# Patient Record
Sex: Male | Born: 1935 | Race: White | Hispanic: No | Marital: Married | Smoking: Former smoker
Health system: Southern US, Community
[De-identification: ages and names within clinical notes are randomized; demographics above are authoritative.]

## PROBLEM LIST (undated history)

## (undated) DIAGNOSIS — I2510 Atherosclerotic heart disease of native coronary artery without angina pectoris: Secondary | ICD-10-CM

## (undated) DIAGNOSIS — D696 Thrombocytopenia, unspecified: Secondary | ICD-10-CM

## (undated) DIAGNOSIS — J189 Pneumonia, unspecified organism: Secondary | ICD-10-CM

## (undated) DIAGNOSIS — E7800 Pure hypercholesterolemia, unspecified: Secondary | ICD-10-CM

## (undated) DIAGNOSIS — N1832 Chronic kidney disease, stage 3b: Secondary | ICD-10-CM

## (undated) DIAGNOSIS — K219 Gastro-esophageal reflux disease without esophagitis: Secondary | ICD-10-CM

## (undated) DIAGNOSIS — E1165 Type 2 diabetes mellitus with hyperglycemia: Secondary | ICD-10-CM

## (undated) DIAGNOSIS — R2689 Other abnormalities of gait and mobility: Secondary | ICD-10-CM

## (undated) DIAGNOSIS — IMO0001 Reserved for inherently not codable concepts without codable children: Secondary | ICD-10-CM

## (undated) DIAGNOSIS — K2960 Other gastritis without bleeding: Secondary | ICD-10-CM

## (undated) DIAGNOSIS — I10 Essential (primary) hypertension: Secondary | ICD-10-CM

## (undated) DIAGNOSIS — E46 Unspecified protein-calorie malnutrition: Secondary | ICD-10-CM

## (undated) DIAGNOSIS — R062 Wheezing: Secondary | ICD-10-CM

## (undated) DIAGNOSIS — K6389 Other specified diseases of intestine: Secondary | ICD-10-CM

## (undated) DIAGNOSIS — I219 Acute myocardial infarction, unspecified: Secondary | ICD-10-CM

## (undated) DIAGNOSIS — J449 Chronic obstructive pulmonary disease, unspecified: Secondary | ICD-10-CM

## (undated) DIAGNOSIS — E119 Type 2 diabetes mellitus without complications: Secondary | ICD-10-CM

## (undated) HISTORY — DX: Thrombocytopenia, unspecified: D69.6

## (undated) HISTORY — PX: CORONARY ANGIOPLASTY: SHX604

## (undated) HISTORY — PX: APPENDECTOMY: SHX54

## (undated) HISTORY — PX: COLONOSCOPY: SHX174

## (undated) HISTORY — PX: CARDIAC CATHETERIZATION: SHX172

## (undated) HISTORY — DX: Other specified diseases of intestine: K63.89

## (undated) HISTORY — DX: Type 2 diabetes mellitus with hyperosmolarity without nonketotic hyperglycemic-hyperosmolar coma (NKHHC): E11.65

## (undated) HISTORY — DX: Chronic kidney disease, stage 3b: N18.32

## (undated) HISTORY — DX: Other gastritis without bleeding: K29.60

## (undated) HISTORY — DX: Unspecified protein-calorie malnutrition: E46

---

## 2003-12-01 ENCOUNTER — Other Ambulatory Visit: Payer: Self-pay

## 2005-04-30 ENCOUNTER — Emergency Department: Payer: Self-pay | Admitting: General Practice

## 2005-05-01 ENCOUNTER — Emergency Department: Payer: Self-pay | Admitting: Emergency Medicine

## 2005-05-10 ENCOUNTER — Emergency Department: Payer: Self-pay | Admitting: Emergency Medicine

## 2005-05-15 ENCOUNTER — Emergency Department: Payer: Self-pay | Admitting: Emergency Medicine

## 2005-05-25 ENCOUNTER — Emergency Department: Payer: Self-pay | Admitting: Emergency Medicine

## 2007-04-05 ENCOUNTER — Emergency Department: Payer: Self-pay | Admitting: Emergency Medicine

## 2007-07-29 ENCOUNTER — Ambulatory Visit: Payer: Self-pay | Admitting: Gastroenterology

## 2008-09-15 ENCOUNTER — Ambulatory Visit: Payer: Self-pay | Admitting: Gastroenterology

## 2008-11-16 ENCOUNTER — Ambulatory Visit: Payer: Self-pay | Admitting: Gastroenterology

## 2012-10-22 DIAGNOSIS — J449 Chronic obstructive pulmonary disease, unspecified: Secondary | ICD-10-CM | POA: Insufficient documentation

## 2012-10-22 DIAGNOSIS — I10 Essential (primary) hypertension: Secondary | ICD-10-CM | POA: Insufficient documentation

## 2013-01-01 DIAGNOSIS — F1010 Alcohol abuse, uncomplicated: Secondary | ICD-10-CM | POA: Insufficient documentation

## 2015-05-26 DIAGNOSIS — K219 Gastro-esophageal reflux disease without esophagitis: Secondary | ICD-10-CM | POA: Insufficient documentation

## 2015-05-26 DIAGNOSIS — E118 Type 2 diabetes mellitus with unspecified complications: Secondary | ICD-10-CM

## 2015-05-26 DIAGNOSIS — E1129 Type 2 diabetes mellitus with other diabetic kidney complication: Secondary | ICD-10-CM | POA: Insufficient documentation

## 2015-05-26 DIAGNOSIS — IMO0002 Reserved for concepts with insufficient information to code with codable children: Secondary | ICD-10-CM | POA: Insufficient documentation

## 2015-09-28 ENCOUNTER — Encounter: Payer: Self-pay | Admitting: *Deleted

## 2015-10-04 ENCOUNTER — Ambulatory Visit: Payer: Medicare Other | Admitting: Anesthesiology

## 2015-10-04 ENCOUNTER — Encounter: Payer: Self-pay | Admitting: *Deleted

## 2015-10-04 ENCOUNTER — Encounter
Admission: RE | Disposition: A | Discharge status: Home / Self Care | Payer: Self-pay | Source: Ambulatory Visit | Attending: Ophthalmology

## 2015-10-04 ENCOUNTER — Ambulatory Visit
Admission: RE | Admit: 2015-10-04 | Discharge: 2015-10-04 | Disposition: A | Discharge status: Home / Self Care | Payer: Medicare Other | Source: Ambulatory Visit | Attending: Ophthalmology | Admitting: Ophthalmology

## 2015-10-04 DIAGNOSIS — Z79899 Other long term (current) drug therapy: Secondary | ICD-10-CM | POA: Insufficient documentation

## 2015-10-04 DIAGNOSIS — H2511 Age-related nuclear cataract, right eye: Secondary | ICD-10-CM | POA: Insufficient documentation

## 2015-10-04 DIAGNOSIS — E119 Type 2 diabetes mellitus without complications: Secondary | ICD-10-CM | POA: Diagnosis not present

## 2015-10-04 DIAGNOSIS — I252 Old myocardial infarction: Secondary | ICD-10-CM | POA: Diagnosis not present

## 2015-10-04 DIAGNOSIS — Z9889 Other specified postprocedural states: Secondary | ICD-10-CM | POA: Diagnosis not present

## 2015-10-04 DIAGNOSIS — J449 Chronic obstructive pulmonary disease, unspecified: Secondary | ICD-10-CM | POA: Insufficient documentation

## 2015-10-04 DIAGNOSIS — Z955 Presence of coronary angioplasty implant and graft: Secondary | ICD-10-CM | POA: Insufficient documentation

## 2015-10-04 DIAGNOSIS — Z9049 Acquired absence of other specified parts of digestive tract: Secondary | ICD-10-CM | POA: Diagnosis not present

## 2015-10-04 DIAGNOSIS — I10 Essential (primary) hypertension: Secondary | ICD-10-CM | POA: Insufficient documentation

## 2015-10-04 DIAGNOSIS — K219 Gastro-esophageal reflux disease without esophagitis: Secondary | ICD-10-CM | POA: Insufficient documentation

## 2015-10-04 DIAGNOSIS — E7800 Pure hypercholesterolemia, unspecified: Secondary | ICD-10-CM | POA: Insufficient documentation

## 2015-10-04 DIAGNOSIS — H269 Unspecified cataract: Secondary | ICD-10-CM | POA: Diagnosis present

## 2015-10-04 DIAGNOSIS — Z7984 Long term (current) use of oral hypoglycemic drugs: Secondary | ICD-10-CM | POA: Diagnosis not present

## 2015-10-04 DIAGNOSIS — F17200 Nicotine dependence, unspecified, uncomplicated: Secondary | ICD-10-CM | POA: Diagnosis not present

## 2015-10-04 HISTORY — PX: CATARACT EXTRACTION W/PHACO: SHX586

## 2015-10-04 HISTORY — DX: Wheezing: R06.2

## 2015-10-04 HISTORY — DX: Other abnormalities of gait and mobility: R26.89

## 2015-10-04 HISTORY — DX: Chronic obstructive pulmonary disease, unspecified: J44.9

## 2015-10-04 HISTORY — DX: Type 2 diabetes mellitus without complications: E11.9

## 2015-10-04 HISTORY — DX: Acute myocardial infarction, unspecified: I21.9

## 2015-10-04 HISTORY — DX: Atherosclerotic heart disease of native coronary artery without angina pectoris: I25.10

## 2015-10-04 HISTORY — DX: Reserved for inherently not codable concepts without codable children: IMO0001

## 2015-10-04 HISTORY — DX: Gastro-esophageal reflux disease without esophagitis: K21.9

## 2015-10-04 LAB — GLUCOSE, CAPILLARY: Glucose-Capillary: 150 mg/dL — ABNORMAL HIGH (ref 65–99)

## 2015-10-04 SURGERY — PHACOEMULSIFICATION, CATARACT, WITH IOL INSERTION
Anesthesia: Monitor Anesthesia Care | Site: Eye | Laterality: Right | Wound class: Clean

## 2015-10-04 MED ORDER — FENTANYL CITRATE (PF) 100 MCG/2ML IJ SOLN
INTRAMUSCULAR | Status: DC | PRN
  Administered 2015-10-04: 50 ug via INTRAVENOUS

## 2015-10-04 MED ORDER — LIDOCAINE HCL (PF) 1 % IJ SOLN
INTRAOCULAR | Status: DC | PRN
  Administered 2015-10-04: .5 mL via OPHTHALMIC

## 2015-10-04 MED ORDER — CARBACHOL 0.01 % IO SOLN
INTRAOCULAR | Status: DC | PRN
  Administered 2015-10-04: .5 mL via INTRAOCULAR

## 2015-10-04 MED ORDER — MOXIFLOXACIN HCL 0.5 % OP SOLN
OPHTHALMIC | Status: AC
  Filled 2015-10-04: qty 3

## 2015-10-04 MED ORDER — LIDOCAINE HCL (PF) 4 % IJ SOLN
INTRAMUSCULAR | Status: AC
  Filled 2015-10-04: qty 5

## 2015-10-04 MED ORDER — SODIUM CHLORIDE 0.9 % IV SOLN
INTRAVENOUS | Status: DC
  Administered 2015-10-04 (×2): via INTRAVENOUS

## 2015-10-04 MED ORDER — POVIDONE-IODINE 5 % OP SOLN
1.0000 "application " | Freq: Once | OPHTHALMIC | Status: AC
  Administered 2015-10-04: 1 via OPHTHALMIC

## 2015-10-04 MED ORDER — CEFUROXIME OPHTHALMIC INJECTION 1 MG/0.1 ML
INJECTION | OPHTHALMIC | Status: DC | PRN
  Administered 2015-10-04: .1 mL via INTRACAMERAL

## 2015-10-04 MED ORDER — ARMC OPHTHALMIC DILATING GEL
1.0000 "application " | OPHTHALMIC | Status: AC | PRN
  Administered 2015-10-04 (×2): 1 via OPHTHALMIC

## 2015-10-04 MED ORDER — PHENYLEPHRINE HCL 10 % OP SOLN
OPHTHALMIC | Status: AC
  Administered 2015-10-04: 1 [drp] via OPHTHALMIC
  Filled 2015-10-04: qty 5

## 2015-10-04 MED ORDER — ARMC OPHTHALMIC DILATING GEL
OPHTHALMIC | Status: AC
  Administered 2015-10-04: 1 via OPHTHALMIC
  Filled 2015-10-04: qty 0.25

## 2015-10-04 MED ORDER — TETRACAINE HCL 0.5 % OP SOLN: 1.0000 [drp] | Freq: Once | OPHTHALMIC | Status: DC

## 2015-10-04 MED ORDER — EPINEPHRINE HCL 1 MG/ML IJ SOLN
INTRAMUSCULAR | Status: AC
  Filled 2015-10-04: qty 2

## 2015-10-04 MED ORDER — EPINEPHRINE HCL 1 MG/ML IJ SOLN
INTRAOCULAR | Status: DC | PRN
  Administered 2015-10-04: 1 mL via OPHTHALMIC

## 2015-10-04 MED ORDER — MIDAZOLAM HCL 2 MG/2ML IJ SOLN
INTRAMUSCULAR | Status: DC | PRN
  Administered 2015-10-04: 1 mg via INTRAVENOUS

## 2015-10-04 MED ORDER — CEFUROXIME OPHTHALMIC INJECTION 1 MG/0.1 ML
INJECTION | OPHTHALMIC | Status: AC
  Filled 2015-10-04: qty 0.1

## 2015-10-04 MED ORDER — NA CHONDROIT SULF-NA HYALURON 40-17 MG/ML IO SOLN
INTRAOCULAR | Status: DC | PRN
  Administered 2015-10-04: 1 mL via INTRAOCULAR

## 2015-10-04 MED ORDER — NA CHONDROIT SULF-NA HYALURON 40-17 MG/ML IO SOLN
INTRAOCULAR | Status: AC
  Filled 2015-10-04: qty 1

## 2015-10-04 MED ORDER — MOXIFLOXACIN HCL 0.5 % OP SOLN
1.0000 [drp] | OPHTHALMIC | Status: DC | PRN
Start: 2015-10-04 — End: 2015-10-04

## 2015-10-04 MED ORDER — POVIDONE-IODINE 5 % OP SOLN
OPHTHALMIC | Status: AC
  Administered 2015-10-04: 1 via OPHTHALMIC
  Filled 2015-10-04: qty 30

## 2015-10-04 MED ORDER — TETRACAINE HCL 0.5 % OP SOLN
OPHTHALMIC | Status: AC
  Filled 2015-10-04: qty 2

## 2015-10-04 MED ORDER — PHENYLEPHRINE HCL 10 % OP SOLN
1.0000 [drp] | Freq: Once | OPHTHALMIC | Status: AC
  Administered 2015-10-04: 1 [drp] via OPHTHALMIC

## 2015-10-04 MED ORDER — MOXIFLOXACIN HCL 0.5 % OP SOLN
OPHTHALMIC | Status: DC | PRN
  Administered 2015-10-04: 1 [drp] via OPHTHALMIC

## 2015-10-04 SURGICAL SUPPLY — 21 items
CANNULA ANT/CHMB 27GA (MISCELLANEOUS) ×6 IMPLANT
CUP MEDICINE 2OZ PLAST GRAD ST (MISCELLANEOUS) ×6 IMPLANT
GLOVE BIO SURGEON STRL SZ8 (GLOVE) ×3 IMPLANT
GLOVE BIOGEL M 6.5 STRL (GLOVE) ×3 IMPLANT
GLOVE SURG LX 8.0 MICRO (GLOVE) ×2
GLOVE SURG LX STRL 8.0 MICRO (GLOVE) ×1 IMPLANT
GOWN STRL REUS W/ TWL LRG LVL3 (GOWN DISPOSABLE) ×2 IMPLANT
GOWN STRL REUS W/TWL LRG LVL3 (GOWN DISPOSABLE) ×4
LENS IOL TECNIS ITEC 25.0 (Intraocular Lens) ×3 IMPLANT
PACK CATARACT (MISCELLANEOUS) ×3 IMPLANT
PACK CATARACT BRASINGTON LX (MISCELLANEOUS) ×3 IMPLANT
PACK EYE AFTER SURG (MISCELLANEOUS) ×3 IMPLANT
SOL BSS BAG (MISCELLANEOUS) ×3
SOL PREP PVP 2OZ (MISCELLANEOUS) ×3
SOLUTION BSS BAG (MISCELLANEOUS) ×1 IMPLANT
SOLUTION PREP PVP 2OZ (MISCELLANEOUS) ×1 IMPLANT
SYR 3ML LL SCALE MARK (SYRINGE) ×6 IMPLANT
SYR 5ML LL (SYRINGE) ×3 IMPLANT
SYR TB 1ML 27GX1/2 LL (SYRINGE) ×3 IMPLANT
WATER STERILE IRR 1000ML POUR (IV SOLUTION) ×3 IMPLANT
WIPE NON LINTING 3.25X3.25 (MISCELLANEOUS) ×3 IMPLANT

## 2015-10-04 NOTE — Anesthesia Postprocedure Evaluation (Signed)
Anesthesia Post Note  Patient: Micheal [name]  Procedure(s) Performed: Procedure(s) (LRB): CATARACT EXTRACTION PHACO AND INTRAOCULAR LENS PLACEMENT (IOC) (Right)  Patient location during evaluation: PACU Anesthesia Type: MAC Level of consciousness: awake, awake and alert and oriented Pain management: pain level controlled Vital Signs Assessment: post-procedure vital signs reviewed and stable Respiratory status: spontaneous breathing, nonlabored ventilation and respiratory function stable Cardiovascular status: blood pressure returned to baseline Anesthetic complications: no    Last Vitals:  Filed Vitals:   10/04/15 1047  BP: 153/58  Pulse: 72  Temp: 36.4 C  Resp: 18    Last Pain: There were no vitals filed for this visit.               Anysa Tacey,  [employer]

## 2015-10-04 NOTE — Anesthesia Preprocedure Evaluation (Addendum)
Anesthesia Evaluation  Patient identified by MRN, date of birth, ID band Patient awake    Reviewed: Allergy & Precautions, NPO status , Patient's Chart, lab work & pertinent test results, reviewed documented beta blocker date and time   Airway Mallampati: II  TM Distance: >3 FB     Dental  (+) Chipped, Upper Dentures, Partial Lower, Poor Dentition, Dental Advisory Given   Pulmonary shortness of breath, COPD, Current Smoker,           Cardiovascular + CAD, + Past MI and + Cardiac Stents       Neuro/Psych    GI/Hepatic GERD  Controlled,  Endo/Other  diabetes, Type 2  Renal/GU      Musculoskeletal   Abdominal   Peds  Hematology   Anesthesia Other Findings   Reproductive/Obstetrics                            Anesthesia Physical Anesthesia Plan  ASA: III  Anesthesia Plan: MAC   Post-op Pain Management:    Induction:   Airway Management Planned:   Additional Equipment:   Intra-op Plan:   Post-operative Plan:   Informed Consent: I have reviewed the patients History and Physical, chart, labs and discussed the procedure including the risks, benefits and alternatives for the proposed anesthesia with the patient or authorized representative who has indicated his/her understanding and acceptance.     Plan Discussed with: CRNA  Anesthesia Plan Comments:         Anesthesia Quick Evaluation

## 2015-10-04 NOTE — Discharge Instructions (Signed)
Eye Surgery Discharge Instructions  Expect mild scratchy sensation or mild soreness. DO NOT RUB YOUR EYE!  The day of surgery:  Minimal physical activity, but bed rest is not required  No reading, computer work, or close hand work  No bending, lifting, or straining.  May watch TV  For 24 hours:  No driving, legal decisions, or alcoholic beverages  Safety precautions  Eat anything you prefer: It is better to start with liquids, then soup then solid foods.  _____ Eye patch should be worn until postoperative exam tomorrow.  ____ Solar shield eyeglasses should be worn for comfort in the sunlight/patch while sleeping  Resume all regular medications including aspirin or Coumadin if these were discontinued prior to surgery. You may shower, bathe, shave, or wash your hair. Tylenol may be taken for mild discomfort.  Call your doctor if you experience significant pain, nausea, or vomiting, fever > 101 or other signs of infection. [phone] or [phone] Specific instructions:  Follow-up Information    Follow up with Tim Lair, MD.   Specialty:  Ophthalmology   Why:  10-05-15 at 9:20   Contact information:   [address] [place] [address] [phone]      Eye Surgery Discharge Instructions  Expect mild scratchy sensation or mild soreness. DO NOT RUB YOUR EYE!  The day of surgery:  Minimal physical activity, but bed rest is not required  No reading, computer work, or close hand work  No bending, lifting, or straining.  May watch TV  For 24 hours:  No driving, legal decisions, or alcoholic beverages  Safety precautions  Eat anything you prefer: It is better to start with liquids, then soup then solid foods.  _____ Eye patch should be worn until postoperative exam tomorrow.  ____ Solar shield eyeglasses should be worn for comfort in the sunlight/patch while sleeping  Resume all regular medications including aspirin or Coumadin if  these were discontinued prior to surgery. You may shower, bathe, shave, or wash your hair. Tylenol may be taken for mild discomfort.  Call your doctor if you experience significant pain, nausea, or vomiting, fever > 101 or other signs of infection. [phone] or [phone] Specific instructions:  Follow-up Information    Follow up with Tim Lair, MD.   Specialty:  Ophthalmology   Why:  10-05-15 at 9:20   Contact information:   [address] [place] [address] [phone]

## 2015-10-04 NOTE — Op Note (Signed)
PREOPERATIVE DIAGNOSIS:  Nuclear sclerotic cataract of the right eye.   POSTOPERATIVE DIAGNOSIS: nuclear sclerotic cataract right eye   OPERATIVE PROCEDURE:  Procedure(s): CATARACT EXTRACTION PHACO AND INTRAOCULAR LENS PLACEMENT (IOC)   SURGEON:  [name], MD.   ANESTHESIA:  Anesthesiologist: Gunnar Bulla, MD CRNA: Nelda Marseille, CRNA  1.      Managed anesthesia care. 2.      Topical tetracaine drops followed by 2% Xylocaine jelly applied in the preoperative holding area.       3.  0.2 ml of epi-Shugarcaine was  placed in the anterior chamber following the paracentesis.    COMPLICATIONS:  None.   TECHNIQUE:   Stop and chop   DESCRIPTION OF PROCEDURE:  The patient was examined and consented in the preoperative holding area where the aforementioned topical anesthesia was applied to the right eye and then brought back to the Operating Room where the right eye was prepped and draped in the usual sterile ophthalmic fashion and a lid speculum was placed. A paracentesis was created with the side port blade and the anterior chamber was filled with viscoelastic. A near clear corneal incision was performed with the steel keratome. A continuous curvilinear capsulorrhexis was performed with a cystotome followed by the capsulorrhexis forceps. Hydrodissection and hydrodelineation were carried out with BSS on a blunt cannula. The lens was removed in a stop and chop  technique and the remaining cortical material was removed with the irrigation-aspiration handpiece. The capsular bag was inflated with viscoelastic and the Technis ZCB00  lens was placed in the capsular bag without complication. The remaining viscoelastic was removed from the eye with the irrigation-aspiration handpiece. The wounds were hydrated. The anterior chamber was flushed with Miostat and the eye was inflated to physiologic pressure. 0.1 mL of cefuroxime concentration 10 mg/mL was placed in the anterior chamber. The wounds were found  to be water tight. The eye was dressed with Vigamox. The patient was given protective glasses to wear throughout the day and a shield with which to sleep tonight. The patient was also given drops with which to begin a drop regimen today and will follow-up with me in one day.  Implant Name Type Inv. Item Serial No. Manufacturer Lot No. LRB No. Used  LENS IOL DIOP 25.0 - [id number] Intraocular Lens LENS IOL DIOP 25.0 [id number]   Right 1   Procedure(s) with comments: CATARACT EXTRACTION PHACO AND INTRAOCULAR LENS PLACEMENT (IOC) (Right) - [place] 02:06 AP% 17.9 CDE 22.65 fluid pack lot # [id number]  Electronically signed: [place] 10/04/2015 12:02 PM

## 2015-10-04 NOTE — Transfer of Care (Signed)
Immediate Anesthesia Transfer of Care Note  Patient: Micheal [name]  Procedure(s) Performed: Procedure(s) with comments: CATARACT EXTRACTION PHACO AND INTRAOCULAR LENS PLACEMENT (IOC) (Right) - [place] 02:06 AP% 17.9 CDE 22.65 fluid pack lot # [id number]  Patient Location: PACU  Anesthesia Type:MAC  Level of Consciousness: awake, alert  and oriented  Airway & Oxygen Therapy: Patient Spontanous Breathing  Post-op Assessment: Report given to RN and Post -op Vital signs reviewed and stable  Post vital signs: Reviewed and stable  Last Vitals:  Filed Vitals:   10/04/15 1047  BP: 153/58  Pulse: 72  Temp: 36.4 C  Resp: 18    Last Pain: There were no vitals filed for this visit.       Complications: No apparent anesthesia complications

## 2015-10-04 NOTE — Anesthesia Procedure Notes (Signed)
Procedure Name: MAC Date/Time: 10/04/2015 11:35 AM Performed by: Nelda Marseille Pre-anesthesia Checklist: Patient identified, Emergency Drugs available, Suction available, Patient being monitored and Timeout performed Oxygen Delivery Method: Nasal cannula

## 2015-10-04 NOTE — H&P (Signed)
  All labs reviewed. Abnormal studies sent to patients PCP when indicated.  Previous H&P reviewed, patient examined, there are NO CHANGES.  Micheal [name] LOUIS5/16/201711:04 AM

## 2015-11-08 ENCOUNTER — Encounter: Admission: RE | Payer: Self-pay | Source: Ambulatory Visit

## 2015-11-08 ENCOUNTER — Ambulatory Visit: Admission: RE | Admit: 2015-11-08 | Payer: Medicare Other | Source: Ambulatory Visit | Admitting: Ophthalmology

## 2015-11-08 HISTORY — DX: Essential (primary) hypertension: I10

## 2015-11-08 HISTORY — DX: Pure hypercholesterolemia, unspecified: E78.00

## 2015-11-08 SURGERY — PHACOEMULSIFICATION, CATARACT, WITH IOL INSERTION
Anesthesia: Choice | Laterality: Left

## 2015-12-05 ENCOUNTER — Encounter: Payer: Self-pay | Admitting: *Deleted

## 2015-12-13 ENCOUNTER — Encounter
Admission: RE | Disposition: A | Discharge status: Home / Self Care | Payer: Self-pay | Source: Ambulatory Visit | Attending: Ophthalmology

## 2015-12-13 ENCOUNTER — Encounter: Payer: Self-pay | Admitting: *Deleted

## 2015-12-13 ENCOUNTER — Ambulatory Visit
Admission: RE | Admit: 2015-12-13 | Discharge: 2015-12-13 | Disposition: A | Discharge status: Home / Self Care | Payer: Medicare Other | Source: Ambulatory Visit | Attending: Ophthalmology | Admitting: Ophthalmology

## 2015-12-13 ENCOUNTER — Ambulatory Visit: Payer: Medicare Other | Admitting: Certified Registered"

## 2015-12-13 DIAGNOSIS — E119 Type 2 diabetes mellitus without complications: Secondary | ICD-10-CM | POA: Insufficient documentation

## 2015-12-13 DIAGNOSIS — Z87891 Personal history of nicotine dependence: Secondary | ICD-10-CM | POA: Diagnosis not present

## 2015-12-13 DIAGNOSIS — J449 Chronic obstructive pulmonary disease, unspecified: Secondary | ICD-10-CM | POA: Diagnosis not present

## 2015-12-13 DIAGNOSIS — I252 Old myocardial infarction: Secondary | ICD-10-CM | POA: Diagnosis not present

## 2015-12-13 DIAGNOSIS — H2512 Age-related nuclear cataract, left eye: Secondary | ICD-10-CM | POA: Insufficient documentation

## 2015-12-13 DIAGNOSIS — I10 Essential (primary) hypertension: Secondary | ICD-10-CM | POA: Insufficient documentation

## 2015-12-13 DIAGNOSIS — Z955 Presence of coronary angioplasty implant and graft: Secondary | ICD-10-CM | POA: Insufficient documentation

## 2015-12-13 DIAGNOSIS — K219 Gastro-esophageal reflux disease without esophagitis: Secondary | ICD-10-CM | POA: Diagnosis not present

## 2015-12-13 HISTORY — PX: CATARACT EXTRACTION W/PHACO: SHX586

## 2015-12-13 HISTORY — DX: Pneumonia, unspecified organism: J18.9

## 2015-12-13 LAB — GLUCOSE, CAPILLARY: GLUCOSE-CAPILLARY: 192 mg/dL — AB (ref 65–99)

## 2015-12-13 SURGERY — PHACOEMULSIFICATION, CATARACT, WITH IOL INSERTION
Anesthesia: Monitor Anesthesia Care | Site: Eye | Laterality: Left | Wound class: Clean

## 2015-12-13 MED ORDER — NA CHONDROIT SULF-NA HYALURON 40-17 MG/ML IO SOLN
INTRAOCULAR | Status: AC
  Filled 2015-12-13: qty 1

## 2015-12-13 MED ORDER — SODIUM CHLORIDE 0.9 % IV SOLN
INTRAVENOUS | Status: DC
  Administered 2015-12-13: 15:00:00 via INTRAVENOUS

## 2015-12-13 MED ORDER — BSS IO SOLN
INTRAOCULAR | Status: DC | PRN
  Administered 2015-12-13: 16:00:00 via OPHTHALMIC

## 2015-12-13 MED ORDER — ARMC OPHTHALMIC DILATING GEL
OPHTHALMIC | Status: AC
  Filled 2015-12-13: qty 0.25

## 2015-12-13 MED ORDER — NA CHONDROIT SULF-NA HYALURON 40-17 MG/ML IO SOLN
INTRAOCULAR | Status: DC | PRN
  Administered 2015-12-13: 1 mL via INTRAOCULAR

## 2015-12-13 MED ORDER — EPINEPHRINE HCL 1 MG/ML IJ SOLN
INTRAMUSCULAR | Status: AC
  Filled 2015-12-13: qty 1

## 2015-12-13 MED ORDER — TETRACAINE HCL 0.5 % OP SOLN
1.0000 [drp] | OPHTHALMIC | Status: AC | PRN
  Administered 2015-12-13: 1 [drp] via OPHTHALMIC

## 2015-12-13 MED ORDER — LIDOCAINE HCL (PF) 4 % IJ SOLN
INTRAMUSCULAR | Status: AC
  Filled 2015-12-13: qty 10

## 2015-12-13 MED ORDER — ARMC OPHTHALMIC DILATING GEL
1.0000 "application " | OPHTHALMIC | Status: AC | PRN
  Administered 2015-12-13 (×2): 1 via OPHTHALMIC

## 2015-12-13 MED ORDER — POVIDONE-IODINE 5 % OP SOLN
1.0000 "application " | OPHTHALMIC | Status: AC | PRN
  Administered 2015-12-13: 1 via OPHTHALMIC

## 2015-12-13 MED ORDER — CARBACHOL 0.01 % IO SOLN
INTRAOCULAR | Status: DC | PRN
  Administered 2015-12-13: 0.5 mL via INTRAOCULAR

## 2015-12-13 MED ORDER — MOXIFLOXACIN HCL 0.5 % OP SOLN
OPHTHALMIC | Status: AC
  Filled 2015-12-13: qty 3

## 2015-12-13 MED ORDER — TETRACAINE HCL 0.5 % OP SOLN
OPHTHALMIC | Status: AC
  Filled 2015-12-13: qty 2

## 2015-12-13 MED ORDER — FENTANYL CITRATE (PF) 100 MCG/2ML IJ SOLN: 25.0000 ug | INTRAMUSCULAR | Status: DC | PRN

## 2015-12-13 MED ORDER — MIDAZOLAM HCL 2 MG/2ML IJ SOLN
INTRAMUSCULAR | Status: DC | PRN
  Administered 2015-12-13: 1 mg via INTRAVENOUS

## 2015-12-13 MED ORDER — POVIDONE-IODINE 5 % OP SOLN
OPHTHALMIC | Status: AC
  Filled 2015-12-13: qty 30

## 2015-12-13 MED ORDER — ONDANSETRON HCL 4 MG/2ML IJ SOLN: 4.0000 mg | Freq: Once | INTRAMUSCULAR | Status: DC | PRN

## 2015-12-13 MED ORDER — MOXIFLOXACIN HCL 0.5 % OP SOLN
1.0000 [drp] | OPHTHALMIC | Status: AC | PRN
  Administered 2015-12-13: 1 [drp] via OPHTHALMIC
  Filled 2015-12-13: qty 3

## 2015-12-13 MED ORDER — EPINEPHRINE HCL 1 MG/ML IJ SOLN
INTRAMUSCULAR | Status: AC
  Filled 2015-12-13: qty 2

## 2015-12-13 MED ORDER — CEFUROXIME OPHTHALMIC INJECTION 1 MG/0.1 ML
INJECTION | OPHTHALMIC | Status: DC | PRN
  Administered 2015-12-13: 0.1 mL via INTRACAMERAL

## 2015-12-13 MED ORDER — SODIUM CHLORIDE 0.9 % IV SOLN: INTRAVENOUS | Status: DC

## 2015-12-13 MED ORDER — CEFUROXIME OPHTHALMIC INJECTION 1 MG/0.1 ML
INJECTION | OPHTHALMIC | Status: AC
  Filled 2015-12-13: qty 0.1

## 2015-12-13 SURGICAL SUPPLY — 23 items
CANNULA ANT/CHMB 27GA (MISCELLANEOUS) ×3 IMPLANT
CUP MEDICINE 2OZ PLAST GRAD ST (MISCELLANEOUS) ×3 IMPLANT
GLOVE BIO SURGEON STRL SZ8 (GLOVE) ×3 IMPLANT
GLOVE BIOGEL M 6.5 STRL (GLOVE) ×3 IMPLANT
GLOVE SURG LX 8.0 MICRO (GLOVE) ×2
GLOVE SURG LX STRL 8.0 MICRO (GLOVE) ×1 IMPLANT
GOWN STRL REUS W/ TWL LRG LVL3 (GOWN DISPOSABLE) ×2 IMPLANT
GOWN STRL REUS W/TWL LRG LVL3 (GOWN DISPOSABLE) ×4
LENS IOL ACRSF IQ ULTRA 24.0 (Intraocular Lens) ×1 IMPLANT
LENS IOL ACRYSOF IQ 24.0 (Intraocular Lens) ×3 IMPLANT
PACK CATARACT (MISCELLANEOUS) ×3 IMPLANT
PACK CATARACT BRASINGTON LX (MISCELLANEOUS) ×3 IMPLANT
PACK EYE AFTER SURG (MISCELLANEOUS) ×3 IMPLANT
RING MALYGIN (MISCELLANEOUS) ×3 IMPLANT
SOL BSS BAG (MISCELLANEOUS) ×3
SOL PREP PVP 2OZ (MISCELLANEOUS) ×3
SOLUTION BSS BAG (MISCELLANEOUS) ×1 IMPLANT
SOLUTION PREP PVP 2OZ (MISCELLANEOUS) ×1 IMPLANT
SYR 3ML LL SCALE MARK (SYRINGE) ×3 IMPLANT
SYR 5ML LL (SYRINGE) ×3 IMPLANT
SYR TB 1ML 27GX1/2 LL (SYRINGE) ×3 IMPLANT
WATER STERILE IRR 1000ML POUR (IV SOLUTION) ×3 IMPLANT
WIPE NON LINTING 3.25X3.25 (MISCELLANEOUS) ×3 IMPLANT

## 2015-12-13 NOTE — H&P (Signed)
  All labs reviewed. Abnormal studies sent to patients PCP when indicated.  Previous H&P reviewed, patient examined, there are NO CHANGES.  Micheal [name] LOUIS7/25/20174:12 PM

## 2015-12-13 NOTE — Op Note (Signed)
PREOPERATIVE DIAGNOSIS:  Nuclear sclerotic cataract of the left eye.   POSTOPERATIVE DIAGNOSIS:  NUCLEAR SCLERTOIC CATARACT LEFT EYE   OPERATIVE PROCEDURE:  Procedure(s): CATARACT EXTRACTION PHACO AND INTRAOCULAR LENS PLACEMENT (IOC)   SURGEON:  [name], MD.   ANESTHESIA:   Anesthesiologist: Gijsbertus Lonia Mad, MD CRNA: Rolla Plate, CRNA; Nelda Marseille, CRNA  1.      Managed anesthesia care. 2.      Topical tetracaine drops followed by 2% Xylocaine jelly applied in the preoperative holding area.   COMPLICATIONS: Viscoelastic was used to raise the pupil margin.  A  Malyugin ring was placed as the pupil would not achieve sufficient pharmacologic dilation to undergo cataract extraction safely.( The ring was removed atraumatically following insertion of the IOL.)   TECHNIQUE:   Stop and chop   DESCRIPTION OF PROCEDURE:  The patient was examined and consented in the preoperative holding area where the aforementioned topical anesthesia was applied to the left eye and then brought back to the Operating Room where the left eye was prepped and draped in the usual sterile ophthalmic fashion and a lid speculum was placed. A paracentesis was created with the side port blade and the anterior chamber was filled with viscoelastic. A near clear corneal incision was performed with the steel keratome. A continuous curvilinear capsulorrhexis was performed with a cystotome followed by the capsulorrhexis forceps. Hydrodissection and hydrodelineation were carried out with BSS on a blunt cannula. The lens was removed in a stop and chop  technique and the remaining cortical material was removed with the irrigation-aspiration handpiece. The capsular bag was inflated with viscoelastic and the Technis [id number] lens was placed in the capsular bag without complication. The remaining viscoelastic was removed from the eye with the irrigation-aspiration handpiece. The wounds were hydrated. The anterior chamber was  flushed with Miostat and the eye was inflated to physiologic pressure. 0.1 mL of cefuroxime concentration 10 mg/mL was placed in the anterior chamber. The wounds were found to be water tight. The eye was dressed with Vigamox. The patient was given protective glasses to wear throughout the day and a shield with which to sleep tonight. The patient was also given drops with which to begin a drop regimen today and will follow-up with me in one day.  Implant Name Type Inv. Item Serial No. Manufacturer Lot No. LRB No. Used  LENS IOL ACRYSOF IQ 24.0 - [id number] Intraocular Lens LENS IOL ACRYSOF IQ 24.80 [id number] ALCON   Left 1   Procedure(s) with comments: CATARACT EXTRACTION PHACO AND INTRAOCULAR LENS PLACEMENT (IOC) (Left) - [place] 1.39 AP% 25.3 CDE 26.37 Fluid pack lot # [id number]  Electronically signed: [place] 12/13/2015 4:44 PM

## 2015-12-13 NOTE — Anesthesia Postprocedure Evaluation (Signed)
Anesthesia Post Note  Patient: Micheal [name]  Procedure(s) Performed: Procedure(s) (LRB): CATARACT EXTRACTION PHACO AND INTRAOCULAR LENS PLACEMENT (IOC) (Left)  Patient location during evaluation: Short Stay Anesthesia Type: MAC Level of consciousness: awake Pain management: pain level controlled Vital Signs Assessment: post-procedure vital signs reviewed and stable Respiratory status: spontaneous breathing Cardiovascular status: blood pressure returned to baseline Postop Assessment: no headache Anesthetic complications: no    Last Vitals:  Vitals:   12/13/15 1427 12/13/15 1646  BP: 92/69 129/61  Pulse: 84 73  Resp: 16 18  Temp: 36.7 C 36.6 C    Last Pain:  Vitals:   12/13/15 1427  TempSrc: Oral                 Brantley Fling

## 2015-12-13 NOTE — Anesthesia Preprocedure Evaluation (Signed)
Anesthesia Evaluation  Patient identified by MRN, date of birth, ID band Patient awake    Reviewed: Allergy & Precautions, NPO status , Patient's Chart, lab work & pertinent test results  Airway Mallampati: II       Dental  (+) Caps   Pulmonary COPD, former smoker,     + decreased breath sounds      Cardiovascular Exercise Tolerance: Good hypertension, + Past MI and + Cardiac Stents   Rhythm:Regular     Neuro/Psych negative neurological ROS     GI/Hepatic Neg liver ROS, GERD  ,  Endo/Other  diabetes, Type 2, Oral Hypoglycemic Agents  Renal/GU negative Renal ROS     Musculoskeletal   Abdominal Normal abdominal exam  (+)   Peds  Hematology   Anesthesia Other Findings   Reproductive/Obstetrics                             Anesthesia Physical Anesthesia Plan  ASA: III  Anesthesia Plan: MAC   Post-op Pain Management:    Induction: Intravenous  Airway Management Planned: Natural Airway and Nasal Cannula  Additional Equipment:   Intra-op Plan:   Post-operative Plan:   Informed Consent: I have reviewed the patients History and Physical, chart, labs and discussed the procedure including the risks, benefits and alternatives for the proposed anesthesia with the patient or authorized representative who has indicated his/her understanding and acceptance.     Plan Discussed with: CRNA  Anesthesia Plan Comments:         Anesthesia Quick Evaluation

## 2015-12-13 NOTE — OR Nursing (Signed)
Iv dc'd with cath intact.  No redness

## 2015-12-13 NOTE — Transfer of Care (Signed)
Immediate Anesthesia Transfer of Care Note  Patient: Micheal [name]  Procedure(s) Performed: Procedure(s) with comments: CATARACT EXTRACTION PHACO AND INTRAOCULAR LENS PLACEMENT (IOC) (Left) - [place] 1.39 AP% 25.3 CDE 26.37 Fluid pack lot # [id number]  Patient Location: Short Stay  Anesthesia Type:MAC  Level of Consciousness: awake  Airway & Oxygen Therapy: Patient Spontanous Breathing  Post-op Assessment: Report given to RN  Post vital signs: Reviewed  Last Vitals:  Vitals:   12/13/15 1427 12/13/15 1646  BP: 92/69 129/61  Pulse: 84 73  Resp: 16 18  Temp: 36.7 C 36.6 C    Last Pain:  Vitals:   12/13/15 1427  TempSrc: Oral         Complications: No apparent anesthesia complications

## 2015-12-13 NOTE — Discharge Instructions (Signed)

## 2015-12-13 NOTE — OR Nursing (Signed)
Iv in right hand with out redness or drainage.  Infusing NS

## 2015-12-15 ENCOUNTER — Encounter: Payer: Self-pay | Admitting: Ophthalmology

## 2016-05-05 ENCOUNTER — Emergency Department: Payer: Medicare Other

## 2016-05-05 ENCOUNTER — Emergency Department
Admission: EM | Admit: 2016-05-05 | Discharge: 2016-05-05 | Disposition: A | Discharge status: Home / Self Care | Payer: Medicare Other | Attending: Student in an Organized Health Care Education/Training Program | Admitting: Student in an Organized Health Care Education/Training Program

## 2016-05-05 DIAGNOSIS — S60512A Abrasion of left hand, initial encounter: Secondary | ICD-10-CM | POA: Diagnosis not present

## 2016-05-05 DIAGNOSIS — J449 Chronic obstructive pulmonary disease, unspecified: Secondary | ICD-10-CM | POA: Diagnosis not present

## 2016-05-05 DIAGNOSIS — S022XXA Fracture of nasal bones, initial encounter for closed fracture: Secondary | ICD-10-CM | POA: Diagnosis not present

## 2016-05-05 DIAGNOSIS — I2510 Atherosclerotic heart disease of native coronary artery without angina pectoris: Secondary | ICD-10-CM | POA: Diagnosis not present

## 2016-05-05 DIAGNOSIS — W010XXA Fall on same level from slipping, tripping and stumbling without subsequent striking against object, initial encounter: Secondary | ICD-10-CM | POA: Diagnosis not present

## 2016-05-05 DIAGNOSIS — S0992XA Unspecified injury of nose, initial encounter: Secondary | ICD-10-CM | POA: Diagnosis present

## 2016-05-05 DIAGNOSIS — S161XXA Strain of muscle, fascia and tendon at neck level, initial encounter: Secondary | ICD-10-CM | POA: Diagnosis not present

## 2016-05-05 DIAGNOSIS — Y939 Activity, unspecified: Secondary | ICD-10-CM | POA: Diagnosis not present

## 2016-05-05 DIAGNOSIS — W19XXXA Unspecified fall, initial encounter: Secondary | ICD-10-CM

## 2016-05-05 DIAGNOSIS — S0083XA Contusion of other part of head, initial encounter: Secondary | ICD-10-CM | POA: Diagnosis not present

## 2016-05-05 DIAGNOSIS — Z7984 Long term (current) use of oral hypoglycemic drugs: Secondary | ICD-10-CM | POA: Insufficient documentation

## 2016-05-05 DIAGNOSIS — Y999 Unspecified external cause status: Secondary | ICD-10-CM | POA: Diagnosis not present

## 2016-05-05 DIAGNOSIS — Z87891 Personal history of nicotine dependence: Secondary | ICD-10-CM | POA: Insufficient documentation

## 2016-05-05 DIAGNOSIS — T07XXXA Unspecified multiple injuries, initial encounter: Secondary | ICD-10-CM

## 2016-05-05 DIAGNOSIS — I10 Essential (primary) hypertension: Secondary | ICD-10-CM | POA: Insufficient documentation

## 2016-05-05 DIAGNOSIS — S40811A Abrasion of right upper arm, initial encounter: Secondary | ICD-10-CM | POA: Insufficient documentation

## 2016-05-05 DIAGNOSIS — E119 Type 2 diabetes mellitus without complications: Secondary | ICD-10-CM | POA: Diagnosis not present

## 2016-05-05 DIAGNOSIS — Y929 Unspecified place or not applicable: Secondary | ICD-10-CM | POA: Insufficient documentation

## 2016-05-05 MED ORDER — HYDROCODONE-ACETAMINOPHEN 5-325 MG PO TABS
1.0000 | ORAL_TABLET | Freq: Once | ORAL | Status: AC
Start: 2016-05-05 — End: 2016-05-05
  Administered 2016-05-05: 1 via ORAL
  Filled 2016-05-05: qty 1

## 2016-05-05 MED ORDER — HYDROCODONE-ACETAMINOPHEN 5-325 MG PO TABS: 1.0000 | ORAL_TABLET | ORAL | 0 refills | Status: DC | PRN

## 2016-05-05 NOTE — Discharge Instructions (Signed)
Follow-up with [facility] for your fractured nose. You will experience a lot of swelling in the next several days. You may apply ice packs as needed for swelling. Keep abrasions clean and dry. Watch for signs of infection such as redness or pus. Norco as needed for severe pain.

## 2016-05-05 NOTE — ED Provider Notes (Signed)
[facility] Emergency Department Provider Note   ____________________________________________   First MD Initiated Contact with Patient 05/05/16 1840     (approximate)  I have reviewed the triage vital signs and the nursing notes.   HISTORY  Chief Complaint Fall   HPI Micheal [name] is a 82 y.o. male was in the emergency room today with complaint of nosebleed after falling. Patient states that he tripped and fell outside. He tried to use his forearm to brace his fall. He denies any dizziness or loss of consciousness. He states that he did break his dentures. He also has a nosebleed and swelling to the bridge of his nose. Patient states that he does not take any blood thinners but tries to take an aspirin on a daily basis. Currently he complains of nasal pain and facial pain and neck pain. He denies any pain to his extremities. Patient has continued to be ambulatory without assistance since his fall. Patient rates his pain as a 5 out of 10.   Past Medical History:  Diagnosis Date  . Balance problem    INTERMITTENT WITH NEGATIVE WORKUP  . COPD (chronic obstructive pulmonary disease) ([place])   . Coronary artery disease   . Diabetes mellitus without complication ([place])   . GERD (gastroesophageal reflux disease)   . Hypercholesteremia   . Hypertension   . Myocardial infarction    2005  . Pneumonia    RECENT 5/17  . Shortness of breath dyspnea   . Wheezing     There are no active problems to display for this patient.   Past Surgical History:  Procedure Laterality Date  . APPENDECTOMY    . CARDIAC CATHETERIZATION    . CATARACT EXTRACTION W/PHACO Right 10/04/2015   Procedure: CATARACT EXTRACTION PHACO AND INTRAOCULAR LENS PLACEMENT (IOC);  Surgeon: [name], MD;  Location: [facility] ORS;  Service: Ophthalmology;  Laterality: Right;  [place] 02:06 AP% 17.9 CDE 22.65 fluid pack lot # [id number]  . CATARACT EXTRACTION W/PHACO Left 12/13/2015   Procedure:  CATARACT EXTRACTION PHACO AND INTRAOCULAR LENS PLACEMENT ([place]);  Surgeon: [name], MD;  Location: [facility] ORS;  Service: Ophthalmology;  Laterality: Left;  [place] 1.39 AP% 25.3 CDE 26.37 Fluid pack lot # [id number]  . COLONOSCOPY    . CORONARY ANGIOPLASTY     Stent placement    Prior to Admission medications   Medication Sig Start Date End Date Taking? Authorizing Provider  ATORVASTATIN CALCIUM PO Take by mouth.    Historical Provider, MD  carvedilol (COREG) 6.25 MG tablet Take 6.25 mg by mouth 2 (two) times daily with a meal.    Historical Provider, MD  Erythromycin Base ([place] PO) Take by mouth.    Historical Provider, MD  glipiZIDE (GLUCOTROL XL) 2.5 MG 24 hr tablet Take 2.5 mg by mouth daily with breakfast.    Historical Provider, MD  HYDROcodone-acetaminophen (NORCO/VICODIN) 5-325 MG tablet Take 1 tablet by mouth every 4 (four) hours as needed for moderate pain. 05/05/16   Johnn Hai, PA-C  lisinopril (PRINIVIL,ZESTRIL) 10 MG tablet Take 10 mg by mouth daily.    Historical Provider, MD  loperamide (IMODIUM) 2 MG capsule Take by mouth as needed for diarrhea or loose stools.    Historical Provider, MD  metFORMIN (GLUCOPHAGE) 500 MG tablet Take 500 mg by mouth.    Historical Provider, MD  Multiple Vitamin (MULTIVITAMIN) capsule Take 1 capsule by mouth daily.    Historical Provider, MD  ranitidine (ZANTAC) 150 MG tablet Take 150  mg by mouth.    Historical Provider, MD    Allergies Patient has no known allergies.  No family history on file.  Social History Social History  Substance Use Topics  . Smoking status: Former Research scientist (life sciences)  . Smokeless tobacco: Never Used  . Alcohol use No     Comment: OCCAS    Review of Systems Constitutional: No fever/chills Eyes: No visual changes. ENT: Positive nosebleed, positive nasal bone pain. Positive injury to dentures. Cardiovascular: Denies chest pain. Respiratory: Denies shortness of breath. Gastrointestinal:  No nausea, no vomiting.    Musculoskeletal: Positive for cervical pain. Skin: Positive for multiple abrasions. Neurological: Negative for headaches, focal weakness or numbness.  10-point ROS otherwise negative.  ____________________________________________   PHYSICAL EXAM:  VITAL SIGNS: ED Triage Vitals [05/05/16 1511]  Enc Vitals Group     BP (!) 163/56     Pulse Rate 91     Resp 20     Temp 97.8 F (36.6 C)     Temp Source Oral     SpO2 96 %     Weight 145 lb (65.8 kg)     Height 5\' 7"  (1.702 m)     Head Circumference      Peak Flow      Pain Score 5     Pain Loc      Pain Edu?      Excl. in [place]?     Constitutional: Alert and oriented. Well appearing and in no acute distress. Eyes: Conjunctivae are normal. PERRL. EOMI. Head: Atraumatic. Nose: Moderate tenderness on palpation of the nasal bone along with soft tissue swelling. There is evidence of bleeding especially the right naris but no active bleeding at this time. Mouth/Throat: Mucous membranes are moist.  Oropharynx non-erythematous. On examination of the mouth there is no lacerations noted to the inside. Patient has a very superficial less than 0.5 cm laceration to his upper lip without active bleeding. This laceration does not cross the vermilion border. Gums are without injury. Neck: No stridor.  There is minimal tenderness on palpation of the cervical spine and paravertebral muscles bilaterally. Patient continues to have range of motion in all 4 planes but is beginning to feel "soreness". No soft tissue swelling was appreciated. Cardiovascular: Normal rate, regular rhythm. Grossly normal heart sounds.  Good peripheral circulation. Respiratory: Normal respiratory effort.  No retractions. Lungs CTAB. Gastrointestinal: Soft and nontender. No distention. Bowel sounds normoactive 4 quadrants. Musculoskeletal: Patient moves upper and lower extremities without any difficulty. Normal gait was noted. There is no tenderness on palpation of the lumbar  spine. Thoracic spine was nontender. Neurologic:  Normal speech and language. No gross focal neurologic deficits are appreciated. No gait instability. Skin:  Skin is warm, dry. There are multiple very superficial abrasions noted to the bridge of the nose, right upper extremity, left hand,. There is no active bleeding. None of which require suturing. No foreign body was noted in these areas. Psychiatric: Mood and affect are normal. Speech and behavior are normal.  ____________________________________________   LABS (all labs ordered are listed, but only abnormal results are displayed)  Labs Reviewed - No data to display  RADIOLOGY CT maxillofacial and head without contrast per radiologist: IMPRESSION:  1. Soft tissue swelling around the nose due to fractures through the  superior and inferior aspects of the nasal septum as described  above. There is also a fracture through the anterior right nasal  bone.  2. No other facial bone fractures.  3. No  intracranial soft tissue abnormalities.   Cervical spine x-ray per radiologist negative for acute fracture.   IMPRESSION:  1. 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  2. No definite acute fracture.  3. Incomplete visualization of the cervicothoracic junction.       ____________________________________________   PROCEDURES  Procedure(s) performed: None  Procedures  Critical Care performed: No  ____________________________________________   INITIAL IMPRESSION / ASSESSMENT AND PLAN / ED COURSE  Pertinent labs & imaging results that were available during my care of the patient were reviewed by me and considered in my medical decision making (see chart for details).    Clinical Course    Patient was given Norco as needed for pain. He is encouraged to use ice to his nose to help with swelling and also to reduce pain. Patient also has multiple abrasions and he is aware that he needs to keep these clean  and dry. He was given instructions to clean with mild soap and  water daily and watch for signs of infection. Patient is aware that most likely the next 2 days he will have more pain than what he is experiencing now. He is to take his pain medication only as directed. He will follow up with his primary care doctor or [facility] if any continued problems.  ____________________________________________   FINAL CLINICAL IMPRESSION(S) / ED DIAGNOSES  Final diagnoses:  Closed fracture of nasal bone, initial encounter  Contusion of face, initial encounter  Cervical strain, acute, initial encounter  Fall, initial encounter  Abrasions of multiple sites      NEW MEDICATIONS STARTED DURING THIS VISIT:  Discharge Medication List as of 05/05/2016  8:36 PM    START taking these medications   Details  HYDROcodone-acetaminophen (NORCO/VICODIN) 5-325 MG tablet Take 1 tablet by mouth every 4 (four) hours as needed for moderate pain., Starting Sat 05/05/2016, Print         Note:  This document was prepared using Dragon voice recognition software and may include unintentional dictation errors.    Johnn Hai, PA-C 05/06/16 0014    Merlyn Lot, MD 05/06/16 [phone]

## 2016-05-05 NOTE — ED Triage Notes (Signed)
Patient presents to the ED with bleeding to nose and swelling to bridge of nose after he tripped and fell outside.  Patient denies passing out, patient denies taking blood thinners other than daily aspirin.  Patient denies recent falls other than this one.  Patient is in no obvious distress at this time.

## 2017-08-18 ENCOUNTER — Other Ambulatory Visit: Payer: Self-pay

## 2017-08-18 ENCOUNTER — Inpatient Hospital Stay
Admission: EM | Admit: 2017-08-18 | Discharge: 2017-08-21 | DRG: 193 | Disposition: A | Discharge status: Home Health | Payer: Medicare Other | Attending: Internal Medicine | Admitting: Internal Medicine

## 2017-08-18 DIAGNOSIS — E1122 Type 2 diabetes mellitus with diabetic chronic kidney disease: Secondary | ICD-10-CM | POA: Diagnosis present

## 2017-08-18 DIAGNOSIS — Z8249 Family history of ischemic heart disease and other diseases of the circulatory system: Secondary | ICD-10-CM

## 2017-08-18 DIAGNOSIS — I2510 Atherosclerotic heart disease of native coronary artery without angina pectoris: Secondary | ICD-10-CM | POA: Diagnosis present

## 2017-08-18 DIAGNOSIS — J181 Lobar pneumonia, unspecified organism: Principal | ICD-10-CM | POA: Diagnosis present

## 2017-08-18 DIAGNOSIS — E785 Hyperlipidemia, unspecified: Secondary | ICD-10-CM | POA: Diagnosis present

## 2017-08-18 DIAGNOSIS — F17200 Nicotine dependence, unspecified, uncomplicated: Secondary | ICD-10-CM | POA: Diagnosis present

## 2017-08-18 DIAGNOSIS — J9692 Respiratory failure, unspecified with hypercapnia: Secondary | ICD-10-CM

## 2017-08-18 DIAGNOSIS — E7800 Pure hypercholesterolemia, unspecified: Secondary | ICD-10-CM | POA: Diagnosis present

## 2017-08-18 DIAGNOSIS — J441 Chronic obstructive pulmonary disease with (acute) exacerbation: Secondary | ICD-10-CM

## 2017-08-18 DIAGNOSIS — I252 Old myocardial infarction: Secondary | ICD-10-CM

## 2017-08-18 DIAGNOSIS — J9691 Respiratory failure, unspecified with hypoxia: Secondary | ICD-10-CM | POA: Diagnosis present

## 2017-08-18 DIAGNOSIS — I129 Hypertensive chronic kidney disease with stage 1 through stage 4 chronic kidney disease, or unspecified chronic kidney disease: Secondary | ICD-10-CM | POA: Diagnosis present

## 2017-08-18 DIAGNOSIS — R0902 Hypoxemia: Secondary | ICD-10-CM | POA: Diagnosis not present

## 2017-08-18 DIAGNOSIS — N179 Acute kidney failure, unspecified: Secondary | ICD-10-CM | POA: Diagnosis present

## 2017-08-18 DIAGNOSIS — Z9114 Patient's other noncompliance with medication regimen: Secondary | ICD-10-CM

## 2017-08-18 DIAGNOSIS — Z955 Presence of coronary angioplasty implant and graft: Secondary | ICD-10-CM

## 2017-08-18 DIAGNOSIS — N183 Chronic kidney disease, stage 3 (moderate): Secondary | ICD-10-CM | POA: Diagnosis present

## 2017-08-18 DIAGNOSIS — J9621 Acute and chronic respiratory failure with hypoxia: Secondary | ICD-10-CM | POA: Diagnosis present

## 2017-08-18 DIAGNOSIS — T380X5A Adverse effect of glucocorticoids and synthetic analogues, initial encounter: Secondary | ICD-10-CM | POA: Diagnosis present

## 2017-08-18 DIAGNOSIS — E1165 Type 2 diabetes mellitus with hyperglycemia: Secondary | ICD-10-CM | POA: Diagnosis present

## 2017-08-18 DIAGNOSIS — J189 Pneumonia, unspecified organism: Secondary | ICD-10-CM

## 2017-08-18 DIAGNOSIS — Z7984 Long term (current) use of oral hypoglycemic drugs: Secondary | ICD-10-CM

## 2017-08-18 DIAGNOSIS — Z7982 Long term (current) use of aspirin: Secondary | ICD-10-CM

## 2017-08-18 DIAGNOSIS — K219 Gastro-esophageal reflux disease without esophagitis: Secondary | ICD-10-CM | POA: Diagnosis present

## 2017-08-18 DIAGNOSIS — J440 Chronic obstructive pulmonary disease with acute lower respiratory infection: Secondary | ICD-10-CM | POA: Diagnosis present

## 2017-08-18 DIAGNOSIS — J9622 Acute and chronic respiratory failure with hypercapnia: Secondary | ICD-10-CM | POA: Diagnosis present

## 2017-08-18 DIAGNOSIS — Z9049 Acquired absence of other specified parts of digestive tract: Secondary | ICD-10-CM

## 2017-08-18 NOTE — ED Triage Notes (Signed)
Pt has been sick all week, coughing and congested, weakness, pt o2 sat in triage 73% on RA, wife states that he is a diabetic, has a heart hx and hasn't been seen by a dr in a while and hasn't been taking meds as directed

## 2017-08-19 ENCOUNTER — Emergency Department: Payer: Medicare Other

## 2017-08-19 DIAGNOSIS — I2510 Atherosclerotic heart disease of native coronary artery without angina pectoris: Secondary | ICD-10-CM | POA: Diagnosis present

## 2017-08-19 DIAGNOSIS — J9621 Acute and chronic respiratory failure with hypoxia: Secondary | ICD-10-CM

## 2017-08-19 DIAGNOSIS — E7800 Pure hypercholesterolemia, unspecified: Secondary | ICD-10-CM | POA: Diagnosis present

## 2017-08-19 DIAGNOSIS — J181 Lobar pneumonia, unspecified organism: Principal | ICD-10-CM

## 2017-08-19 DIAGNOSIS — E1122 Type 2 diabetes mellitus with diabetic chronic kidney disease: Secondary | ICD-10-CM | POA: Diagnosis present

## 2017-08-19 DIAGNOSIS — K219 Gastro-esophageal reflux disease without esophagitis: Secondary | ICD-10-CM | POA: Diagnosis present

## 2017-08-19 DIAGNOSIS — J9622 Acute and chronic respiratory failure with hypercapnia: Secondary | ICD-10-CM | POA: Diagnosis not present

## 2017-08-19 DIAGNOSIS — J9692 Respiratory failure, unspecified with hypercapnia: Secondary | ICD-10-CM

## 2017-08-19 DIAGNOSIS — Z7984 Long term (current) use of oral hypoglycemic drugs: Secondary | ICD-10-CM | POA: Diagnosis not present

## 2017-08-19 DIAGNOSIS — J441 Chronic obstructive pulmonary disease with (acute) exacerbation: Secondary | ICD-10-CM | POA: Diagnosis present

## 2017-08-19 DIAGNOSIS — Z8249 Family history of ischemic heart disease and other diseases of the circulatory system: Secondary | ICD-10-CM | POA: Diagnosis not present

## 2017-08-19 DIAGNOSIS — E785 Hyperlipidemia, unspecified: Secondary | ICD-10-CM | POA: Diagnosis present

## 2017-08-19 DIAGNOSIS — Z955 Presence of coronary angioplasty implant and graft: Secondary | ICD-10-CM | POA: Diagnosis not present

## 2017-08-19 DIAGNOSIS — N179 Acute kidney failure, unspecified: Secondary | ICD-10-CM | POA: Diagnosis present

## 2017-08-19 DIAGNOSIS — J9691 Respiratory failure, unspecified with hypoxia: Secondary | ICD-10-CM | POA: Diagnosis present

## 2017-08-19 DIAGNOSIS — Z9049 Acquired absence of other specified parts of digestive tract: Secondary | ICD-10-CM | POA: Diagnosis not present

## 2017-08-19 DIAGNOSIS — I252 Old myocardial infarction: Secondary | ICD-10-CM | POA: Diagnosis not present

## 2017-08-19 DIAGNOSIS — T380X5A Adverse effect of glucocorticoids and synthetic analogues, initial encounter: Secondary | ICD-10-CM | POA: Diagnosis present

## 2017-08-19 DIAGNOSIS — N183 Chronic kidney disease, stage 3 (moderate): Secondary | ICD-10-CM | POA: Diagnosis present

## 2017-08-19 DIAGNOSIS — J440 Chronic obstructive pulmonary disease with acute lower respiratory infection: Secondary | ICD-10-CM | POA: Diagnosis present

## 2017-08-19 DIAGNOSIS — F17200 Nicotine dependence, unspecified, uncomplicated: Secondary | ICD-10-CM | POA: Diagnosis present

## 2017-08-19 DIAGNOSIS — J189 Pneumonia, unspecified organism: Secondary | ICD-10-CM

## 2017-08-19 DIAGNOSIS — E1165 Type 2 diabetes mellitus with hyperglycemia: Secondary | ICD-10-CM | POA: Diagnosis present

## 2017-08-19 DIAGNOSIS — I129 Hypertensive chronic kidney disease with stage 1 through stage 4 chronic kidney disease, or unspecified chronic kidney disease: Secondary | ICD-10-CM | POA: Diagnosis present

## 2017-08-19 DIAGNOSIS — R0902 Hypoxemia: Secondary | ICD-10-CM | POA: Diagnosis present

## 2017-08-19 DIAGNOSIS — Z9114 Patient's other noncompliance with medication regimen: Secondary | ICD-10-CM | POA: Diagnosis not present

## 2017-08-19 DIAGNOSIS — Z7982 Long term (current) use of aspirin: Secondary | ICD-10-CM | POA: Diagnosis not present

## 2017-08-19 LAB — BLOOD GAS, ARTERIAL
ACID-BASE EXCESS: 6.2 mmol/L — AB (ref 0.0–2.0)
BICARBONATE: 35.6 mmol/L — AB (ref 20.0–28.0)
Delivery systems: POSITIVE
Expiratory PAP: 5
FIO2: 55
Inspiratory PAP: 12
O2 SAT: 99.2 %
PATIENT TEMPERATURE: 37
PH ART: 7.24 — AB (ref 7.350–7.450)
PO2 ART: 161 mmHg — AB (ref 83.0–108.0)
pCO2 arterial: 83 mmHg (ref 32.0–48.0)

## 2017-08-19 LAB — GLUCOSE, CAPILLARY
GLUCOSE-CAPILLARY: 204 mg/dL — AB (ref 65–99)
GLUCOSE-CAPILLARY: 160 mg/dL — AB (ref 65–99)
Glucose-Capillary: 221 mg/dL — ABNORMAL HIGH (ref 65–99)
Glucose-Capillary: 151 mg/dL — ABNORMAL HIGH (ref 65–99)
Glucose-Capillary: 130 mg/dL — ABNORMAL HIGH (ref 65–99)
Glucose-Capillary: 248 mg/dL — ABNORMAL HIGH (ref 65–99)

## 2017-08-19 LAB — CBC
HCT: 30.8 % — ABNORMAL LOW (ref 40.0–52.0)
HCT: 34.7 % — ABNORMAL LOW (ref 40.0–52.0)
HEMOGLOBIN: 10.1 g/dL — AB (ref 13.0–18.0)
HEMOGLOBIN: 11.3 g/dL — AB (ref 13.0–18.0)
MCH: 31.2 pg (ref 26.0–34.0)
MCH: 31.2 pg (ref 26.0–34.0)
MCHC: 32.7 g/dL (ref 32.0–36.0)
MCHC: 32.7 g/dL (ref 32.0–36.0)
MCV: 95.3 fL (ref 80.0–100.0)
MCV: 95.4 fL (ref 80.0–100.0)
PLATELETS: 162 10*3/uL (ref 150–440)
Platelets: 172 10*3/uL (ref 150–440)
RBC: 3.23 MIL/uL — AB (ref 4.40–5.90)
RBC: 3.63 MIL/uL — ABNORMAL LOW (ref 4.40–5.90)
RDW: 14.5 % (ref 11.5–14.5)
RDW: 14.5 % (ref 11.5–14.5)
WBC: 14.2 10*3/uL — AB (ref 3.8–10.6)
WBC: 14.7 10*3/uL — AB (ref 3.8–10.6)

## 2017-08-19 LAB — COMPREHENSIVE METABOLIC PANEL
ALK PHOS: 56 U/L (ref 38–126)
ALT: 13 U/L — ABNORMAL LOW (ref 17–63)
AST: 25 U/L (ref 15–41)
Albumin: 3.3 g/dL — ABNORMAL LOW (ref 3.5–5.0)
Anion gap: 11 (ref 5–15)
BUN: 31 mg/dL — AB (ref 6–20)
CALCIUM: 8 mg/dL — AB (ref 8.9–10.3)
CO2: 31 mmol/L (ref 22–32)
Chloride: 93 mmol/L — ABNORMAL LOW (ref 101–111)
Creatinine, Ser: 1.64 mg/dL — ABNORMAL HIGH (ref 0.61–1.24)
GFR calc Af Amer: 43 mL/min — ABNORMAL LOW (ref >60)
GFR, EST NON AFRICAN AMERICAN: 37 mL/min — AB (ref >60)
Glucose, Bld: 285 mg/dL — ABNORMAL HIGH (ref 65–99)
POTASSIUM: 4.6 mmol/L (ref 3.5–5.1)
Sodium: 135 mmol/L (ref 135–145)
TOTAL PROTEIN: 6.7 g/dL (ref 6.5–8.1)
Total Bilirubin: 0.5 mg/dL (ref 0.3–1.2)

## 2017-08-19 LAB — BLOOD GAS, VENOUS
Acid-Base Excess: 6.9 mmol/L — ABNORMAL HIGH (ref 0.0–2.0)
BICARBONATE: 35.2 mmol/L — AB (ref 20.0–28.0)
O2 Saturation: 83.6 %
PATIENT TEMPERATURE: 37
PH VEN: 7.31 (ref 7.250–7.430)
pCO2, Ven: 70 mmHg — ABNORMAL HIGH (ref 44.0–60.0)
pO2, Ven: 53 mmHg — ABNORMAL HIGH (ref 32.0–45.0)

## 2017-08-19 LAB — LIPID PANEL
CHOLESTEROL: 83 mg/dL (ref 0–200)
HDL: 35 mg/dL — ABNORMAL LOW (ref >40)
LDL Cholesterol: 38 mg/dL (ref 0–99)
TRIGLYCERIDES: 49 mg/dL (ref <150)
Total CHOL/HDL Ratio: 2.4 RATIO
VLDL: 10 mg/dL (ref 0–40)

## 2017-08-19 LAB — LACTIC ACID, PLASMA
Lactic Acid, Venous: 1.6 mmol/L (ref 0.5–1.9)
Lactic Acid, Venous: 1.2 mmol/L (ref 0.5–1.9)

## 2017-08-19 LAB — PROCALCITONIN: Procalcitonin: 1.94 ng/mL

## 2017-08-19 LAB — INFLUENZA PANEL BY PCR (TYPE A & B)
Influenza A By PCR: NEGATIVE
Influenza B By PCR: NEGATIVE

## 2017-08-19 LAB — HEMOGLOBIN A1C
HEMOGLOBIN A1C: 7.4 % — AB (ref 4.8–5.6)
MEAN PLASMA GLUCOSE: 165.68 mg/dL

## 2017-08-19 LAB — CREATININE, SERUM
CREATININE: 1.58 mg/dL — AB (ref 0.61–1.24)
GFR calc non Af Amer: 39 mL/min — ABNORMAL LOW (ref >60)
GFR, EST AFRICAN AMERICAN: 45 mL/min — AB (ref >60)

## 2017-08-19 LAB — PHOSPHORUS: Phosphorus: 2.9 mg/dL (ref 2.5–4.6)

## 2017-08-19 LAB — MAGNESIUM: Magnesium: 2.1 mg/dL (ref 1.7–2.4)

## 2017-08-19 LAB — TROPONIN I: Troponin I: 0.07 ng/mL (ref <0.03)

## 2017-08-19 LAB — MRSA PCR SCREENING: MRSA by PCR: NEGATIVE

## 2017-08-19 MED ORDER — LACTATED RINGERS IV SOLN
INTRAVENOUS | Status: DC
  Administered 2017-08-19 (×2): via INTRAVENOUS
  Administered 2017-08-19: 75 mL/h via INTRAVENOUS

## 2017-08-19 MED ORDER — AZITHROMYCIN 500 MG IV SOLR
500.0000 mg | Freq: Once | INTRAVENOUS | Status: AC
  Administered 2017-08-19: 500 mg via INTRAVENOUS
  Filled 2017-08-19: qty 500

## 2017-08-19 MED ORDER — SODIUM CHLORIDE 0.9 % IV SOLN
500.0000 mg | INTRAVENOUS | Status: DC
  Filled 2017-08-19: qty 500

## 2017-08-19 MED ORDER — METHYLPREDNISOLONE SODIUM SUCC 125 MG IJ SOLR
125.0000 mg | Freq: Once | INTRAMUSCULAR | Status: AC
  Administered 2017-08-19: 125 mg via INTRAVENOUS
  Filled 2017-08-19: qty 2

## 2017-08-19 MED ORDER — IPRATROPIUM-ALBUTEROL 0.5-2.5 (3) MG/3ML IN SOLN
3.0000 mL | Freq: Once | RESPIRATORY_TRACT | Status: AC
  Administered 2017-08-19: 3 mL via RESPIRATORY_TRACT

## 2017-08-19 MED ORDER — CARVEDILOL 6.25 MG PO TABS
6.2500 mg | ORAL_TABLET | Freq: Two times a day (BID) | ORAL | Status: DC
  Administered 2017-08-20 – 2017-08-21 (×3): 6.25 mg via ORAL
  Filled 2017-08-19 (×3): qty 1

## 2017-08-19 MED ORDER — LACTATED RINGERS IV BOLUS
1000.0000 mL | Freq: Once | INTRAVENOUS | Status: AC
  Administered 2017-08-19: 1000 mL via INTRAVENOUS

## 2017-08-19 MED ORDER — PREDNISONE 20 MG PO TABS
40.0000 mg | ORAL_TABLET | Freq: Every day | ORAL | Status: DC
  Administered 2017-08-20 – 2017-08-21 (×2): 40 mg via ORAL
  Filled 2017-08-19 (×2): qty 2

## 2017-08-19 MED ORDER — GUAIFENESIN ER 600 MG PO TB12
600.0000 mg | ORAL_TABLET | Freq: Two times a day (BID) | ORAL | Status: DC
  Administered 2017-08-19 – 2017-08-21 (×4): 600 mg via ORAL
  Filled 2017-08-19 (×5): qty 1

## 2017-08-19 MED ORDER — SODIUM CHLORIDE 0.9 % IV SOLN
500.0000 mg | INTRAVENOUS | Status: DC
  Administered 2017-08-19 – 2017-08-20 (×2): 500 mg via INTRAVENOUS
  Filled 2017-08-19 (×4): qty 500

## 2017-08-19 MED ORDER — ALBUTEROL SULFATE (2.5 MG/3ML) 0.083% IN NEBU: 5.0000 mg | INHALATION_SOLUTION | Freq: Once | RESPIRATORY_TRACT | Status: DC

## 2017-08-19 MED ORDER — BUDESONIDE 0.5 MG/2ML IN SUSP
0.5000 mg | Freq: Two times a day (BID) | RESPIRATORY_TRACT | Status: DC
  Administered 2017-08-19 – 2017-08-21 (×5): 0.5 mg via RESPIRATORY_TRACT
  Filled 2017-08-19 (×6): qty 2

## 2017-08-19 MED ORDER — MAGNESIUM SULFATE 2 GM/50ML IV SOLN
INTRAVENOUS | Status: AC
  Filled 2017-08-19: qty 50

## 2017-08-19 MED ORDER — IPRATROPIUM-ALBUTEROL 0.5-2.5 (3) MG/3ML IN SOLN
3.0000 mL | Freq: Four times a day (QID) | RESPIRATORY_TRACT | Status: DC
  Administered 2017-08-19 – 2017-08-21 (×9): 3 mL via RESPIRATORY_TRACT
  Filled 2017-08-19 (×11): qty 3

## 2017-08-19 MED ORDER — SODIUM CHLORIDE 0.9 % IV BOLUS
1000.0000 mL | Freq: Once | INTRAVENOUS | Status: AC
  Administered 2017-08-19: 1000 mL via INTRAVENOUS

## 2017-08-19 MED ORDER — SODIUM CHLORIDE 0.9 % IV SOLN
1.0000 g | INTRAVENOUS | Status: DC
  Filled 2017-08-19: qty 10

## 2017-08-19 MED ORDER — HYDRALAZINE HCL 20 MG/ML IJ SOLN: 10.0000 mg | INTRAMUSCULAR | Status: DC | PRN

## 2017-08-19 MED ORDER — SODIUM CHLORIDE 0.9 % IV SOLN
1.0000 g | INTRAVENOUS | Status: DC
  Administered 2017-08-19 – 2017-08-20 (×2): 1 g via INTRAVENOUS
  Filled 2017-08-19 (×4): qty 10

## 2017-08-19 MED ORDER — INSULIN ASPART 100 UNIT/ML ~~LOC~~ SOLN
0.0000 [IU] | Freq: Every day | SUBCUTANEOUS | Status: DC
  Administered 2017-08-19: 2 [IU] via SUBCUTANEOUS
  Administered 2017-08-20: 3 [IU] via SUBCUTANEOUS
  Filled 2017-08-19 (×2): qty 1

## 2017-08-19 MED ORDER — LISINOPRIL 10 MG PO TABS
10.0000 mg | ORAL_TABLET | Freq: Every day | ORAL | Status: DC
  Administered 2017-08-19 – 2017-08-21 (×3): 10 mg via ORAL
  Filled 2017-08-19: qty 2
  Filled 2017-08-19: qty 1

## 2017-08-19 MED ORDER — INSULIN ASPART 100 UNIT/ML ~~LOC~~ SOLN
0.0000 [IU] | Freq: Three times a day (TID) | SUBCUTANEOUS | Status: DC
  Administered 2017-08-19: 2 [IU] via SUBCUTANEOUS
  Administered 2017-08-19: 5 [IU] via SUBCUTANEOUS
  Administered 2017-08-19: 3 [IU] via SUBCUTANEOUS
  Administered 2017-08-20 (×3): 5 [IU] via SUBCUTANEOUS
  Administered 2017-08-21: 11 [IU] via SUBCUTANEOUS
  Administered 2017-08-21: 3 [IU] via SUBCUTANEOUS
  Filled 2017-08-19 (×8): qty 1

## 2017-08-19 MED ORDER — METHYLPREDNISOLONE SODIUM SUCC 125 MG IJ SOLR
60.0000 mg | Freq: Four times a day (QID) | INTRAMUSCULAR | Status: DC
  Administered 2017-08-19: 60 mg via INTRAVENOUS
  Filled 2017-08-19: qty 2

## 2017-08-19 MED ORDER — GLIPIZIDE ER 2.5 MG PO TB24
2.5000 mg | ORAL_TABLET | Freq: Every day | ORAL | Status: DC
  Administered 2017-08-20 – 2017-08-21 (×2): 2.5 mg via ORAL
  Filled 2017-08-19 (×2): qty 1

## 2017-08-19 MED ORDER — PANTOPRAZOLE SODIUM 40 MG PO TBEC
40.0000 mg | DELAYED_RELEASE_TABLET | Freq: Every day | ORAL | Status: DC
  Administered 2017-08-20 – 2017-08-21 (×2): 40 mg via ORAL
  Filled 2017-08-19 (×2): qty 1

## 2017-08-19 MED ORDER — IPRATROPIUM-ALBUTEROL 0.5-2.5 (3) MG/3ML IN SOLN
RESPIRATORY_TRACT | Status: AC
  Administered 2017-08-19: 3 mL via RESPIRATORY_TRACT
  Filled 2017-08-19: qty 9

## 2017-08-19 MED ORDER — MAGNESIUM SULFATE 2 GM/50ML IV SOLN
2.0000 g | Freq: Once | INTRAVENOUS | Status: AC
  Administered 2017-08-19: 2 g via INTRAVENOUS

## 2017-08-19 MED ORDER — HEPARIN SODIUM (PORCINE) 5000 UNIT/ML IJ SOLN
5000.0000 [IU] | Freq: Three times a day (TID) | INTRAMUSCULAR | Status: DC
  Administered 2017-08-19 – 2017-08-21 (×8): 5000 [IU] via SUBCUTANEOUS
  Filled 2017-08-19 (×8): qty 1

## 2017-08-19 MED ORDER — FAMOTIDINE 20 MG PO TABS
10.0000 mg | ORAL_TABLET | Freq: Every day | ORAL | Status: DC
  Administered 2017-08-19 – 2017-08-21 (×3): 10 mg via ORAL
  Filled 2017-08-19 (×2): qty 1

## 2017-08-19 MED ORDER — SODIUM CHLORIDE 0.9 % IV SOLN
1.0000 g | Freq: Once | INTRAVENOUS | Status: AC
  Administered 2017-08-19: 1 g via INTRAVENOUS
  Filled 2017-08-19: qty 10

## 2017-08-19 MED ORDER — PANTOPRAZOLE SODIUM 40 MG IV SOLR
40.0000 mg | INTRAVENOUS | Status: DC
  Administered 2017-08-19: 40 mg via INTRAVENOUS
  Filled 2017-08-19: qty 40

## 2017-08-19 MED ORDER — ALBUTEROL SULFATE (2.5 MG/3ML) 0.083% IN NEBU
2.5000 mg | INHALATION_SOLUTION | RESPIRATORY_TRACT | Status: DC | PRN
Start: 2017-08-19 — End: 2017-08-21

## 2017-08-19 NOTE — Progress Notes (Signed)
Son came to visit patient and did not have the password. This RN went to speak with him and explained that the wife and patient did not want anyone to visit without the password, and per policy and the patient's wishes I could not give him any information other than the fact that the patient is currently stable. Pt's son stated, "You mean I'll have to get an attorney to see my dad?" This RN explained again, that he could not visit without the password, as it was the patient and his wife's wishes. Son asked for me to go and reverify that with the patient and to tell him that his son was here. This RN went into the room and spoke with patient, and asked him again if people wanted to have the password to be able to visit, and pt stated yes. I asked him if he wanted to be able to visit with his son and pt stated, "I just want to rest." This RN went back out to speak with the patient's son with the charge RN Roselyn Reef, and explained that I had spoken with the patient and that he stated that he wanted to rest. Son visibly upset that he can't visit with his father.

## 2017-08-19 NOTE — ED Provider Notes (Addendum)
[facility] Emergency Department Provider Note   ____________________________________________   First MD Initiated Contact with Patient 08/19/17 0002     (approximate)  I have reviewed the triage vital signs and the nursing notes.   HISTORY  Chief Complaint Respiratory Distress    HPI Micheal [name] is a 84 y.o. male who comes into the hospital today with some difficulty breathing.  The patient has a history of COPD and is been having some cough with shortness of breath for the past week.  The family states that he has been getting worse and worse.  He was refusing to come into the hospital until tonight.  The patient does not wear O2 at home.  He has been trying to use his albuterol but has not been helping.  They state that he has been gasping for breath and coughing a bunch.  The patient has not checked his temperature at home.  He denies any chest pain.  His cough is been coughing up some sputum but he is unable to tell me exactly what it looks like.  The patient is here today for evaluation of his symptoms.  He denies any sick contacts.  Past Medical History:  Diagnosis Date  . Balance problem    INTERMITTENT WITH NEGATIVE WORKUP  . COPD (chronic obstructive pulmonary disease) ([place])   . Coronary artery disease   . Diabetes mellitus without complication ([place])   . GERD (gastroesophageal reflux disease)   . Hypercholesteremia   . Hypertension   . Myocardial infarction    2005  . Pneumonia    RECENT 5/17  . Shortness of breath dyspnea   . Wheezing     Patient Active Problem List   Diagnosis Date Noted  . Respiratory failure with hypoxia and hypercapnia ([place]) 08/19/2017    Past Surgical History:  Procedure Laterality Date  . APPENDECTOMY    . CARDIAC CATHETERIZATION    . CATARACT EXTRACTION W/PHACO Right 10/04/2015   Procedure: CATARACT EXTRACTION PHACO AND INTRAOCULAR LENS PLACEMENT (IOC);  Surgeon: [name], MD;  Location: [facility] ORS;   Service: Ophthalmology;  Laterality: Right;  [place] 02:06 AP% 17.9 CDE 22.65 fluid pack lot # [id number]  . CATARACT EXTRACTION W/PHACO Left 12/13/2015   Procedure: CATARACT EXTRACTION PHACO AND INTRAOCULAR LENS PLACEMENT ([place]);  Surgeon: [name], MD;  Location: [facility] ORS;  Service: Ophthalmology;  Laterality: Left;  [place] 1.39 AP% 25.3 CDE 26.37 Fluid pack lot # [id number]  . COLONOSCOPY    . CORONARY ANGIOPLASTY     Stent placement    Prior to Admission medications   Medication Sig Start Date End Date Taking? Authorizing Provider  ATORVASTATIN CALCIUM PO Take by mouth.    [provider]  carvedilol (COREG) 6.25 MG tablet Take 6.25 mg by mouth 2 (two) times daily with a meal.    [provider]  Erythromycin Base ([place] PO) Take by mouth.    [provider]  glipiZIDE (GLUCOTROL XL) 2.5 MG 24 hr tablet Take 2.5 mg by mouth daily with breakfast.    [provider]  HYDROcodone-acetaminophen (NORCO/VICODIN) 5-325 MG tablet Take 1 tablet by mouth every 4 (four) hours as needed for moderate pain. 05/05/16   Johnn Hai, PA-C  lisinopril (PRINIVIL,ZESTRIL) 10 MG tablet Take 10 mg by mouth daily.    [provider]  loperamide (IMODIUM) 2 MG capsule Take by mouth as needed for diarrhea or loose stools.    [provider]  metFORMIN (GLUCOPHAGE) 500  MG tablet Take 500 mg by mouth.    [provider]  Multiple Vitamin (MULTIVITAMIN) capsule Take 1 capsule by mouth daily.    [provider]  ranitidine (ZANTAC) 150 MG tablet Take 150 mg by mouth.    [provider]    Allergies Patient has no known allergies.  No family history on file.  Social History Social History   Tobacco Use  . Smoking status: Former Research scientist (life sciences)  . Smokeless tobacco: Never Used  Substance Use Topics  . Alcohol use: No    Comment: OCCAS  . Drug use: Not on file    Review of Systems  Constitutional: No fever/chills Eyes: No  visual changes. ENT: No sore throat. Cardiovascular: Denies chest pain. Respiratory: Cough and shortness of breath. Gastrointestinal: No abdominal pain.  No nausea, no vomiting.  No diarrhea.  No constipation. Genitourinary: Negative for dysuria. Musculoskeletal: Negative for back pain. Skin: Negative for rash. Neurological: Negative for headaches, focal weakness or numbness.   ____________________________________________   PHYSICAL EXAM:  VITAL SIGNS: ED Triage Vitals  Enc Vitals Group     BP 08/18/17 2357 (!) 99/48     Pulse Rate 08/18/17 2357 96     Resp 08/18/17 2357 (!) 27     Temp 08/18/17 2357 98.3 F (36.8 C)     Temp Source 08/18/17 2357 Oral     SpO2 08/18/17 2357 (!) 73 %     Weight 08/18/17 2359 145 lb (65.8 kg)     Height 08/18/17 2359 5\' 7"  (1.702 m)     Head Circumference --      Peak Flow --      Pain Score 08/18/17 2358 0     Pain Loc --      Pain Edu? --      Excl. in [place]? --     Constitutional: Alert and oriented. Well appearing and in moderate distress. Eyes: Conjunctivae are normal. PERRL. EOMI. Head: Atraumatic. Nose: No congestion/rhinnorhea. Mouth/Throat: Mucous membranes are moist.  Oropharynx non-erythematous. Cardiovascular: Normal rate, regular rhythm. Grossly normal heart sounds.  Good peripheral circulation. Respiratory: Increased respiratory effort.  Subcostal retractions.  Diminished breath sounds with some mild expiratory wheezes throughout all lung fields. Gastrointestinal: Soft and nontender. No distention.  Positive bowel sounds Musculoskeletal: No lower extremity tenderness nor edema.   Neurologic:  Normal speech and language.  Skin:  Skin is warm, dry and intact.  Psychiatric: Mood and affect are normal.   ____________________________________________   LABS (all labs ordered are listed, but only abnormal results are displayed)  Labs Reviewed  TROPONIN I - Abnormal; Notable for the following components:      Result Value    Troponin I 0.07 (*)    All other components within normal limits  CBC - Abnormal; Notable for the following components:   WBC 14.7 (*)    RBC 3.63 (*)    Hemoglobin 11.3 (*)    HCT 34.7 (*)    All other components within normal limits  COMPREHENSIVE METABOLIC PANEL - Abnormal; Notable for the following components:   Ch[place]ide 93 (*)    Glucose, Bld 285 (*)    BUN 31 (*)    Creatinine, Ser 1.64 (*)    Calcium 8.0 (*)    Albumin 3.3 (*)    ALT 13 (*)    GFR calc non Af Amer 37 (*)    GFR calc Af Amer 43 (*)    All other components within normal limits  BLOOD GAS,  VENOUS - Abnormal; Notable for the following components:   pCO2, Ven 70 (*)    pO2, Ven 53.0 (*)    Bicarbonate 35.2 (*)    Acid-Base Excess 6.9 (*)    All other components within normal limits  CULTURE, BLOOD (ROUTINE X 2)  CULTURE, BLOOD (ROUTINE X 2)  LACTIC ACID, PLASMA  LACTIC ACID, PLASMA  LIPID PANEL   ____________________________________________  EKG  ED ECG REPORT I, Loney Hering, the attending physician, personally viewed and interpreted this ECG.   Date: 08/19/2017  EKG Time: 0005  Rate: 95  Rhythm: normal sinus rhythm  Axis: normal  Intervals:Incomplete right bundle branch block.  ST&T Change: none  ED ECG REPORT #2 I, Loney Hering, the attending physician, personally viewed and interpreted this ECG.   Date: 08/19/2017  EKG Time: 142  Rate: 89  Rhythm: normal sinus rhythm  Axis: normal  Intervals:incomplete right bundle branch block  ST&T Change: none   ____________________________________________  RADIOLOGY  ED MD interpretation:  CXR: Nodular interstitial coarsening of the left lower lung field concerning for pneumonia.  Official radiology report(s): Dg Chest Portable 1 View  Result Date: 08/19/2017 CLINICAL DATA:  84 year old male with shortness of breath. EXAM: PORTABLE CHEST 1 VIEW COMPARISON:  None. FINDINGS: Patchy area of nodular interstitial coarsening in the  left mid to lower lung field concerning for pneumonia. Clinical correlation is recommended. There is no focal consolidation. No large pleural effusion noted. The right costophrenic angle however is excluded from the image. There is no pneumothorax. The cardiac silhouette is within normal limits. Atherosclerotic calcification of the aortic arch. No acute osseous pathology. IMPRESSION: Nodular interstitial coarsening of the left lower lung field concerning for pneumonia. Clinical correlation is recommended. Electronically Signed   By: Anner Crete M.D.   On: 08/19/2017 01:20    ____________________________________________   PROCEDURES  Procedure(s) performed: please, see procedure note(s).  .Critical Care Performed by: Loney Hering, MD Authorized by: Loney Hering, MD   Critical care provider statement:    Critical care time (minutes):  30   Critical care start time:  08/19/2017 12:02 AM   Critical care end time:  08/19/2017 12:32 AM   Critical care time was exclusive of:  Separately billable procedures and treating other patients   Critical care was necessary to treat or prevent imminent or life-threatening deterioration of the following conditions:  Respiratory failure   Critical care was time spent personally by me on the following activities:  Development of treatment plan with patient or surrogate, evaluation of patient's response to treatment, examination of patient, obtaining history from patient or surrogate, ordering and performing treatments and interventions, ordering and review of laboratory studies, ordering and review of radiographic studies, pulse oximetry, re-evaluation of patient's condition and review of old charts   I assumed direction of critical care for this patient from another provider in my specialty: no      Critical Care performed: Yes, see critical care note(s)  ____________________________________________   INITIAL IMPRESSION / ASSESSMENT AND PLAN /  ED COURSE  As part of my medical decision making, I reviewed the following data within the electronic MEDICAL RECORD NUMBER Notes from prior ED visits and [place] Controlled Substance Database   This is an 84 year old male with a history of COPD who comes into the hospital today with some shortness of breath.  The patient did have oxygen saturations of 73% when he arrived at the hospital.  My differential diagnosis includes COPD  exacerbation, pneumonia, bronchitis  I did go into the room immediately when the patient presented to his room.  I did order 2 DuoNeb treatments as his sats even on oxygen was still in the high 70s.  I also did order some Solu-Medrol.  I will await the results of the chest x-ray and the blood work and I will reassess the patient.  I will determine if he needs BiPAP or if he needs any other treatments.  Clinical Course as of Aug 19 248  Mon Aug 19, 2017  0039 The patient did receive a third DuoNeb treatment as the nurse states that he dropped his sats into the 80s once he completed his first 2.  I did reassess the patient and he now has some extensive wheezing throughout all of his lung fields.  I will still await the results of the patient's blood work and once I receive those results he will be admitted to the hospitalist service.   [AW]    Clinical Course User Index [AW] Loney Hering, MD   The patient's oxygen saturation did begin to drop again after the nebs as well as the Solu-Medrol.  I did order some magnesium sulfate and did order for the patient to be placed on BiPAP.  It appears that the patient does have pneumonia on his chest x-ray.  His PCO2 is also 70.  The patient's troponin is 0.07 and his white blood cell count is 14.7.  He will receive a dose of azithromycin and ceftriaxone and he will be admitted to the hospitalist service.  ____________________________________________   FINAL CLINICAL IMPRESSION(S) / ED DIAGNOSES  Final diagnoses:  COPD  exacerbation ([place])  Community acquired pneumonia of left lower lobe of lung ([place])  Hypoxia     ED Discharge Orders    None       Note:  This document was prepared using Dragon voice recognition software and may include unintentional dictation errors.    Loney Hering, MD 08/19/17 0250    Loney Hering, MD 08/19/17 [phone]

## 2017-08-19 NOTE — Progress Notes (Signed)
PHARMACIST - PHYSICIAN COMMUNICATION  CONCERNING: IV to Oral Route Change Policy  RECOMMENDATION: This patient is receiving pantoprazole by the intravenous route.  Based on criteria approved by the Pharmacy and Therapeutics Committee, the intravenous medication(s) is/are being converted to the equivalent oral dose form(s).   DESCRIPTION: These criteria include:  The patient is eating (either orally or via tube) and/or has been taking other orally administered medications for a least 24 hours  The patient has no evidence of active gastrointestinal bleeding or impaired GI absorption (gastrectomy, short bowel, patient on TNA or NPO).  If you have questions about this conversion, please contact the Pharmacy Department  []   [phone] )  [facility] [x]   [phone] )  [facility] []   [phone] )  [facility] []   [phone] )  [facility] []   [phone] )  [place], [facility] 08/19/2017 4:52 PM

## 2017-08-19 NOTE — H&P (Signed)
[name] NAME: Micheal [name]    MR#:  [id number]  DATE OF BIRTH:  [birth year]  DATE OF ADMISSION:  08/18/2017  PRIMARY CARE PHYSICIAN: Gweneth Fritter, MD   REQUESTING/REFERRING PHYSICIAN:   CHIEF COMPLAINT:   Chief Complaint  Patient presents with  . Respiratory Distress    HISTORY OF PRESENT ILLNESS: Micheal [name]  is a 84 y.o. male with a known history per below presenting with 1-2-week history of progressive worsening chest congestion, reductive cough, shortness of breath, dyspnea on exertion, had been refusing to come to the emergency room over this period of time per wife, noncompliant with medicines per wife, in the emergency room patient was noted to have O2 saturation of 73% on room air, noted tachypnea, patient placed on BiPAP for stabilization, creatinine 1.6 with baseline unknown, white count 14,000, chest x-ray noted for left-sided pneumonia, patient is now been admitted for acute on chronic hypoxic hypercapnic respiratory failure secondary to pneumonia and COPD exacerbation.  PAST MEDICAL HISTORY:   Past Medical History:  Diagnosis Date  . Balance problem    INTERMITTENT WITH NEGATIVE WORKUP  . COPD (chronic obstructive pulmonary disease) ([place])   . Coronary artery disease   . Diabetes mellitus without complication ([place])   . GERD (gastroesophageal reflux disease)   . Hypercholesteremia   . Hypertension   . Myocardial infarction    2005  . Pneumonia    RECENT 5/17  . Shortness of breath dyspnea   . Wheezing     PAST SURGICAL HISTORY:  Past Surgical History:  Procedure Laterality Date  . APPENDECTOMY    . CARDIAC CATHETERIZATION    . CATARACT EXTRACTION W/PHACO Right 10/04/2015   Procedure: CATARACT EXTRACTION PHACO AND INTRAOCULAR LENS PLACEMENT (IOC);  Surgeon: [name], MD;  Location: [facility] ORS;  Service: Ophthalmology;  Laterality: Right;  [place] 02:06 AP% 17.9 CDE 22.65 fluid pack lot # [id number]   . CATARACT EXTRACTION W/PHACO Left 12/13/2015   Procedure: CATARACT EXTRACTION PHACO AND INTRAOCULAR LENS PLACEMENT ([place]);  Surgeon: [name], MD;  Location: [facility] ORS;  Service: Ophthalmology;  Laterality: Left;  [place] 1.39 AP% 25.3 CDE 26.37 Fluid pack lot # [id number]  . COLONOSCOPY    . CORONARY ANGIOPLASTY     Stent placement    SOCIAL HISTORY:  Social History   Tobacco Use  . Smoking status: Former Research scientist (life sciences)  . Smokeless tobacco: Never Used  Substance Use Topics  . Alcohol use: No    Comment: OCCAS    FAMILY HISTORY:  HTN CAD  DRUG ALLERGIES: NKDA  REVIEW OF SYSTEMS:   CONSTITUTIONAL: No fever, +fatigue, weakness.  EYES: No blurred or double vision.  EARS, NOSE, AND THROAT: No tinnitus or ear pain.  RESPIRATORY: + cough, shortness of breath, wheezing, no hemoptysis.  CARDIOVASCULAR: No chest pain, orthopnea, edema.  GASTROINTESTINAL: No nausea, vomiting, diarrhea or abdominal pain.  GENITOURINARY: No dysuria, hematuria.  ENDOCRINE: No polyuria, nocturia,  HEMATOLOGY: No anemia, easy bruising or bleeding SKIN: No rash or lesion. MUSCULOSKELETAL: No joint pain or arthritis.   NEUROLOGIC: No tingling, numbness, weakness.  PSYCHIATRY: No anxiety or depression.   MEDICATIONS AT HOME:  Prior to Admission medications   Medication Sig Start Date End Date Taking? Authorizing Provider  ATORVASTATIN CALCIUM PO Take by mouth.    [provider]  carvedilol (COREG) 6.25 MG tablet Take 6.25 mg by mouth 2 (two) times daily with a meal.    [provider]  Erythromycin Base ([place] PO) Take by mouth.    [provider]  glipiZIDE (GLUCOTROL XL) 2.5 MG 24 hr tablet Take 2.5 mg by mouth daily with breakfast.    [provider]  HYDROcodone-acetaminophen (NORCO/VICODIN) 5-325 MG tablet Take 1 tablet by mouth every 4 (four) hours as needed for moderate pain. 05/05/16   Johnn Hai, PA-C  lisinopril (PRINIVIL,ZESTRIL) 10 MG tablet Take 10 mg  by mouth daily.    [provider]  loperamide (IMODIUM) 2 MG capsule Take by mouth as needed for diarrhea or loose stools.    [provider]  metFORMIN (GLUCOPHAGE) 500 MG tablet Take 500 mg by mouth.    [provider]  Multiple Vitamin (MULTIVITAMIN) capsule Take 1 capsule by mouth daily.    [provider]  ranitidine (ZANTAC) 150 MG tablet Take 150 mg by mouth.    [provider]      PHYSICAL EXAMINATION:   VITAL SIGNS: Blood pressure 105/65, pulse 95, temperature 98.3 F (36.8 C), temperature source Oral, resp. rate (!) 34, height 5\' 7"  (1.702 m), weight 65.8 kg (145 lb), SpO2 (!) 79 %.  GENERAL:  84 y.o.-year-old patient lying in the bed with no acute distress.  EYES: Pupils equal, round, reactive to light and accommodation. No scleral icterus. Extraocular muscles intact.  Frail appearance HEENT: Head atraumatic, normocephalic. Oropharynx and nasopharynx clear.  NECK:  Supple, no jugular venous distention. No thyroid enlargement, no tenderness.  LUNGS: Diffuse wheezing/rhonchi throughout. No use of accessory muscles of respiration while on BiPAP.  CARDIOVASCULAR: S1, S2 normal. No murmurs, rubs, or gallops.  ABDOMEN: Soft, nontender, nondistended. Bowel sounds present. No organomegaly or mass.  EXTREMITIES: No pedal edema, cyanosis, or clubbing.  Diffuse muscular atrophy NEUROLOGIC: Cranial nerves II through XII are intact. MAES. Gait not checked.  PSYCHIATRIC: The patient is alert and oriented x 3.  SKIN: No obvious rash, lesion, or ulcer.   LABORATORY PANEL:   CBC Recent Labs  Lab 08/19/17 0042  WBC 14.7*  HGB 11.3*  HCT 34.7*  PLT 172  MCV 95.4  MCH 31.2  MCHC 32.7  RDW 14.5   ------------------------------------------------------------------------------------------------------------------  Chemistries  Recent Labs  Lab 08/19/17 0042  NA 135  K 4.6  CL 93*  CO2 31  GLUCOSE 285*  BUN 31*  CREATININE 1.64*   CALCIUM 8.0*  AST 25  ALT 13*  ALKPHOS 56  BILITOT 0.5   ------------------------------------------------------------------------------------------------------------------ estimated creatinine clearance is 32.3 mL/min (A) (by C-G formula based on SCr of 1.64 mg/dL (H)). ------------------------------------------------------------------------------------------------------------------ No results for input(s): TSH, T4TOTAL, T3FREE, THYROIDAB in the last 72 hours.  Invalid input(s): FREET3   Coagulation profile No results for input(s): INR, PROTIME in the last 168 hours. ------------------------------------------------------------------------------------------------------------------- No results for input(s): DDIMER in the last 72 hours. -------------------------------------------------------------------------------------------------------------------  Cardiac Enzymes Recent Labs  Lab 08/19/17 0042  TROPONINI 0.07*   ------------------------------------------------------------------------------------------------------------------ Invalid input(s): POCBNP  ---------------------------------------------------------------------------------------------------------------  Urinalysis No results found for: COLORURINE, APPEARANCEUR, LABSPEC, PHURINE, GLUCOSEU, HGBUR, BILIRUBINUR, KETONESUR, PROTEINUR, UROBILINOGEN, NITRITE, LEUKOCYTESUR   RADIOLOGY: Dg Chest Portable 1 View  Result Date: 08/19/2017 CLINICAL DATA:  84 year old male with shortness of breath. EXAM: PORTABLE CHEST 1 VIEW COMPARISON:  None. FINDINGS: Patchy area of nodular interstitial coarsening in the left mid to lower lung field concerning for pneumonia. Clinical correlation is recommended. There is no focal consolidation. No large pleural effusion noted. The right costophrenic angle however is excluded from the image. There is no pneumothorax. The cardiac silhouette is within normal  limits. Atherosclerotic calcification of  the aortic arch. No acute osseous pathology. IMPRESSION: Nodular interstitial coarsening of the left lower lung field concerning for pneumonia. Clinical correlation is recommended. Electronically Signed   By: Anner Crete M.D.   On: 08/19/2017 01:20    EKG: Orders placed or performed during the hospital encounter of 08/18/17  . ED EKG  . ED EKG  . EKG 12-Lead  . EKG 12-Lead    IMPRESSION AND PLAN: 1 acute hypoxic hypercarbic respiratory failure Secondary to community-acquired pneumonia and acute on COPD exacerbation Admit to stepdown unit, intensivist notified, BiPAP with weaning as tolerated, bronchodilator therapy scheduled and as needed, supplemental oxygen weaning as tolerated  2 acute CAP Pneumonia protocol, empiric Rocephin/azithromycin for 5-7-day course, follow-up on cultures  3 acute on COPD exacerbation Empiric antibiotics per above, IV Solu-Medrol with tapering as tolerated, aggressive pulmonary toilet with bronchodilator therapy, mucolytic agents, supplemental oxygen with weaning as tolerated   4 chronic tobacco smoking abuse/dependency Stable Smoking cessation counseling and nicotine patch ordered  5 diabetes mellitus type 2 Complete MAR when available, sliding scale insulin with Accu-Cheks as needed for now  6 incomplete MAR Complete when available  7 chronic GERD without esophagitis PPI daily  8 history of coronary artery disease Complete MAR when available Aspirin, statin therapy for now, check lipids in the morning   All the records are reviewed and case discussed with ED provider. Management plans discussed with the patient, family and they are in agreement.  CODE STATUS:full    TOTAL TIME TAKING CARE OF THIS PATIENT: 45 minutes.    Avel Peace Salary M.D on 08/19/2017   Between 7am to 6pm - Pager - [phone]  After 6pm go to [email or web address] - password [email or web address] [facility]  Office  [phone]  CC: Primary care  physician; Gweneth Fritter, MD   Note: This dictation was prepared with Dragon dictation along with smaller phrase technology. Any transcriptional errors that result from this process are unintentional.

## 2017-08-19 NOTE — Progress Notes (Signed)
Admitted for respiratory distress and found to have pneumonia, acute exacerbation, hypoxia with O2 sat 72% on room air when he came. 1.  Community-acquired pneumonia Acute respiratory failure with hypoxia secondary to COPD exacerbation, pneumonia: Continue IV steroids, bronchodilators, oxygen patient is on oxygen 4 L and saturation is around 95%.  He appears comfortable.  He received BiPAP in the emergency room.  Continue to monitor in ICU for borderline respiratory status.  #2 GERD: Continue PPIs 3.  Diabetes mellitus type 2: Abdomen is on hold because of acute renal insufficiency 4 history of CAD: Continue aspirin, beta-blockers, statins 5.  Acute on chronic renal failure: Nephrotoxic agents.  Continue  gentle hydration

## 2017-08-19 NOTE — Progress Notes (Signed)
Wife called to speak with this RN regarding pt and his visitors. Pt's wife requested that we not allow anyone to visit unless they have the patient password. This RN told her that she would have to verify with the patient if that was okay, as he is alert and oriented. Went in to speak with patient and told him that his wife wanted him to only have visitors if they have the patient password, and pt stated that would be fine. Verified with patient that that would mean people would be able to visit ONLY if they have the password, and if they didn't they wouldn't be allowed back to visit him. Pt stated that that was fine.

## 2017-08-19 NOTE — Progress Notes (Signed)
Family Meeting Note  Advance Directive:yes  Today a meeting took place with the Patient and spouse.  Patient is able to participate   The following clinical team members were present during this meeting:MD  The following were discussed:Patient's diagnosis: , Patient's progosis: > 12 months and Goals for treatment: Full Code  Additional follow-up to be provided: prn  Time spent during discussion:20 minutes  Gorden Harms, MD

## 2017-08-19 NOTE — Consult Note (Signed)
PULMONARY / CRITICAL CARE MEDICINE   Name: Micheal [name] MRN: [id number] DOB: 01-22-1933    ADMISSION DATE:  08/18/2017   CONSULTATION DATE:  08/19/2017  REFERRING MD:  Dr Jerelyn Charles  REASON: Acute hypoxic/hypercarbic respiratory failure  HISTORY OF PRESENT ILLNESS:   This is an 84 year old male with a past medical history as listed below who presented to the ED with shortness of breath, exertional dyspnea, cough and chest congestion.  Symptoms have persisted from for about 1-2 weeks and gradually got worse hence presentation decided to seek help.  Upon ED arrival, patient's SPO2 was in the 70s on room air and he was tachycardic.  His chest x-ray showed left lower lobe opacities suggestive of pneumonia.  His WBC was mildly elevated at 14.7 and his  Creatinine was 1.64.   PAST MEDICAL HISTORY :  He  has a past medical history of Balance problem, COPD (chronic obstructive pulmonary disease) ([place]), Coronary artery disease, Diabetes mellitus without complication ([place]), GERD (gastroesophageal reflux disease), Hypercholesteremia, Hypertension, Myocardial infarction, Pneumonia, Shortness of breath dyspnea, and Wheezing.  PAST SURGICAL HISTORY: He  has a past surgical history that includes Appendectomy; Colonoscopy; Cataract extraction w/PHACO (Right, 10/04/2015); Cardiac catheterization; Coronary angioplasty; and Cataract extraction w/PHACO (Left, 12/13/2015).  No Known Allergies  No current facility-administered medications on file prior to encounter.    Current Outpatient Medications on File Prior to Encounter  Medication Sig  . ATORVASTATIN CALCIUM PO Take by mouth.  . carvedilol (COREG) 6.25 MG tablet Take 6.25 mg by mouth 2 (two) times daily with a meal.  . Erythromycin Base ([place] PO) Take by mouth.  Marland Kitchen glipiZIDE (GLUCOTROL XL) 2.5 MG 24 hr tablet Take 2.5 mg by mouth daily with breakfast.  . HYDROcodone-acetaminophen (NORCO/VICODIN) 5-325 MG tablet Take 1 tablet by mouth every 4 (four) hours  as needed for moderate pain.  Marland Kitchen lisinopril (PRINIVIL,ZESTRIL) 10 MG tablet Take 10 mg by mouth daily.  Marland Kitchen loperamide (IMODIUM) 2 MG capsule Take by mouth as needed for diarrhea or loose stools.  . metFORMIN (GLUCOPHAGE) 500 MG tablet Take 500 mg by mouth.  . Multiple Vitamin (MULTIVITAMIN) capsule Take 1 capsule by mouth daily.  . ranitidine (ZANTAC) 150 MG tablet Take 150 mg by mouth.    FAMILY HISTORY:  His has no family status information on file.    SOCIAL HISTORY: He  reports that he has quit smoking. He has never used smokeless tobacco. He reports that he does not drink alcohol.  REVIEW OF SYSTEMS:   Unable to obtain as patient is on continuous BiPAP  SUBJECTIVE:   VITAL SIGNS: BP (!) 96/49   Pulse 81   Temp 97.8 F (36.6 C) (Axillary)   Resp 14   Ht 5\' 7"  (1.702 m)   Wt 145 lb (65.8 kg)   SpO2 99%   BMI 22.71 kg/m   HEMODYNAMICS:    VENTILATOR SETTINGS:    INTAKE / OUTPUT: No intake/output data recorded.  PHYSICAL EXAMINATION: General: Sleeping, NAD Neuro:  Somnolent, follows commands, moves all extremities HEENT: PERRLA, neck is supple, no JVD  Cardiovascular:  RRR, S1/S2, no edema, =2 pulses Lungs: bilateral breath sounds with diffuse rhonchi in all lung fields Abdomen: Normal bowel sounds, no organomegaly Musculoskeletal:  +rom, no deformities Skin: warm and dry  LABS:  BMET Recent Labs  Lab 08/19/17 0042 08/19/17 0424  NA 135  --   K 4.6  --   CL 93*  --   CO2 31  --   BUN 31*  --  CREATININE 1.64* 1.58*  GLUCOSE 285*  --     Electrolytes Recent Labs  Lab 08/19/17 0042  CALCIUM 8.0*    CBC Recent Labs  Lab 08/19/17 0042 08/19/17 0424  WBC 14.7* 14.2*  HGB 11.3* 10.1*  HCT 34.7* 30.8*  PLT 172 162    Coag's No results for input(s): APTT, INR in the last 168 hours.  Sepsis Markers Recent Labs  Lab 08/19/17 0218 08/19/17 0424  LATICACIDVEN 1.6 1.2    ABG No results for input(s): PHART, PCO2ART, PO2ART in the last  168 hours.  Liver Enzymes Recent Labs  Lab 08/19/17 0042  AST 25  ALT 13*  ALKPHOS 56  BILITOT 0.5  ALBUMIN 3.3*    Cardiac Enzymes Recent Labs  Lab 08/19/17 0042  TROPONINI 0.07*    Glucose No results for input(s): GLUCAP in the last 168 hours.  Imaging Dg Chest Portable 1 View  Result Date: 08/19/2017 CLINICAL DATA:  84 year old male with shortness of breath. EXAM: PORTABLE CHEST 1 VIEW COMPARISON:  None. FINDINGS: Patchy area of nodular interstitial coarsening in the left mid to lower lung field concerning for pneumonia. Clinical correlation is recommended. There is no focal consolidation. No large pleural effusion noted. The right costophrenic angle however is excluded from the image. There is no pneumothorax. The cardiac silhouette is within normal limits. Atherosclerotic calcification of the aortic arch. No acute osseous pathology. IMPRESSION: Nodular interstitial coarsening of the left lower lung field concerning for pneumonia. Clinical correlation is recommended. Electronically Signed   By: Anner Crete M.D.   On: 08/19/2017 01:20   STUDIES:  None  CULTURES: Blood cultures x2 Sputum culture  ANTIBIOTICS: Ceftriaxone Azithromycin  SIGNIFICANT EVENTS: 08/19/2017: Admitted  LINES/TUBES: Peripheral IVs  DISCUSSION: 84 year old male presenting with acute hypercarbic respiratory failure secondary to community-acquired pneumonia  ASSESSMENT  Acute hypoxic/hypercarbic respiratory failure secondary to pneumonia Community-acquired pneumonia AKI Acute on chronic COPD exacerbation Type 2 diabetes mellitus History of CAD, GERD, hypertension and hyperlipidemia Tobacco use disorder  PLAN Hemodynamic monitoring per ICU protocol Continuous BiPAP and titrate to nasal cannula as tolerated Nebulized bronchodilators Systemic steroids Antibiotics as above Follow-up cultures, urine Legionella and strep antigen Trend renal indices Gentle IV hydration Smoking  cessation advice given Tonics 40 mg IV daily Glucose monitoring with sliding scale insulin coverage GI and DVT prophylaxis-PPI and subcu heparin  FAMILY  - Updates: No family at bedside. Will update when available.   Niurka Benecke S. [facility] ANP-BC Pulmonary and Critical Care Medicine [facility] Pager [phone] or [phone]  NB: This document was prepared using Dragon voice recognition software and may include unintentional dictation errors.    08/19/2017, 6:02 AM

## 2017-08-20 ENCOUNTER — Other Ambulatory Visit: Payer: Self-pay

## 2017-08-20 ENCOUNTER — Encounter: Payer: Self-pay | Admitting: *Deleted

## 2017-08-20 LAB — HIV ANTIBODY (ROUTINE TESTING W REFLEX): HIV Screen 4th Generation wRfx: NONREACTIVE

## 2017-08-20 LAB — GLUCOSE, CAPILLARY
GLUCOSE-CAPILLARY: 211 mg/dL — AB (ref 65–99)
GLUCOSE-CAPILLARY: 210 mg/dL — AB (ref 65–99)
Glucose-Capillary: 221 mg/dL — ABNORMAL HIGH (ref 65–99)
Glucose-Capillary: 286 mg/dL — ABNORMAL HIGH (ref 65–99)

## 2017-08-20 LAB — PROCALCITONIN: PROCALCITONIN: 1.84 ng/mL

## 2017-08-20 MED ORDER — MELATONIN 5 MG PO TABS
5.0000 mg | ORAL_TABLET | Freq: Every evening | ORAL | Status: DC | PRN
  Administered 2017-08-21: 5 mg via ORAL
  Filled 2017-08-20 (×2): qty 1

## 2017-08-20 MED ORDER — NON FORMULARY: 5.0000 mg | Freq: Every evening | Status: DC | PRN

## 2017-08-20 NOTE — Progress Notes (Signed)
Patient stated that he is okay with anyone visiting him regardless if they know his password.

## 2017-08-20 NOTE — Progress Notes (Signed)
Inpatient Diabetes Program Recommendations  AACE/ADA: New Consensus Statement on Inpatient Glycemic Control (2015)  Target Ranges:  Prepandial:   less than 140 mg/dL      Peak postprandial:   less than 180 mg/dL (1-2 hours)      Critically ill patients:  140 - 180 mg/dL   Results for Micheal [name], Micheal [name] (MRN [id number]) as of 08/20/2017 13:15  Ref. Range 08/19/2017 06:53 08/19/2017 11:31 08/19/2017 11:56 08/19/2017 16:41 08/19/2017 20:07  Glucose-Capillary Latest Ref Range: 65 - 99 mg/dL 204 (H) 160 (H) 151 (H) 130 (H) 248 (H)   Results for Micheal [name], Micheal [name] (MRN [id number]) as of 08/20/2017 13:15  Ref. Range 08/20/2017 07:43 08/20/2017 12:15  Glucose-Capillary Latest Ref Range: 65 - 99 mg/dL 211 (H)  5 units NOVOLOG 221 (H)  5 units NOVOLOG      Home DM Meds:  Metformin 500 mg daily         Glipizide 2.5 mg daily  Current Orders: Novolog Moderate Correction Scale/ SSI (0-15 units) TID AC + HS      Glipizide 2.5 mg daily      Note patient received Solumedrol yesterday.  Last dose given yesterday at 10am.  Now getting Prednisone 40 mg daily.  Note that Glipizide restarted this AM.   MD- Please consider starting Novolog Meal Coverage for this patient while patient getting Prednisone:  Novolog 3 units TID with meals (hold if pt eats <50% of meal)     --Will follow patient during hospitalization--  Wyn Quaker RN, MSN, CDE Diabetes Coordinator Inpatient Glycemic Control Team Team Pager: [phone] (8a-5p)

## 2017-08-20 NOTE — Progress Notes (Signed)
Micheal [name] at [place] NAME: Micheal [name]    MR#:  [id number]  DATE OF BIRTH:  1932/06/13  SUBJECTIVE: He is breathing better than yesterday, no hypoxia, he is on room air O2 saturation 96% on room air.  Micheal [name]  CHIEF COMPLAINT:   Chief Complaint  Patient presents with  . Respiratory Distress    REVIEW OF SYSTEMS:   ROS CONSTITUTIONAL: No fever, fatigue or weakness.  EYES: No blurred or double vision.  EARS, NOSE, AND THROAT: No tinnitus or ear pain.  RESPIRATORY: She has slight cough.  CARDIOVASCULAR: No chest pain, orthopnea, edema.  GASTROINTESTINAL: No nausea, vomiting, diarrhea or abdominal pain.  GENITOURINARY: No dysuria, hematuria.  ENDOCRINE: No polyuria, nocturia,  HEMATOLOGY: No anemia, easy bruising or bleeding SKIN: No rash or lesion. MUSCULOSKELETAL: No joint pain or arthritis.   NEUROLOGIC: No tingling, numbness, weakness.  PSYCHIATRY: No anxiety or depression.   DRUG ALLERGIES:  No Known Allergies  VITALS:  Blood pressure (!) 125/55, pulse 86, temperature (!) 97.1 F (36.2 C), temperature source Oral, resp. rate 20, height 5\' 7"  (1.702 m), weight 65.8 kg (145 lb), SpO2 96 %.  PHYSICAL EXAMINATION:  GENERAL:  84 y.o.-year-old patient lying in the bed with no acute distress.  EYES: Pupils equal, round, reactive to light and accommodation. No scleral icterus. Extraocular muscles intact.  HEENT: Head atraumatic, normocephalic. Oropharynx and nasopharynx clear.  NECK:  Supple, no jugular venous distention. No thyroid enlargement, no tenderness.  LUNGS: Patient has faint expiratory wheeze bilaterally, not using accessory muscles of respiration CARDIOVASCULAR: S1, S2 normal. No murmurs, rubs, or gallops.  ABDOMEN: Soft, nontender, nondistended. Bowel sounds present. No organomegaly or mass.  EXTREMITIES: No pedal edema, cyanosis, or clubbing.  NEUROLOGIC: Cranial nerves II through XII are intact. Muscle strength 5/5 in  all extremities. Sensation intact. Gait not checked.  PSYCHIATRIC: The patient is alert and oriented x 3.  SKIN: No obvious rash, lesion, or ulcer.    LABORATORY PANEL:   CBC Recent Labs  Lab 08/19/17 0424  WBC 14.2*  HGB 10.1*  HCT 30.8*  PLT 162   ------------------------------------------------------------------------------------------------------------------  Chemistries  Recent Labs  Lab 08/19/17 0042 08/19/17 0424  NA 135  --   K 4.6  --   CL 93*  --   CO2 31  --   GLUCOSE 285*  --   BUN 31*  --   CREATININE 1.64* 1.58*  CALCIUM 8.0*  --   MG  --  2.1  AST 25  --   ALT 13*  --   ALKPHOS 56  --   BILITOT 0.5  --    ------------------------------------------------------------------------------------------------------------------  Cardiac Enzymes Recent Labs  Lab 08/19/17 0042  TROPONINI 0.07*   ------------------------------------------------------------------------------------------------------------------  RADIOLOGY:  Dg Chest Portable 1 View  Result Date: 08/19/2017 CLINICAL DATA:  84 year old male with shortness of breath. EXAM: PORTABLE CHEST 1 VIEW COMPARISON:  None. FINDINGS: Patchy area of nodular interstitial coarsening in the left mid to lower lung field concerning for pneumonia. Clinical correlation is recommended. There is no focal consolidation. No large pleural effusion noted. The right costophrenic angle however is excluded from the image. There is no pneumothorax. The cardiac silhouette is within normal limits. Atherosclerotic calcification of the aortic arch. No acute osseous pathology. IMPRESSION: Nodular interstitial coarsening of the left lower lung field concerning for pneumonia. Clinical correlation is recommended. Electronically Signed   By: Anner Crete M.D.   On: 08/19/2017 01:20  EKG:   Orders placed or performed during the hospital encounter of 08/18/17  . ED EKG  . ED EKG  . EKG 12-Lead  . EKG 12-Lead    ASSESSMENT AND  PLAN:   #1 acute respiratory failure on admission secondary to community-acquired pneumonia in the left lung: Patient received ICU support with transient BiPAP, oxygen, IV antibiotics, patient feels better today, he is on room air now.  Continue the Rocephin, azithromycin, transferred out of ICU. 2.  Diabetes mellitus type 2: Patient is on Glucotrol, sliding scale insulin with coverage.,  Continue lisinopril. 3.  COPD exacerbation: Patient continues to smoke advised to quit, continue bronchodilators,  prednisone. Acute on chronic renal failure secondary to #1: No prior labs available last year.  Patient BMP was in 2016.  Suspect he has chronic kidney disease.,  Diabetic nephropathy.  Hold metformin secondary to that. 4.  Deconditioning: Physical therapy evaluation     All the records are reviewed and case discussed with Care Management/Social Workerr. Management plans discussed with the patient, family and they are in agreement.  CODE STATUS:  full code  TOTAL TIME TAKING CARE OF THIS PATIENT: 35 minutes.   POSSIBLE D/C IN 1-2DAYS, DEPENDING ON CLINICAL CONDITION.   Micheal [name] M.D on 08/20/2017 at 12:28 PM  Between 7am to 6pm - Pager - [phone]  After 6pm go to [email or web address] - password [email or web address] [facility]  Office  [phone]  CC: Primary care physician; Gweneth Fritter, MD   Note: This dictation was prepared with Dragon dictation along with smaller phrase technology. Any transcriptional errors that result from this process are unintentional.

## 2017-08-21 LAB — PROCALCITONIN: Procalcitonin: 1.1 ng/mL

## 2017-08-21 LAB — GLUCOSE, CAPILLARY
GLUCOSE-CAPILLARY: 153 mg/dL — AB (ref 65–99)
Glucose-Capillary: 347 mg/dL — ABNORMAL HIGH (ref 65–99)

## 2017-08-21 MED ORDER — IPRATROPIUM-ALBUTEROL 0.5-2.5 (3) MG/3ML IN SOLN
3.0000 mL | Freq: Three times a day (TID) | RESPIRATORY_TRACT | Status: DC
  Administered 2017-08-21: 3 mL via RESPIRATORY_TRACT
  Filled 2017-08-21: qty 3

## 2017-08-21 MED ORDER — FLUTICASONE-SALMETEROL 100-50 MCG/DOSE IN AEPB: 1.0000 | INHALATION_SPRAY | Freq: Two times a day (BID) | RESPIRATORY_TRACT | 0 refills | Status: DC

## 2017-08-21 MED ORDER — TIOTROPIUM BROMIDE MONOHYDRATE 18 MCG IN CAPS: 18.0000 ug | ORAL_CAPSULE | Freq: Every day | RESPIRATORY_TRACT | 2 refills | Status: DC

## 2017-08-21 MED ORDER — PREDNISONE 10 MG (21) PO TBPK: ORAL_TABLET | ORAL | 0 refills | Status: DC

## 2017-08-21 MED ORDER — AZITHROMYCIN 250 MG PO TABS: ORAL_TABLET | ORAL | 0 refills | Status: AC

## 2017-08-21 MED ORDER — ALBUTEROL SULFATE HFA 108 (90 BASE) MCG/ACT IN AERS: 2.0000 | INHALATION_SPRAY | Freq: Four times a day (QID) | RESPIRATORY_TRACT | 2 refills | Status: DC | PRN

## 2017-08-21 MED ORDER — AMOXICILLIN-POT CLAVULANATE 875-125 MG PO TABS: 1.0000 | ORAL_TABLET | Freq: Two times a day (BID) | ORAL | 0 refills | Status: AC

## 2017-08-21 NOTE — Discharge Summary (Signed)
Micheal [name], is a 84 y.o. male  DOB 02-13-1933  MRN [id number].  Admission date:  08/18/2017  Admitting Physician  Gorden Harms, MD  Discharge Date:  08/21/2017   Primary MD  Gweneth Fritter, MD  Recommendations for primary care physician for things to follow:   Follow-up with PCP in 1 week   Admission Diagnosis  Hypoxia [R09.02] COPD exacerbation ([place]) [J44.1] Community acquired pneumonia of left lower lobe of lung ([place]) [J18.1]   Discharge Diagnosis  Hypoxia [R09.02] COPD exacerbation ([place]) [J44.1] Community acquired pneumonia of left lower lobe of lung ([place]) [J18.1]    Active Problems:   Respiratory failure with hypoxia and hypercapnia ([place])   Community acquired pneumonia of left lower lobe of lung ([place])      Past Medical History:  Diagnosis Date  . Balance problem    INTERMITTENT WITH NEGATIVE WORKUP  . COPD (chronic obstructive pulmonary disease) ([place])   . Coronary artery disease   . Diabetes mellitus without complication ([place])   . GERD (gastroesophageal reflux disease)   . Hypercholesteremia   . Hypertension   . Myocardial infarction ([place])    2005  . Pneumonia    RECENT 5/17  . Shortness of breath dyspnea   . Wheezing     Past Surgical History:  Procedure Laterality Date  . APPENDECTOMY    . CARDIAC CATHETERIZATION    . CATARACT EXTRACTION W/PHACO Right 10/04/2015   Procedure: CATARACT EXTRACTION PHACO AND INTRAOCULAR LENS PLACEMENT (IOC);  Surgeon: [name], MD;  Location: [facility] ORS;  Service: Ophthalmology;  Laterality: Right;  [place] 02:06 AP% 17.9 CDE 22.65 fluid pack lot # [id number]  . CATARACT EXTRACTION W/PHACO Left 12/13/2015   Procedure: CATARACT EXTRACTION PHACO AND INTRAOCULAR LENS PLACEMENT ([place]);  Surgeon: [name], MD;  Location: [facility] ORS;  Service: Ophthalmology;   Laterality: Left;  [place] 1.39 AP% 25.3 CDE 26.37 Fluid pack lot # [id number]  . COLONOSCOPY    . CORONARY ANGIOPLASTY     Stent placement       History of present illness and  Hospital Course:     Kindly see H&P for history of present illness and admission details, please review complete Labs, Consult reports and Test reports for all details in brief  HPI  from the history and physical done on the day of admission 84 year old male patient with history of PD, GERD, diabetes mellitus type 2, CAD, balance problems came in because of worsening shortness of breath, cough, wheezing and found to have hypoxia with O2 sat 70% on room air, admitted to ICU for respiratory distress due to pneumonia with acute respiratory failure with hypoxia.   Hospital Course  #1 acute respiratory failure with hypoxia secondary to community-acquired pneumonia on the left side, patient required BiPAP support and monitored in stepdown unit.  Patient received IV Rocephin, Zithromax, BiPAP support along with bronchodilators.  Transferred out of ICU after patient came off the BiPAP.  Patient O2 saturations more than 95 on room air at rest but dropped to 84% on exertion so patient qualified for home oxygen 2 L.  Patient will be given Augmentin, azithromycin to complete the course of antibiotic for pneumonia. 2.  COPD exacerbation, patient had a lot of wheezing when he came but lungs are better today without any wheezing.  Discharged with tapering course of prednisone, albuterol inhaler, Spiriva.  Advised the patient to quit smoking. 2.  History of CAD, patient is on aspirin, statin, Coreg. 3.  Diabetes mellitus type  2: Hyperglycemia secondary to steroids, patient is on Glucotrol, family at home. 4.  Acute on chronic renal failure, and may have CKD stage III secondary to diabetes. 4.  Elevated pro-calcitonin secondary to pneumonia, pro-calcitonin elevated to 1.94 on admission but decreased to 1.10.    Discharge Condition:  Stable   Follow UP  Follow-up Information    Gweneth Fritter, MD. Schedule an appointment as soon as possible for a visit in 1 week(s).   Specialty:  Internal Medicine Contact information: [address] [place] [address] [phone]             Discharge Instructions  and  Discharge Medications      Allergies as of 08/21/2017   No Known Allergies     Medication List    STOP taking these medications   HYDROcodone-acetaminophen 5-325 MG tablet Commonly known as:  NORCO/VICODIN     TAKE these medications   albuterol 108 (90 Base) MCG/ACT inhaler Commonly known as:  PROVENTIL HFA;VENTOLIN HFA Inhale 2 puffs into the lungs every 6 (six) hours as needed for wheezing or shortness of breath.   amoxicillin-clavulanate 875-125 MG tablet Commonly known as:  AUGMENTIN Take 1 tablet by mouth 2 (two) times daily for 14 days.   atorvastatin 40 MG tablet Commonly known as:  LIPITOR Take 1 tablet by mouth daily.   azithromycin 250 MG tablet Commonly known as:  ZITHROMAX Z-PAK Take 2 tablets (500 mg) on  Day 1,  followed by 1 tablet (250 mg) once daily on Days 2 through 5.   carvedilol 6.25 MG tablet Commonly known as:  COREG Take 6.25 mg by mouth 2 (two) times daily with a meal.   Fluticasone-Salmeterol 100-50 MCG/DOSE Aepb Commonly known as:  ADVAIR DISKUS Inhale 1 puff into the lungs 2 (two) times daily.   glipiZIDE 2.5 MG 24 hr tablet Commonly known as:  GLUCOTROL XL Take 2.5 mg by mouth daily with breakfast.   lisinopril 10 MG tablet Commonly known as:  PRINIVIL,ZESTRIL Take 10 mg by mouth daily.   loperamide 2 MG capsule Commonly known as:  IMODIUM Take by mouth as needed for diarrhea or loose stools.   metFORMIN 500 MG tablet Commonly known as:  GLUCOPHAGE Take 500 mg by mouth 2 (two) times daily with a meal.   multivitamin capsule Take 1 capsule by mouth daily.   predniSONE 10 MG (21) Tbpk tablet Commonly known as:  STERAPRED UNI-PAK 21  TAB Taper by 10 mg p.o. daily.   tiotropium 18 MCG inhalation capsule Commonly known as:  SPIRIVA HANDIHALER Place 1 capsule (18 mcg total) into inhaler and inhale daily.            Durable Medical Equipment  (From admission, onward)        Start     Ordered   08/21/17 1336  For home use only DME oxygen  Once    Question Answer Comment  Mode or (Route) Nasal cannula   Liters per Minute 2   Frequency Continuous (stationary and portable oxygen unit needed)   Oxygen conserving device Yes   Oxygen delivery system Gas      08/21/17 1335        Diet and Activity recommendation: See Discharge Instructions above   Consults obtained; intensivist   Major procedures and Radiology Reports - PLEASE review detailed and final reports for all details, in brief -      Dg Chest Portable 1 View  Result Date: 08/19/2017 CLINICAL DATA:  84 year old male with shortness of breath. EXAM: PORTABLE CHEST 1 VIEW COMPARISON:  None. FINDINGS: Patchy area of nodular interstitial coarsening in the left mid to lower lung field concerning for pneumonia. Clinical correlation is recommended. There is no focal consolidation. No large pleural effusion noted. The right costophrenic angle however is excluded from the image. There is no pneumothorax. The cardiac silhouette is within normal limits. Atherosclerotic calcification of the aortic arch. No acute osseous pathology. IMPRESSION: Nodular interstitial coarsening of the left lower lung field concerning for pneumonia. Clinical correlation is recommended. Electronically Signed   By: Anner Crete M.D.   On: 08/19/2017 01:20    Micro Results     Recent Results (from the past 240 hour(s))  Blood culture (routine x 2)     Status: None (Preliminary result)   Collection Time: 08/19/17  2:18 AM  Result Value Ref Range Status   Specimen Description BLOOD LEFT FA  Final   Special Requests   Final    BOTTLES DRAWN AEROBIC AND ANAEROBIC Blood Culture  adequate volume   Culture   Final    NO GROWTH 2 DAYS Performed at [facility], [address], [place], [place] [address]    Report Status PENDING  Incomplete  Blood culture (routine x 2)     Status: None (Preliminary result)   Collection Time: 08/19/17  2:18 AM  Result Value Ref Range Status   Specimen Description BLOOD LEFT FA  Final   Special Requests   Final    BOTTLES DRAWN AEROBIC AND ANAEROBIC Blood Culture adequate volume   Culture   Final    NO GROWTH 2 DAYS Performed at [facility], [address] [place], [place] [address]    Report Status PENDING  Incomplete  MRSA PCR Screening     Status: None   Collection Time: 08/19/17  3:33 AM  Result Value Ref Range Status   MRSA by PCR NEGATIVE NEGATIVE Final    Comment:        The GeneXpert MRSA Assay (FDA approved for NASAL specimens only), is one component of a comprehensive MRSA colonization surveillance program. It is not intended to diagnose MRSA infection nor to guide or monitor treatment for MRSA infections. Performed at [facility], [address] [place], [place] [address]        Today   Subjective:   Micheal [name] today has no headache,no chest abdominal pain,no new weakness tingling or numbness, feels much better wants to go home today.   Objective:   Blood pressure 134/66, pulse 77, temperature (!) 97.5 F (36.4 C), temperature source Oral, resp. rate 18, height 5\' 7"  (1.702 m), weight 65.8 kg (145 lb), SpO2 100 %.   Intake/Output Summary (Last 24 hours) at 08/21/2017 1337 Last data filed at 08/21/2017 1040 Gross per 24 hour  Intake 1050 ml  Output 525 ml  Net 525 ml    Exam Awake Alert, Oriented x 3, No new F.N deficits, Normal affect [place].AT,PERRAL Supple Neck,No JVD, No cervical lymphadenopathy appriciated.  Symmetrical Chest wall movement, Good air movement bilaterally, CTAB RRR,No Gallops,Rubs or new Murmurs, No Parasternal Heave +ve B.Sounds, Abd Soft,  Non tender, No organomegaly appriciated, No rebound -guarding or rigidity. No Cyanosis, Clubbing or edema, No new Rash or bruise  Data Review   CBC w Diff:  Lab Results  Component Value Date   WBC 14.2 (H) 08/19/2017   HGB 10.1 (L) 08/19/2017   HCT 30.8 (L) 08/19/2017   PLT 162  08/19/2017    CMP:  Lab Results  Component Value Date   NA 135 08/19/2017   K 4.6 08/19/2017   CL 93 (L) 08/19/2017   CO2 31 08/19/2017   BUN 31 (H) 08/19/2017   CREATININE 1.58 (H) 08/19/2017   PROT 6.7 08/19/2017   ALBUMIN 3.3 (L) 08/19/2017   BILITOT 0.5 08/19/2017   ALKPHOS 56 08/19/2017   AST 25 08/19/2017   ALT 13 (L) 08/19/2017  .   Total Time in preparing paper work, data evaluation and todays exam - 71 minutes  [name] M.D on 08/21/2017 at 1:37 PM    Note: This dictation was prepared with Dragon dictation along with smaller phrase technology. Any transcriptional errors that result from this process are unintentional.

## 2017-08-21 NOTE — Progress Notes (Signed)
SATURATION QUALIFICATIONS: (This note is used to comply with regulatory documentation for home oxygen)  Patient Saturations on Room Air at Rest = 92%  Patient Saturations on Room Air while Ambulating = 84%  Patient Saturations on 2 Liters of oxygen while Ambulating = 95%  Please briefly explain why patient needs home oxygen: pts oxygen drops while ambulating wiothout o2 at 2L

## 2017-08-21 NOTE — Progress Notes (Signed)
Inpatient Diabetes Program Recommendations  AACE/ADA: New Consensus Statement on Inpatient Glycemic Control (2015)  Target Ranges:  Prepandial:   less than 140 mg/dL      Peak postprandial:   less than 180 mg/dL (1-2 hours)      Critically ill patients:  140 - 180 mg/dL   Lab Results  Component Value Date   GLUCAP 347 (H) 08/21/2017   HGBA1C 7.4 (H) 08/19/2017  Results for TAIJUAN, SERVISS (MRN [id number]) as of 08/21/2017 12:42  Ref. Range 08/20/2017 12:15 08/20/2017 16:56 08/20/2017 21:10 08/21/2017 07:33 08/21/2017 11:38  Glucose-Capillary Latest Ref Range: 65 - 99 mg/dL 221 (H) 210 (H) 286 (H) 153 (H) 347 (H)   Home DM Meds:  Metformin 500 mg daily                               Glipizide 2.5 mg daily  Current Orders: Novolog Moderate Correction Scale/ SSI (0-15 units) TID AC + HS                            Glipizide 2.5 mg daily, Prednisone 40 mg q AM  Inpatient Diabetes Program Recommendations:    While in the hospital, consider adding Novolog meal coverage 3 units tid with meals.   Thanks  Adah Perl, RN, BC-ADM Inpatient Diabetes Coordinator Pager [phone] (8a-5p)

## 2017-08-21 NOTE — Evaluation (Signed)
Physical Therapy Evaluation Patient Details Name: MATTHEWJAMES PETRASEK MRN: [id number] DOB: 12-Sep-1932 Today's Date: 08/21/2017   History of Present Illness  presented to ER secondary to cough, congestion and SOB; admitted with acute on chronic hypoxic, hypercapnic respiratory failure due to PNA/COPD exacerbation.  Clinical Impression  Upon evaluation, patient alert and oriented; follows all commands and demonstrates good insight/safety awareness.  Bilat UE/LE strength and ROM grossly symmetrical and WFL.  Able to complete bed mobility with mod indep; sit/stand, basic transfers and gait (230') without assist device, sup/mod indep.  Mild sway and intermittent scissoring with head turns, direction change, but patient able to self-correct with LE step strategy.  Reports gait performance is at/near baseline for him and endorses no acute change in functional ability. Maintains sats >91% on 2L O2 with exertional activity; anticipate need for home O2 at discharge. Will complete order at this time; patient at baseline level of functional ability. No acute PT needs identified.  Would benefit from continued mobilization in room, around unit with nursing staff throughout remaining hospital stay to maintain strength/endurance.    Follow Up Recommendations No PT follow up    Equipment Recommendations       Recommendations for Other Services       Precautions / Restrictions Precautions Precautions: Fall Restrictions Weight Bearing Restrictions: No      Mobility  Bed Mobility Overal bed mobility: Modified Independent                Transfers Overall transfer level: Needs assistance   Transfers: Sit to/from Stand Sit to Stand: Supervision            Ambulation/Gait Ambulation/Gait assistance: Supervision Ambulation Distance (Feet): 230 Feet Assistive device: None   Gait velocity: 10' walk time, 6-7 seconds   General Gait Details: reciprocal stepping pattern with fair trunk rotation  and arm swing; mild instability (intermittent scissoring) with head turns, dynamic gait components, but no overt buckling or LOB.  Able to self-recovery any balance distrubances with LE step strategy.  Stairs            Wheelchair Mobility    Modified Rankin (Stroke Patients Only)       Balance Overall balance assessment: Needs assistance Sitting-balance support: No upper extremity supported;Feet supported Sitting balance-Leahy Scale: Good     Standing balance support: No upper extremity supported Standing balance-Leahy Scale: Fair                               Pertinent Vitals/Pain Pain Assessment: No/denies pain    Home Living Family/patient expects to be discharged to:: Private residence Living Arrangements: Spouse/significant other Available Help at Discharge: Family Type of Home: House Home Access: Ramped entrance     Home Layout: One level Home Equipment: None      Prior Function Level of Independence: Independent         Comments: Indep with ADLs, household and community mobilization wtihout assist device; no home O2.  + driving.  Denies fall history.     Hand Dominance   Dominant Hand: Right    Extremity/Trunk Assessment   Upper Extremity Assessment Upper Extremity Assessment: Overall WFL for tasks assessed    Lower Extremity Assessment Lower Extremity Assessment: Overall WFL for tasks assessed       Communication   Communication: No difficulties  Cognition Arousal/Alertness: Awake/alert Behavior During Therapy: WFL for tasks assessed/performed Overall Cognitive Status: Within Functional Limits for tasks assessed  General Comments      Exercises Other Exercises Other Exercises: Verbally reviewed importance and functional implications for O2 tubing management (patient reports has used O2 in home before) to minimize fall risk; patietn voiced understanding.    Assessment/Plan    PT Assessment Patent does not need any further PT services  PT Problem List         PT Treatment Interventions      PT Goals (Current goals can be found in the Care Plan section)  Acute Rehab PT Goals Patient Stated Goal: to return home at discharge PT Goal Formulation: All assessment and education complete, DC therapy Time For Goal Achievement: 08/21/17    Frequency     Barriers to discharge        Co-evaluation               AM-PAC PT "6 Clicks" Daily Activity  Outcome Measure Difficulty turning over in bed (including adjusting bedclothes, sheets and blankets)?: None Difficulty moving from lying on back to sitting on the side of the bed? : None Difficulty sitting down on and standing up from a chair with arms (e.g., wheelchair, bedside commode, etc,.)?: None Help needed moving to and from a bed to chair (including a wheelchair)?: None Help needed walking in hospital room?: A Little Help needed climbing 3-5 steps with a railing? : A Little 6 Click Score: 22    End of Session Equipment Utilized During Treatment: Gait belt;Oxygen Activity Tolerance: Patient tolerated treatment well Patient left: in bed;with call bell/phone within reach;with bed alarm set Nurse Communication: Mobility status PT Visit Diagnosis: Difficulty in walking, not elsewhere classified (R26.2)    Time: 1448-1856 PT Time Calculation (min) (ACUTE ONLY): 14 min   Charges:   PT Evaluation $PT Eval Low Complexity: 1 Low     PT G Codes:        Andranik Jeune H. Owens Shark, PT, DPT, NCS 08/21/17, 1:21 PM [phone]

## 2017-08-21 NOTE — Progress Notes (Signed)
Micheal [name] to be D/C'd Home per MD order.  Discussed prescriptions and follow up appointments with the patient. Prescriptions given to patient, medication list explained in detail. Pt verbalized understanding.  Allergies as of 08/21/2017   No Known Allergies     Medication List    STOP taking these medications   HYDROcodone-acetaminophen 5-325 MG tablet Commonly known as:  NORCO/VICODIN     TAKE these medications   albuterol 108 (90 Base) MCG/ACT inhaler Commonly known as:  PROVENTIL HFA;VENTOLIN HFA Inhale 2 puffs into the lungs every 6 (six) hours as needed for wheezing or shortness of breath.   amoxicillin-clavulanate 875-125 MG tablet Commonly known as:  AUGMENTIN Take 1 tablet by mouth 2 (two) times daily for 14 days.   atorvastatin 40 MG tablet Commonly known as:  LIPITOR Take 1 tablet by mouth daily.   azithromycin 250 MG tablet Commonly known as:  ZITHROMAX Z-PAK Take 2 tablets (500 mg) on  Day 1,  followed by 1 tablet (250 mg) once daily on Days 2 through 5.   carvedilol 6.25 MG tablet Commonly known as:  COREG Take 6.25 mg by mouth 2 (two) times daily with a meal.   Fluticasone-Salmeterol 100-50 MCG/DOSE Aepb Commonly known as:  ADVAIR DISKUS Inhale 1 puff into the lungs 2 (two) times daily.   glipiZIDE 2.5 MG 24 hr tablet Commonly known as:  GLUCOTROL XL Take 2.5 mg by mouth daily with breakfast.   lisinopril 10 MG tablet Commonly known as:  PRINIVIL,ZESTRIL Take 10 mg by mouth daily.   loperamide 2 MG capsule Commonly known as:  IMODIUM Take by mouth as needed for diarrhea or loose stools.   metFORMIN 500 MG tablet Commonly known as:  GLUCOPHAGE Take 500 mg by mouth 2 (two) times daily with a meal.   multivitamin capsule Take 1 capsule by mouth daily.   predniSONE 10 MG (21) Tbpk tablet Commonly known as:  STERAPRED UNI-PAK 21 TAB Taper by 10 mg p.o. daily.   tiotropium 18 MCG inhalation capsule Commonly known as:  SPIRIVA HANDIHALER Place  1 capsule (18 mcg total) into inhaler and inhale daily.            Durable Medical Equipment  (From admission, onward)        Start     Ordered   08/21/17 1336  For home use only DME oxygen  Once    Question Answer Comment  Mode or (Route) Nasal cannula   Liters per Minute 2   Frequency Continuous (stationary and portable oxygen unit needed)   Oxygen conserving device Yes   Oxygen delivery system Gas      08/21/17 1335      Vitals:   08/21/17 1314 08/21/17 1323  BP:  134/66  Pulse:  77  Resp:  18  Temp:  (!) 97.5 F (36.4 C)  SpO2: 91% 100%    Skin clean, dry and intact without evidence of skin break down, no evidence of skin tears noted. IV catheter discontinued intact. Site without signs and symptoms of complications. Dressing and pressure applied. Pt denies pain at this time. No complaints noted. Pt set up with oxygen.   An After Visit Summary was printed and given to the patient. Patient escorted via [place], and D/C home via private auto.  Sharalyn Ink

## 2017-08-21 NOTE — Care Management Note (Signed)
Case Management Note  Patient Details  Name: Micheal [name] MRN: [id number] Date of Birth: 02-19-1933   Patient admitted from home with PNA and COPD.  Patient lives at home with wife.  Step daughter also lives there for support. PCP bernstein.  Patient states that he has a RW and cane in the home.  Patient with qualifying home o2 sats.  Patient agreeable to home O2 and RN.  Patient states that he does not have a preference of agency.  Referral made to [facility] with [facility].  Portable tank to be delivered to room prior to discharge.  RNCM signing off.     Subjective/Objective:                    Action/Plan:   Expected Discharge Date:  08/21/17               Expected Discharge Plan:  [place]  In-House Referral:     Discharge planning Services  CM Consult  Post Acute Care Choice:  Durable Medical Equipment, Home Health Choice offered to:  Patient  DME Arranged:  Oxygen DME Agency:  [place] Arranged:  RN [facility] Agency:  [facility]  Status of Service:  Completed, signed off  If discussed at [place] of Stay Meetings, dates discussed:    Additional Comments:  Beverly Sessions, RN 08/21/2017, 2:25 PM

## 2017-08-21 NOTE — Plan of Care (Signed)
Put pt on 2L of oxygen due to o2 sats around 92% while at rest. Dropped to mid 80's while ambulating

## 2017-08-21 NOTE — Progress Notes (Signed)
Patient seen and examined, patient is eager to go home.  Request physical therapy evaluation, ambulatory oxygen saturation and likely discharge later today pendingabove workup.

## 2017-08-24 LAB — CULTURE, BLOOD (ROUTINE X 2)
CULTURE: NO GROWTH
CULTURE: NO GROWTH
SPECIAL REQUESTS: ADEQUATE
Special Requests: ADEQUATE

## 2017-08-26 ENCOUNTER — Telehealth: Payer: Self-pay | Admitting: Internal Medicine

## 2017-08-26 NOTE — Telephone Encounter (Signed)
-----   Message from Ace Gins sent at 08/22/2017  2:49 PM EDT ----- Regarding: RE: hfu LMOV to call and schedule  ----- Message ----- From: Renelda Mom, LPN Sent: 06/23/4495   1:15 PM To: [facility] Support Pool Subject: FW: hfu                                        Please schedule pt in a hosp f/u in a 30 minute slot for pneumonia in 2-4 weeks with DR.  Thanks, Misty ----- Message ----- From: Laverle Hobby, MD Sent: 08/21/2017  11:42 AM To: [facility] Clinical Pool Subject: hfu                                            Pt needs HFU, any provider, 2-4 weeks for pneumonia.

## 2017-08-26 NOTE — Telephone Encounter (Signed)
LMOV to call and schedule  °

## 2017-09-03 NOTE — Telephone Encounter (Signed)
Lmov for patient to schedule ° ° °

## 2018-01-09 ENCOUNTER — Inpatient Hospital Stay
Admission: EM | Admit: 2018-01-09 | Discharge: 2018-01-12 | DRG: 494 | Disposition: A | Discharge status: Home Health | Payer: Medicare Other | Attending: Orthopedic Surgery | Admitting: Orthopedic Surgery

## 2018-01-09 ENCOUNTER — Emergency Department: Payer: Medicare Other

## 2018-01-09 ENCOUNTER — Encounter: Payer: Self-pay | Admitting: Orthopedic Surgery

## 2018-01-09 DIAGNOSIS — S82892A Other fracture of left lower leg, initial encounter for closed fracture: Secondary | ICD-10-CM

## 2018-01-09 DIAGNOSIS — W19XXXA Unspecified fall, initial encounter: Secondary | ICD-10-CM

## 2018-01-09 DIAGNOSIS — S82842A Displaced bimalleolar fracture of left lower leg, initial encounter for closed fracture: Principal | ICD-10-CM | POA: Diagnosis present

## 2018-01-09 DIAGNOSIS — W11XXXA Fall on and from ladder, initial encounter: Secondary | ICD-10-CM | POA: Diagnosis present

## 2018-01-09 DIAGNOSIS — J449 Chronic obstructive pulmonary disease, unspecified: Secondary | ICD-10-CM | POA: Diagnosis present

## 2018-01-09 DIAGNOSIS — E7800 Pure hypercholesterolemia, unspecified: Secondary | ICD-10-CM | POA: Diagnosis present

## 2018-01-09 DIAGNOSIS — I252 Old myocardial infarction: Secondary | ICD-10-CM | POA: Diagnosis not present

## 2018-01-09 DIAGNOSIS — I959 Hypotension, unspecified: Secondary | ICD-10-CM | POA: Diagnosis not present

## 2018-01-09 DIAGNOSIS — Z7984 Long term (current) use of oral hypoglycemic drugs: Secondary | ICD-10-CM | POA: Diagnosis not present

## 2018-01-09 DIAGNOSIS — R339 Retention of urine, unspecified: Secondary | ICD-10-CM | POA: Diagnosis present

## 2018-01-09 DIAGNOSIS — N189 Chronic kidney disease, unspecified: Secondary | ICD-10-CM | POA: Diagnosis present

## 2018-01-09 DIAGNOSIS — I4510 Unspecified right bundle-branch block: Secondary | ICD-10-CM | POA: Diagnosis present

## 2018-01-09 DIAGNOSIS — Z79899 Other long term (current) drug therapy: Secondary | ICD-10-CM | POA: Diagnosis not present

## 2018-01-09 DIAGNOSIS — E1122 Type 2 diabetes mellitus with diabetic chronic kidney disease: Secondary | ICD-10-CM | POA: Diagnosis present

## 2018-01-09 DIAGNOSIS — Y92009 Unspecified place in unspecified non-institutional (private) residence as the place of occurrence of the external cause: Secondary | ICD-10-CM | POA: Diagnosis not present

## 2018-01-09 DIAGNOSIS — Z7951 Long term (current) use of inhaled steroids: Secondary | ICD-10-CM

## 2018-01-09 DIAGNOSIS — Z87891 Personal history of nicotine dependence: Secondary | ICD-10-CM | POA: Diagnosis not present

## 2018-01-09 DIAGNOSIS — Z7982 Long term (current) use of aspirin: Secondary | ICD-10-CM | POA: Diagnosis not present

## 2018-01-09 DIAGNOSIS — Z9841 Cataract extraction status, right eye: Secondary | ICD-10-CM

## 2018-01-09 DIAGNOSIS — I129 Hypertensive chronic kidney disease with stage 1 through stage 4 chronic kidney disease, or unspecified chronic kidney disease: Secondary | ICD-10-CM | POA: Diagnosis present

## 2018-01-09 DIAGNOSIS — Z9842 Cataract extraction status, left eye: Secondary | ICD-10-CM

## 2018-01-09 DIAGNOSIS — Z955 Presence of coronary angioplasty implant and graft: Secondary | ICD-10-CM

## 2018-01-09 DIAGNOSIS — Z961 Presence of intraocular lens: Secondary | ICD-10-CM | POA: Diagnosis present

## 2018-01-09 DIAGNOSIS — I2510 Atherosclerotic heart disease of native coronary artery without angina pectoris: Secondary | ICD-10-CM | POA: Diagnosis present

## 2018-01-09 DIAGNOSIS — K219 Gastro-esophageal reflux disease without esophagitis: Secondary | ICD-10-CM | POA: Diagnosis present

## 2018-01-09 LAB — CBC WITH DIFFERENTIAL/PLATELET
BASOS PCT: 0 %
Basophils Absolute: 0.1 10*3/uL (ref 0–0.1)
EOS ABS: 0.2 10*3/uL (ref 0–0.7)
EOS PCT: 1 %
HCT: 30.4 % — ABNORMAL LOW (ref 40.0–52.0)
HEMOGLOBIN: 10.1 g/dL — AB (ref 13.0–18.0)
LYMPHS ABS: 1.4 10*3/uL (ref 1.0–3.6)
Lymphocytes Relative: 11 %
MCH: 31.9 pg (ref 26.0–34.0)
MCHC: 33.3 g/dL (ref 32.0–36.0)
MCV: 95.8 fL (ref 80.0–100.0)
MONOS PCT: 7 %
Monocytes Absolute: 0.8 10*3/uL (ref 0.2–1.0)
NEUTROS PCT: 81 %
Neutro Abs: 10.3 10*3/uL — ABNORMAL HIGH (ref 1.4–6.5)
PLATELETS: 191 10*3/uL (ref 150–440)
RBC: 3.18 MIL/uL — ABNORMAL LOW (ref 4.40–5.90)
RDW: 14 % (ref 11.5–14.5)
WBC: 12.7 10*3/uL — ABNORMAL HIGH (ref 3.8–10.6)

## 2018-01-09 LAB — PROTIME-INR
INR: 1.03
PROTHROMBIN TIME: 13.4 s (ref 11.4–15.2)

## 2018-01-09 LAB — TYPE AND SCREEN
ABO/RH(D): A POS
Antibody Screen: NEGATIVE

## 2018-01-09 LAB — SURGICAL PCR SCREEN
MRSA, PCR: NEGATIVE
Staphylococcus aureus: NEGATIVE

## 2018-01-09 LAB — BASIC METABOLIC PANEL
Anion gap: 3 — ABNORMAL LOW (ref 5–15)
BUN: 28 mg/dL — ABNORMAL HIGH (ref 8–23)
CALCIUM: 9 mg/dL (ref 8.9–10.3)
CO2: 29 mmol/L (ref 22–32)
CREATININE: 1.63 mg/dL — AB (ref 0.61–1.24)
Chloride: 108 mmol/L (ref 98–111)
GFR calc Af Amer: 44 mL/min — ABNORMAL LOW (ref >60)
GFR, EST NON AFRICAN AMERICAN: 38 mL/min — AB (ref >60)
GLUCOSE: 95 mg/dL (ref 70–99)
Potassium: 5.1 mmol/L (ref 3.5–5.1)
Sodium: 140 mmol/L (ref 135–145)

## 2018-01-09 LAB — GLUCOSE, CAPILLARY: Glucose-Capillary: 144 mg/dL — ABNORMAL HIGH (ref 70–99)

## 2018-01-09 MED ORDER — MORPHINE SULFATE (PF) 4 MG/ML IV SOLN
4.0000 mg | Freq: Once | INTRAVENOUS | Status: AC
  Administered 2018-01-09: 4 mg via INTRAVENOUS
  Filled 2018-01-09: qty 1

## 2018-01-09 MED ORDER — FENTANYL CITRATE (PF) 100 MCG/2ML IJ SOLN
50.0000 ug | Freq: Once | INTRAMUSCULAR | Status: AC
  Administered 2018-01-09: 50 ug via INTRAVENOUS
  Filled 2018-01-09: qty 2

## 2018-01-09 MED ORDER — GLIPIZIDE ER 2.5 MG PO TB24
2.5000 mg | ORAL_TABLET | Freq: Every day | ORAL | Status: DC
  Administered 2018-01-11 – 2018-01-12 (×2): 2.5 mg via ORAL
  Filled 2018-01-09 (×3): qty 1

## 2018-01-09 MED ORDER — MOMETASONE FURO-FORMOTEROL FUM 100-5 MCG/ACT IN AERO
2.0000 | INHALATION_SPRAY | Freq: Two times a day (BID) | RESPIRATORY_TRACT | Status: DC
  Administered 2018-01-10 – 2018-01-12 (×4): 2 via RESPIRATORY_TRACT
  Filled 2018-01-09: qty 8.8

## 2018-01-09 MED ORDER — SODIUM CHLORIDE 0.9 % IV SOLN
Freq: Once | INTRAVENOUS | Status: DC
Start: 2018-01-09 — End: 2018-01-10

## 2018-01-09 MED ORDER — SODIUM CHLORIDE 0.9 % IV SOLN
INTRAVENOUS | Status: DC
  Administered 2018-01-09 – 2018-01-10 (×2): via INTRAVENOUS
  Administered 2018-01-10: 75 mL/h via INTRAVENOUS
  Administered 2018-01-11: 12:00:00 via INTRAVENOUS

## 2018-01-09 MED ORDER — ATORVASTATIN CALCIUM 20 MG PO TABS
40.0000 mg | ORAL_TABLET | Freq: Every day | ORAL | Status: DC
  Administered 2018-01-09 – 2018-01-12 (×4): 40 mg via ORAL
  Filled 2018-01-09 (×4): qty 2

## 2018-01-09 MED ORDER — INSULIN ASPART 100 UNIT/ML ~~LOC~~ SOLN
0.0000 [IU] | Freq: Three times a day (TID) | SUBCUTANEOUS | Status: DC
  Administered 2018-01-10: 2 [IU] via SUBCUTANEOUS
  Administered 2018-01-11: 3 [IU] via SUBCUTANEOUS
  Filled 2018-01-09 (×3): qty 1

## 2018-01-09 MED ORDER — LISINOPRIL 10 MG PO TABS
10.0000 mg | ORAL_TABLET | Freq: Every day | ORAL | Status: DC
  Administered 2018-01-12: 10 mg via ORAL
  Filled 2018-01-09: qty 1

## 2018-01-09 MED ORDER — CARVEDILOL 25 MG PO TABS
25.0000 mg | ORAL_TABLET | Freq: Two times a day (BID) | ORAL | Status: DC
  Administered 2018-01-10 – 2018-01-12 (×4): 25 mg via ORAL
  Filled 2018-01-09 (×4): qty 1

## 2018-01-09 MED ORDER — TIOTROPIUM BROMIDE MONOHYDRATE 18 MCG IN CAPS
18.0000 ug | ORAL_CAPSULE | Freq: Every day | RESPIRATORY_TRACT | Status: DC
  Administered 2018-01-11 – 2018-01-12 (×2): 18 ug via RESPIRATORY_TRACT
  Filled 2018-01-09: qty 5

## 2018-01-09 MED ORDER — ONDANSETRON HCL 4 MG/2ML IJ SOLN
4.0000 mg | INTRAMUSCULAR | Status: AC
  Administered 2018-01-09: 4 mg via INTRAVENOUS
  Filled 2018-01-09: qty 2

## 2018-01-09 MED ORDER — CEFAZOLIN SODIUM-DEXTROSE 2-4 GM/100ML-% IV SOLN
2.0000 g | INTRAVENOUS | Status: AC
  Administered 2018-01-10: 2 g via INTRAVENOUS
  Filled 2018-01-09 (×2): qty 100

## 2018-01-09 MED ORDER — MORPHINE SULFATE (PF) 4 MG/ML IV SOLN: 4.0000 mg | Freq: Once | INTRAVENOUS | Status: DC

## 2018-01-09 MED ORDER — OXYCODONE HCL 5 MG PO TABS
5.0000 mg | ORAL_TABLET | ORAL | Status: DC | PRN
  Administered 2018-01-09: 10 mg via ORAL
  Administered 2018-01-10: 5 mg via ORAL
  Filled 2018-01-09: qty 2
  Filled 2018-01-09: qty 1

## 2018-01-09 MED ORDER — ALBUTEROL SULFATE (2.5 MG/3ML) 0.083% IN NEBU: 3.0000 mL | INHALATION_SOLUTION | Freq: Four times a day (QID) | RESPIRATORY_TRACT | Status: DC | PRN

## 2018-01-09 MED ORDER — HYDROMORPHONE HCL 1 MG/ML IJ SOLN
0.5000 mg | INTRAMUSCULAR | Status: DC | PRN
  Administered 2018-01-10 (×2): 0.5 mg via INTRAVENOUS
  Filled 2018-01-09 (×2): qty 1

## 2018-01-09 NOTE — ED Notes (Signed)
Splint placed to L foot/ankle by Nicki Reaper EDT. Pts left foot remains neurovascularly intact.

## 2018-01-09 NOTE — ED Provider Notes (Addendum)
[facility] Emergency Department Provider Note  ____________________________________________   First MD Initiated Contact with Patient 01/09/18 1539     (approximate)  I have reviewed the triage vital signs and the nursing notes.   HISTORY  Chief Complaint Ankle Pain    HPI Micheal [name] is a 84 y.o. male with medical issues as listed below who presents by EMS after a fall.  He was on a stepladder changing the battery in a smoke detector when he fell from a couple of steps off the ground.  He had immediate onset of severe pain in his left ankle and is unable to bear weight.  He has no numbness or tingling but persistent sharp pain with some swelling and bruising.  Moving the foot and ankle makes it worse, nothing in particular makes it better other than rest.  He received no pain medicine in route by EMS.  He denies hitting his head, denies loss of consciousness, and denies chest pain, neck pain, shortness of breath, nausea, vomiting, and abdominal pain.  He reports that he takes a daily baby aspirin but no other blood thinners.  He says that he has COPD but does not use medicine regularly.  He cannot remember the name of his doctor and does not go to any providers regularly.  Past Medical History:  Diagnosis Date  . Balance problem    INTERMITTENT WITH NEGATIVE WORKUP  . COPD (chronic obstructive pulmonary disease) ([place])   . Coronary artery disease   . Diabetes mellitus without complication ([place])   . GERD (gastroesophageal reflux disease)   . Hypercholesteremia   . Hypertension   . Myocardial infarction ([place])    2005  . Pneumonia    RECENT 5/17  . Shortness of breath dyspnea   . Wheezing     Patient Active Problem List   Diagnosis Date Noted  . Respiratory failure with hypoxia and hypercapnia ([place]) 08/19/2017  . Community acquired pneumonia of left lower lobe of lung [facility])     Past Surgical History:  Procedure Laterality Date  . APPENDECTOMY     . CARDIAC CATHETERIZATION    . CATARACT EXTRACTION W/PHACO Right 10/04/2015   Procedure: CATARACT EXTRACTION PHACO AND INTRAOCULAR LENS PLACEMENT (IOC);  Surgeon: [name], MD;  Location: [facility] ORS;  Service: Ophthalmology;  Laterality: Right;  [place] 02:06 AP% 17.9 CDE 22.65 fluid pack lot # [id number]  . CATARACT EXTRACTION W/PHACO Left 12/13/2015   Procedure: CATARACT EXTRACTION PHACO AND INTRAOCULAR LENS PLACEMENT ([place]);  Surgeon: [name], MD;  Location: [facility] ORS;  Service: Ophthalmology;  Laterality: Left;  [place] 1.39 AP% 25.3 CDE 26.37 Fluid pack lot # [id number]  . COLONOSCOPY    . CORONARY ANGIOPLASTY     Stent placement    Prior to Admission medications   Medication Sig Start Date End Date Taking? Authorizing Provider  albuterol (PROVENTIL HFA;VENTOLIN HFA) 108 (90 Base) MCG/ACT inhaler Inhale 2 puffs into the lungs every 6 (six) hours as needed for wheezing or shortness of breath. 08/21/17  Yes [name], MD  aspirin EC 81 MG tablet Take 81 mg by mouth daily. 08/26/17  Yes [provider]  atorvastatin (LIPITOR) 40 MG tablet Take 1 tablet by mouth daily.    Yes [provider]  carvedilol (COREG) 25 MG tablet Take 25 mg by mouth 2 (two) times daily with a meal.    Yes [provider]  Fluticasone-Salmeterol (ADVAIR DISKUS) 100-50 MCG/DOSE AEPB Inhale 1 puff into the lungs 2 (  two) times daily. 08/21/17 08/21/18 Yes [name], MD  glipiZIDE (GLUCOTROL XL) 2.5 MG 24 hr tablet Take 2.5 mg by mouth daily with breakfast.   Yes [provider]  lisinopril (PRINIVIL,ZESTRIL) 10 MG tablet Take 10 mg by mouth daily.   Yes [provider]  metFORMIN (GLUCOPHAGE) 500 MG tablet Take 2 tablets (1000MG ) by mouth every morning and 1 tablet (500MG ) by mouth every evening   Yes [provider]  tiotropium (SPIRIVA HANDIHALER) 18 MCG inhalation capsule Place 1 capsule (18 mcg total) into inhaler and inhale daily. 08/21/17 08/21/18  Yes [name], MD  predniSONE (STERAPRED UNI-PAK 21 TAB) 10 MG (21) TBPK tablet Taper by 10 mg p.o. daily. Patient not taking: Reported on 01/09/2018 08/21/17   [name], MD    Allergies Patient has no known allergies.  No family history on file.  Social History Social History   Tobacco Use  . Smoking status: Former Research scientist (life sciences)  . Smokeless tobacco: Former Network engineer Use Topics  . Alcohol use: No    Comment: OCCAS  . Drug use: Not on file    Review of Systems Constitutional: No fever/chills Eyes: No visual changes. ENT: No sore throat. Cardiovascular: Denies chest pain. Respiratory: Denies shortness of breath. Gastrointestinal: No abdominal pain.  No nausea, no vomiting.  No diarrhea.  No constipation. Genitourinary: Negative for dysuria. Musculoskeletal: Acute onset left ankle pain as described above.  Negative for neck pain.  Negative for back pain. Integumentary: Negative for rash. Neurological: Negative for headaches, focal weakness or numbness.   ____________________________________________   PHYSICAL EXAM:  VITAL SIGNS: ED Triage Vitals  Enc Vitals Group     BP 01/09/18 1524 127/63     Pulse Rate 01/09/18 1524 66     Resp 01/09/18 1600 15     Temp --      Temp src --      SpO2 01/09/18 1524 96 %     Weight 01/09/18 1525 65.8 kg (145 lb)     Height 01/09/18 1525 1.803 m (5\' 11" )     Head Circumference --      Peak Flow --      Pain Score 01/09/18 1524 9     Pain Loc --      Pain Edu? --      Excl. in [place]? --     Constitutional: Alert and oriented. Well appearing and in no acute distress. Eyes: Conjunctivae are normal.  Head: Atraumatic. Nose: No congestion/rhinnorhea. Mouth/Throat: Mucous membranes are moist. Neck: No stridor.  No meningeal signs.  No cervical spine tenderness to palpation. Cardiovascular: Normal rate, regular rhythm. Good peripheral circulation. Grossly normal heart sounds. Respiratory: Normal respiratory  effort.  No retractions. Lungs CTAB. Gastrointestinal: Soft and nontender. No distention.  Musculoskeletal: No tenderness to palpation of C, T, nor L spines.  Pelvis stable.  Right lower extremity unremarkable.  Left ankle is swollen and ecchymotic, tender to palpation, consistent with ankle fracture.  Neurovascularly intact with normal capillary refill and good pulses. Neurologic:  Normal speech and language. No gross focal neurologic deficits are appreciated.  Skin:  Skin is warm, dry and intact. No rash noted. Psychiatric: Mood and affect are normal. Speech and behavior are normal.  ____________________________________________   LABS (all labs ordered are listed, but only abnormal results are displayed)  Labs Reviewed  CBC WITH DIFFERENTIAL/PLATELET - Abnormal; Notable for the following components:      Result Value   WBC 12.7 (*)  RBC 3.18 (*)    Hemoglobin 10.1 (*)    HCT 30.4 (*)    Neutro Abs 10.3 (*)    All other components within normal limits  BASIC METABOLIC PANEL - Abnormal; Notable for the following components:   BUN 28 (*)    Creatinine, Ser 1.63 (*)    GFR calc non Af Amer 38 (*)    GFR calc Af Amer 44 (*)    Anion gap 3 (*)    All other components within normal limits  PROTIME-INR   ____________________________________________  EKG  ED ECG REPORT I, Hinda Kehr, the attending physician, personally viewed and interpreted this ECG.  Date: 01/09/2018 EKG Time: 18:06 Rate: 57 Rhythm: sinus bradycardia QRS Axis: normal Intervals: short PR interval, incomplete RBBB ST/T Wave abnormalities: Non-specific ST segment / T-wave changes, but no evidence of acute ischemia. Narrative Interpretation: no evidence of acute ischemia   ____________________________________________  RADIOLOGY I, Hinda Kehr, personally viewed and evaluated these images (plain radiographs) as part of my medical decision making, as well as reviewing the written report by the  radiologist.  ED MD interpretation: Extensive displaced left ankle fracture as described below  Official radiology report(s): Dg Ankle Complete Left  Result Date: 01/09/2018 CLINICAL DATA:  Pain status post fall. EXAM: LEFT ANKLE COMPLETE - 3+ VIEW COMPARISON:  None. FINDINGS: There is significant distortion of the ankle mortise, with prominent medial widening related to displacement of the tibia relative to the underlying talus. Associated small avulsion fracture fragments underlying the medial malleolus, largest measuring 7 mm. Additional displaced fracture within the distal fibula, obliquely oriented, centered within the distal metaphysis. Associated narrowing of the lateral portion of the ankle mortise. Additional small avulsion fracture fragment within the lateral portion of the tibiotalar joint space. No fracture identified within the talus. Visualized portions of the calcaneus and midfoot appear intact and normally aligned. IMPRESSION: 1. Significant distortion of the ankle mortise, with prominent medial widening due to medial displacement of the distal tibia. Associated avulsion fracture fragments underlying the medial malleolus. 2. Displaced fracture of the distal fibula, obliquely oriented, with slight overlapping of the main fracture fragments. Additional small avulsion fracture fragment within the underlying lateral portion of the tibiotalar joint space. Electronically Signed   By: Franki Cabot M.D.   On: 01/09/2018 16:06   Dg Chest Portable 1 View  Result Date: 01/09/2018 CLINICAL DATA:  Preop chest EXAM: PORTABLE CHEST 1 VIEW COMPARISON:  08/19/2017 FINDINGS: The heart size and mediastinal contours are within normal limits. Nonaneurysmal atherosclerotic aorta is redemonstrated. Clearing of previously suspected left basilar pneumonia. No new pulmonary consolidation is visualized. The visualized skeletal structures are unremarkable. IMPRESSION: No active disease. Electronically Signed   By:  Ashley Royalty M.D.   On: 01/09/2018 18:10    ____________________________________________   PROCEDURES  Critical Care performed: No   Procedure(s) performed:   .Splint Application Date/Time: 0/93/2671 6:17 PM Performed by: Hinda Kehr, MD Authorized by: Hinda Kehr, MD   Consent:    Consent obtained:  Verbal   Consent given by:  Patient Pre-procedure details:    Sensation:  Normal Procedure details:    Laterality:  Left   Location:  Ankle   Ankle:  L ankle   Strapping: no     Splint type:  Short leg   Supplies:  Ortho-Glass Post-procedure details:    Pain:  Unchanged   Sensation:  Normal   Patient tolerance of procedure:  Tolerated well, no immediate complications     ____________________________________________  INITIAL IMPRESSION / ASSESSMENT AND PLAN / ED COURSE  As part of my medical decision making, I reviewed the following data within the [place] notes reviewed and incorporated, Labs reviewed , EKG interpreted , Radiograph reviewed , A consult was requested and obtained from this/these consultant(s) Orthopedics and Notes from prior ED visits    Differential diagnosis includes, but is not limited to, ankle fracture, ankle dislocation, vascular compromise.  The patient did not sustain any other injuries and has no other complaints except for the ankle pain.  Radiographs are pending.  No indication for CT scans at this time.  Administering morphine 4 mg IV for pain control and Zofran 4 mg IV.  Clinical Course as of Jan 09 1899  Thu Jan 09, 2018  1647 Extensively fractured ankle, paging Dr. Marry Guan to discuss.  DG Ankle Complete Left [CF]  1659 Spoke by phone with Dr. Clydell Hakim nurse, left a message with patient info, Dr. Marry Guan will call me back after reviewing radiographs.   [CF]  8938 I spoke by phone with Dr. Marry Guan who agrees that the patient needs operative fixation.  He will come see the patient and place admission orders  after seeing several other patients, likely in a couple of hours.  The patient is stable until that time.  His creatinine is 1.63 with this seems to be roughly his baseline.  CBC is unremarkable with a mild leukocytosis, likely reactive.  Protime-INR is within normal limits.  I have ordered an EKG and chest x-ray for preoperative evaluation.  We will place the patient in a splint, elevate the extremity, and apply ice to the area to decrease swelling.  I advised the patient and his family of all this and they are in strong agreement that he would not be safe to go home under the circumstances.   [CF]  1017 No indication of acute intrathoracic abnormality on chest x-ray.  EKG unremarkable.   [CF]  1859 Dr. Marry Guan came to the emergency department and evaluated the patient in person and will be putting in admission orders.  We discussed the case in person.   [CF]    Clinical Course User Index [CF] Hinda Kehr, MD    ____________________________________________  FINAL CLINICAL IMPRESSION(S) / ED DIAGNOSES  Final diagnoses:  Closed fracture of left ankle, initial encounter  Fall, initial encounter  Chronic kidney disease, unspecified CKD stage     MEDICATIONS GIVEN DURING THIS VISIT:  Medications  0.9 %  sodium chloride infusion (has no administration in time range)  morphine 4 MG/ML injection 4 mg (4 mg Intravenous Given 01/09/18 1547)  ondansetron (ZOFRAN) injection 4 mg (4 mg Intravenous Given 01/09/18 1547)  morphine 4 MG/ML injection 4 mg (4 mg Intravenous Given 01/09/18 1657)  fentaNYL (SUBLIMAZE) injection 50 mcg (50 mcg Intravenous Given 01/09/18 1746)     ED Discharge Orders    None       Note:  This document was prepared using Dragon voice recognition software and may include unintentional dictation errors.    Hinda Kehr, MD 01/09/18 Milon Dikes    Hinda Kehr, MD 01/09/18 1900

## 2018-01-09 NOTE — ED Triage Notes (Signed)
Pt presetns today via [facility] from home. Pt was changing batteries in smoke dectector and lost balance and fell. Pt denies syncope. Pt states pain is in the L Ankle pulse positive. Pt has soft splint on   94 RA 73 hr 101/70 18 lac  Fluids 170 cbg

## 2018-01-09 NOTE — ED Notes (Signed)
XR at bedside

## 2018-01-09 NOTE — ED Notes (Signed)
Pt notified that he will be going to inpt room soon. Pt co 10/10 pain to L ankle. Pt given medication prior to going to floor.

## 2018-01-09 NOTE — ED Notes (Signed)
Ortho at bedside.

## 2018-01-09 NOTE — H&P (Addendum)
ORTHOPAEDIC HISTORY & PHYSICAL  PATIENT NAME: Micheal [name] DOB: Jan 09, 1933  MRN: [id number]  Chief Complaint:  Left ankle pain  HPI: Micheal [name] is a 84 y.o. male who complains of left ankle pain.  He was on a stepladder changing a battery to a smoke detector when he fell to the ground, twisting his left ankle.  He had the immediate onset of left ankle pain and was unable to bear weight to the left lower extremity due to the ankle pain.  He denied any other injuries.  He denied any loss of consciousness.  Past Medical History:  Diagnosis Date  . Balance problem    INTERMITTENT WITH NEGATIVE WORKUP  . COPD (chronic obstructive pulmonary disease) ([place])   . Coronary artery disease   . Diabetes mellitus without complication ([name])   . GERD (gastroesophageal reflux disease)   . Hypercholesteremia   . Hypertension   . Myocardial infarction ([place])    2005  . Pneumonia    RECENT 5/17  . Shortness of breath dyspnea   . Wheezing    Past Surgical History:  Procedure Laterality Date  . APPENDECTOMY    . CARDIAC CATHETERIZATION    . CATARACT EXTRACTION W/PHACO Right 10/04/2015   Procedure: CATARACT EXTRACTION PHACO AND INTRAOCULAR LENS PLACEMENT (IOC);  Surgeon: [name], MD;  Location: [facility] ORS;  Service: Ophthalmology;  Laterality: Right;  [place] 02:06 AP% 17.9 CDE 22.65 fluid pack lot # [id number]  . CATARACT EXTRACTION W/PHACO Left 12/13/2015   Procedure: CATARACT EXTRACTION PHACO AND INTRAOCULAR LENS PLACEMENT ([place]);  Surgeon: [name], MD;  Location: [facility] ORS;  Service: Ophthalmology;  Laterality: Left;  [place] 1.39 AP% 25.3 CDE 26.37 Fluid pack lot # [id number]  . COLONOSCOPY    . CORONARY ANGIOPLASTY     Stent placement   Social History   Socioeconomic History  . Marital status: Married    Spouse name: Not on file  . Number of children: Not on file  . Years of education: Not on file  . Highest education level: Not on file  Occupational History  . Not on file   Social Needs  . Financial resource strain: Not on file  . Food insecurity:    Worry: Not on file    Inability: Not on file  . Transportation needs:    Medical: Not on file    Non-medical: Not on file  Tobacco Use  . Smoking status: Former Research scientist (life sciences)  . Smokeless tobacco: Former Network engineer and Sexual Activity  . Alcohol use: No    Comment: OCCAS  . Drug use: Not on file  . Sexual activity: Not on file  Lifestyle  . Physical activity:    Days per week: Not on file    Minutes per session: Not on file  . Stress: Not on file  Relationships  . Social connections:    Talks on phone: Not on file    Gets together: Not on file    Attends religious service: Not on file    Active member of club or organization: Not on file    Attends meetings of clubs or organizations: Not on file    Relationship status: Not on file  Other Topics Concern  . Not on file  Social History Narrative  . Not on file   History reviewed. No pertinent family history. No Known Allergies Prior to Admission medications   Medication Sig Start Date End Date Taking? Authorizing Provider  albuterol (PROVENTIL HFA;VENTOLIN HFA) 108 (90 Base)  MCG/ACT inhaler Inhale 2 puffs into the lungs every 6 (six) hours as needed for wheezing or shortness of breath. 08/21/17  Yes [name], MD  aspirin EC 81 MG tablet Take 81 mg by mouth daily. 08/26/17  Yes [provider]  atorvastatin (LIPITOR) 40 MG tablet Take 1 tablet by mouth daily.    Yes [provider]  carvedilol (COREG) 25 MG tablet Take 25 mg by mouth 2 (two) times daily with a meal.    Yes [provider]  Fluticasone-Salmeterol (ADVAIR DISKUS) 100-50 MCG/DOSE AEPB Inhale 1 puff into the lungs 2 (two) times daily. 08/21/17 08/21/18 Yes [name], MD  glipiZIDE (GLUCOTROL XL) 2.5 MG 24 hr tablet Take 2.5 mg by mouth daily with breakfast.   Yes [provider]  lisinopril (PRINIVIL,ZESTRIL) 10 MG tablet Take 10 mg by  mouth daily.   Yes [provider]  metFORMIN (GLUCOPHAGE) 500 MG tablet Take 2 tablets (1000MG ) by mouth every morning and 1 tablet (500MG ) by mouth every evening   Yes [provider]  tiotropium (SPIRIVA HANDIHALER) 18 MCG inhalation capsule Place 1 capsule (18 mcg total) into inhaler and inhale daily. 08/21/17 08/21/18 Yes [name], MD  predniSONE (STERAPRED UNI-PAK 21 TAB) 10 MG (21) TBPK tablet Taper by 10 mg p.o. daily. Patient not taking: Reported on 01/09/2018 08/21/17   [name], MD   Dg Ankle Complete Left  Result Date: 01/09/2018 CLINICAL DATA:  Pain status post fall. EXAM: LEFT ANKLE COMPLETE - 3+ VIEW COMPARISON:  None. FINDINGS: There is significant distortion of the ankle mortise, with prominent medial widening related to displacement of the tibia relative to the underlying talus. Associated small avulsion fracture fragments underlying the medial malleolus, largest measuring 7 mm. Additional displaced fracture within the distal fibula, obliquely oriented, centered within the distal metaphysis. Associated narrowing of the lateral portion of the ankle mortise. Additional small avulsion fracture fragment within the lateral portion of the tibiotalar joint space. No fracture identified within the talus. Visualized portions of the calcaneus and midfoot appear intact and normally aligned. IMPRESSION: 1. Significant distortion of the ankle mortise, with prominent medial widening due to medial displacement of the distal tibia. Associated avulsion fracture fragments underlying the medial malleolus. 2. Displaced fracture of the distal fibula, obliquely oriented, with slight overlapping of the main fracture fragments. Additional small avulsion fracture fragment within the underlying lateral portion of the tibiotalar joint space. Electronically Signed   By: Franki Cabot M.D.   On: 01/09/2018 16:06   Dg Chest Portable 1 View  Result Date: 01/09/2018 CLINICAL DATA:   Preop chest EXAM: PORTABLE CHEST 1 VIEW COMPARISON:  08/19/2017 FINDINGS: The heart size and mediastinal contours are within normal limits. Nonaneurysmal atherosclerotic aorta is redemonstrated. Clearing of previously suspected left basilar pneumonia. No new pulmonary consolidation is visualized. The visualized skeletal structures are unremarkable. IMPRESSION: No active disease. Electronically Signed   By: Ashley Royalty M.D.   On: 01/09/2018 18:10    Positive ROS: All other systems have been reviewed and were otherwise negative with the exception of those mentioned in the HPI and as above.  Physical Exam: General: Well developed, well nourished male seen in no acute distress. HEENT: Atraumatic and normocephalic. Sclera are clear. Extraocular motion is intact. Oropharynx is clear with moist mucosa. Neck: Supple, nontender, good range of motion. No JVD or carotid bruits. Lungs: Clear to auscultation bilaterally. Cardiovascular: Regular rate and rhythm with normal S1 and S2. No murmurs. No gallops or rubs. Pedal pulses  are palpable bilaterally. Homans test is negative bilaterally. No significant pretibial or ankle edema.  Good capillary refill. Abdomen: Soft, nontender, and nondistended. Bowel sounds are present. Skin: No lesions in the area of chief complaint Neurologic: Awake, alert, and oriented. Sensory function is grossly intact. Motor strength is felt to be 5 over 5 bilaterally. No clonus or tremor. Good motor coordination. Lymphatic: No axillary or cervical lymphadenopathy  MUSCULOSKELETAL: Semination of the left lower extremity shows a posterior splint to be in place.  There is tenderness to palpation along the lateral malleolus.  Mild swelling is appreciated.  Assessment: Left lateral and posterior malleolar ankle fracture  Plan: The findings were discussed in detail with the patient.  Recommendation was made for open reduction and internal fixation of the left lateral malleolar fracture.   The usual perioperative course was discussed. The risks and benefits of surgical intervention were reviewed. The patient expressed understanding of the risks and benefits and agreed with plans for surgical intervention.   Aishwarya Shiplett P. Holley Bouche M.D.

## 2018-01-10 ENCOUNTER — Inpatient Hospital Stay: Payer: Medicare Other | Admitting: Anesthesiology

## 2018-01-10 ENCOUNTER — Other Ambulatory Visit: Payer: Self-pay

## 2018-01-10 ENCOUNTER — Encounter
Admission: EM | Disposition: A | Discharge status: Home Health | Payer: Self-pay | Source: Home / Self Care | Attending: Orthopedic Surgery

## 2018-01-10 HISTORY — PX: ORIF ANKLE FRACTURE: SHX5408

## 2018-01-10 LAB — GLUCOSE, CAPILLARY
GLUCOSE-CAPILLARY: 141 mg/dL — AB (ref 70–99)
GLUCOSE-CAPILLARY: 88 mg/dL (ref 70–99)
GLUCOSE-CAPILLARY: 89 mg/dL (ref 70–99)
Glucose-Capillary: 78 mg/dL (ref 70–99)

## 2018-01-10 LAB — HEMOGLOBIN A1C
HEMOGLOBIN A1C: 6.8 % — AB (ref 4.8–5.6)
Mean Plasma Glucose: 148.46 mg/dL

## 2018-01-10 SURGERY — OPEN REDUCTION INTERNAL FIXATION (ORIF) ANKLE FRACTURE
Anesthesia: General | Site: Ankle | Laterality: Left | Wound class: Clean

## 2018-01-10 MED ORDER — OXYCODONE HCL 5 MG PO TABS
5.0000 mg | ORAL_TABLET | ORAL | Status: DC | PRN
  Administered 2018-01-10 – 2018-01-12 (×4): 5 mg via ORAL
  Filled 2018-01-10 (×6): qty 1

## 2018-01-10 MED ORDER — METOCLOPRAMIDE HCL 10 MG PO TABS: 5.0000 mg | ORAL_TABLET | Freq: Three times a day (TID) | ORAL | Status: DC | PRN

## 2018-01-10 MED ORDER — METOCLOPRAMIDE HCL 5 MG/ML IJ SOLN: 5.0000 mg | Freq: Three times a day (TID) | INTRAMUSCULAR | Status: DC | PRN

## 2018-01-10 MED ORDER — FLEET ENEMA 7-19 GM/118ML RE ENEM: 1.0000 | ENEMA | Freq: Once | RECTAL | Status: DC | PRN

## 2018-01-10 MED ORDER — HYDROMORPHONE HCL 1 MG/ML IJ SOLN
INTRAMUSCULAR | Status: AC
  Filled 2018-01-10: qty 1

## 2018-01-10 MED ORDER — LIDOCAINE HCL (CARDIAC) PF 100 MG/5ML IV SOSY
PREFILLED_SYRINGE | INTRAVENOUS | Status: DC | PRN
  Administered 2018-01-10: 60 mg via INTRAVENOUS

## 2018-01-10 MED ORDER — PROPOFOL 10 MG/ML IV BOLUS
INTRAVENOUS | Status: AC
  Filled 2018-01-10: qty 20

## 2018-01-10 MED ORDER — SODIUM CHLORIDE FLUSH 0.9 % IV SOLN
INTRAVENOUS | Status: AC
  Filled 2018-01-10: qty 10

## 2018-01-10 MED ORDER — ONDANSETRON HCL 4 MG/2ML IJ SOLN
INTRAMUSCULAR | Status: AC
  Filled 2018-01-10: qty 2

## 2018-01-10 MED ORDER — NEOMYCIN-POLYMYXIN B GU 40-200000 IR SOLN
Status: DC | PRN
  Administered 2018-01-10: 2 mL

## 2018-01-10 MED ORDER — ONDANSETRON HCL 4 MG/2ML IJ SOLN: 4.0000 mg | Freq: Four times a day (QID) | INTRAMUSCULAR | Status: DC | PRN

## 2018-01-10 MED ORDER — ENOXAPARIN SODIUM 40 MG/0.4ML ~~LOC~~ SOLN
40.0000 mg | SUBCUTANEOUS | Status: DC
  Administered 2018-01-11 – 2018-01-12 (×2): 40 mg via SUBCUTANEOUS
  Filled 2018-01-10 (×2): qty 0.4

## 2018-01-10 MED ORDER — PANTOPRAZOLE SODIUM 40 MG PO TBEC
40.0000 mg | DELAYED_RELEASE_TABLET | Freq: Two times a day (BID) | ORAL | Status: DC
  Administered 2018-01-10 – 2018-01-12 (×4): 40 mg via ORAL
  Filled 2018-01-10 (×4): qty 1

## 2018-01-10 MED ORDER — METOCLOPRAMIDE HCL 10 MG PO TABS
10.0000 mg | ORAL_TABLET | Freq: Three times a day (TID) | ORAL | Status: DC
  Administered 2018-01-10 – 2018-01-12 (×6): 10 mg via ORAL
  Filled 2018-01-10 (×6): qty 1

## 2018-01-10 MED ORDER — BUPIVACAINE HCL 0.25 % IJ SOLN
INTRAMUSCULAR | Status: DC | PRN
  Administered 2018-01-10: 10 mL

## 2018-01-10 MED ORDER — FENTANYL CITRATE (PF) 100 MCG/2ML IJ SOLN
INTRAMUSCULAR | Status: AC
  Filled 2018-01-10: qty 2

## 2018-01-10 MED ORDER — ONDANSETRON HCL 4 MG/2ML IJ SOLN
INTRAMUSCULAR | Status: DC | PRN
  Administered 2018-01-10: 4 mg via INTRAVENOUS

## 2018-01-10 MED ORDER — TRAMADOL HCL 50 MG PO TABS
50.0000 mg | ORAL_TABLET | ORAL | Status: DC | PRN
  Administered 2018-01-12: 100 mg via ORAL
  Filled 2018-01-10: qty 2

## 2018-01-10 MED ORDER — PROPOFOL 10 MG/ML IV BOLUS
INTRAVENOUS | Status: DC | PRN
  Administered 2018-01-10: 50 mg via INTRAVENOUS
  Administered 2018-01-10: 120 mg via INTRAVENOUS

## 2018-01-10 MED ORDER — MENTHOL 3 MG MT LOZG
1.0000 | LOZENGE | OROMUCOSAL | Status: DC | PRN
  Filled 2018-01-10: qty 9

## 2018-01-10 MED ORDER — PHENYLEPHRINE HCL 10 MG/ML IJ SOLN
INTRAMUSCULAR | Status: DC | PRN
  Administered 2018-01-10 (×6): 100 ug via INTRAVENOUS

## 2018-01-10 MED ORDER — LACTATED RINGERS IV SOLN
INTRAVENOUS | Status: DC | PRN
  Administered 2018-01-10: 17:00:00 via INTRAVENOUS

## 2018-01-10 MED ORDER — NEOMYCIN-POLYMYXIN B GU 40-200000 IR SOLN
Status: AC
  Filled 2018-01-10: qty 2

## 2018-01-10 MED ORDER — FENTANYL CITRATE (PF) 100 MCG/2ML IJ SOLN: 25.0000 ug | INTRAMUSCULAR | Status: DC | PRN

## 2018-01-10 MED ORDER — ONDANSETRON HCL 4 MG/2ML IJ SOLN: 4.0000 mg | Freq: Once | INTRAMUSCULAR | Status: DC | PRN

## 2018-01-10 MED ORDER — ACETAMINOPHEN 10 MG/ML IV SOLN
1000.0000 mg | Freq: Four times a day (QID) | INTRAVENOUS | Status: AC
  Administered 2018-01-11 (×4): 1000 mg via INTRAVENOUS
  Filled 2018-01-10 (×4): qty 100

## 2018-01-10 MED ORDER — OXYCODONE HCL 5 MG PO TABS
10.0000 mg | ORAL_TABLET | ORAL | Status: DC | PRN
  Administered 2018-01-11: 10 mg via ORAL
  Filled 2018-01-10: qty 2

## 2018-01-10 MED ORDER — HYDROMORPHONE HCL 1 MG/ML IJ SOLN
0.2500 mg | INTRAMUSCULAR | Status: AC | PRN
  Administered 2018-01-10 (×2): 0.25 mg via INTRAVENOUS

## 2018-01-10 MED ORDER — FENTANYL CITRATE (PF) 100 MCG/2ML IJ SOLN
INTRAMUSCULAR | Status: DC | PRN
  Administered 2018-01-10: 25 ug via INTRAVENOUS
  Administered 2018-01-10: 40 ug via INTRAVENOUS
  Administered 2018-01-10: 10 ug via INTRAVENOUS

## 2018-01-10 MED ORDER — SENNOSIDES-DOCUSATE SODIUM 8.6-50 MG PO TABS
1.0000 | ORAL_TABLET | Freq: Two times a day (BID) | ORAL | Status: DC
  Administered 2018-01-10 – 2018-01-12 (×4): 1 via ORAL
  Filled 2018-01-10 (×4): qty 1

## 2018-01-10 MED ORDER — ACETAMINOPHEN 325 MG PO TABS: 325.0000 mg | ORAL_TABLET | Freq: Four times a day (QID) | ORAL | Status: DC | PRN

## 2018-01-10 MED ORDER — EPHEDRINE SULFATE 50 MG/ML IJ SOLN
INTRAMUSCULAR | Status: DC | PRN
  Administered 2018-01-10: 15 mg via INTRAVENOUS
  Administered 2018-01-10: 10 mg via INTRAVENOUS
  Administered 2018-01-10: 15 mg via INTRAVENOUS
  Administered 2018-01-10: 10 mg via INTRAVENOUS

## 2018-01-10 MED ORDER — ONDANSETRON HCL 4 MG PO TABS: 4.0000 mg | ORAL_TABLET | Freq: Four times a day (QID) | ORAL | Status: DC | PRN

## 2018-01-10 MED ORDER — LIDOCAINE HCL URETHRAL/MUCOSAL 2 % EX GEL
CUTANEOUS | Status: AC
  Filled 2018-01-10: qty 5

## 2018-01-10 MED ORDER — HYDROMORPHONE HCL 1 MG/ML IJ SOLN: 0.5000 mg | INTRAMUSCULAR | Status: DC | PRN

## 2018-01-10 MED ORDER — ACETAMINOPHEN 10 MG/ML IV SOLN
INTRAVENOUS | Status: DC | PRN
  Administered 2018-01-10: 1000 mg via INTRAVENOUS

## 2018-01-10 MED ORDER — PHENOL 1.4 % MT LIQD
1.0000 | OROMUCOSAL | Status: DC | PRN
  Filled 2018-01-10: qty 177

## 2018-01-10 MED ORDER — CEFAZOLIN SODIUM-DEXTROSE 2-4 GM/100ML-% IV SOLN
2.0000 g | Freq: Four times a day (QID) | INTRAVENOUS | Status: AC
  Administered 2018-01-10 – 2018-01-11 (×4): 2 g via INTRAVENOUS
  Filled 2018-01-10 (×4): qty 100

## 2018-01-10 MED ORDER — BISACODYL 10 MG RE SUPP: 10.0000 mg | Freq: Every day | RECTAL | Status: DC | PRN

## 2018-01-10 MED ORDER — MAGNESIUM HYDROXIDE 400 MG/5ML PO SUSP: 30.0000 mL | Freq: Every day | ORAL | Status: DC | PRN

## 2018-01-10 MED ORDER — BUPIVACAINE-EPINEPHRINE (PF) 0.25% -1:200000 IJ SOLN
INTRAMUSCULAR | Status: AC
  Filled 2018-01-10: qty 30

## 2018-01-10 SURGICAL SUPPLY — 44 items
BANDAGE ACE 4X5 VEL STRL LF (GAUZE/BANDAGES/DRESSINGS) ×3 IMPLANT
BIT DRILL 2.5X110 QC LCP DISP (BIT) ×3 IMPLANT
BNDG COHESIVE 4X5 TAN STRL (GAUZE/BANDAGES/DRESSINGS) ×3 IMPLANT
BNDG ESMARK 6X12 TAN STRL LF (GAUZE/BANDAGES/DRESSINGS) ×3 IMPLANT
COOLER POLAR GLACIER W/PUMP (MISCELLANEOUS) ×3 IMPLANT
CUFF TOURN 24 STER (MISCELLANEOUS) IMPLANT
CUFF TOURN 30 STER DUAL PORT (MISCELLANEOUS) IMPLANT
DRAPE FLUOR MINI C-ARM 54X84 (DRAPES) ×3 IMPLANT
DRSG DERMACEA 8X12 NADH (GAUZE/BANDAGES/DRESSINGS) ×3 IMPLANT
DURAPREP 26ML APPLICATOR (WOUND CARE) ×3 IMPLANT
ELECT CAUTERY BLADE 6.4 (BLADE) ×3 IMPLANT
ELECT REM PT RETURN 9FT ADLT (ELECTROSURGICAL) ×3
ELECTRODE REM PT RTRN 9FT ADLT (ELECTROSURGICAL) ×1 IMPLANT
GAUZE SPONGE 4X4 12PLY STRL (GAUZE/BANDAGES/DRESSINGS) ×3 IMPLANT
GLOVE BIOGEL M STRL SZ7.5 (GLOVE) ×3 IMPLANT
GLOVE INDICATOR 8.0 STRL GRN (GLOVE) ×3 IMPLANT
GOWN STRL REUS W/ TWL LRG LVL3 (GOWN DISPOSABLE) ×2 IMPLANT
GOWN STRL REUS W/TWL LRG LVL3 (GOWN DISPOSABLE) ×4
KIT TURNOVER KIT A (KITS) ×3 IMPLANT
LABEL OR SOLS (LABEL) ×3 IMPLANT
PACK EXTREMITY ARMC (MISCELLANEOUS) ×3 IMPLANT
PAD ABD DERMACEA PRESS 5X9 (GAUZE/BANDAGES/DRESSINGS) ×9 IMPLANT
PAD CAST CTTN 4X4 STRL (SOFTGOODS) ×3 IMPLANT
PAD PREP 24X41 OB/GYN DISP (PERSONAL CARE ITEMS) ×3 IMPLANT
PAD WRAPON POLAR ANKLE (MISCELLANEOUS) ×1 IMPLANT
PADDING CAST COTTON 4X4 STRL (SOFTGOODS) ×6
PLATE LCP 3.5 1/3 TUB 7HX81 (Plate) ×3 IMPLANT
SCREW CANC FT ST SFS 4X16 (Screw) ×3 IMPLANT
SCREW CORTEX 3.5 10MM (Screw) ×4 IMPLANT
SCREW CORTEX 3.5 12MM (Screw) ×2 IMPLANT
SCREW CORTEX 3.5 16MM (Screw) ×2 IMPLANT
SCREW CORTEX 3.5 18MM (Screw) ×2 IMPLANT
SCREW LOCK CORT ST 3.5X10 (Screw) ×2 IMPLANT
SCREW LOCK CORT ST 3.5X12 (Screw) ×1 IMPLANT
SCREW LOCK CORT ST 3.5X16 (Screw) ×1 IMPLANT
SCREW LOCK CORT ST 3.5X18 (Screw) ×1 IMPLANT
SPLINT CAST 1 STEP 5X30 WHT (MISCELLANEOUS) ×3 IMPLANT
SPONGE LAP 18X18 RF (DISPOSABLE) ×3 IMPLANT
STAPLER SKIN PROX 35W (STAPLE) ×3 IMPLANT
STOCKINETTE STRL 6IN 960660 (GAUZE/BANDAGES/DRESSINGS) ×3 IMPLANT
SUT VIC AB 0 CT1 36 (SUTURE) ×6 IMPLANT
SUT VIC AB 2-0 SH 27 (SUTURE) ×2
SUT VIC AB 2-0 SH 27XBRD (SUTURE) ×1 IMPLANT
WRAPON POLAR PAD ANKLE (MISCELLANEOUS) ×3

## 2018-01-10 NOTE — Transfer of Care (Signed)
Immediate Anesthesia Transfer of Care Note  Patient: Micheal [name]  Procedure(s) Performed: OPEN REDUCTION INTERNAL FIXATION (ORIF) ANKLE FRACTURE (Left Ankle)  Patient Location: PACU  Anesthesia Type:General  Level of Consciousness: sedated  Airway & Oxygen Therapy: Patient Spontanous Breathing and Patient connected to nasal cannula oxygen  Post-op Assessment: Report given to RN and Post -op Vital signs reviewed and stable  Post vital signs: Reviewed and stable  Last Vitals:  Vitals Value Taken Time  BP 106/36 01/10/2018  7:20 PM  Temp    Pulse 74 01/10/2018  7:20 PM  Resp 15 01/10/2018  7:20 PM  SpO2 99 % 01/10/2018  7:20 PM  Vitals shown include unvalidated device data.  Last Pain:  Vitals:   01/10/18 1701  TempSrc:   PainSc: 10-Worst pain ever      Patients Stated Pain Goal: 2 (87/57/97 2820)  Complications: No apparent anesthesia complications

## 2018-01-10 NOTE — Anesthesia Post-op Follow-up Note (Signed)
Anesthesia QCDR form completed.        

## 2018-01-10 NOTE — NC FL2 (Signed)
[place] LEVEL OF CARE SCREENING TOOL     IDENTIFICATION  Patient Name: Micheal [name] Birthdate: [birth year] Sex: male Admission Date (Current Location): 01/09/2018  [place] and [place] Number:  Engineering geologist and Address:  [facility], [address], [place], [place] [address]      Provider Number: [id number]  Attending Physician Name and Address:  Dereck Leep, MD  Relative Name and Phone Number:       Current Level of Care: Hospital Recommended Level of Care: [place] Prior Approval Number:    Date Approved/Denied:   PASRR Number: ([id number])  Discharge Plan: SNF    Current Diagnoses: Patient Active Problem List   Diagnosis Date Noted  . Bimalleolar ankle fracture, left, closed, initial encounter 01/09/2018  . Respiratory failure with hypoxia and hypercapnia ([place]) 08/19/2017  . Community acquired pneumonia of left lower lobe of lung ([place])   . Controlled diabetes mellitus type 2 with complications ([place]) 86/76/7209  . Gastroesophageal reflux disease without esophagitis 05/26/2015  . Alcohol abuse 01/01/2013  . COPD (chronic obstructive pulmonary disease) ([place]) 10/22/2012  . HTN (hypertension) 10/22/2012    Orientation RESPIRATION BLADDER Height & Weight     Self, Time, Situation, Place  Normal Continent Weight: 145 lb (65.8 kg) Height:  5\' 11"  (180.3 cm)  BEHAVIORAL SYMPTOMS/MOOD NEUROLOGICAL BOWEL NUTRITION STATUS      Continent Diet(NPO for surgery to be advanced. )  AMBULATORY STATUS COMMUNICATION OF NEEDS Skin   Extensive Assist Verbally Surgical wounds                       Personal Care Assistance Level of Assistance  Bathing, Feeding, Dressing Bathing Assistance: Limited assistance Feeding assistance: Independent Dressing Assistance: Limited assistance     Functional Limitations Info  Sight, Hearing, Speech Sight Info: Adequate Hearing Info: Adequate Speech Info: Adequate     SPECIAL CARE FACTORS FREQUENCY  PT (By licensed PT), OT (By licensed OT)     PT Frequency: (5) OT Frequency: (5)            Contractures      Additional Factors Info  Code Status, Allergies Code Status Info: (Full Code. ) Allergies Info: (No Known Allergies. )           Current Medications (01/10/2018):  This is the current hospital active medication list Current Facility-Administered Medications  Medication Dose Route Frequency Provider Last Rate Last Dose  . 0.9 %  sodium chloride infusion   Intravenous Once Hinda Kehr, MD      . 0.9 %  sodium chloride infusion   Intravenous Continuous Hooten, Laurice Record, MD 75 mL/hr at 01/09/18 2134    . albuterol (PROVENTIL) (2.5 MG/3ML) 0.083% nebulizer solution 3 mL  3 mL Inhalation Q6H PRN Hooten, Laurice Record, MD      . atorvastatin (LIPITOR) tablet 40 mg  40 mg Oral Daily Hooten, Laurice Record, MD   40 mg at 01/09/18 2231  . carvedilol (COREG) tablet 25 mg  25 mg Oral BID WC Hooten, Laurice Record, MD   25 mg at 01/10/18 0819  . ceFAZolin (ANCEF) IVPB 2g/100 mL premix  2 g Intravenous To OR Hooten, Laurice Record, MD      . glipiZIDE (GLUCOTROL XL) 24 hr tablet 2.5 mg  2.5 mg Oral Q breakfast Hooten, Laurice Record, MD      . HYDROmorphone (DILAUDID) injection 0.5 mg  0.5 mg Intravenous Q2H PRN Hooten, Laurice Record,  MD   0.5 mg at 01/10/18 0820  . insulin aspart (novoLOG) injection 0-15 Units  0-15 Units Subcutaneous TID WC Hooten, Laurice Record, MD   2 Units at 01/10/18 [phone]  . lisinopril (PRINIVIL,ZESTRIL) tablet 10 mg  10 mg Oral Daily Hooten, Laurice Record, MD      . mometasone-formoterol (DULERA) 100-5 MCG/ACT inhaler 2 puff  2 puff Inhalation BID Hooten, Laurice Record, MD   2 puff at 01/10/18 0820  . oxyCODONE (Oxy IR/ROXICODONE) immediate release tablet 5-10 mg  5-10 mg Oral Q4H PRN Hooten, Laurice Record, MD   5 mg at 01/10/18 0240  . tiotropium (SPIRIVA) inhalation capsule 18 mcg  18 mcg Inhalation Daily Hooten, Laurice Record, MD         Discharge Medications: Please see discharge  summary for a list of discharge medications.  Relevant Imaging Results:  Relevant Lab Results:   Additional Information (SSN: [id number] )  Emmanuel Gruenhagen, Veronia Beets, LCSW

## 2018-01-10 NOTE — Anesthesia Preprocedure Evaluation (Signed)
Anesthesia Evaluation  Patient identified by MRN, date of birth, ID band Patient awake    Reviewed: Allergy & Precautions, H&P , NPO status , Patient's Chart, lab work & pertinent test results, reviewed documented beta blocker date and time   Airway Mallampati: II  TM Distance: >3 FB Neck ROM: full    Dental  (+) Teeth Intact   Pulmonary neg pulmonary ROS, shortness of breath and with exertion, pneumonia, COPD,  COPD inhaler, former smoker,    Pulmonary exam normal        Cardiovascular Exercise Tolerance: Good hypertension, + CAD and + Past MI  negative cardio ROS Normal cardiovascular exam Rate:Normal     Neuro/Psych PSYCHIATRIC DISORDERS negative neurological ROS  negative psych ROS   GI/Hepatic negative GI ROS, Neg liver ROS, GERD  Medicated,  Endo/Other  negative endocrine ROSdiabetes, Well Controlled, Type 2, Oral Hypoglycemic Agents  Renal/GU negative Renal ROS  negative genitourinary   Musculoskeletal   Abdominal   Peds  Hematology negative hematology ROS (+)   Anesthesia Other Findings   Reproductive/Obstetrics negative OB ROS                             Anesthesia Physical Anesthesia Plan  ASA: III and emergent  Anesthesia Plan: Spinal and General   Post-op Pain Management:    Induction:   PONV Risk Score and Plan:   Airway Management Planned:   Additional Equipment:   Intra-op Plan:   Post-operative Plan:   Informed Consent: I have reviewed the patients History and Physical, chart, labs and discussed the procedure including the risks, benefits and alternatives for the proposed anesthesia with the patient or authorized representative who has indicated his/her understanding and acceptance.     Plan Discussed with: CRNA  Anesthesia Plan Comments:         Anesthesia Quick Evaluation

## 2018-01-10 NOTE — Clinical Social Work Placement (Signed)
   CLINICAL SOCIAL WORK PLACEMENT  NOTE  Date:  01/10/2018  Patient Details  Name: Micheal [name] MRN: [id number] Date of Birth: 1932-12-30  Clinical Social Work is seeking post-discharge placement for this patient at the [place] level of care (*CSW will initial, date and re-position this form in  chart as items are completed):  Yes   Patient/family provided with [place] Work Department's list of facilities offering this level of care within the geographic area requested by the patient (or if unable, by the patient's family).  Yes   Patient/family informed of their freedom to choose among providers that offer the needed level of care, that participate in Medicare, Medicaid or managed care program needed by the patient, have an available bed and are willing to accept the patient.  Yes   Patient/family informed of [place]'s ownership interest in [facility] and [facility], as well as of the fact that they are under no obligation to receive care at these facilities.  PASRR submitted to EDS on 01/10/18     PASRR number received on 01/10/18     Existing PASRR number confirmed on       FL2 transmitted to all facilities in geographic area requested by pt/family on 01/10/18     FL2 transmitted to all facilities within larger geographic area on       Patient informed that his/her managed care company has contracts with or will negotiate with certain facilities, including the following:            Patient/family informed of bed offers received.  Patient chooses bed at       Physician recommends and patient chooses bed at      Patient to be transferred to   on  .  Patient to be transferred to facility by       Patient family notified on   of transfer.  Name of family member notified:        PHYSICIAN       Additional Comment:    _______________________________________________ Eliezer Khawaja, Veronia Beets, LCSW 01/10/2018, 10:05 AM

## 2018-01-10 NOTE — Clinical Social Work Note (Signed)
Clinical Social Work Assessment  Patient Details  Name: Micheal [name] MRN: [id number] Date of Birth: 06-22-1932  Date of referral:  01/10/18               Reason for consult:  Facility Placement                Permission sought to share information with:  Chartered certified accountant granted to share information::  Yes, Verbal Permission Granted  Name::      Micheal [name]::   Micheal [name]   Relationship::     Contact Information:     Housing/Transportation Living arrangements for the past 2 months:  [place] of Information:  Patient Patient Interpreter Needed:  None Criminal Activity/Legal Involvement Pertinent to Current Situation/Hospitalization:  No - Comment as needed Significant Relationships:  Spouse Lives with:  Spouse Do you feel safe going back to the place where you live?  Yes Need for family participation in patient care:  Yes (Comment)  Care giving concerns:  Patient lives in [place] with his wife Micheal [name].    Social Worker assessment / plan:  Holiday representative (CSW) received SNF consult. Patient has an ankle fracture and surgery and PT are pending. CSW met with patient today alone at bedside prior to surgery. Patient was alert and oriented X4 and was laying in the bed. Patient reported that his foot hurt. CSW provided emotional support. CSW introduced self and explained role of CSW department. Per patient he lives with his wife in [place]. CSW explained that PT will evaluate patient after surgery and make a recommendation of SNF or home health. Patient is agreeable to SNF search in [facility] if needed. CSW explained that [facility] will have to approve SNF. Patient verbalized his understanding. Per patient he has never been to short term rehab before but believes he may need it after surgery. FL2 complete and faxed out. CSW will continue to follow and assist as needed.   Employment status:  Retired Designer, industrial/product PT Recommendations:  Not assessed at this time Information / Referral to community resources:  Skilled Nursing Facility(SNF vs [place] )  Patient/Family's Response to care:  Patient is agreeable to SNF if needed.   Patient/Family's Understanding of and Emotional Response to Diagnosis, Current Treatment, and Prognosis:  Patient was very pleasant and thanked CSW for assistance.   Emotional Assessment Appearance:  Appears stated age Attitude/Demeanor/Rapport:    Affect (typically observed):  Accepting, Adaptable, Pleasant Orientation:  Oriented to Self, Oriented to Place, Oriented to  Time, Oriented to Situation Alcohol / Substance use:  Not Applicable Psych involvement (Current and /or in the community):  No (Comment)  Discharge Needs  Concerns to be addressed:  Discharge Planning Concerns Readmission within the last 30 days:  No Current discharge risk:  Dependent with Mobility Barriers to Discharge:  Continued Medical Work up   [employer], Veronia Beets, LCSW 01/10/2018, 10:06 AM

## 2018-01-10 NOTE — Anesthesia Procedure Notes (Signed)
Procedure Name: LMA Insertion Date/Time: 01/10/2018 5:33 PM Performed by: Sherrine Maples, CRNA Pre-anesthesia Checklist: Patient identified, Patient being monitored, Timeout performed, Emergency Drugs available and Suction available Patient Re-evaluated:Patient Re-evaluated prior to induction Oxygen Delivery Method: Circle system utilized Preoxygenation: Pre-oxygenation with 100% oxygen Induction Type: IV induction Ventilation: Mask ventilation without difficulty LMA: LMA inserted LMA Size: 4.0 Tube type: Oral Number of attempts: 1 Placement Confirmation: positive ETCO2 and breath sounds checked- equal and bilateral Dental Injury: Teeth and Oropharynx as per pre-operative assessment

## 2018-01-10 NOTE — Plan of Care (Signed)

## 2018-01-10 NOTE — Op Note (Signed)
OPERATIVE NOTE  DATE OF SURGERY:  01/10/2018  PATIENT NAME:  Micheal [name]   DOB: 1933/01/29  MRN: [id number]  PRE-OPERATIVE DIAGNOSIS: Left bimalleolar (lateral and posterior) ankle fracture  POST-OPERATIVE DIAGNOSIS:  Same  PROCEDURE:  Open reduction and internal fixation of the left lateral malleolar ankle fracture  SURGEON:  Marciano Sequin. M.D.  ANESTHESIA: general  ESTIMATED BLOOD LOSS: Minimal  FLUIDS REPLACED: 600 mL of crystalloid  TOURNIQUET TIME: 56 minutes  DRAINS: None  IMPLANTS UTILIZED: Synthes 7 hole 1/3 tubular plate, 5 - 3.5 mm cortical screws, 1 - 4.0 mm fully threaded cancellous screws  INDICATIONS FOR SURGERY: Micheal [name] is a 84 y.o. year old male who sustained a left bimalleolar ankle fracture. After discussion of the risks and benefits of surgical intervention, the patient expressed understanding of the risks benefits and agree with plans for open reduction and internal fixation.   PROCEDURE IN DETAIL: The patient was brought into the operating room and after adequate general anesthesia, a tourniquet was placed on the patient's left thigh.The right foot and ankle were prepped with alcohol and Duraprep and draped in the usual sterile fashion. A "time-out" was performed as per usual protocol. The foot and ankle were exsanguinated using an Esmarch and the tourniquet was inflated to 300 mmHg. A lateral longitudinal incision was made in line with the distal fibula. Dissection was carried down to the fracture site and the site was debrided of hematoma and soft tissue. A provisional reduction of the distal fibular fracture was performed and position visualized using the FluoroScan. A 7 hole one third tubular plate was contoured to fit along the lateral aspect of the distal fibula. The plate was then secured using a combination of five 3.5 mm cortical screws and one4.0 mm fully threaded cancellous screws. Good reduction and hardware placement was noted using the  FluoroScan. The lateral incision was then closed in layers using first #0 Vicryl followed by #2-0 Vicryl. Skin edges were then reapproximated using skin staples. Tourniquet was deflated after total tourniquet time of 56 minutes. A sterile dressing was applied followed by application of a posterior splint.   The patient tolerated the procedure well and was transported to the PACU in stable condition.  James P. Holley Bouche., M.D.

## 2018-01-11 LAB — GLUCOSE, CAPILLARY
GLUCOSE-CAPILLARY: 167 mg/dL — AB (ref 70–99)
GLUCOSE-CAPILLARY: 79 mg/dL (ref 70–99)
Glucose-Capillary: 145 mg/dL — ABNORMAL HIGH (ref 70–99)
Glucose-Capillary: 119 mg/dL — ABNORMAL HIGH (ref 70–99)

## 2018-01-11 NOTE — Plan of Care (Signed)
  Problem: Education: Goal: Knowledge of General Education information will improve Description Including pain rating scale, medication(s)/side effects and non-pharmacologic comfort measures Outcome: Progressing   Problem: Health Behavior/Discharge Planning: Goal: Ability to manage health-related needs will improve Outcome: Progressing   Problem: Clinical Measurements: Goal: Will remain free from infection Outcome: Progressing Goal: Cardiovascular complication will be avoided Outcome: Progressing   Problem: Activity: Goal: Risk for activity intolerance will decrease Outcome: Progressing   Problem: Nutrition: Goal: Adequate nutrition will be maintained Outcome: Progressing   Problem: Elimination: Goal: Will not experience complications related to bowel motility Outcome: Progressing Goal: Will not experience complications related to urinary retention Outcome: Progressing   Problem: Pain Managment: Goal: General experience of comfort will improve Outcome: Progressing   Problem: Safety: Goal: Ability to remain free from injury will improve Outcome: Progressing   Problem: Skin Integrity: Goal: Risk for impaired skin integrity will decrease Outcome: Progressing

## 2018-01-11 NOTE — Progress Notes (Signed)
Physical Therapy Treatment Patient Details Name: Micheal [name] MRN: [id number] DOB: Mar 23, 1933 Today's Date: 01/11/2018    History of Present Illness Pt Pt. is an 84 y.o. male who was admitted for bimalleolar ankle fracture from fall off step ladder while changing batteries in smoke detector. Pt is now s/p L ankle ORIF. HIstory includes COPD, CAD, DM, GERD, and HTN.    PT Comments    Pt is making good progress towards goals with increased balance noted with standard walker. Able to maintain correct WBing status. Good endurance with there-ex, reviewed written HEP. Pt motivated to dc home, however will need wheelchair with elevated leg rest to navigate long distances due to [facility] restriction. Will continue to progress.   Follow Up Recommendations  Home health PT;Supervision/Assistance - 24 hour     Equipment Recommendations  Wheelchair (measurements PT)(for long distances) with elevating leg rest    Recommendations for Other Services       Precautions / Restrictions Precautions Precautions: Fall Restrictions Weight Bearing Restrictions: Yes LLE Weight Bearing: Non weight bearing    Mobility  Bed Mobility Overal bed mobility: Needs Assistance Bed Mobility: Supine to Sit     Supine to sit: Supervision     General bed mobility comments: safe technique with improved independence noted.   Transfers Overall transfer level: Needs assistance Equipment used: Standard walker Transfers: Sit to/from Stand Sit to Stand: Min guard         General transfer comment: cues to push from seated surface. Upright posture noted  Ambulation/Gait Ambulation/Gait assistance: Min guard Gait Distance (Feet): 50 Feet Assistive device: Standard walker Gait Pattern/deviations: Step-to pattern     General Gait Details: ambulated using standard walker with increased balance. UPright posture noted. Able to maintain correct WBing status   Stairs             Wheelchair Mobility     Modified Rankin (Stroke Patients Only)       Balance                                            Cognition Arousal/Alertness: Awake/alert Behavior During Therapy: WFL for tasks assessed/performed Overall Cognitive Status: Within Functional Limits for tasks assessed                                        Exercises Other Exercises Other Exercises: Supine ther-ex on B LE including ankle pumps (R only), quad sets, SLRs, hip add squeezes, SAQ, heel slides, and hip abd/add. ALl ther-ex performed x 10 reps with cga and safe technique. Written HEP given and reviewed with pt    General Comments        Pertinent Vitals/Pain Pain Assessment: No/denies pain    Home Living Family/patient expects to be discharged to:: Private residence Living Arrangements: Spouse/significant other Available Help at Discharge: Family;Available 24 hours/day Type of Home: House Home Access: Ramped entrance   Home Layout: One level Home Equipment: Walker - standard      Prior Function Level of Independence: Independent      Comments: Indep with ADLs, household and community mobilization wtihout assist device; no home O2.  + driving.  Denies fall history.   PT Goals (current goals can now be found in the care plan section) Acute Rehab PT Goals Patient  Stated Goal: To return home with his family PT Goal Formulation: With patient Time For Goal Achievement: 01/25/18 Potential to Achieve Goals: Good Progress towards PT goals: Progressing toward goals    Frequency    BID      PT Plan Current plan remains appropriate    Co-evaluation              AM-PAC PT "6 Clicks" Daily Activity  Outcome Measure  Difficulty turning over in bed (including adjusting bedclothes, sheets and blankets)?: None Difficulty moving from lying on back to sitting on the side of the bed? : A Little Difficulty sitting down on and standing up from a chair with arms (e.g.,  wheelchair, bedside commode, etc,.)?: Unable Help needed moving to and from a bed to chair (including a wheelchair)?: A Little Help needed walking in hospital room?: A Little Help needed climbing 3-5 steps with a railing? : A Lot 6 Click Score: 16    End of Session Equipment Utilized During Treatment: Gait belt Activity Tolerance: Patient tolerated treatment well Patient left: in bed;with bed alarm set;with SCD's reapplied Nurse Communication: Mobility status PT Visit Diagnosis: Unsteadiness on feet (R26.81);Muscle weakness (generalized) (M62.81);History of falling (Z91.81);Difficulty in walking, not elsewhere classified (R26.2);Pain Pain - Right/Left: Left Pain - part of body: Ankle and joints of foot     Time: 2376-2831 PT Time Calculation (min) (ACUTE ONLY): 23 min  Charges:  $Gait Training: 8-22 mins $Therapeutic Exercise: 8-22 mins                     Greggory Stallion, PT, DPT [phone]    Micheal [name] 01/11/2018, 2:39 PM

## 2018-01-11 NOTE — Progress Notes (Signed)
Pt unable to void after foley was removed this morning. Bladder scan done by previous RN and revealed 871ml. Pt denies abdominal discomfort. On call Dr. Leim Fabry paged and ordered for one time in and out catheterization. In and out cath done aseptically and drained 636ml of yellow, clear urine. Pt tolerated procedure well.

## 2018-01-11 NOTE — Evaluation (Signed)
Occupational Therapy Evaluation Patient Details Name: Micheal [name] MRN: [id number] DOB: 03-23-1933 Today's Date: 01/11/2018    History of Present Illness Pt. is an 84 y.o. male who was admitted for bimalleolar ankle fracture from fall off step ladder while changing batteries in smoke detector. Pt is now s/p L ankle ORIF. History includes COPD, CAD, DM, GERD, and HTN.   Clinical Impression   Pt. presents with NWB through the LLE, weakness, limited activity tolerance, and limited functional mobility which hinders her ability to complete basic ADL and IADL functioning. Pt. resides at home with his wife. Pt. was independent with ADLs, and IADL functioning: including meal preparation, and medication management. Pt. was able to drive. Pt. fell from a ladder while trying to change batteries in a smoke detector. Pt. education was provided about A/E, positioning, and compensatory strategies. Pt. could benefit from OT services for ADL training, A/E training, and pt. Education about home modification, and DME. Pt. plans to return home upon discharge, with family assistance as needed. No follow-up OT services are warranted at this time.    Follow Up Recommendations  No OT follow up    Equipment Recommendations       Recommendations for Other Services       Precautions / Restrictions Precautions Precautions: Fall Restrictions Weight Bearing Restrictions: Yes LLE Weight Bearing: Non weight bearing             ADL either performed or assessed with clinical judgement   ADL Overall ADL's : Needs assistance/impaired Eating/Feeding: Independent;Set up   Grooming: Set up;Independent   Upper Body Bathing: Independent   Lower Body Bathing: Set up;Min guard   Upper Body Dressing : Set up;Independent   Lower Body Dressing: Set up;Min guard                 General ADL Comments: Pt. education was provided about A/E, NWB status and compensatory startegies during ADLs, and IADLs      Vision Baseline Vision/History: No visual deficits Patient Visual Report: No change from baseline       Perception     Praxis      Pertinent Vitals/Pain Pain Assessment: No/denies pain     Hand Dominance Right   Extremity/Trunk Assessment Upper Extremity Assessment Upper Extremity Assessment: Overall WFL for tasks assessed         Communication Communication Communication: No difficulties   Cognition Arousal/Alertness: Awake/alert Behavior During Therapy: WFL for tasks assessed/performed Overall Cognitive Status: Within Functional Limits for tasks assessed                                     General Comments       Exercises   Shoulder Instructions      Home Living Family/patient expects to be discharged to:: Private residence Living Arrangements: Spouse/significant other Available Help at Discharge: Family;Available 24 hours/day Type of Home: House Home Access: Ramped entrance     Home Layout: One level               Home Equipment: Walker - standard          Prior Functioning/Environment Level of Independence: Independent        Comments: Indep with ADLs, household and community mobilization wtihout assist device; no home O2.  + driving.  Denies fall history.        OT Problem List: Pain;Decreased knowledge of use of DME or  AE      OT Treatment/Interventions: Self-care/ADL training;Therapeutic exercise;Patient/family education;DME and/or AE instruction;Therapeutic activities    OT Goals(Current goals can be found in the care plan section) Acute Rehab OT Goals Patient Stated Goal: To return home with his family Potential to Achieve Goals: Good  OT Frequency: Min 2X/week   Barriers to D/C:            Co-evaluation              AM-PAC PT "6 Clicks" Daily Activity     Outcome Measure Help from another person eating meals?: None Help from another person taking care of personal grooming?: None Help from  another person toileting, which includes using toliet, bedpan, or urinal?: A Little Help from another person bathing (including washing, rinsing, drying)?: A Little Help from another person to put on and taking off regular upper body clothing?: None Help from another person to put on and taking off regular lower body clothing?: A Little 6 Click Score: 21   End of Session    Activity Tolerance:   Pt. Tolerated the session well. Patient left:   In chair with call bell in reach.  OT Visit Diagnosis: Muscle weakness (generalized) (M62.81)                Time: 1020-1035 OT Time Calculation (min): 15 min Charges:  OT General Charges $OT Visit: 1 Visit OT Evaluation $OT Eval Low Complexity: 1 Low  Harrel Carina, MS, OTR/L  Harrel Carina 01/11/2018, 11:36 AM

## 2018-01-11 NOTE — Progress Notes (Addendum)
  Subjective: 1 Day Post-Op Procedure(s) (LRB): OPEN REDUCTION INTERNAL FIXATION (ORIF) ANKLE FRACTURE (Left) Patient reports pain as moderate.   Patient seen in rounds with Dr. Posey Pronto. Patient is well, and has had no acute complaints or problems Plan is to go Home versus rehab after hospital stay. Negative for chest pain and shortness of breath Fever: no Gastrointestinal: Negative for nausea and vomiting  Objective: Vital signs in last 24 hours: Temp:  [97.3 F (36.3 C)-98.6 F (37 C)] 97.9 F (36.6 C) (08/24 0541) Pulse Rate:  [56-80] 63 (08/24 0541) Resp:  [13-21] 19 (08/24 0541) BP: (97-121)/(36-64) 97/43 (08/24 0541) SpO2:  [90 %-100 %] 94 % (08/24 0544)  Intake/Output from previous day:  Intake/Output Summary (Last 24 hours) at 01/11/2018 0630 Last data filed at 01/11/2018 0408 Gross per 24 hour  Intake 2484.3 ml  Output 1570 ml  Net 914.3 ml    Intake/Output this shift: Total I/O In: 2391.9 [P.O.:240; I.V.:1751.5; IV Piggyback:400.4] Out: 110 [Urine:110]  Labs: Recent Labs    01/09/18 1657  HGB 10.1*   Recent Labs    01/09/18 1657  WBC 12.7*  RBC 3.18*  HCT 30.4*  PLT 191   Recent Labs    01/09/18 1657  NA 140  K 5.1  CL 108  CO2 29  BUN 28*  CREATININE 1.63*  GLUCOSE 95  CALCIUM 9.0   Recent Labs    01/09/18 1657  INR 1.03     EXAM General - Patient is Alert and Oriented Extremity - Sensation intact distally Dorsiflexion/Plantar flexion intact Dressing/Incision - clean, dry, with a splint intact Motor Function - intact, moving toes well on exam.   Past Medical History:  Diagnosis Date  . Balance problem    INTERMITTENT WITH NEGATIVE WORKUP  . COPD (chronic obstructive pulmonary disease) ([place])   . Coronary artery disease   . Diabetes mellitus without complication ([name])   . GERD (gastroesophageal reflux disease)   . Hypercholesteremia   . Hypertension   . Myocardial infarction ([place])    2005  . Pneumonia    RECENT 5/17  .  Shortness of breath dyspnea   . Wheezing     Assessment/Plan: 1 Day Post-Op Procedure(s) (LRB): OPEN REDUCTION INTERNAL FIXATION (ORIF) ANKLE FRACTURE (Left) Active Problems:   Bimalleolar ankle fracture, left, closed, initial encounter  Estimated body mass index is 20.22 kg/m as calculated from the following:   Height as of this encounter: 5\' 11"  (1.803 m).   Weight as of this encounter: 65.8 kg. Advance diet Up with therapy D/C IV fluids Plan for discharge tomorrow to home versus rehab  DVT Prophylaxis - Lovenox Non-weight-Bearing to left leg  Reche Dixon, PA-C Orthopaedic Surgery 01/11/2018, 6:30 AM

## 2018-01-11 NOTE — Evaluation (Signed)
Physical Therapy Evaluation Patient Details Name: Micheal [name] MRN: [id number] DOB: 1933-03-17 Today's Date: 01/11/2018   History of Present Illness  Pt admitted for bimalleolar ankle fracture from fall off step ladder while changing batteries in smoke detector. Pt is now s/p L ankle ORIF. HIstory includes COPD, CAD, DM, GERD, and HTN.  Clinical Impression  Pt is a pleasant 84 year old male who was admitted for ankle fracture and is now s/p ORIF on L foot. Pt performs bed mobility with cga, transfers with min assist, and ambulation with cga and RW. Pt able to maintain correct WBing status. Pt demonstrates deficits with strength/mobility/endurance. Pt fatigues with longer distances, would benefit from wheel chair for longer distances. Pt prefers to use standard walker, however none available at hospital. Wife to bring in his from home with progression using his SW in PM session. Pt feels comfortable dc'ing with family. Would benefit from skilled PT to address above deficits and promote optimal return to PLOF. Recommend transition to [place] upon discharge from acute hospitalization.       Follow Up Recommendations Home health PT;Supervision/Assistance - 24 hour    Equipment Recommendations  Wheelchair (measurements PT)(for long distances)    Recommendations for Other Services       Precautions / Restrictions Precautions Precautions: Fall Restrictions Weight Bearing Restrictions: Yes LLE Weight Bearing: Non weight bearing      Mobility  Bed Mobility Overal bed mobility: Needs Assistance Bed Mobility: Supine to Sit     Supine to sit: Min guard     General bed mobility comments: safe technique with ability to sit at EOB with upright posture. NO pain noted  Transfers Overall transfer level: Needs assistance Equipment used: Rolling walker (2 wheeled) Transfers: Sit to/from Stand Sit to Stand: Min assist         General transfer comment: cues for pushing from seated surface  as pt tends to pull on RW. ONce standing, able to maintain correct WBing status. UPright posture noted  Ambulation/Gait Ambulation/Gait assistance: Min guard Gait Distance (Feet): 3 Feet Assistive device: Rolling walker (2 wheeled) Gait Pattern/deviations: Step-to pattern     General Gait Details: needs cues to maintain correct WBing status, however able to ambulate to recliner safely. Has more difficulty with retro hopping back towards recliner. NO fatigue present. Further ambulation in ther-ex  Stairs            Wheelchair Mobility    Modified Rankin (Stroke Patients Only)       Balance Overall balance assessment: Needs assistance Sitting-balance support: Feet supported Sitting balance-Leahy Scale: Good     Standing balance support: Bilateral upper extremity supported Standing balance-Leahy Scale: Fair                               Pertinent Vitals/Pain Pain Assessment: No/denies pain    Home Living Family/patient expects to be discharged to:: Private residence Living Arrangements: Spouse/significant other Available Help at Discharge: Family;Available 24 hours/day Type of Home: House Home Access: Ramped entrance     Home Layout: One level Home Equipment: Walker - standard      Prior Function Level of Independence: Independent         Comments: Indep with ADLs, household and community mobilization wtihout assist device; no home O2.  + driving.  Denies fall history.     Hand Dominance        Extremity/Trunk Assessment   Upper Extremity Assessment Upper  Extremity Assessment: Overall WFL for tasks assessed    Lower Extremity Assessment Lower Extremity Assessment: Generalized weakness(L LE grossly 3/5; R LE Grossly 4+/5)       Communication   Communication: No difficulties  Cognition Arousal/Alertness: Awake/alert Behavior During Therapy: WFL for tasks assessed/performed Overall Cognitive Status: Within Functional Limits for  tasks assessed                                        General Comments      Exercises Other Exercises Other Exercises: Supine ther-ex on B LE including quad sets, SLRs, and hip abd/add. ALl ther-ex performed x 10 reps with cga and safe technique Other Exercises: FUrther ambulation performed with RW x 40' with cues for WBing and controlled decent on R foot. IMproved technique with increased ambulation, however slight fatigue present. Able to maintain correct WBing status.   Assessment/Plan    PT Assessment Patient needs continued PT services  PT Problem List Decreased strength;Decreased balance;Decreased mobility;Decreased knowledge of use of DME;Pain       PT Treatment Interventions DME instruction;Gait training;Therapeutic exercise;Balance training    PT Goals (Current goals can be found in the Care Plan section)  Acute Rehab PT Goals Patient Stated Goal: to go home with family PT Goal Formulation: With patient Time For Goal Achievement: 01/25/18 Potential to Achieve Goals: Good    Frequency BID   Barriers to discharge        Co-evaluation               AM-PAC PT "6 Clicks" Daily Activity  Outcome Measure Difficulty turning over in bed (including adjusting bedclothes, sheets and blankets)?: Unable Difficulty moving from lying on back to sitting on the side of the bed? : Unable Difficulty sitting down on and standing up from a chair with arms (e.g., wheelchair, bedside commode, etc,.)?: Unable Help needed moving to and from a bed to chair (including a wheelchair)?: A Little Help needed walking in hospital room?: A Little Help needed climbing 3-5 steps with a railing? : A Lot 6 Click Score: 11    End of Session Equipment Utilized During Treatment: Gait belt Activity Tolerance: Patient tolerated treatment well Patient left: in chair;with chair alarm set Nurse Communication: Mobility status PT Visit Diagnosis: Unsteadiness on feet (R26.81);Muscle  weakness (generalized) (M62.81);History of falling (Z91.81);Difficulty in walking, not elsewhere classified (R26.2);Pain Pain - Right/Left: Left Pain - part of body: Ankle and joints of foot    Time: 7116-5790 PT Time Calculation (min) (ACUTE ONLY): 39 min   Charges:   PT Evaluation $PT Eval Low Complexity: 1 Low PT Treatments $Gait Training: 8-22 mins $Therapeutic Exercise: 8-22 mins        Micheal [name], PT, DPT [phone]   Micheal [name] 01/11/2018, 10:27 AM

## 2018-01-12 LAB — GLUCOSE, CAPILLARY
GLUCOSE-CAPILLARY: 72 mg/dL (ref 70–99)
Glucose-Capillary: 131 mg/dL — ABNORMAL HIGH (ref 70–99)

## 2018-01-12 MED ORDER — ENOXAPARIN SODIUM 40 MG/0.4ML ~~LOC~~ SOLN: 40.0000 mg | SUBCUTANEOUS | 0 refills | Status: DC

## 2018-01-12 MED ORDER — TRAMADOL HCL 50 MG PO TABS: 50.0000 mg | ORAL_TABLET | ORAL | 1 refills | Status: DC | PRN

## 2018-01-12 MED ORDER — OXYCODONE HCL 5 MG PO TABS: 5.0000 mg | ORAL_TABLET | ORAL | 0 refills | Status: DC | PRN

## 2018-01-12 MED ORDER — TAMSULOSIN HCL 0.4 MG PO CAPS
0.4000 mg | ORAL_CAPSULE | Freq: Every day | ORAL | Status: DC
  Administered 2018-01-12: 0.4 mg via ORAL
  Filled 2018-01-12: qty 1

## 2018-01-12 NOTE — Progress Notes (Addendum)
  Subjective: 2 Days Post-Op Procedure(s) (LRB): OPEN REDUCTION INTERNAL FIXATION (ORIF) ANKLE FRACTURE (Left) Patient reports pain as mild to moderate.   Patient seen in rounds with Dr. Posey Pronto. Patient is well, and has had no acute complaints or problems.  The patient has had to do in and out catheterization for urinary retention.  This is slowly improving. Plan is to go Home versus rehab after hospital stay. Negative for chest pain and shortness of breath Fever: no Gastrointestinal: Negative for nausea and vomiting  Objective: Vital signs in last 24 hours: Temp:  [97.7 F (36.5 C)-98 F (36.7 C)] 97.8 F (36.6 C) (08/24 2348) Pulse Rate:  [64-73] 73 (08/24 2348) Resp:  [18-19] 19 (08/24 2348) BP: (105-121)/(47-54) 114/49 (08/24 2348) SpO2:  [91 %-97 %] 92 % (08/24 2348)  Intake/Output from previous day:  Intake/Output Summary (Last 24 hours) at 01/12/2018 0706 Last data filed at 01/12/2018 0554 Gross per 24 hour  Intake 2481.58 ml  Output 850 ml  Net 1631.58 ml    Intake/Output this shift: No intake/output data recorded.  Labs: Recent Labs    01/09/18 1657  HGB 10.1*   Recent Labs    01/09/18 1657  WBC 12.7*  RBC 3.18*  HCT 30.4*  PLT 191   Recent Labs    01/09/18 1657  NA 140  K 5.1  CL 108  CO2 29  BUN 28*  CREATININE 1.63*  GLUCOSE 95  CALCIUM 9.0   Recent Labs    01/09/18 1657  INR 1.03     EXAM General - Patient is Alert and Oriented Extremity - Sensation intact distally Dorsiflexion/Plantar flexion intact Dressing/Incision - clean, dry, with a splint intact Motor Function - intact, moving toes well on exam.   Past Medical History:  Diagnosis Date  . Balance problem    INTERMITTENT WITH NEGATIVE WORKUP  . COPD (chronic obstructive pulmonary disease) ([place])   . Coronary artery disease   . Diabetes mellitus without complication ([place])   . GERD (gastroesophageal reflux disease)   . Hypercholesteremia   . Hypertension   . Myocardial  infarction ([place])    2005  . Pneumonia    RECENT 5/17  . Shortness of breath dyspnea   . Wheezing     Assessment/Plan: 2 Days Post-Op Procedure(s) (LRB): OPEN REDUCTION INTERNAL FIXATION (ORIF) ANKLE FRACTURE (Left) Active Problems:   Bimalleolar ankle fracture, left, closed, initial encounter  Estimated body mass index is 20.22 kg/m as calculated from the following:   Height as of this encounter: 5\' 11"  (1.803 m).   Weight as of this encounter: 65.8 kg. Up with physical therapy.  Bowel management.  Discharge home today In and out catheterization today for urinary retention.  Able to discharge home if continues to improve.  Flomax was added last night.  DVT Prophylaxis - Lovenox Non-weight-Bearing to left leg  Reche Dixon, PA-C Orthopaedic Surgery 01/12/2018, 7:06 AM

## 2018-01-12 NOTE — Progress Notes (Signed)
Physical Therapy Treatment Patient Details Name: Micheal [name] MRN: [id number] DOB: 1932-12-19 Today's Date: 01/12/2018    History of Present Illness Pt Pt. is an 84 y.o. male who was admitted for bimalleolar ankle fracture from fall off step ladder while changing batteries in smoke detector. Pt is now s/p L ankle ORIF. HIstory includes COPD, CAD, DM, GERD, and HTN.    PT Comments    Pt is making gradual progress towards goals, however is limited by fatigue secondary to Tracy status. Safe technique with standard walker, required WC follow for long distances. Small steps noted during ambulation and slow speed. Good endurance with there-ex and reviewed HEP. Continue to recommend HHPT and wheelchair with elevating leg rest to facilitate safe dc home. Pt has met goals for dc, pt planning for dc this date.   Follow Up Recommendations  Home health PT;Supervision/Assistance - 24 hour     Equipment Recommendations  Wheelchair (measurements PT)(for long distances)    Recommendations for Other Services       Precautions / Restrictions Precautions Precautions: Fall Restrictions Weight Bearing Restrictions: Yes LLE Weight Bearing: Non weight bearing    Mobility  Bed Mobility Overal bed mobility: Needs Assistance Bed Mobility: Supine to Sit     Supine to sit: Supervision     General bed mobility comments: safe technique with improved independence noted.   Transfers Overall transfer level: Needs assistance Equipment used: Standard walker Transfers: Sit to/from Stand Sit to Stand: Min guard         General transfer comment: cues to push from seated surface. Upright posture noted  Ambulation/Gait Ambulation/Gait assistance: Min guard Gait Distance (Feet): 50 Feet Assistive device: Standard walker Gait Pattern/deviations: Step-to pattern     General Gait Details: ambulated in room and in hallway with WC follow. 1 seated rest break needed and cga for safety. Occasional cues  for WBing status as he tends to rest it on the floor.    Stairs             Wheelchair Mobility    Modified Rankin (Stroke Patients Only)       Balance                                            Cognition Arousal/Alertness: Awake/alert(sleepy initially, however awakens easily)   Overall Cognitive Status: Within Functional Limits for tasks assessed                                        Exercises Other Exercises Other Exercises: Supine ther-ex on B LE including ankle pumps (R only), quad sets, SLRs, hip add squeezes, SAQ, heel slides, glut sets, and hip abd/add. ALl ther-ex performed x 12 reps with cga and safe technique. Written HEP given and reviewed with pt    General Comments        Pertinent Vitals/Pain Pain Assessment: 0-10 Pain Score: 8  Pain Location: L leg Pain Descriptors / Indicators: Operative site guarding;Discomfort Pain Intervention(s): Limited activity within patient's tolerance;Ice applied    Home Living                      Prior Function            PT Goals (current goals can now be found  in the care plan section) Acute Rehab PT Goals Patient Stated Goal: To return home with his family PT Goal Formulation: With patient Time For Goal Achievement: 01/25/18 Potential to Achieve Goals: Good Progress towards PT goals: Progressing toward goals    Frequency    BID      PT Plan Current plan remains appropriate    Co-evaluation              AM-PAC PT "6 Clicks" Daily Activity  Outcome Measure  Difficulty turning over in bed (including adjusting bedclothes, sheets and blankets)?: None Difficulty moving from lying on back to sitting on the side of the bed? : A Little Difficulty sitting down on and standing up from a chair with arms (e.g., wheelchair, bedside commode, etc,.)?: Unable Help needed moving to and from a bed to chair (including a wheelchair)?: A Little Help needed walking in  hospital room?: A Little Help needed climbing 3-5 steps with a railing? : A Lot 6 Click Score: 16    End of Session Equipment Utilized During Treatment: Gait belt Activity Tolerance: Patient tolerated treatment well Patient left: in chair;with chair alarm set Nurse Communication: Mobility status PT Visit Diagnosis: Unsteadiness on feet (R26.81);Muscle weakness (generalized) (M62.81);History of falling (Z91.81);Difficulty in walking, not elsewhere classified (R26.2);Pain Pain - Right/Left: Left Pain - part of body: Ankle and joints of foot     Time: 2395-3202 PT Time Calculation (min) (ACUTE ONLY): 27 min  Charges:  $Gait Training: 8-22 mins $Therapeutic Exercise: 8-22 mins                     Greggory Stallion, PT, DPT [phone]    Micheal [name] 01/12/2018, 10:22 AM

## 2018-01-12 NOTE — Discharge Instructions (Signed)
INSTRUCTIONS AFTER Surgery  o Remove items at home which could result in a fall. This includes throw rugs or furniture in walking pathways o ICE to the affected joint every three hours while awake for 30 minutes at a time, for at least the first 3-5 days, and then as needed for pain and swelling.  Continue to use ice for pain and swelling. You may notice swelling that will progress down to the foot and ankle.  This is normal after surgery.  Elevate your leg when you are not up walking on it.   o Continue to use the breathing machine you got in the hospital (incentive spirometer) which will help keep your temperature down.  It is common for your temperature to cycle up and down following surgery, especially at night when you are not up moving around and exerting yourself.  The breathing machine keeps your lungs expanded and your temperature down.   DIET:  As you were doing prior to hospitalization, we recommend a well-balanced diet.  DRESSING / WOUND CARE / SHOWERING  Leave the splint and dressing in place.  He will be removed at [facility] clinic in 2 weeks.  ACTIVITY  o Increase activity slowly as tolerated, but follow the weight bearing instructions below.   o No driving for 6 weeks or until further direction given by your physician.  You cannot drive while taking narcotics.  o No lifting or carrying greater than 10 lbs. until further directed by your surgeon. o Avoid periods of inactivity such as sitting longer than an hour when not asleep. This helps prevent blood clots.  o You may return to work once you are authorized by your doctor.     WEIGHT BEARING  Nonweightbearing on the left leg with a walker.   EXERCISES Ambulation with her walker but no ankle exercises.  CONSTIPATION  Constipation is defined medically as fewer than three stools per week and severe constipation as less than one stool per week.  Even if you have a regular bowel pattern at home, your normal regimen is likely  to be disrupted due to multiple reasons following surgery.  Combination of anesthesia, postoperative narcotics, change in appetite and fluid intake all can affect your bowels.   YOU MUST use at least one of the following options; they are listed in order of increasing strength to get the job done.  They are all available over the counter, and you may need to use some, POSSIBLY even all of these options:    Drink plenty of fluids (prune juice may be helpful) and high fiber foods Colace 100 mg by mouth twice a day  Senokot for constipation as directed and as needed Dulcolax (bisacodyl), take with full glass of water  Miralax (polyethylene glycol) once or twice a day as needed.  If you have tried all these things and are unable to have a bowel movement in the first 3-4 days after surgery call either your surgeon or your primary doctor.    If you experience loose stools or diarrhea, hold the medications until you stool forms back up.  If your symptoms do not get better within 1 week or if they get worse, check with your doctor.  If you experience "the worst abdominal pain ever" or develop nausea or vomiting, please contact the office immediately for further recommendations for treatment.   ITCHING:  If you experience itching with your medications, try taking only a single pain pill, or even half a pain pill at a  time.  You can also use Benadryl over the counter for itching or also to help with sleep.   TED HOSE STOCKINGS:  Use stockings on both legs until for at least 2 weeks or as directed by physician office. They may be removed at night for sleeping.  MEDICATIONS:  See your medication summary on the After Visit Summary that nursing will review with you.  You may have some home medications which will be placed on hold until you complete the course of blood thinner medication.  It is important for you to complete the blood thinner medication as prescribed.  PRECAUTIONS:  If you experience chest  pain or shortness of breath - call 911 immediately for transfer to the hospital emergency department.   If you develop a fever greater that 101 F, purulent drainage from wound, increased redness or drainage from wound, foul odor from the wound/dressing, or calf pain - CONTACT YOUR SURGEON.                                                   FOLLOW-UP APPOINTMENTS:  If you do not already have a post-op appointment, please call the office for an appointment to be seen by your surgeon.  Guidelines for how soon to be seen are listed in your After Visit Summary, but are typically between 1-4 weeks after surgery.  OTHER INSTRUCTIONS:     MAKE SURE YOU:   Understand these instructions.   Get help right away if you are not doing well or get worse.    Thank you for letting us be a part of your medical care team.  It is a privilege we respect greatly.  We hope these instructions will help you stay on track for a fast and full recovery!

## 2018-01-12 NOTE — Anesthesia Postprocedure Evaluation (Signed)
Anesthesia Post Note  Patient: Micheal [name]  Procedure(s) Performed: OPEN REDUCTION INTERNAL FIXATION (ORIF) ANKLE FRACTURE (Left Ankle)  Patient location during evaluation: PACU Anesthesia Type: Spinal Level of consciousness: awake and alert Pain management: pain level controlled Vital Signs Assessment: post-procedure vital signs reviewed and stable Respiratory status: spontaneous breathing, nonlabored ventilation, respiratory function stable and patient connected to nasal cannula oxygen Cardiovascular status: blood pressure returned to baseline and stable Postop Assessment: no apparent nausea or vomiting Anesthetic complications: no     Last Vitals:  Vitals:   01/11/18 2348 01/12/18 0806  BP: (!) 114/49 (!) 102/56  Pulse: 73 71  Resp: 19 16  Temp: 36.6 C 36.4 C  SpO2: 92% 93%    Last Pain:  Vitals:   01/12/18 0806  TempSrc: Oral  PainSc:                  Molli Barrows

## 2018-01-12 NOTE — Care Management Note (Signed)
Case Management Note  Patient Details  Name: Micheal [name] MRN: [id number] Date of Birth: 1932-09-09  Subjective/Objective:   Patient to be discharged per MD order. Orders in place for home health services. Patient to be discharged with home health services. Spoke primarily with spouse Micheal [name] per patient preference. Micheal [name] is agreeable to home health services and prefers [place]. Referral placed with Jermaine from [facility] who agrees to set up patient with PT services. [name] a new walker for patient and brought it to the room, no other DME needs. Micheal [name] to bring patient home. [name] RN BSN RNCM [phone]                   Action/Plan:   Expected Discharge Date:  01/12/18               Expected Discharge Plan:  [place]  In-House Referral:     Discharge planning Services  CM Consult  Post Acute Care Choice:  Home Health Choice offered to:  Patient, Spouse  DME Arranged:    DME Agency:     HH Arranged:  PT [place]:  [place]  Status of Service:  Completed, signed off  If discussed at [place] of Stay Meetings, dates discussed:    Additional Comments:  [name], RN 01/12/2018, 9:18 AM

## 2018-01-12 NOTE — Discharge Summary (Signed)
Physician Discharge Summary  Subjective: 2 Days Post-Op Procedure(s) (LRB): OPEN REDUCTION INTERNAL FIXATION (ORIF) ANKLE FRACTURE (Left) Patient reports pain as moderate.   Patient seen in rounds with Dr. Posey Pronto. Patient is well, and has had no acute complaints or problems Patient is ready to go home with home health physical therapy  Physician Discharge Summary  Patient ID: Micheal [name] MRN: [id number] DOB/AGE: 1932-09-08 84 y.o.  Admit date: 01/09/2018 Discharge date: 01/12/2018  Admission Diagnoses:  Discharge Diagnoses:  Active Problems:   Bimalleolar ankle fracture, left, closed, initial encounter   Discharged Condition: fair  Hospital Course: The patient is postop day 2 from a left ankle ORIF.  He did well yesterday but only ambulated 50 feet.  His pain is becoming more controlled.  His vitals have remained stable with slightly low blood pressure.  The patient will be discharged home today.  Treatments: surgery:  Open reduction and internal fixation of the left lateral malleolar ankle fracture  SURGEON:  Dereck Leep, Jr. M.D.  ANESTHESIA: general  ESTIMATED BLOOD LOSS: Minimal  FLUIDS REPLACED: 600 mL of crystalloid  TOURNIQUET TIME: 56 minutes  DRAINS: None  IMPLANTS UTILIZED: Synthes 7 hole 1/3 tubular plate, 5 - 3.5 mm cortical screws, 1 - 4.0 mm fully threaded cancellous screws  Discharge Exam: Blood pressure (!) 114/49, pulse 73, temperature 97.8 F (36.6 C), temperature source Oral, resp. rate 19, height 5\' 11"  (1.803 m), weight 65.8 kg, SpO2 92 %.   Disposition: Discharge disposition: 01-Home or Self Care        Allergies as of 01/12/2018   No Known Allergies     Medication List    STOP taking these medications   aspirin EC 81 MG tablet     TAKE these medications   albuterol 108 (90 Base) MCG/ACT inhaler Commonly known as:  PROVENTIL HFA;VENTOLIN HFA Inhale 2 puffs into the lungs every 6 (six) hours as needed for wheezing or  shortness of breath.   atorvastatin 40 MG tablet Commonly known as:  LIPITOR Take 1 tablet by mouth daily.   carvedilol 25 MG tablet Commonly known as:  COREG Take 25 mg by mouth 2 (two) times daily with a meal.   enoxaparin 40 MG/0.4ML injection Commonly known as:  LOVENOX Inject 0.4 mLs (40 mg total) into the skin daily.   Fluticasone-Salmeterol 100-50 MCG/DOSE Aepb Commonly known as:  ADVAIR Inhale 1 puff into the lungs 2 (two) times daily.   glipiZIDE 2.5 MG 24 hr tablet Commonly known as:  GLUCOTROL XL Take 2.5 mg by mouth daily with breakfast.   lisinopril 10 MG tablet Commonly known as:  PRINIVIL,ZESTRIL Take 10 mg by mouth daily.   metFORMIN 500 MG tablet Commonly known as:  GLUCOPHAGE Take 2 tablets (1000MG ) by mouth every morning and 1 tablet (500MG ) by mouth every evening   oxyCODONE 5 MG immediate release tablet Commonly known as:  Oxy IR/ROXICODONE Take 1 tablet (5 mg total) by mouth every 4 (four) hours as needed for moderate pain (pain score 4-6).   tiotropium 18 MCG inhalation capsule Commonly known as:  SPIRIVA Place 1 capsule (18 mcg total) into inhaler and inhale daily.   traMADol 50 MG tablet Commonly known as:  ULTRAM Take 1-2 tablets (50-100 mg total) by mouth every 4 (four) hours as needed for moderate pain.      Follow-up Information    Hooten, Laurice Record, MD Follow up in 2 week(s).   Specialty:  Orthopedic Surgery Why:  For staple removal.  Contact information: [place] [address] [phone]           Signed: Prescott Parma, Daylen Hack 01/12/2018, 7:13 AM   Objective: Vital signs in last 24 hours: Temp:  [97.7 F (36.5 C)-98 F (36.7 C)] 97.8 F (36.6 C) (08/24 2348) Pulse Rate:  [64-73] 73 (08/24 2348) Resp:  [18-19] 19 (08/24 2348) BP: (105-121)/(47-54) 114/49 (08/24 2348) SpO2:  [91 %-97 %] 92 % (08/24 2348)  Intake/Output from previous day:  Intake/Output Summary (Last 24 hours) at  01/12/2018 0713 Last data filed at 01/12/2018 0554 Gross per 24 hour  Intake 2481.58 ml  Output 850 ml  Net 1631.58 ml    Intake/Output this shift: No intake/output data recorded.  Labs: Recent Labs    01/09/18 1657  HGB 10.1*   Recent Labs    01/09/18 1657  WBC 12.7*  RBC 3.18*  HCT 30.4*  PLT 191   Recent Labs    01/09/18 1657  NA 140  K 5.1  CL 108  CO2 29  BUN 28*  CREATININE 1.63*  GLUCOSE 95  CALCIUM 9.0   Recent Labs    01/09/18 1657  INR 1.03    EXAM: General - Patient is Alert and Oriented Extremity - Sensation intact distally Dorsiflexion/Plantar flexion intact Incision - clean, dry, no drainage with the splint intact Motor Function -plantar flexion and dorsiflexion of toes intact.  Assessment/Plan: 2 Days Post-Op Procedure(s) (LRB): OPEN REDUCTION INTERNAL FIXATION (ORIF) ANKLE FRACTURE (Left) Procedure(s) (LRB): OPEN REDUCTION INTERNAL FIXATION (ORIF) ANKLE FRACTURE (Left) Past Medical History:  Diagnosis Date  . Balance problem    INTERMITTENT WITH NEGATIVE WORKUP  . COPD (chronic obstructive pulmonary disease) ([place])   . Coronary artery disease   . Diabetes mellitus without complication ([place])   . GERD (gastroesophageal reflux disease)   . Hypercholesteremia   . Hypertension   . Myocardial infarction ([place])    2005  . Pneumonia    RECENT 5/17  . Shortness of breath dyspnea   . Wheezing    Active Problems:   Bimalleolar ankle fracture, left, closed, initial encounter  Estimated body mass index is 20.22 kg/m as calculated from the following:   Height as of this encounter: 5\' 11"  (1.803 m).   Weight as of this encounter: 65.8 kg. [facility] diet Up with therapy  Plan to discharge home today. Diet - Regular diet Follow up - in 2 weeks Activity - NWB Disposition - Home Condition Upon Discharge - Stable DVT Prophylaxis - Lovenox  Reche Dixon, PA-C Orthopaedic Surgery 01/12/2018, 7:13 AM

## 2018-01-12 NOTE — Plan of Care (Signed)
  Problem: Activity: Goal: Risk for activity intolerance will decrease Outcome: Progressing   Problem: Elimination: Goal: Will not experience complications related to bowel motility Outcome: Progressing Goal: Will not experience complications related to urinary retention Outcome: Progressing   Problem: Pain Managment: Goal: General experience of comfort will improve Outcome: Progressing   

## 2018-01-12 NOTE — Progress Notes (Signed)
Pt attempted to void with no success. Pt claimed he "feels like there's no urge" for him to urinate. Bladder scan done and revealed 631ml. Dr. Leim Fabry paged and ordered to do another in and out catheterization and to start pt on Flomax this morning. In and out cath done aseptically, drained 211ml of clear, yellow urine. Flomax 0.4mg  PO given as ordered. Pt encouraged increased oral intake. Will continue to monitor.

## 2018-01-13 ENCOUNTER — Encounter: Payer: Self-pay | Admitting: Orthopedic Surgery

## 2018-09-10 IMAGING — DX DG ANKLE COMPLETE 3+V*L*
3 series · 3 of 3 positions shown · non-contrast
Comparison: None.

CLINICAL DATA: Pain status post fall.

EXAM:
LEFT ANKLE COMPLETE - 3+ VIEW

[ankle ap]
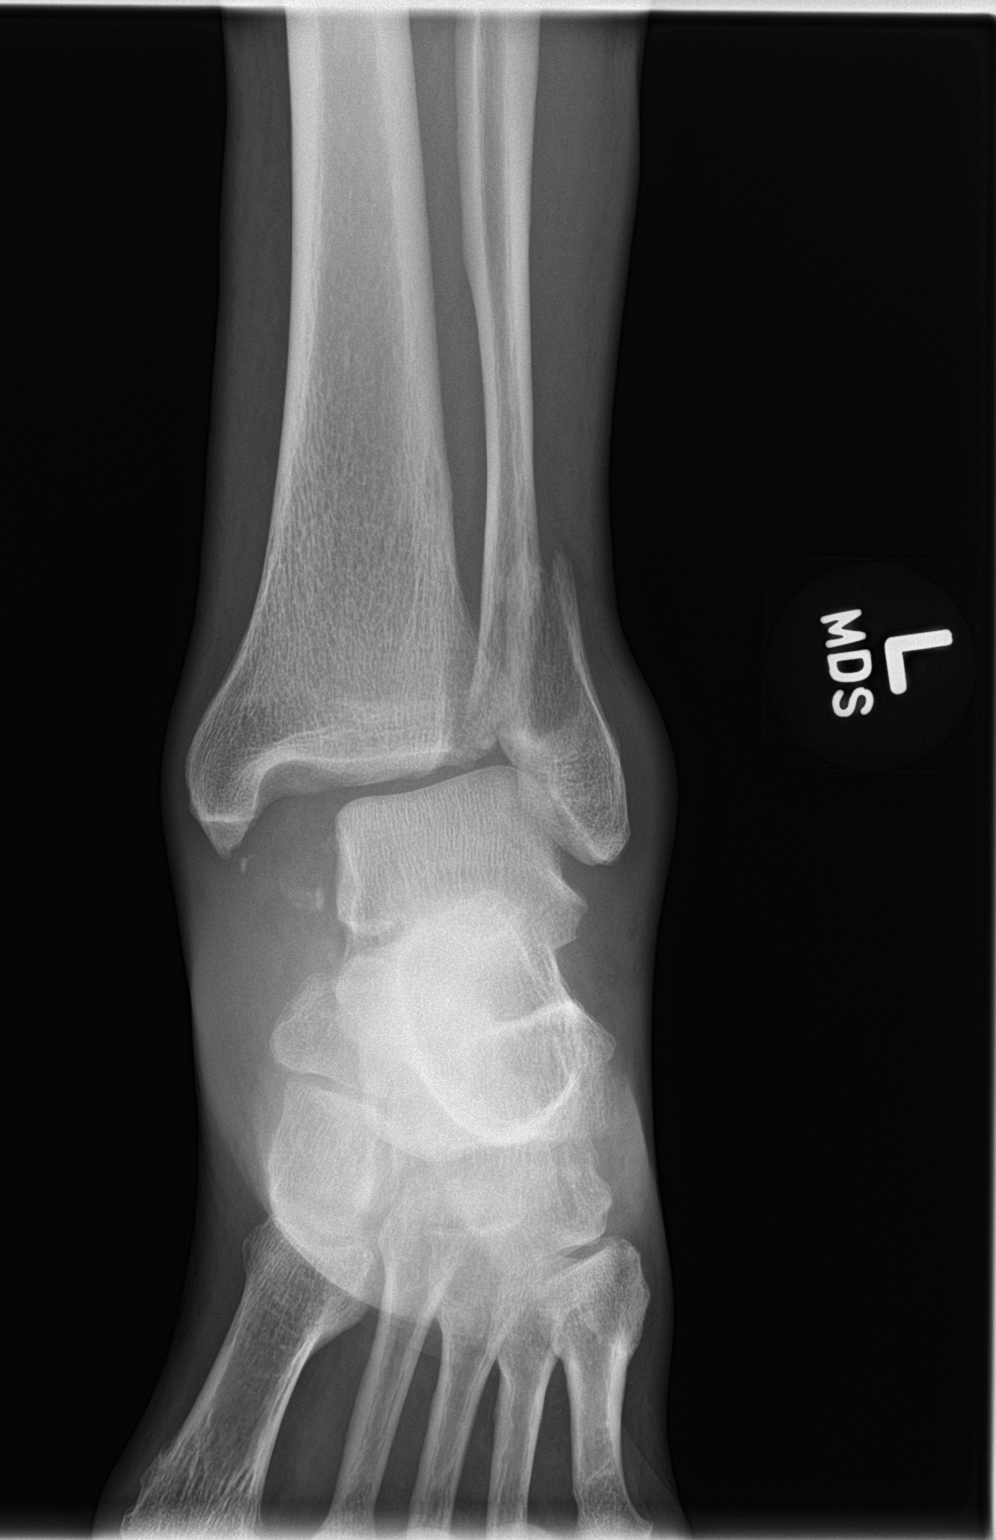

[ankle obl]
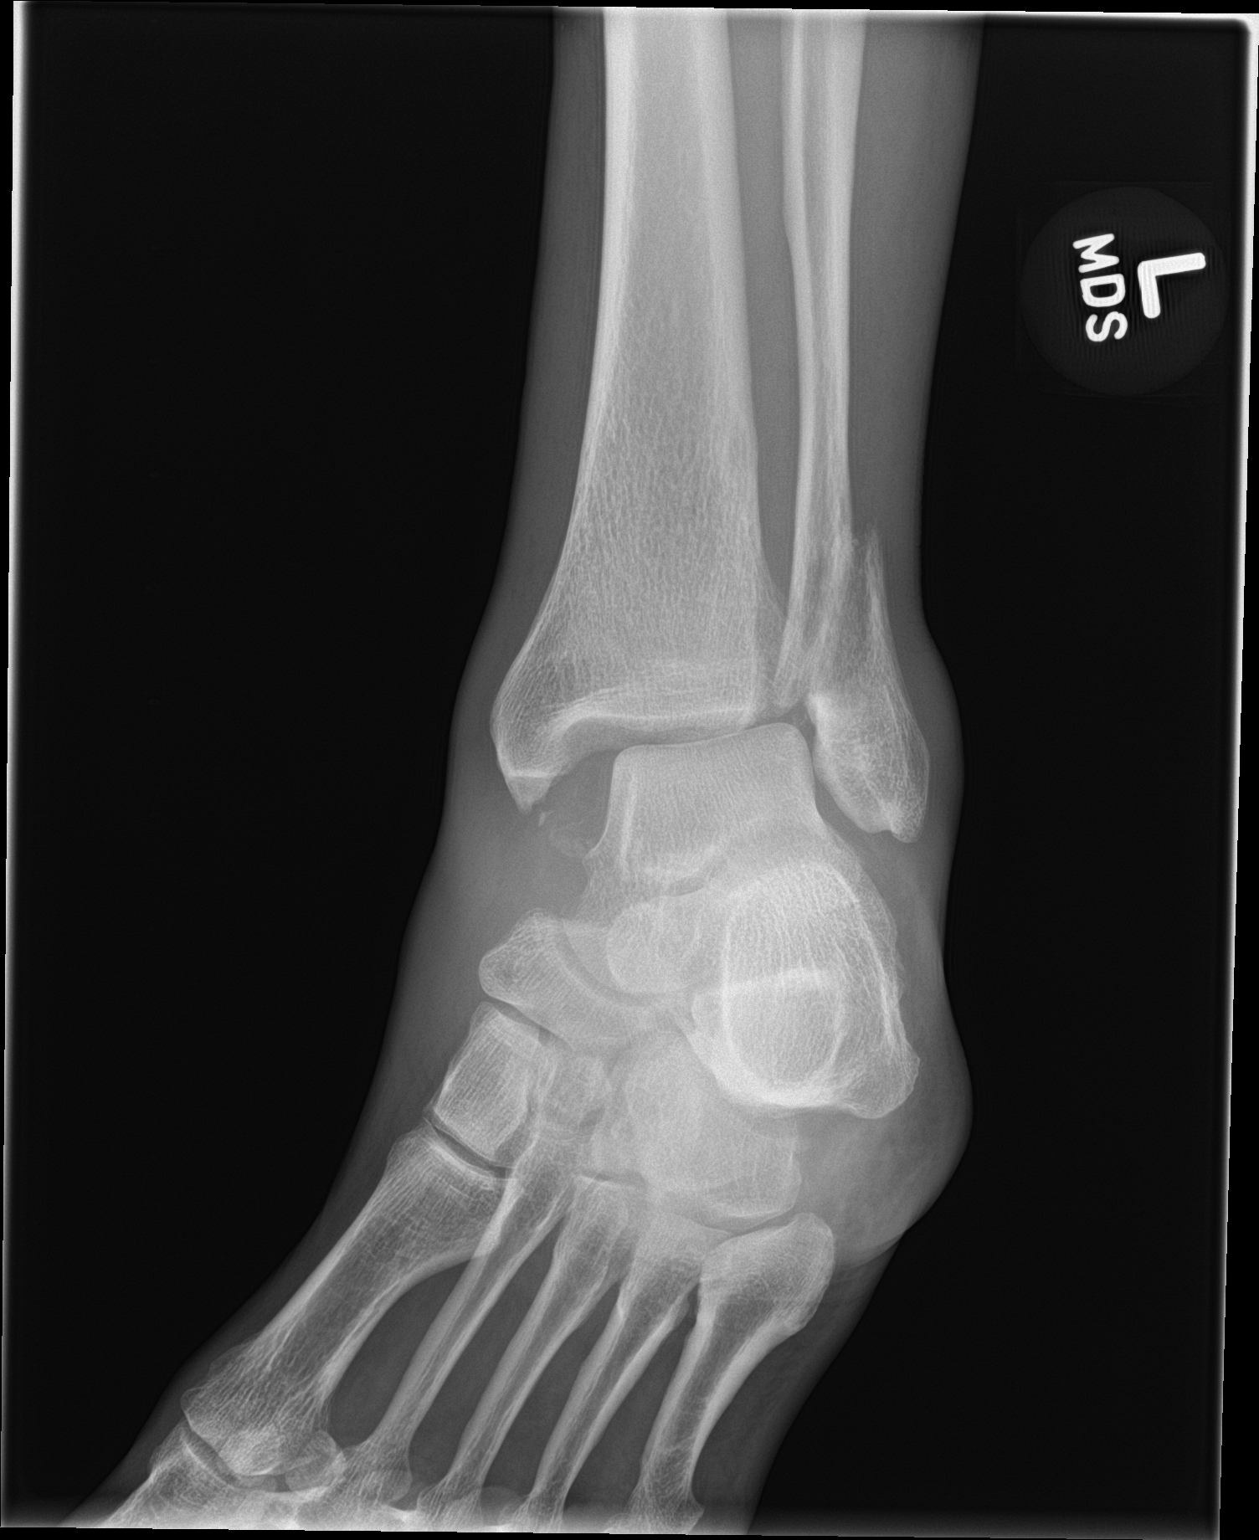

[ankle lat]
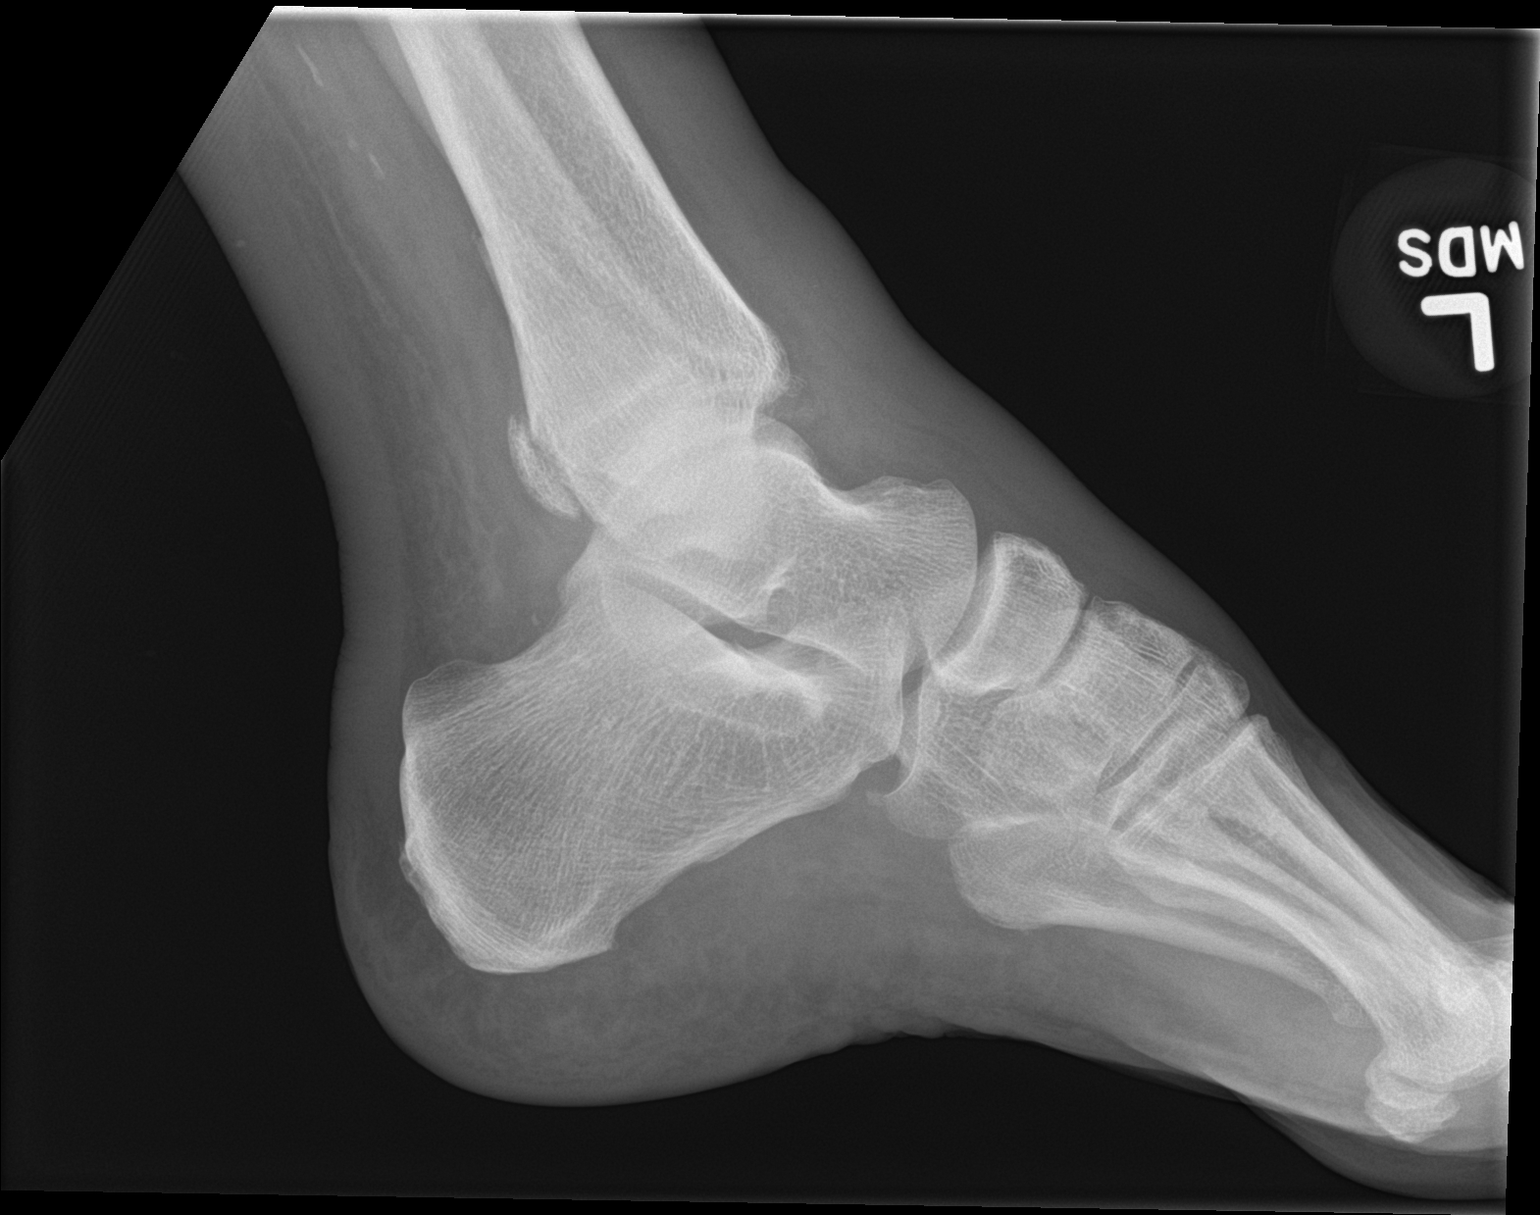

[3 of 3 positions shown; findings below may reference images not displayed]

FINDINGS: There is significant distortion of the ankle mortise, with prominent
medial widening related to displacement of the tibia relative to the
underlying talus. Associated small avulsion fracture fragments
underlying the medial malleolus, largest measuring 7 mm.

Additional displaced fracture within the distal fibula, obliquely
oriented, centered within the distal metaphysis. Associated
narrowing of the lateral portion of the ankle mortise. Additional
small avulsion fracture fragment within the lateral portion of the
tibiotalar joint space.

No fracture identified within the talus. Visualized portions of the
calcaneus and midfoot appear intact and normally aligned.
IMPRESSION: 1. Significant distortion of the ankle mortise, with prominent
medial widening due to medial displacement of the distal tibia.
Associated avulsion fracture fragments underlying the medial
malleolus.
2. Displaced fracture of the distal fibula, obliquely oriented, with
slight overlapping of the main fracture fragments. Additional small
avulsion fracture fragment within the underlying lateral portion of
the tibiotalar joint space.

## 2018-11-27 ENCOUNTER — Emergency Department
Admission: EM | Admit: 2018-11-27 | Discharge: 2018-11-27 | Disposition: A | Discharge status: Other Acute Inpatient Hospital | Payer: Medicare Other | Attending: Emergency Medicine | Admitting: Emergency Medicine

## 2018-11-27 ENCOUNTER — Emergency Department: Payer: Medicare Other

## 2018-11-27 ENCOUNTER — Other Ambulatory Visit: Payer: Self-pay

## 2018-11-27 DIAGNOSIS — Y939 Activity, unspecified: Secondary | ICD-10-CM | POA: Insufficient documentation

## 2018-11-27 DIAGNOSIS — Z03818 Encounter for observation for suspected exposure to other biological agents ruled out: Secondary | ICD-10-CM | POA: Diagnosis not present

## 2018-11-27 DIAGNOSIS — E119 Type 2 diabetes mellitus without complications: Secondary | ICD-10-CM | POA: Diagnosis not present

## 2018-11-27 DIAGNOSIS — W19XXXA Unspecified fall, initial encounter: Secondary | ICD-10-CM | POA: Insufficient documentation

## 2018-11-27 DIAGNOSIS — I10 Essential (primary) hypertension: Secondary | ICD-10-CM | POA: Diagnosis not present

## 2018-11-27 DIAGNOSIS — Y999 Unspecified external cause status: Secondary | ICD-10-CM | POA: Insufficient documentation

## 2018-11-27 DIAGNOSIS — S0990XA Unspecified injury of head, initial encounter: Secondary | ICD-10-CM | POA: Diagnosis not present

## 2018-11-27 DIAGNOSIS — Y92008 Other place in unspecified non-institutional (private) residence as the place of occurrence of the external cause: Secondary | ICD-10-CM | POA: Insufficient documentation

## 2018-11-27 DIAGNOSIS — J449 Chronic obstructive pulmonary disease, unspecified: Secondary | ICD-10-CM | POA: Diagnosis not present

## 2018-11-27 DIAGNOSIS — Z79899 Other long term (current) drug therapy: Secondary | ICD-10-CM | POA: Insufficient documentation

## 2018-11-27 DIAGNOSIS — S270XXA Traumatic pneumothorax, initial encounter: Secondary | ICD-10-CM

## 2018-11-27 DIAGNOSIS — S2241XA Multiple fractures of ribs, right side, initial encounter for closed fracture: Secondary | ICD-10-CM

## 2018-11-27 DIAGNOSIS — S29091A Other injury of muscle and tendon of front wall of thorax, initial encounter: Secondary | ICD-10-CM | POA: Diagnosis present

## 2018-11-27 DIAGNOSIS — Z23 Encounter for immunization: Secondary | ICD-10-CM | POA: Insufficient documentation

## 2018-11-27 DIAGNOSIS — Z7984 Long term (current) use of oral hypoglycemic drugs: Secondary | ICD-10-CM | POA: Diagnosis not present

## 2018-11-27 DIAGNOSIS — R109 Unspecified abdominal pain: Secondary | ICD-10-CM | POA: Diagnosis not present

## 2018-11-27 LAB — CBC WITH DIFFERENTIAL/PLATELET
Abs Immature Granulocytes: 0.04 10*3/uL (ref 0.00–0.07)
Basophils Absolute: 0 10*3/uL (ref 0.0–0.1)
Basophils Relative: 0 %
Eosinophils Absolute: 0.1 10*3/uL (ref 0.0–0.5)
Eosinophils Relative: 1 %
HCT: 36.9 % — ABNORMAL LOW (ref 39.0–52.0)
Hemoglobin: 11.8 g/dL — ABNORMAL LOW (ref 13.0–17.0)
Immature Granulocytes: 0 %
Lymphocytes Relative: 16 %
Lymphs Abs: 1.8 10*3/uL (ref 0.7–4.0)
MCH: 31.5 pg (ref 26.0–34.0)
MCHC: 32 g/dL (ref 30.0–36.0)
MCV: 98.4 fL (ref 80.0–100.0)
Monocytes Absolute: 0.9 10*3/uL (ref 0.1–1.0)
Monocytes Relative: 8 %
Neutro Abs: 8.6 10*3/uL — ABNORMAL HIGH (ref 1.7–7.7)
Neutrophils Relative %: 75 %
Platelets: 209 10*3/uL (ref 150–400)
RBC: 3.75 MIL/uL — ABNORMAL LOW (ref 4.22–5.81)
RDW: 13.5 % (ref 11.5–15.5)
WBC: 11.4 10*3/uL — ABNORMAL HIGH (ref 4.0–10.5)
nRBC: 0 % (ref 0.0–0.2)

## 2018-11-27 LAB — COMPREHENSIVE METABOLIC PANEL
ALT: 8 U/L (ref 0–44)
AST: 15 U/L (ref 15–41)
Albumin: 3.5 g/dL (ref 3.5–5.0)
Alkaline Phosphatase: 64 U/L (ref 38–126)
Anion gap: 5 (ref 5–15)
BUN: 35 mg/dL — ABNORMAL HIGH (ref 8–23)
CO2: 29 mmol/L (ref 22–32)
Calcium: 8.8 mg/dL — ABNORMAL LOW (ref 8.9–10.3)
Chloride: 103 mmol/L (ref 98–111)
Creatinine, Ser: 1.55 mg/dL — ABNORMAL HIGH (ref 0.61–1.24)
GFR calc Af Amer: 47 mL/min — ABNORMAL LOW (ref >60)
GFR calc non Af Amer: 41 mL/min — ABNORMAL LOW (ref >60)
Glucose, Bld: 126 mg/dL — ABNORMAL HIGH (ref 70–99)
Potassium: 5.3 mmol/L — ABNORMAL HIGH (ref 3.5–5.1)
Sodium: 137 mmol/L (ref 135–145)
Total Bilirubin: 0.4 mg/dL (ref 0.3–1.2)
Total Protein: 6.3 g/dL — ABNORMAL LOW (ref 6.5–8.1)

## 2018-11-27 LAB — PROTIME-INR
INR: 0.9 (ref 0.8–1.2)
Prothrombin Time: 12.3 seconds (ref 11.4–15.2)

## 2018-11-27 LAB — TYPE AND SCREEN
ABO/RH(D): A POS
Antibody Screen: NEGATIVE

## 2018-11-27 LAB — LIPASE, BLOOD: Lipase: 36 U/L (ref 11–51)

## 2018-11-27 LAB — SARS CORONAVIRUS 2 BY RT PCR (HOSPITAL ORDER, PERFORMED IN ~~LOC~~ HOSPITAL LAB): SARS Coronavirus 2: NEGATIVE

## 2018-11-27 MED ORDER — TETANUS-DIPHTH-ACELL PERTUSSIS 5-2.5-18.5 LF-MCG/0.5 IM SUSP
0.5000 mL | Freq: Once | INTRAMUSCULAR | Status: AC
  Administered 2018-11-27: 0.5 mL via INTRAMUSCULAR
  Filled 2018-11-27: qty 0.5

## 2018-11-27 MED ORDER — IOHEXOL 300 MG/ML  SOLN
75.0000 mL | Freq: Once | INTRAMUSCULAR | Status: AC | PRN
  Administered 2018-11-27: 21:00:00 75 mL via INTRAVENOUS

## 2018-11-27 MED ORDER — HYDROCODONE-ACETAMINOPHEN 5-325 MG PO TABS
1.0000 | ORAL_TABLET | ORAL | Status: AC
  Administered 2018-11-27: 19:00:00 1 via ORAL
  Filled 2018-11-27: qty 1

## 2018-11-27 NOTE — ED Notes (Signed)
phlebotomy at bedside to redraw type and screen

## 2018-11-27 NOTE — ED Triage Notes (Addendum)
Pt arrives to ed via POV from home. Pt reports falling off 2' tall deck when working on it. Denies hitting head or any LOC. Rt back/ side pain Pt alert and ambulatory with not acute distress at this time. Pt also report skin tears on both arms. Currently wrapped with coband. No bleeding noted.

## 2018-11-27 NOTE — ED Notes (Signed)
EMTALA reviewed by charge RN 

## 2018-11-27 NOTE — ED Notes (Signed)
Pt placed on 2l [place] for comfort, sats 94%

## 2018-11-27 NOTE — ED Notes (Signed)
Pt has skin tears bilaterally on arms; pt c/o rib pain on R side; pt resp baseline for pt; pt denies hitting head and hip or back pain.

## 2018-11-27 NOTE — ED Provider Notes (Signed)
[facility] EMERGENCY DEPARTMENT Provider Note   CSN: [id number] Arrival date & time: 11/27/18  1613    History   Chief Complaint Chief Complaint  Patient presents with  . Back Pain  . Fall    HPI Micheal [name] is a 85 y.o. male with history of COPD, hypertension, diabetes presents to the emergency department for evaluation of right rib pain.  Patient states he fell 2 days ago onto his right ribs as he was on his porch, 2 feet high.  He developed some right rib pain and has had persistent sharp pain with no relief for last couple days.  He has pain with taking a deep breath but no chest pain or shortness of breath.  He denies any headache, LOC, nausea or vomiting.  No neck pain, thoracic, lumbar discomfort.  No hip or lower extremity discomfort.  He did suffer some abrasions to both upper extremities along the right posterior forearm and left posterior elbow.  Abrasions have been cleansed and dressings applied by wife.  His tetanus is not up-to-date.  Patient has taken 2 aspirin tablets of the last couple days with little relief.  He is not taking of medications for pain.  His pain is moderate to severe and he points to the right distal anterior ribs.  Patient ambulatory with no assistive devices.     HPI  Past Medical History:  Diagnosis Date  . Balance problem    INTERMITTENT WITH NEGATIVE WORKUP  . COPD (chronic obstructive pulmonary disease) ([place])   . Coronary artery disease   . Diabetes mellitus without complication ([place])   . GERD (gastroesophageal reflux disease)   . Hypercholesteremia   . Hypertension   . Myocardial infarction ([place])    2005  . Pneumonia    RECENT 5/17  . Shortness of breath dyspnea   . Wheezing     Patient Active Problem List   Diagnosis Date Noted  . Bimalleolar ankle fracture, left, closed, initial encounter 01/09/2018  . Respiratory failure with hypoxia and hypercapnia ([place]) 08/19/2017  . Community acquired pneumonia of  left lower lobe of lung ([place])   . Controlled diabetes mellitus type 2 with complications ([place]) 25/00/3704  . Gastroesophageal reflux disease without esophagitis 05/26/2015  . Alcohol abuse 01/01/2013  . COPD (chronic obstructive pulmonary disease) ([place]) 10/22/2012  . HTN (hypertension) 10/22/2012    Past Surgical History:  Procedure Laterality Date  . APPENDECTOMY    . CARDIAC CATHETERIZATION    . CATARACT EXTRACTION W/PHACO Right 10/04/2015   Procedure: CATARACT EXTRACTION PHACO AND INTRAOCULAR LENS PLACEMENT (IOC);  Surgeon: [name], MD;  Location: [facility] ORS;  Service: Ophthalmology;  Laterality: Right;  [place] 02:06 AP% 17.9 CDE 22.65 fluid pack lot # [id number]  . CATARACT EXTRACTION W/PHACO Left 12/13/2015   Procedure: CATARACT EXTRACTION PHACO AND INTRAOCULAR LENS PLACEMENT ([place]);  Surgeon: [name], MD;  Location: [facility] ORS;  Service: Ophthalmology;  Laterality: Left;  [place] 1.39 AP% 25.3 CDE 26.37 Fluid pack lot # [id number]  . COLONOSCOPY    . CORONARY ANGIOPLASTY     Stent placement  . ORIF ANKLE FRACTURE Left 01/10/2018   Procedure: OPEN REDUCTION INTERNAL FIXATION (ORIF) ANKLE FRACTURE;  Surgeon: Dereck Leep, MD;  Location: [facility] ORS;  Service: Orthopedics;  Laterality: Left;        Home Medications    Prior to Admission medications   Medication Sig Start Date End Date Taking? Authorizing Provider  albuterol (PROVENTIL HFA;VENTOLIN HFA) 108 (90 Base) MCG/ACT  inhaler Inhale 2 puffs into the lungs every 6 (six) hours as needed for wheezing or shortness of breath. 08/21/17   [name], MD  atorvastatin (LIPITOR) 40 MG tablet Take 1 tablet by mouth daily.     [provider]  carvedilol (COREG) 25 MG tablet Take 25 mg by mouth 2 (two) times daily with a meal.     [provider]  enoxaparin (LOVENOX) 40 MG/0.4ML injection Inject 0.4 mLs (40 mg total) into the skin daily. 01/12/18   Reche Dixon, PA-C  Fluticasone-Salmeterol (ADVAIR DISKUS)  100-50 MCG/DOSE AEPB Inhale 1 puff into the lungs 2 (two) times daily. 08/21/17 08/21/18  [name], MD  glipiZIDE (GLUCOTROL XL) 2.5 MG 24 hr tablet Take 2.5 mg by mouth daily with breakfast.    [provider]  lisinopril (PRINIVIL,ZESTRIL) 10 MG tablet Take 10 mg by mouth daily.    [provider]  metFORMIN (GLUCOPHAGE) 500 MG tablet Take 2 tablets (1000MG ) by mouth every morning and 1 tablet (500MG ) by mouth every evening    [provider]  oxyCODONE (OXY IR/ROXICODONE) 5 MG immediate release tablet Take 1 tablet (5 mg total) by mouth every 4 (four) hours as needed for moderate pain (pain score 4-6). 01/12/18   Reche Dixon, PA-C  tiotropium (SPIRIVA HANDIHALER) 18 MCG inhalation capsule Place 1 capsule (18 mcg total) into inhaler and inhale daily. 08/21/17 08/21/18  [name], MD  traMADol (ULTRAM) 50 MG tablet Take 1-2 tablets (50-100 mg total) by mouth every 4 (four) hours as needed for moderate pain. 01/12/18   Reche Dixon, PA-C    Family History History reviewed. No pertinent family history.  Social History Social History   Tobacco Use  . Smoking status: Former Research scientist (life sciences)  . Smokeless tobacco: Former Network engineer Use Topics  . Alcohol use: No    Comment: OCCAS  . Drug use: Not on file     Allergies   Patient has no known allergies.   Review of Systems Review of Systems  Constitutional: Negative for fatigue.  Respiratory: Negative for shortness of breath.   Cardiovascular: Negative for chest pain.  Gastrointestinal: Negative for nausea and vomiting.  Musculoskeletal: Positive for arthralgias and myalgias. Negative for back pain, gait problem, joint swelling, neck pain and neck stiffness.  Skin: Negative for rash.  Neurological: Negative for dizziness, tremors, seizures, weakness, light-headedness and headaches.     Physical Exam Updated Vital Signs BP (!) 126/59 (BP Location: Left Arm)   Pulse 97   Temp 98.5 F (36.9 C)  (Oral)   Resp 18   Ht 5\' 7"  (1.702 m)   Wt 63 kg   SpO2 100%   BMI 21.77 kg/m   Physical Exam Constitutional:      Appearance: He is well-developed.  HENT:     Head: Normocephalic and atraumatic.  Eyes:     Conjunctiva/sclera: Conjunctivae normal.  Neck:     Musculoskeletal: Normal range of motion.  Cardiovascular:     Rate and Rhythm: Normal rate.  Pulmonary:     Effort: Pulmonary effort is normal. No respiratory distress.     Breath sounds: No wheezing or rales.     Comments: Slight decrease and respiratory sounds right upper lobe. Chest:     Chest wall: No tenderness.  Abdominal:     General: There is no distension.     Tenderness: There is no abdominal tenderness.  Musculoskeletal: Normal range of motion.     Comments: No cervical thoracic or lumbar  spinous process tenderness.  Good range of motion both hips with no discomfort.  Patient nontender along the pelvis.  Patient tender along the right mid lower rib region with no step-off.  No ecchymosis.  Small superficial abrasions to the right posterior forearm and left posterior elbow.  Skin:    General: Skin is warm.     Findings: No rash.  Neurological:     General: No focal deficit present.     Mental Status: He is alert and oriented to person, place, and time. Mental status is at baseline.     Cranial Nerves: No cranial nerve deficit.  Psychiatric:        Behavior: Behavior normal.        Thought Content: Thought content normal.        Judgment: Judgment normal.      ED Treatments / Results  Labs (all labs ordered are listed, but only abnormal results are displayed) Labs Reviewed  SARS CORONAVIRUS 2 (HOSPITAL ORDER, [facility])  CBC WITH DIFFERENTIAL/PLATELET  COMPREHENSIVE METABOLIC PANEL  LIPASE, BLOOD  PROTIME-INR  URINALYSIS, COMPLETE (UACMP) WITH MICROSCOPIC  TYPE AND SCREEN    EKG None  Radiology Dg Ribs Unilateral W/chest Right  Result Date: 11/27/2018 CLINICAL  DATA:  Right rib pain after a fall. EXAM: RIGHT RIBS AND CHEST - 3+ VIEW COMPARISON:  Chest x-ray 01/09/2018 FINDINGS: Small to moderate right pneumothorax noted with tiny right pleural effusion. Left lung clear. The cardiopericardial silhouette is within normal limits for size. Oblique views of the right ribs show acute fractures of the anterolateral ninth and tenth ribs. Subcutaneous emphysema is noted in the soft tissues of the overlying chest wall. IMPRESSION: 1. Acute fractures of the anterolateral right ninth and tenth ribs with evidence of a small to moderate pneumothorax and associated subcutaneous emphysema. Critical Value/emergent results were called by telephone at the time of interpretation on 11/27/2018 at 7:26 pm to the patient's provide, Gerald Stabs, who verbally acknowledged these results. Electronically Signed   By: Misty Stanley M.D.   On: 11/27/2018 19:26    Procedures Procedures (including critical care time)  Medications Ordered in ED Medications  HYDROcodone-acetaminophen (NORCO/VICODIN) 5-325 MG per tablet 1 tablet (1 tablet Oral Given 11/27/18 1905)  Tdap (BOOSTRIX) injection 0.5 mL (0.5 mLs Intramuscular Given 11/27/18 1906)     Initial Impression / Assessment and Plan / ED Course  I have reviewed the triage vital signs and the nursing notes.  Pertinent labs & imaging results that were available during my care of the patient were reviewed by me and considered in my medical decision making (see chart for details).        85 year old male with mild to moderate size right pneumothorax with right ninth and 10th rib fracture.  Patient's vital signs are stable, O2 sats 100%, respirations pulse rate within normal limits.  No complaints of shortness of breath.  Pain well controlled.  Discussed case with attending physician will transfer patient over to the major side of the emergency department where they can be monitored and further test performed.  Case discussed with Dr. Cherylann Banas.   Final Clinical Impressions(s) / ED Diagnoses   Final diagnoses:  Closed fracture of multiple ribs of right side, initial encounter  Traumatic pneumothorax, initial encounter    ED Discharge Orders    None       Renata Caprice 11/27/18 Tyson Alias, MD 11/30/18 [phone]

## 2018-11-27 NOTE — ED Provider Notes (Signed)
-----------------------------------------   10:58 PM on 11/27/2018 -----------------------------------------  Patient was initially seen by PA Gaines in the flex area and discussed with Dr. Ellender Hose.  Subsequently patient was moved to the B side of the ED under my care.  Patient had a fall a few days ago with persistent right sided pain and at the time of transfer to my care, had an x-ray demonstrating a small pneumothorax and was pending CTs for further evaluation of his injuries.  CT reveals a small to moderate pneumothorax on the right.  Reviewing the images, the maximal size is less than 2 cm at the apex and it is all anterior.  Especially given that the patient has an O2 saturation in the mid 90s, no increased work of breathing, and the pneumothorax is all anterior, I do not think a chest tube would be appropriate emergently.  CT also reveals 3 contiguous rib fractures on the right.    The patient will require admission for pain control and monitoring.  I consulted Dr. Peyton Najjar from general surgery who recommended that the patient be transferred to a trauma center.  I contacted the [facility] transfer center.  The patient is accepted by Dr. Rivka Barbara for transfer ED to ED.  At this time, the patient is hemodynamically stable and is appropriate for transfer.  He agrees with the plan.  In addition to the traumatic findings above, the CT showed a left renal cyst versus mass which will need work-up to rule out a malignancy.  I informed the patient about this and he expressed understanding.   Arta Silence, MD 11/27/18 [id number]

## 2018-11-27 NOTE — ED Notes (Signed)
Patient transported to CT 

## 2018-12-01 DIAGNOSIS — J90 Pleural effusion, not elsewhere classified: Secondary | ICD-10-CM | POA: Insufficient documentation

## 2018-12-01 DIAGNOSIS — R338 Other retention of urine: Secondary | ICD-10-CM | POA: Insufficient documentation

## 2019-01-10 ENCOUNTER — Other Ambulatory Visit: Payer: Self-pay

## 2019-01-10 ENCOUNTER — Emergency Department: Payer: Medicare Other

## 2019-01-10 ENCOUNTER — Encounter: Payer: Self-pay | Admitting: Emergency Medicine

## 2019-01-10 ENCOUNTER — Emergency Department
Admission: EM | Admit: 2019-01-10 | Discharge: 2019-01-10 | Disposition: A | Discharge status: Home / Self Care | Payer: Medicare Other | Attending: Emergency Medicine | Admitting: Emergency Medicine

## 2019-01-10 DIAGNOSIS — Y9389 Activity, other specified: Secondary | ICD-10-CM | POA: Diagnosis not present

## 2019-01-10 DIAGNOSIS — Z79899 Other long term (current) drug therapy: Secondary | ICD-10-CM | POA: Diagnosis not present

## 2019-01-10 DIAGNOSIS — S61012A Laceration without foreign body of left thumb without damage to nail, initial encounter: Secondary | ICD-10-CM | POA: Insufficient documentation

## 2019-01-10 DIAGNOSIS — Y999 Unspecified external cause status: Secondary | ICD-10-CM | POA: Insufficient documentation

## 2019-01-10 DIAGNOSIS — M25511 Pain in right shoulder: Secondary | ICD-10-CM | POA: Insufficient documentation

## 2019-01-10 DIAGNOSIS — Z87891 Personal history of nicotine dependence: Secondary | ICD-10-CM | POA: Diagnosis not present

## 2019-01-10 DIAGNOSIS — Z7982 Long term (current) use of aspirin: Secondary | ICD-10-CM | POA: Insufficient documentation

## 2019-01-10 DIAGNOSIS — Z955 Presence of coronary angioplasty implant and graft: Secondary | ICD-10-CM | POA: Insufficient documentation

## 2019-01-10 DIAGNOSIS — I2510 Atherosclerotic heart disease of native coronary artery without angina pectoris: Secondary | ICD-10-CM | POA: Diagnosis not present

## 2019-01-10 DIAGNOSIS — J449 Chronic obstructive pulmonary disease, unspecified: Secondary | ICD-10-CM | POA: Insufficient documentation

## 2019-01-10 DIAGNOSIS — Y9289 Other specified places as the place of occurrence of the external cause: Secondary | ICD-10-CM | POA: Diagnosis not present

## 2019-01-10 DIAGNOSIS — S52572A Other intraarticular fracture of lower end of left radius, initial encounter for closed fracture: Secondary | ICD-10-CM | POA: Insufficient documentation

## 2019-01-10 DIAGNOSIS — Z7984 Long term (current) use of oral hypoglycemic drugs: Secondary | ICD-10-CM | POA: Diagnosis not present

## 2019-01-10 DIAGNOSIS — I10 Essential (primary) hypertension: Secondary | ICD-10-CM | POA: Insufficient documentation

## 2019-01-10 DIAGNOSIS — S62102A Fracture of unspecified carpal bone, left wrist, initial encounter for closed fracture: Secondary | ICD-10-CM

## 2019-01-10 DIAGNOSIS — I252 Old myocardial infarction: Secondary | ICD-10-CM | POA: Diagnosis not present

## 2019-01-10 MED ORDER — TRAMADOL HCL 50 MG PO TABS: 50.0000 mg | ORAL_TABLET | Freq: Two times a day (BID) | ORAL | 0 refills | Status: DC | PRN
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
## 2019-01-10 NOTE — Discharge Instructions (Addendum)
Wear splint and follow discharge care instructions.  Follow-up orthopedics as directed.
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
## 2019-03-25 ENCOUNTER — Other Ambulatory Visit: Payer: Self-pay

## 2019-03-25 ENCOUNTER — Encounter: Payer: Self-pay | Admitting: Emergency Medicine

## 2019-03-25 ENCOUNTER — Inpatient Hospital Stay: Payer: Medicare Other

## 2019-03-25 ENCOUNTER — Emergency Department: Payer: Medicare Other

## 2019-03-25 ENCOUNTER — Inpatient Hospital Stay
Admission: EM | Admit: 2019-03-25 | Discharge: 2019-04-15 | DRG: 335 | Disposition: A | Discharge status: Home / Self Care | Payer: Medicare Other | Attending: Surgery | Admitting: Surgery

## 2019-03-25 DIAGNOSIS — Z7951 Long term (current) use of inhaled steroids: Secondary | ICD-10-CM

## 2019-03-25 DIAGNOSIS — J449 Chronic obstructive pulmonary disease, unspecified: Secondary | ICD-10-CM | POA: Diagnosis present

## 2019-03-25 DIAGNOSIS — I252 Old myocardial infarction: Secondary | ICD-10-CM

## 2019-03-25 DIAGNOSIS — J90 Pleural effusion, not elsewhere classified: Secondary | ICD-10-CM | POA: Diagnosis not present

## 2019-03-25 DIAGNOSIS — K567 Ileus, unspecified: Secondary | ICD-10-CM

## 2019-03-25 DIAGNOSIS — K56609 Unspecified intestinal obstruction, unspecified as to partial versus complete obstruction: Secondary | ICD-10-CM | POA: Diagnosis present

## 2019-03-25 DIAGNOSIS — Z20828 Contact with and (suspected) exposure to other viral communicable diseases: Secondary | ICD-10-CM | POA: Diagnosis present

## 2019-03-25 DIAGNOSIS — S2231XA Fracture of one rib, right side, initial encounter for closed fracture: Secondary | ICD-10-CM

## 2019-03-25 DIAGNOSIS — E875 Hyperkalemia: Secondary | ICD-10-CM | POA: Diagnosis not present

## 2019-03-25 DIAGNOSIS — E785 Hyperlipidemia, unspecified: Secondary | ICD-10-CM | POA: Diagnosis present

## 2019-03-25 DIAGNOSIS — K565 Intestinal adhesions [bands], unspecified as to partial versus complete obstruction: Secondary | ICD-10-CM | POA: Diagnosis present

## 2019-03-25 DIAGNOSIS — N390 Urinary tract infection, site not specified: Secondary | ICD-10-CM | POA: Diagnosis not present

## 2019-03-25 DIAGNOSIS — Z4659 Encounter for fitting and adjustment of other gastrointestinal appliance and device: Secondary | ICD-10-CM

## 2019-03-25 DIAGNOSIS — I10 Essential (primary) hypertension: Secondary | ICD-10-CM | POA: Diagnosis not present

## 2019-03-25 DIAGNOSIS — I129 Hypertensive chronic kidney disease with stage 1 through stage 4 chronic kidney disease, or unspecified chronic kidney disease: Secondary | ICD-10-CM | POA: Diagnosis present

## 2019-03-25 DIAGNOSIS — E1165 Type 2 diabetes mellitus with hyperglycemia: Secondary | ICD-10-CM | POA: Diagnosis present

## 2019-03-25 DIAGNOSIS — D72829 Elevated white blood cell count, unspecified: Secondary | ICD-10-CM | POA: Diagnosis not present

## 2019-03-25 DIAGNOSIS — N179 Acute kidney failure, unspecified: Secondary | ICD-10-CM

## 2019-03-25 DIAGNOSIS — K219 Gastro-esophageal reflux disease without esophagitis: Secondary | ICD-10-CM | POA: Diagnosis present

## 2019-03-25 DIAGNOSIS — E1129 Type 2 diabetes mellitus with other diabetic kidney complication: Secondary | ICD-10-CM | POA: Diagnosis not present

## 2019-03-25 DIAGNOSIS — E1122 Type 2 diabetes mellitus with diabetic chronic kidney disease: Secondary | ICD-10-CM | POA: Diagnosis present

## 2019-03-25 DIAGNOSIS — E860 Dehydration: Secondary | ICD-10-CM | POA: Diagnosis present

## 2019-03-25 DIAGNOSIS — Z955 Presence of coronary angioplasty implant and graft: Secondary | ICD-10-CM

## 2019-03-25 DIAGNOSIS — F17210 Nicotine dependence, cigarettes, uncomplicated: Secondary | ICD-10-CM | POA: Diagnosis present

## 2019-03-25 DIAGNOSIS — F17200 Nicotine dependence, unspecified, uncomplicated: Secondary | ICD-10-CM | POA: Diagnosis not present

## 2019-03-25 DIAGNOSIS — B029 Zoster without complications: Secondary | ICD-10-CM | POA: Diagnosis present

## 2019-03-25 DIAGNOSIS — K9189 Other postprocedural complications and disorders of digestive system: Secondary | ICD-10-CM | POA: Diagnosis not present

## 2019-03-25 DIAGNOSIS — J9601 Acute respiratory failure with hypoxia: Secondary | ICD-10-CM | POA: Diagnosis not present

## 2019-03-25 DIAGNOSIS — IMO0002 Reserved for concepts with insufficient information to code with codable children: Secondary | ICD-10-CM | POA: Diagnosis present

## 2019-03-25 DIAGNOSIS — K560 Paralytic ileus: Secondary | ICD-10-CM | POA: Diagnosis not present

## 2019-03-25 DIAGNOSIS — Z79899 Other long term (current) drug therapy: Secondary | ICD-10-CM

## 2019-03-25 DIAGNOSIS — Z7984 Long term (current) use of oral hypoglycemic drugs: Secondary | ICD-10-CM

## 2019-03-25 DIAGNOSIS — J690 Pneumonitis due to inhalation of food and vomit: Secondary | ICD-10-CM | POA: Diagnosis not present

## 2019-03-25 DIAGNOSIS — Z7982 Long term (current) use of aspirin: Secondary | ICD-10-CM

## 2019-03-25 DIAGNOSIS — I2510 Atherosclerotic heart disease of native coronary artery without angina pectoris: Secondary | ICD-10-CM | POA: Diagnosis present

## 2019-03-25 DIAGNOSIS — R5381 Other malaise: Secondary | ICD-10-CM | POA: Diagnosis present

## 2019-03-25 DIAGNOSIS — Z79891 Long term (current) use of opiate analgesic: Secondary | ICD-10-CM

## 2019-03-25 DIAGNOSIS — Z978 Presence of other specified devices: Secondary | ICD-10-CM

## 2019-03-25 DIAGNOSIS — Z716 Tobacco abuse counseling: Secondary | ICD-10-CM

## 2019-03-25 DIAGNOSIS — R000 Tachycardia, unspecified: Secondary | ICD-10-CM | POA: Diagnosis not present

## 2019-03-25 DIAGNOSIS — N1832 Chronic kidney disease, stage 3b: Secondary | ICD-10-CM | POA: Diagnosis present

## 2019-03-25 DIAGNOSIS — E871 Hypo-osmolality and hyponatremia: Secondary | ICD-10-CM | POA: Diagnosis not present

## 2019-03-25 DIAGNOSIS — I700 Atherosclerosis of aorta: Secondary | ICD-10-CM | POA: Diagnosis present

## 2019-03-25 DIAGNOSIS — J189 Pneumonia, unspecified organism: Secondary | ICD-10-CM

## 2019-03-25 DIAGNOSIS — N400 Enlarged prostate without lower urinary tract symptoms: Secondary | ICD-10-CM | POA: Diagnosis present

## 2019-03-25 DIAGNOSIS — R0902 Hypoxemia: Secondary | ICD-10-CM

## 2019-03-25 LAB — COMPREHENSIVE METABOLIC PANEL
ALT: 10 U/L (ref 0–44)
AST: 23 U/L (ref 15–41)
Albumin: 4 g/dL (ref 3.5–5.0)
Alkaline Phosphatase: 79 U/L (ref 38–126)
Anion gap: 17 — ABNORMAL HIGH (ref 5–15)
BUN: 57 mg/dL — ABNORMAL HIGH (ref 8–23)
CO2: 24 mmol/L (ref 22–32)
Calcium: 9.2 mg/dL (ref 8.9–10.3)
Chloride: 88 mmol/L — ABNORMAL LOW (ref 98–111)
Creatinine, Ser: 2.14 mg/dL — ABNORMAL HIGH (ref 0.61–1.24)
GFR calc Af Amer: 32 mL/min — ABNORMAL LOW (ref >60)
GFR calc non Af Amer: 27 mL/min — ABNORMAL LOW (ref >60)
Glucose, Bld: 366 mg/dL — ABNORMAL HIGH (ref 70–99)
Potassium: 4.2 mmol/L (ref 3.5–5.1)
Sodium: 129 mmol/L — ABNORMAL LOW (ref 135–145)
Total Bilirubin: 1 mg/dL (ref 0.3–1.2)
Total Protein: 7.3 g/dL (ref 6.5–8.1)

## 2019-03-25 LAB — SARS CORONAVIRUS 2 BY RT PCR (HOSPITAL ORDER, PERFORMED IN ~~LOC~~ HOSPITAL LAB): SARS Coronavirus 2: NEGATIVE

## 2019-03-25 LAB — CBC
HCT: 38.7 % — ABNORMAL LOW (ref 39.0–52.0)
Hemoglobin: 13 g/dL (ref 13.0–17.0)
MCH: 30.2 pg (ref 26.0–34.0)
MCHC: 33.6 g/dL (ref 30.0–36.0)
MCV: 89.8 fL (ref 80.0–100.0)
Platelets: 295 10*3/uL (ref 150–400)
RBC: 4.31 MIL/uL (ref 4.22–5.81)
RDW: 14.1 % (ref 11.5–15.5)
WBC: 27.9 10*3/uL — ABNORMAL HIGH (ref 4.0–10.5)
nRBC: 0 % (ref 0.0–0.2)

## 2019-03-25 LAB — GLUCOSE, CAPILLARY
Glucose-Capillary: 104 mg/dL — ABNORMAL HIGH (ref 70–99)
Glucose-Capillary: 103 mg/dL — ABNORMAL HIGH (ref 70–99)

## 2019-03-25 LAB — LACTIC ACID, PLASMA: Lactic Acid, Venous: 2.1 mmol/L (ref 0.5–1.9)

## 2019-03-25 LAB — LIPASE, BLOOD: Lipase: 14 U/L (ref 11–51)

## 2019-03-25 MED ORDER — PANTOPRAZOLE SODIUM 40 MG IV SOLR
40.0000 mg | Freq: Every day | INTRAVENOUS | Status: DC
  Administered 2019-03-25 – 2019-04-12 (×19): 40 mg via INTRAVENOUS
  Filled 2019-03-25 (×19): qty 40

## 2019-03-25 MED ORDER — ONDANSETRON 4 MG PO TBDP
ORAL_TABLET | ORAL | Status: AC
  Filled 2019-03-25: qty 1

## 2019-03-25 MED ORDER — INSULIN ASPART 100 UNIT/ML ~~LOC~~ SOLN
4.0000 [IU] | Freq: Once | SUBCUTANEOUS | Status: AC
  Administered 2019-03-25: 4 [IU] via INTRAVENOUS
  Filled 2019-03-25: qty 1

## 2019-03-25 MED ORDER — ONDANSETRON HCL 4 MG/2ML IJ SOLN
4.0000 mg | Freq: Four times a day (QID) | INTRAMUSCULAR | Status: DC | PRN
  Administered 2019-03-26 – 2019-04-12 (×2): 4 mg via INTRAVENOUS
  Filled 2019-03-25 (×2): qty 2

## 2019-03-25 MED ORDER — HEPARIN SODIUM (PORCINE) 5000 UNIT/ML IJ SOLN
5000.0000 [IU] | Freq: Three times a day (TID) | INTRAMUSCULAR | Status: DC
  Administered 2019-03-25 – 2019-04-02 (×21): 5000 [IU] via SUBCUTANEOUS
  Filled 2019-03-25 (×21): qty 1

## 2019-03-25 MED ORDER — ONDANSETRON 4 MG PO TBDP
4.0000 mg | ORAL_TABLET | Freq: Once | ORAL | Status: AC | PRN
  Administered 2019-03-25: 4 mg via ORAL

## 2019-03-25 MED ORDER — METOPROLOL TARTRATE 5 MG/5ML IV SOLN
5.0000 mg | Freq: Four times a day (QID) | INTRAVENOUS | Status: DC
  Administered 2019-03-25 – 2019-04-13 (×69): 5 mg via INTRAVENOUS
  Filled 2019-03-25 (×74): qty 5

## 2019-03-25 MED ORDER — MORPHINE SULFATE (PF) 2 MG/ML IV SOLN
2.0000 mg | INTRAVENOUS | Status: DC | PRN
  Administered 2019-03-25 – 2019-03-26 (×3): 2 mg via INTRAVENOUS
  Filled 2019-03-25 (×3): qty 1

## 2019-03-25 MED ORDER — SODIUM CHLORIDE 0.9 % IV BOLUS
500.0000 mL | Freq: Once | INTRAVENOUS | Status: AC
  Administered 2019-03-25: 500 mL via INTRAVENOUS

## 2019-03-25 MED ORDER — ALBUTEROL SULFATE (2.5 MG/3ML) 0.083% IN NEBU
2.5000 mg | INHALATION_SOLUTION | Freq: Four times a day (QID) | RESPIRATORY_TRACT | Status: DC | PRN
  Administered 2019-04-12: 2.5 mg via RESPIRATORY_TRACT
  Filled 2019-03-25: qty 3

## 2019-03-25 MED ORDER — SODIUM CHLORIDE 0.9% FLUSH
3.0000 mL | Freq: Once | INTRAVENOUS | Status: AC
  Administered 2019-03-25: 3 mL via INTRAVENOUS

## 2019-03-25 MED ORDER — LACTATED RINGERS IV SOLN
INTRAVENOUS | Status: DC
  Administered 2019-03-25 – 2019-04-01 (×13): via INTRAVENOUS

## 2019-03-25 MED ORDER — MOMETASONE FURO-FORMOTEROL FUM 100-5 MCG/ACT IN AERO
2.0000 | INHALATION_SPRAY | Freq: Two times a day (BID) | RESPIRATORY_TRACT | Status: DC
  Administered 2019-03-25 – 2019-04-15 (×41): 2 via RESPIRATORY_TRACT
  Filled 2019-03-25 (×2): qty 8.8

## 2019-03-25 MED ORDER — ONDANSETRON 4 MG PO TBDP
4.0000 mg | ORAL_TABLET | Freq: Four times a day (QID) | ORAL | Status: DC | PRN
  Filled 2019-03-25: qty 1

## 2019-03-25 MED ORDER — HYDROMORPHONE HCL 1 MG/ML IJ SOLN: 0.5000 mg | INTRAMUSCULAR | Status: DC | PRN

## 2019-03-25 MED ORDER — INSULIN ASPART 100 UNIT/ML ~~LOC~~ SOLN
0.0000 [IU] | SUBCUTANEOUS | Status: DC
  Administered 2019-03-26: 3 [IU] via SUBCUTANEOUS
  Administered 2019-03-26: 2 [IU] via SUBCUTANEOUS
  Administered 2019-03-27 (×2): 3 [IU] via SUBCUTANEOUS
  Administered 2019-03-27: 1 [IU] via SUBCUTANEOUS
  Administered 2019-03-28 – 2019-03-29 (×3): 2 [IU] via SUBCUTANEOUS
  Administered 2019-03-30: 5 [IU] via SUBCUTANEOUS
  Administered 2019-03-30: 2 [IU] via SUBCUTANEOUS
  Administered 2019-03-31: 5 [IU] via SUBCUTANEOUS
  Administered 2019-03-31 – 2019-04-01 (×5): 3 [IU] via SUBCUTANEOUS
  Administered 2019-04-01 (×2): 8 [IU] via SUBCUTANEOUS
  Administered 2019-04-01: 2 [IU] via SUBCUTANEOUS
  Administered 2019-04-01: 8 [IU] via SUBCUTANEOUS
  Administered 2019-04-02 (×3): 3 [IU] via SUBCUTANEOUS
  Administered 2019-04-02 (×2): 5 [IU] via SUBCUTANEOUS
  Administered 2019-04-03 – 2019-04-06 (×18): 3 [IU] via SUBCUTANEOUS
  Administered 2019-04-06: 2 [IU] via SUBCUTANEOUS
  Administered 2019-04-06: 3 [IU] via SUBCUTANEOUS
  Administered 2019-04-06: 2 [IU] via SUBCUTANEOUS
  Administered 2019-04-06: 3 [IU] via SUBCUTANEOUS
  Administered 2019-04-07: 2 [IU] via SUBCUTANEOUS
  Administered 2019-04-07 (×5): 3 [IU] via SUBCUTANEOUS
  Administered 2019-04-08 (×2): 2 [IU] via SUBCUTANEOUS
  Administered 2019-04-08 (×4): 3 [IU] via SUBCUTANEOUS
  Administered 2019-04-09: 0 [IU] via SUBCUTANEOUS
  Administered 2019-04-09: 3 [IU] via SUBCUTANEOUS
  Administered 2019-04-09: 8 [IU] via SUBCUTANEOUS
  Administered 2019-04-09 – 2019-04-10 (×2): 5 [IU] via SUBCUTANEOUS
  Administered 2019-04-10: 3 [IU] via SUBCUTANEOUS
  Administered 2019-04-10: 2 [IU] via SUBCUTANEOUS
  Administered 2019-04-10: 3 [IU] via SUBCUTANEOUS
  Administered 2019-04-10: 2 [IU] via SUBCUTANEOUS
  Administered 2019-04-11 (×3): 5 [IU] via SUBCUTANEOUS
  Administered 2019-04-11 – 2019-04-12 (×2): 3 [IU] via SUBCUTANEOUS
  Administered 2019-04-12: 5 [IU] via SUBCUTANEOUS
  Administered 2019-04-12: 3 [IU] via SUBCUTANEOUS
  Administered 2019-04-12: 5 [IU] via SUBCUTANEOUS
  Administered 2019-04-12: 3 [IU] via SUBCUTANEOUS
  Filled 2019-03-25 (×74): qty 1

## 2019-03-25 MED ORDER — SODIUM CHLORIDE 0.9 % IV SOLN
Freq: Once | INTRAVENOUS | Status: AC
  Administered 2019-03-25: 17:00:00 via INTRAVENOUS

## 2019-03-25 MED ORDER — KETOROLAC TROMETHAMINE 30 MG/ML IJ SOLN: 15.0000 mg | Freq: Four times a day (QID) | INTRAMUSCULAR | Status: AC | PRN

## 2019-03-25 NOTE — ED Notes (Signed)
Spoke with MD Jimmye Norman about pt presentation, see verbal order

## 2019-03-25 NOTE — ED Notes (Signed)
Patient transported to CT 

## 2019-03-25 NOTE — ED Triage Notes (Signed)
Pt c/o constipation xfew days. States last BM 10/31. Pt states he has also started vomiting x2 days. Appears uncomfortable . VSS

## 2019-03-25 NOTE — ED Provider Notes (Signed)
[facility] Emergency Department Provider Note    First MD Initiated Contact with Patient 03/25/19 1442     (approximate)  I have reviewed the triage vital signs and the nursing notes.   HISTORY  Chief Complaint Constipation and Emesis    HPI Micheal [name] is a 85 y.o. male the below listed past medical history previous uncertain abdominal surgery presents the ER for several days of generalized abdominal discomfort and vomiting.  States is not passing any gas and his main concern was that he was constipated because he has not moved his bowels in several days.   Denies any fever.  No chest pain or shortness of breath.   Past Medical History:  Diagnosis Date  . Balance problem    INTERMITTENT WITH NEGATIVE WORKUP  . COPD (chronic obstructive pulmonary disease) ([place])   . Coronary artery disease   . Diabetes mellitus without complication ([place])   . GERD (gastroesophageal reflux disease)   . Hypercholesteremia   . Hypertension   . Myocardial infarction ([place])    2005  . Pneumonia    RECENT 5/17  . Shortness of breath dyspnea   . Wheezing    No family history on file. Past Surgical History:  Procedure Laterality Date  . APPENDECTOMY    . CARDIAC CATHETERIZATION    . CATARACT EXTRACTION W/PHACO Right 10/04/2015   Procedure: CATARACT EXTRACTION PHACO AND INTRAOCULAR LENS PLACEMENT (IOC);  Surgeon: [name], MD;  Location: [facility] ORS;  Service: Ophthalmology;  Laterality: Right;  [place] 02:06 AP% 17.9 CDE 22.65 fluid pack lot # [id number]  . CATARACT EXTRACTION W/PHACO Left 12/13/2015   Procedure: CATARACT EXTRACTION PHACO AND INTRAOCULAR LENS PLACEMENT ([place]);  Surgeon: [name], MD;  Location: [facility] ORS;  Service: Ophthalmology;  Laterality: Left;  [place] 1.39 AP% 25.3 CDE 26.37 Fluid pack lot # [id number]  . COLONOSCOPY    . CORONARY ANGIOPLASTY     Stent placement  . ORIF ANKLE FRACTURE Left 01/10/2018   Procedure: OPEN REDUCTION INTERNAL  FIXATION (ORIF) ANKLE FRACTURE;  Surgeon: Dereck Leep, MD;  Location: [facility] ORS;  Service: Orthopedics;  Laterality: Left;   Patient Active Problem List   Diagnosis Date Noted  . Bimalleolar ankle fracture, left, closed, initial encounter 01/09/2018  . Respiratory failure with hypoxia and hypercapnia ([place]) 08/19/2017  . Community acquired pneumonia of left lower lobe of lung   . Controlled diabetes mellitus type 2 with complications ([place]) [id number]  . Gastroesophageal reflux disease without esophagitis 05/26/2015  . Alcohol abuse 01/01/2013  . COPD (chronic obstructive pulmonary disease) ([place]) 10/22/2012  . HTN (hypertension) 10/22/2012      Prior to Admission medications   Medication Sig Start Date End Date Taking? Authorizing Provider  albuterol (PROVENTIL HFA;VENTOLIN HFA) 108 (90 Base) MCG/ACT inhaler Inhale 2 puffs into the lungs every 6 (six) hours as needed for wheezing or shortness of breath. 08/21/17   [name], MD  albuterol (PROVENTIL) (2.5 MG/3ML) 0.083% nebulizer solution Inhale 3 mLs into the lungs every 4 (four) hours as needed for wheezing or shortness of breath. 08/26/17   [provider]  aspirin EC 81 MG tablet Take 81 mg by mouth daily. 08/26/17   [provider]  atorvastatin (LIPITOR) 40 MG tablet Take 1 tablet by mouth daily.     [provider]  carvedilol (COREG) 25 MG tablet Take 25 mg by mouth 2 (two) times daily with a meal.     [provider]  enoxaparin (LOVENOX)  40 MG/0.4ML injection Inject 0.4 mLs (40 mg total) into the skin daily. Patient not taking: Reported on 11/27/2018 01/12/18   Reche Dixon, PA-C  Fluticasone-Salmeterol (ADVAIR DISKUS) 100-50 MCG/DOSE AEPB Inhale 1 puff into the lungs 2 (two) times daily. 08/21/17 11/27/18  [name], MD  glipiZIDE (GLUCOTROL XL) 2.5 MG 24 hr tablet Take 2.5 mg by mouth daily with breakfast.    [provider]  ipratropium-albuterol (DUONEB) 0.5-2.5 (3) MG/3ML  SOLN Inhale 3 mLs into the lungs every 6 (six) hours as needed for wheezing. 08/26/17   [provider]  lisinopril (PRINIVIL,ZESTRIL) 10 MG tablet Take 10 mg by mouth daily.    [provider]  metFORMIN (GLUCOPHAGE) 500 MG tablet Take 2 tablets (1000MG ) by mouth every morning and 1 tablet (500MG ) by mouth every evening    [provider]  oxyCODONE (OXY IR/ROXICODONE) 5 MG immediate release tablet Take 1 tablet (5 mg total) by mouth every 4 (four) hours as needed for moderate pain (pain score 4-6). Patient not taking: Reported on 11/27/2018 01/12/18   Reche Dixon, PA-C  tiotropium (SPIRIVA HANDIHALER) 18 MCG inhalation capsule Place 1 capsule (18 mcg total) into inhaler and inhale daily. 08/21/17 11/27/18  [name], MD  traMADol (ULTRAM) 50 MG tablet Take 1-2 tablets (50-100 mg total) by mouth every 4 (four) hours as needed for moderate pain. Patient not taking: Reported on 11/27/2018 01/12/18   Reche Dixon, PA-C  traMADol (ULTRAM) 50 MG tablet Take 1 tablet (50 mg total) by mouth every 12 (twelve) hours as needed. 01/10/19   Sable Feil, PA-C    Allergies Patient has no known allergies.    Social History Social History   Tobacco Use  . Smoking status: Former Research scientist (life sciences)  . Smokeless tobacco: Former Network engineer Use Topics  . Alcohol use: No    Comment: OCCAS  . Drug use: Not on file    Review of Systems Patient denies headaches, rhinorrhea, blurry vision, numbness, shortness of breath, chest pain, edema, cough, abdominal pain, nausea, vomiting, diarrhea, dysuria, fevers, rashes or hallucinations unless otherwise stated above in HPI. ____________________________________________   PHYSICAL EXAM:  VITAL SIGNS: Vitals:   03/25/19 1343  BP: 114/75  Pulse: (!) 103  Resp: 20  Temp: 97.7 F (36.5 C)  SpO2: 95%    Constitutional: Alert and oriented.  Eyes: Conjunctivae are normal.  Head: Atraumatic. Nose: No congestion/rhinnorhea. Mouth/Throat:  Mucous membranes are moist.   Neck: No stridor. Painless ROM.  Cardiovascular: Normal rate, regular rhythm. Grossly normal heart sounds.  Good peripheral circulation. Respiratory: Normal respiratory effort.  No retractions. Lungs CTAB. Gastrointestinal: + distention tympanic to percussion.  No guarding or rebound No abdominal bruits. No CVA tenderness. Genitourinary:  Musculoskeletal: No lower extremity tenderness nor edema.  No joint effusions. Neurologic:  Normal speech and language. No gross focal neurologic deficits are appreciated. No facial droop Skin:  Skin is warm, dry and intact. No rash noted. Psychiatric: Mood and affect are normal. Speech and behavior are normal.  ____________________________________________   LABS (all labs ordered are listed, but only abnormal results are displayed)  Results for orders placed or performed during the hospital encounter of 03/25/19 (from the past 24 hour(s))  Lipase, blood     Status: None   Collection Time: 03/25/19 12:35 PM  Result Value Ref Range   Lipase 14 11 - 51 U/L  Comprehensive metabolic panel     Status: Abnormal   Collection Time: 03/25/19 12:35 PM  Result Value Ref  Range   Sodium 129 (L) 135 - 145 mmol/L   Potassium 4.2 3.5 - 5.1 mmol/L   Chloride 88 (L) 98 - 111 mmol/L   CO2 24 22 - 32 mmol/L   Glucose, Bld 366 (H) 70 - 99 mg/dL   BUN 57 (H) 8 - 23 mg/dL   Creatinine, Ser 2.14 (H) 0.61 - 1.24 mg/dL   Calcium 9.2 8.9 - 10.3 mg/dL   Total Protein 7.3 6.5 - 8.1 g/dL   Albumin 4.0 3.5 - 5.0 g/dL   AST 23 15 - 41 U/L   ALT 10 0 - 44 U/L   Alkaline Phosphatase 79 38 - 126 U/L   Total Bilirubin 1.0 0.3 - 1.2 mg/dL   GFR calc non Af Amer 27 (L) >60 mL/min   GFR calc Af Amer 32 (L) >60 mL/min   Anion gap 17 (H) 5 - 15  CBC     Status: Abnormal   Collection Time: 03/25/19 12:35 PM  Result Value Ref Range   WBC 27.9 (H) 4.0 - 10.5 K/uL   RBC 4.31 4.22 - 5.81 MIL/uL   Hemoglobin 13.0 13.0 - 17.0 g/dL   HCT 38.7 (L) 39.0 -  52.0 %   MCV 89.8 80.0 - 100.0 fL   MCH 30.2 26.0 - 34.0 pg   MCHC 33.6 30.0 - 36.0 g/dL   RDW 14.1 11.5 - 15.5 %   Platelets 295 150 - 400 K/uL   nRBC 0.0 0.0 - 0.2 %   ____________________________________________  EKG____________________________________________  RADIOLOGY  I personally reviewed all radiographic images ordered to evaluate for the above acute complaints and reviewed radiology reports and findings.  These findings were personally discussed with the patient.  Please see medical record for radiology report.  ____________________________________________   PROCEDURES  Procedure(s) performed:  Procedures    Critical Care performed: no ____________________________________________   INITIAL IMPRESSION / ASSESSMENT AND PLAN / ED COURSE  Pertinent labs & imaging results that were available during my care of the patient were reviewed by me and considered in my medical decision making (see chart for details).   DDX: sbo, hernia, perforation, mass, colitis, ileus  AVEREY MAROLD is a 85 y.o. who presents to the ED with abdominal pain nausea vomiting discomfort as described above.  Does have leukocytosis as well as acute kidney injury.  No significant metabolic acidosis but does have mildly elevated anion gap.  Also with elevated glucose.  Will provide IV fluids.  Will order CT imaging to further evaluate.  Certainly concerning for SBO.  Clinical Course as of Mar 25 1599  Wed Mar 25, 2019  1550 CT imaging confirms high-grade small bowel obstruction.  Likely secondary to adhesions.  Will discuss in consultation with surgery.  Will place NG tube.  Patient require admission the hospital for further medical management.   [PR]    Clinical Course User Index [PR] Merlyn Lot, MD    The patient was evaluated in Emergency Department today for the symptoms described in the history of present illness. He/she was evaluated in the context of the global COVID-19 pandemic,  which necessitated consideration that the patient might be at risk for infection with the SARS-CoV-2 virus that causes COVID-19. Institutional protocols and algorithms that pertain to the evaluation of patients at risk for COVID-19 are in a state of rapid change based on information released by regulatory bodies including the CDC and federal and state organizations. These policies and algorithms were followed during the patient's care in the  ED.  As part of my medical decision making, I reviewed the following data within the [place] notes reviewed and incorporated, Labs reviewed, notes from prior ED visits and [place] Controlled Substance Database   ____________________________________________   FINAL CLINICAL IMPRESSION(S) / ED DIAGNOSES  Final diagnoses:  Small bowel obstruction due to adhesions ([place])  AKI (acute kidney injury) ([place])      NEW MEDICATIONS STARTED DURING THIS VISIT:  New Prescriptions   No medications on file     Note:  This document was prepared using Dragon voice recognition software and may include unintentional dictation errors.    Merlyn Lot, MD 03/25/19 1600

## 2019-03-25 NOTE — H&P (Signed)
[facility] SURGICAL HISTORY & PHYSICAL (cpt [id number])  HISTORY OF PRESENT ILLNESS (HPI):  85 y.o. male presented to [facility] ED today for abdominal pain. Patient reports the onset of centralized abdominal pain on Sunday. The pain has been intermittent since that time but recently became more constant. He described this as a sharp cramping pain. Nothing makes the pain better or worse. He endorses associated nausea and emesis with the pain over the last 2 days. No fever, chills, cough, congestion, CP, or SOB. Last BM was Saturday. Not passing gas. He does report a prior abdominal surgery in which "they removed part of my intestines" but he is uncertain as to when. PO intake in the last 4 days has been decreased. Work up in the ED was concerning for leukocytosis, AKI, and small bowel obstruction on CT.   General surgery is consulted by emergency medicine physician Dr Merlyn Lot, MD for evaluation and management of small bowel obstruction.   PAST MEDICAL HISTORY (PMH):  Past Medical History:  Diagnosis Date  . Balance problem    INTERMITTENT WITH NEGATIVE WORKUP  . COPD (chronic obstructive pulmonary disease) ([place])   . Coronary artery disease   . Diabetes mellitus without complication ([place])   . GERD (gastroesophageal reflux disease)   . Hypercholesteremia   . Hypertension   . Myocardial infarction ([place])    2005  . Pneumonia    RECENT 5/17  . Shortness of breath dyspnea   . Wheezing     Reviewed. Otherwise negative.   PAST SURGICAL HISTORY ([place]):  Past Surgical History:  Procedure Laterality Date  . APPENDECTOMY    . CARDIAC CATHETERIZATION    . CATARACT EXTRACTION W/PHACO Right 10/04/2015   Procedure: CATARACT EXTRACTION PHACO AND INTRAOCULAR LENS PLACEMENT (IOC);  Surgeon: [name], MD;  Location: [facility] ORS;  Service: Ophthalmology;  Laterality: Right;  [place] 02:06 AP% 17.9 CDE 22.65 fluid pack lot # [id number]  . CATARACT EXTRACTION W/PHACO Left 12/13/2015   Procedure: CATARACT EXTRACTION PHACO AND INTRAOCULAR LENS PLACEMENT ([place]);  Surgeon: [name], MD;  Location: [facility] ORS;  Service: Ophthalmology;  Laterality: Left;  [place] 1.39 AP% 25.3 CDE 26.37 Fluid pack lot # [id number]  . COLONOSCOPY    . CORONARY ANGIOPLASTY     Stent placement  . ORIF ANKLE FRACTURE Left 01/10/2018   Procedure: OPEN REDUCTION INTERNAL FIXATION (ORIF) ANKLE FRACTURE;  Surgeon: Dereck Leep, MD;  Location: [facility] ORS;  Service: Orthopedics;  Laterality: Left;    Reviewed. Otherwise negative.   MEDICATIONS:  Prior to Admission medications   Medication Sig Start Date End Date Taking? Authorizing Provider  albuterol (PROVENTIL HFA;VENTOLIN HFA) 108 (90 Base) MCG/ACT inhaler Inhale 2 puffs into the lungs every 6 (six) hours as needed for wheezing or shortness of breath. 08/21/17  Yes [name], MD  albuterol (PROVENTIL) (2.5 MG/3ML) 0.083% nebulizer solution Inhale 3 mLs into the lungs every 4 (four) hours as needed for wheezing or shortness of breath. 08/26/17   [provider]  aspirin EC 81 MG tablet Take 81 mg by mouth daily. 08/26/17   [provider]  atorvastatin (LIPITOR) 40 MG tablet Take 1 tablet by mouth daily.     [provider]  carvedilol (COREG) 25 MG tablet Take 25 mg by mouth 2 (two) times daily with a meal.     [provider]  Fluticasone-Salmeterol (ADVAIR DISKUS) 100-50 MCG/DOSE AEPB Inhale 1 puff into the lungs 2 (two) times daily. 08/21/17 11/27/18  [name], MD  glipiZIDE (GLUCOTROL XL) 2.5 MG 24 hr tablet Take 2.5 mg by mouth daily with breakfast.    [provider]  ipratropium-albuterol (DUONEB) 0.5-2.5 (3) MG/3ML SOLN Inhale 3 mLs into the lungs every 6 (six) hours as needed for wheezing. 08/26/17   [provider]  lisinopril (PRINIVIL,ZESTRIL) 10 MG tablet Take 10 mg by mouth daily.    [provider]  metFORMIN (GLUCOPHAGE) 500 MG tablet Take 2 tablets (1000MG ) by  mouth every morning and 1 tablet (500MG ) by mouth every evening    [provider]  tiotropium (SPIRIVA HANDIHALER) 18 MCG inhalation capsule Place 1 capsule (18 mcg total) into inhaler and inhale daily. 08/21/17 11/27/18  [name], MD  traMADol (ULTRAM) 50 MG tablet Take 1 tablet (50 mg total) by mouth every 12 (twelve) hours as needed. 01/10/19   Sable Feil, PA-C     ALLERGIES:  No Known Allergies   SOCIAL HISTORY:  Social History   Socioeconomic History  . Marital status: Married    Spouse name: Not on file  . Number of children: Not on file  . Years of education: Not on file  . Highest education level: Not on file  Occupational History  . Not on file  Social Needs  . Financial resource strain: Not on file  . Food insecurity    Worry: Not on file    Inability: Not on file  . Transportation needs    Medical: Not on file    Non-medical: Not on file  Tobacco Use  . Smoking status: Former Research scientist (life sciences)  . Smokeless tobacco: Former Network engineer and Sexual Activity  . Alcohol use: No    Comment: OCCAS  . Drug use: Not on file  . Sexual activity: Not on file  Lifestyle  . Physical activity    Days per week: Not on file    Minutes per session: Not on file  . Stress: Not on file  Relationships  . Social Herbalist on phone: Not on file    Gets together: Not on file    Attends religious service: Not on file    Active member of club or organization: Not on file    Attends meetings of clubs or organizations: Not on file    Relationship status: Not on file  . Intimate partner violence    Fear of current or ex partner: Not on file    Emotionally abused: Not on file    Physically abused: Not on file    Forced sexual activity: Not on file  Other Topics Concern  . Not on file  Social History Narrative  . Not on file     FAMILY HISTORY:  No family history on file.  Otherwise negative.   REVIEW OF SYSTEMS:  Review of Systems  Constitutional:  Negative for chills and fever.  HENT: Negative for congestion and sore throat.   Respiratory: Negative for cough and shortness of breath.   Cardiovascular: Negative for chest pain and palpitations.  Gastrointestinal: Positive for abdominal pain, constipation, nausea and vomiting. Negative for blood in stool and diarrhea.  Genitourinary: Negative for dysuria and urgency.  All other systems reviewed and are negative.   VITAL SIGNS:  Temp:  [97.7 F (36.5 C)] 97.7 F (36.5 C) (11/04 1343) Pulse Rate:  [100-103] 100 (11/04 1530) Resp:  [20] 20 (11/04 1343) BP: (114-150)/(58-75) 150/58 (11/04 1530) SpO2:  [95 %-98 %] 98 % (11/04 1530) Weight:  [63.5 kg] 63.5 kg (11/04  1343)     Height: 5\' 7"  (170.2 cm) Weight: 63.5 kg BMI (Calculated): 21.92   PHYSICAL EXAM:  Physical Exam Vitals signs and nursing note reviewed.  Constitutional:      General: He is not in acute distress.    Appearance: Normal appearance. He is normal weight. He is not ill-appearing.  HENT:     Head: Normocephalic and atraumatic.  Eyes:     General: No scleral icterus.    Conjunctiva/sclera: Conjunctivae normal.  Cardiovascular:     Rate and Rhythm: Normal rate and regular rhythm.     Pulses: Normal pulses.     Heart sounds: No murmur. No friction rub. No gallop.   Pulmonary:     Effort: Pulmonary effort is normal. No respiratory distress.     Breath sounds: Normal breath sounds. No wheezing or rhonchi.  Abdominal:     General: Abdomen is protuberant. A surgical scar is present. There is distension.     Palpations: Abdomen is soft.     Tenderness: There is generalized abdominal tenderness. There is no guarding or rebound.     Comments: Markedly distended, no peritonitis, previous lower laparotomy incision is well healed  Genitourinary:    Comments: Deferred Skin:    General: Skin is warm and dry.     Coloration: Skin is not pale.     Findings: No erythema.  Neurological:     General: No focal deficit  present.     Mental Status: He is alert and oriented to person, place, and time.  Psychiatric:        Mood and Affect: Mood normal.        Behavior: Behavior normal.     INTAKE/OUTPUT:  This shift: No intake/output data recorded.  Last 2 shifts: @IOLAST2SHIFTS @  Labs:  CBC Latest Ref Rng & Units 03/25/2019 11/27/2018 01/09/2018  WBC 4.0 - 10.5 K/uL 27.9(H) 11.4(H) 12.7(H)  Hemoglobin 13.0 - 17.0 g/dL 13.0 11.8(L) 10.1(L)  Hematocrit 39.0 - 52.0 % 38.7(L) 36.9(L) 30.4(L)  Platelets 150 - 400 K/uL 295 209 191   CMP Latest Ref Rng & Units 03/25/2019 11/27/2018 01/09/2018  Glucose 70 - 99 mg/dL 366(H) 126(H) 95  BUN 8 - 23 mg/dL 57(H) 35(H) 28(H)  Creatinine 0.61 - 1.24 mg/dL 2.14(H) 1.55(H) 1.63(H)  Sodium 135 - 145 mmol/L 129(L) 137 140  Potassium 3.5 - 5.1 mmol/L 4.2 5.3(H) 5.1  Chloride 98 - 111 mmol/L 88(L) 103 108  CO2 22 - 32 mmol/L 24 29 29   Calcium 8.9 - 10.3 mg/dL 9.2 8.8(L) 9.0  Total Protein 6.5 - 8.1 g/dL 7.3 6.3(L) -  Total Bilirubin 0.3 - 1.2 mg/dL 1.0 0.4 -  Alkaline Phos 38 - 126 U/L 79 64 -  AST 15 - 41 U/L 23 15 -  ALT 0 - 44 U/L 10 8 -     Imaging studies:   CT Abdomen/Pelvis (03/25/2019) personally reviewed which shows distension of small bowel with transition in lower abdomen. There is also swirling of the intestines but unclear if this represents adhesions, volvulus, or internal hernia although he clinically looks well. Radiologist report also reviewed:  IMPRESSION: 1. High-grade small bowel obstruction with transition point in the central lower abdomen, potentially due to an adhesion. Mild small bowel inflammatory changes without wall thickening or pneumatosis. 2.  Aortic atherosclerosis (ICD10-I70.0).    Assessment/Plan: (ICD-10's: K3.50) 85 y.o. male with small bowel obstruction most likely attributable to post-surgical adhesions although there is question of possible volvulus vs internal hernia on imaging  but his abdominal examination and vitals are  reassuring   - admit to general surgery; consult medicine for comorbidities   - Recommend NGT decompression  - NPO + IVF  - monitor abdominal examination; on-going bowel function  - Morning KUB  - No indication for emergent surgery but he understands that if he fails conservative management he may need to undergo exploratory laparotomy.   - Mobilization encouraged  All of the above findings and recommendations were discussed with the patient, and all of his questions were answered to his expressed satisfaction.  -- Edison Simon, PA-C [facility] 03/25/2019, 4:44 PM [phone] M-F: 7am - 4pm

## 2019-03-26 ENCOUNTER — Inpatient Hospital Stay: Payer: Medicare Other

## 2019-03-26 ENCOUNTER — Other Ambulatory Visit: Payer: Self-pay

## 2019-03-26 DIAGNOSIS — E871 Hypo-osmolality and hyponatremia: Secondary | ICD-10-CM | POA: Diagnosis present

## 2019-03-26 DIAGNOSIS — N179 Acute kidney failure, unspecified: Secondary | ICD-10-CM | POA: Diagnosis present

## 2019-03-26 DIAGNOSIS — F17200 Nicotine dependence, unspecified, uncomplicated: Secondary | ICD-10-CM | POA: Diagnosis present

## 2019-03-26 DIAGNOSIS — D72829 Elevated white blood cell count, unspecified: Secondary | ICD-10-CM | POA: Diagnosis present

## 2019-03-26 LAB — BASIC METABOLIC PANEL
Anion gap: 13 (ref 5–15)
BUN: 51 mg/dL — ABNORMAL HIGH (ref 8–23)
CO2: 33 mmol/L — ABNORMAL HIGH (ref 22–32)
Calcium: 9.2 mg/dL (ref 8.9–10.3)
Chloride: 93 mmol/L — ABNORMAL LOW (ref 98–111)
Creatinine, Ser: 1.87 mg/dL — ABNORMAL HIGH (ref 0.61–1.24)
GFR calc Af Amer: 37 mL/min — ABNORMAL LOW (ref >60)
GFR calc non Af Amer: 32 mL/min — ABNORMAL LOW (ref >60)
Glucose, Bld: 98 mg/dL (ref 70–99)
Potassium: 3.7 mmol/L (ref 3.5–5.1)
Sodium: 139 mmol/L (ref 135–145)

## 2019-03-26 LAB — CBC WITH DIFFERENTIAL/PLATELET
Abs Immature Granulocytes: 0.11 10*3/uL — ABNORMAL HIGH (ref 0.00–0.07)
Basophils Absolute: 0 10*3/uL (ref 0.0–0.1)
Basophils Relative: 0 %
Eosinophils Absolute: 0 10*3/uL (ref 0.0–0.5)
Eosinophils Relative: 0 %
HCT: 36.7 % — ABNORMAL LOW (ref 39.0–52.0)
Hemoglobin: 12 g/dL — ABNORMAL LOW (ref 13.0–17.0)
Immature Granulocytes: 1 %
Lymphocytes Relative: 5 %
Lymphs Abs: 1 10*3/uL (ref 0.7–4.0)
MCH: 30.1 pg (ref 26.0–34.0)
MCHC: 32.7 g/dL (ref 30.0–36.0)
MCV: 92 fL (ref 80.0–100.0)
Monocytes Absolute: 1.2 10*3/uL — ABNORMAL HIGH (ref 0.1–1.0)
Monocytes Relative: 6 %
Neutro Abs: 16.8 10*3/uL — ABNORMAL HIGH (ref 1.7–7.7)
Neutrophils Relative %: 88 %
Platelets: 226 10*3/uL (ref 150–400)
RBC: 3.99 MIL/uL — ABNORMAL LOW (ref 4.22–5.81)
RDW: 14.3 % (ref 11.5–15.5)
WBC: 19 10*3/uL — ABNORMAL HIGH (ref 4.0–10.5)
nRBC: 0 % (ref 0.0–0.2)

## 2019-03-26 LAB — GLUCOSE, CAPILLARY
Glucose-Capillary: 101 mg/dL — ABNORMAL HIGH (ref 70–99)
Glucose-Capillary: 113 mg/dL — ABNORMAL HIGH (ref 70–99)
Glucose-Capillary: 116 mg/dL — ABNORMAL HIGH (ref 70–99)
Glucose-Capillary: 124 mg/dL — ABNORMAL HIGH (ref 70–99)
Glucose-Capillary: 132 mg/dL — ABNORMAL HIGH (ref 70–99)
Glucose-Capillary: 142 mg/dL — ABNORMAL HIGH (ref 70–99)
Glucose-Capillary: 163 mg/dL — ABNORMAL HIGH (ref 70–99)
Glucose-Capillary: 96 mg/dL (ref 70–99)

## 2019-03-26 LAB — MAGNESIUM: Magnesium: 2.2 mg/dL (ref 1.7–2.4)

## 2019-03-26 MED ORDER — TAMSULOSIN HCL 0.4 MG PO CAPS
0.4000 mg | ORAL_CAPSULE | Freq: Every day | ORAL | Status: DC
  Administered 2019-03-26 – 2019-03-29 (×3): 0.4 mg via ORAL
  Filled 2019-03-26 (×7): qty 1

## 2019-03-26 MED ORDER — NICOTINE 14 MG/24HR TD PT24
14.0000 mg | MEDICATED_PATCH | Freq: Every day | TRANSDERMAL | Status: DC
  Administered 2019-03-30 – 2019-04-05 (×6): 14 mg via TRANSDERMAL
  Filled 2019-03-26 (×20): qty 1

## 2019-03-26 MED ORDER — CHLORHEXIDINE GLUCONATE CLOTH 2 % EX PADS
6.0000 | MEDICATED_PAD | Freq: Every day | CUTANEOUS | Status: DC
  Administered 2019-03-26 – 2019-04-15 (×19): 6 via TOPICAL

## 2019-03-26 MED ORDER — IPRATROPIUM-ALBUTEROL 0.5-2.5 (3) MG/3ML IN SOLN
3.0000 mL | Freq: Four times a day (QID) | RESPIRATORY_TRACT | Status: DC
  Administered 2019-03-26 – 2019-03-28 (×8): 3 mL via RESPIRATORY_TRACT
  Filled 2019-03-26 (×8): qty 3

## 2019-03-26 NOTE — Progress Notes (Signed)
Patient complained of nausea. NGT has moved from 55cm at admission to 41cm. MD notified and he ordered to advance to 70. NGT advanced to only 50cm due to patient not being able to tolerate it to be advanced any further.

## 2019-03-26 NOTE — Consult Note (Signed)
[facility] Initial Consultation Note   Patient Name: Micheal [name]    [id number] PCP: Gweneth Fritter, MD     DOB: [birth year]  DOA: 03/25/2019 DOS: the patient was seen and examined on 03/26/2019   Referring physician: Olean Ree, MD  Reason for consult: Medical management of COPD and diabetes  HPI: Micheal [name] is a 85 y.o. male with Past medical history of COPD, CAD, type II DM, GERD, HTN, HLD, active smoker. Patient is coming from Home Patient presented with complaints of abdominal pain and nausea and vomiting.  Patient has constipation ongoing for last few days. Last bowel movement was reportedly on March 21, 2019.  He started having vomiting 2 days ago.  He reports that he is not passing any gas right now. He also feels that he is feeling better right now after NG tube insertion but has belly still feels tight and distended to him. No fever no chills.  No chest pain no shortness of breath.  No cough.  No swelling in the legs. He smokes half a pack a day. He does use inhalers at home on a regular basis.  Review of Systems: as mentioned in the history of present illness.  All other systems reviewed and are negative.  Past Medical History:  Diagnosis Date  . Balance problem    INTERMITTENT WITH NEGATIVE WORKUP  . COPD (chronic obstructive pulmonary disease) ([place])   . Coronary artery disease   . Diabetes mellitus without complication ([place])   . GERD (gastroesophageal reflux disease)   . Hypercholesteremia   . Hypertension   . Myocardial infarction ([place])    2005  . Pneumonia    RECENT 5/17  . Shortness of breath dyspnea   . Wheezing    Past Surgical History:  Procedure Laterality Date  . APPENDECTOMY    . CARDIAC CATHETERIZATION    . CATARACT EXTRACTION W/PHACO Right 10/04/2015   Procedure: CATARACT EXTRACTION PHACO AND INTRAOCULAR LENS PLACEMENT (IOC);  Surgeon: [name], MD;  Location: [facility] ORS;  Service: Ophthalmology;  Laterality: Right;  [place]  02:06 AP% 17.9 CDE 22.65 fluid pack lot # [id number]  . CATARACT EXTRACTION W/PHACO Left 12/13/2015   Procedure: CATARACT EXTRACTION PHACO AND INTRAOCULAR LENS PLACEMENT ([place]);  Surgeon: [name], MD;  Location: [facility] ORS;  Service: Ophthalmology;  Laterality: Left;  [place] 1.39 AP% 25.3 CDE 26.37 Fluid pack lot # [id number]  . COLONOSCOPY    . CORONARY ANGIOPLASTY     Stent placement  . ORIF ANKLE FRACTURE Left 01/10/2018   Procedure: OPEN REDUCTION INTERNAL FIXATION (ORIF) ANKLE FRACTURE;  Surgeon: Dereck Leep, MD;  Location: [facility] ORS;  Service: Orthopedics;  Laterality: Left;   Social History:  reports that he has quit smoking. He has quit using smokeless tobacco. He reports that he does not drink alcohol. No history on file for drug.  No Known Allergies  Family history reviewed and not pertinent   Prior to Admission medications   Medication Sig Start Date End Date Taking? Authorizing Provider  albuterol (PROVENTIL HFA;VENTOLIN HFA) 108 (90 Base) MCG/ACT inhaler Inhale 2 puffs into the lungs every 6 (six) hours as needed for wheezing or shortness of breath. 08/21/17  Yes [name], MD  albuterol (PROVENTIL) (2.5 MG/3ML) 0.083% nebulizer solution Inhale 3 mLs into the lungs every 4 (four) hours as needed for wheezing or shortness of breath. 08/26/17  Yes [provider]  aspirin EC 81 MG tablet Take 81 mg by mouth daily. 08/26/17  Yes  [provider]  atorvastatin (LIPITOR) 40 MG tablet Take 40 mg by mouth daily.    Yes [provider]  carvedilol (COREG) 6.25 MG tablet Take 6.25 mg by mouth 2 (two) times daily with a meal.    Yes [provider]  Fluticasone-Salmeterol (ADVAIR DISKUS) 100-50 MCG/DOSE AEPB Inhale 1 puff into the lungs 2 (two) times daily. 08/21/17 03/25/19 Yes [name], MD  glipiZIDE (GLUCOTROL XL) 2.5 MG 24 hr tablet Take 2.5 mg by mouth daily with breakfast.   Yes [provider]  ipratropium-albuterol  (DUONEB) 0.5-2.5 (3) MG/3ML SOLN Inhale 3 mLs into the lungs every 6 (six) hours as needed for wheezing. 08/26/17  Yes [provider]  metFORMIN (GLUCOPHAGE-XR) 500 MG 24 hr tablet Take 1,000 mg by mouth every evening.   Yes [provider]  Multiple Vitamin (MULTI-VITAMIN) tablet Take 1 tablet by mouth daily.   Yes [provider]  tamsulosin (FLOMAX) 0.4 MG CAPS capsule Take 0.4 mg by mouth daily. 12/11/18  Yes [provider]  tiotropium (SPIRIVA HANDIHALER) 18 MCG inhalation capsule Place 1 capsule (18 mcg total) into inhaler and inhale daily. 08/21/17 03/25/19 Yes [name], MD  traMADol (ULTRAM) 50 MG tablet Take 1 tablet (50 mg total) by mouth every 12 (twelve) hours as needed. 01/10/19  Yes Sable Feil, PA-C  lisinopril (PRINIVIL,ZESTRIL) 10 MG tablet Take 10 mg by mouth daily.    [provider]    Physical Exam: Vitals:   03/25/19 2300 03/26/19 0031 03/26/19 0123 03/26/19 0454  BP: 121/64 129/61 129/70 130/72  Pulse: 92 82 92 77  Resp:  16 20 20   Temp:   97.6 F (36.4 C) 98.1 F (36.7 C)  TempSrc:   Oral Oral  SpO2:  92% 91% 93%  Weight:      Height:       General: alert and oriented to time, place, and person. Appear in mild distress, affect appropriate Eyes: PERRL, Conjunctiva normal ENT: Oral Mucosa Clear, moist  Neck: no JVD, no Abnormal Mass Or lumps Cardiovascular: S1 and S2 Present, no Murmur, peripheral pulses symmetrical Respiratory: normal respiratory effort, Bilateral Air entry equal and Decreased, no use of accessory muscle, no Crackles, Occasional expiratory bilateral  wheezes Abdomen: Bowel Sound present, tympanic, distended and diffuse tenderness, no hernia Skin: no rashes  Extremities: no Pedal edema, no calf tenderness Neurologic: without any new focal findings Gait not checked due to patient safety concerns   Labs:  CBC: Recent Labs  Lab 03/25/19 1235 03/26/19 0553  WBC 27.9* 19.0*  NEUTROABS   --  16.8*  HGB 13.0 12.0*  HCT 38.7* 36.7*  MCV 89.8 92.0  PLT 295 [id number]   Basic Metabolic Panel: Recent Labs  Lab 03/25/19 1235 03/26/19 0553  NA 129* 139  K 4.2 3.7  CL 88* 93*  CO2 24 33*  GLUCOSE 366* 98  BUN 57* 51*  CREATININE 2.14* 1.87*  CALCIUM 9.2 9.2  MG  --  2.2   Liver Function Tests: Recent Labs  Lab 03/25/19 1235  AST 23  ALT 10  ALKPHOS 79  BILITOT 1.0  PROT 7.3  ALBUMIN 4.0   Recent Labs  Lab 03/25/19 1235  LIPASE 14   No results for input(s): AMMONIA in the last 168 hours.  Cardiac Enzymes: No results for input(s): CKTOTAL, CKMB, CKMBINDEX, TROPONINI in the last 168 hours. No results for input(s): PROBNP in the last 8760 hours.  CBG: Recent Labs  Lab 03/25/19 1849 03/25/19 2029  03/26/19 0210 03/26/19 0451  GLUCAP 104* 103* 163* 101*    Radiological Exams: Ct Abdomen Pelvis Wo Contrast  Result Date: 03/25/2019 CLINICAL DATA:  Vomiting and constipation. EXAM: CT ABDOMEN AND PELVIS WITHOUT CONTRAST TECHNIQUE: Multidetector CT imaging of the abdomen and pelvis was performed following the standard protocol without IV contrast. COMPARISON:  Abdominal x-rays from same day. CT abdomen and pelvis dated November 27, 2018. FINDINGS: Lower chest: No acute abnormality. Hepatobiliary: No focal liver abnormality is seen. No gallstones, gallbladder wall thickening, or biliary dilatation. Pancreas: Unremarkable. No pancreatic ductal dilatation or surrounding inflammatory changes. Spleen: Normal in size without focal abnormality. Punctate calcified granuloma. Adrenals/Urinary Tract: Adrenal glands are unremarkable. Kidneys are normal, without renal calculi, focal lesion, or hydronephrosis. Bladder is unremarkable. Stomach/Bowel: Multiple dilated loops of proximal mid small bowel with transition point in the central lower abdomen (series 2, image 50). Mild inflammatory changes surrounding the dilated small bowel loops without significant wall thickening. No  pneumatosis. The ileum is decompressed. The stomach is unremarkable. Mild left-sided colonic diverticulosis. History of appendectomy. Vascular/Lymphatic: Aortic atherosclerosis. No enlarged abdominal or pelvic lymph nodes. Reproductive: Mild prostatomegaly. Other: No free fluid or pneumoperitoneum. Musculoskeletal: No acute or significant osseous findings. Subacute to chronic right eighth through tenth rib fractures. IMPRESSION: 1. High-grade small bowel obstruction with transition point in the central lower abdomen, potentially due to an adhesion. Mild small bowel inflammatory changes without wall thickening or pneumatosis. 2.  Aortic atherosclerosis (ICD10-I70.0). Electronically Signed   By: Titus Dubin M.D.   On: 03/25/2019 15:42   Dg Abd 1 View  Result Date: 03/26/2019 CLINICAL DATA:  Small-bowel obstruction EXAM: ABDOMEN - 1 VIEW COMPARISON:  03/25/2019 FINDINGS: NG tube in the proximal stomach with the side hole distal esophagus. Recommend advancing NG tube into the stomach. Dilated small bowel loops with mild interval improvement. Colon nondilated. Stool in the right colon. IMPRESSION: NG side hole distal esophagus, recommend advancing into the stomach Mild improvement in small bowel dilatation. Electronically Signed   By: Franchot Gallo M.D.   On: 03/26/2019 07:36   Dg Abdomen 1 View  Result Date: 03/25/2019 CLINICAL DATA:  Encounter for NG tube placement. EXAM: ABDOMEN - 1 VIEW COMPARISON:  Abdominal radiograph 03/25/2019 at 12:57 p.m. FINDINGS: The nasogastric tube side port projects over the distal esophagus above the GE junction. Redemonstrated multiple dilated loops of bowel consistent with small bowel obstruction. No evidence of free air. IMPRESSION: The nasogastric tube side-port projects over the distal esophagus. Recommend advancement of approximately 9 cm into the stomach. Electronically Signed   By: Audie Pinto M.D.   On: 03/25/2019 18:43   Dg Abd Portable 2 Views  Result  Date: 03/25/2019 CLINICAL DATA:  Constipation few days with vomiting 2 days. EXAM: PORTABLE ABDOMEN - 2 VIEW COMPARISON:  None. FINDINGS: Examination demonstrates several air-filled dilated small bowel loops over the left central to mid abdomen measuring up to 5.3 cm in diameter. There are a few scattered air-fluid levels present. No evidence of free peritoneal air. Air is present throughout the colon. Degenerative change of the spine and hips. IMPRESSION: Several air-filled dilated small bowel loops measuring up to 5.3 cm in diameter with a few scattered air-fluid levels. Findings likely due to early/partial small bowel obstruction and less likely ileus. Electronically Signed   By: Marin Olp M.D.   On: 03/25/2019 13:19   Assessment/Plan Principal Problem:   SBO (small bowel obstruction) (HCC) SP NG tube insertion. Currently on IV fluids IV antiemetic as well  as IV pain medication. Also on IV PPI. Management per surgery. Significant leukocytosis which is getting better. NG tube is currently not in stomach based on the x-ray in the morning. Defer to primary team.  Active Problems:   Type 2 diabetes mellitus, uncontrolled, with renal complications (HCC) Hemoglobin A1c 8.3. Uncontrolled with hyperglycemia. Currently on sliding scale insulin moderate every 4 hours. Holding other medications. Also on LR. Monitor.    COPD (chronic obstructive pulmonary disease) (HCC) Occasional wheezing. Continue inhalers. Add duo nebs. We will optimize respiratory function. Patient has high risk for ending up on the surgical table.    Gastroesophageal reflux disease without esophagitis Currently on IV PPI.    HTN (hypertension) Blood pressure stable. Continue to hold home blood pressure medication.    Smoker Nicotine patch added.    Acute renal failure superimposed on stage 3b chronic kidney disease ([place])   Hyponatremia improving with hydration Likely from poor p.o. intake as well as vomiting.  Currently receiving IV hydration. Check bladder scan. We will provide him his Flomax. Baseline renal function around 1.6-1.5.  Currently improving. On presentation it was 2.0.    Leucocytosis In response to SBO and stress-induced. Monitor. No evidence of active infection.   SARS Covid negative.  Family Communication: None at bedside Primary team communication: Via CH[facility] note  Thank you very much for involving [place] in care of your patient.  We will continue to follow the patient.   Author: Berle Mull, MD [facility] 03/26/2019 7:52 AM    If 7PM-7AM, please contact night-coverage [email or web address]

## 2019-03-26 NOTE — Progress Notes (Signed)
Order placed for urinary catheter insertion. Orders followed, foley catheter placed. Will continue to monitor output.

## 2019-03-26 NOTE — Progress Notes (Addendum)
[facility] SURGICAL PROGRESS NOTE (cpt [phone])  Hospital Day(s): 1.   Interval History: Patient seen and examined, no acute events or new complaints overnight. Patient reports still feeling very distended but abdominal pain has improved. No fever, chills, nausea, or emesis. There were issues with the NGT overnight regarding placement and positioning. No specific NGT output recorded however there is "other" documented in output which is 800 ccs. Leukocytosis and renal function improved. Electrolyte derangements improved.    Review of Systems:  Constitutional: denies fever, chills  HEENT: denies cough or congestion  Respiratory: denies any shortness of breath  Cardiovascular: denies chest pain or palpitations  Gastrointestinal: + distension, denies abdominal pain, N/V, or diarrhea/and bowel function as per interval history Genitourinary: denies burning with urination or urinary frequency  Vital signs in last 24 hours: [min-max] current  Temp:  [97.6 F (36.4 C)-98.1 F (36.7 C)] 98.1 F (36.7 C) (11/05 0454) Pulse Rate:  [50-103] 77 (11/05 0454) Resp:  [16-20] 20 (11/05 0454) BP: (114-157)/(58-87) 130/72 (11/05 0454) SpO2:  [91 %-98 %] 93 % (11/05 0454) Weight:  [63.5 kg] 63.5 kg (11/04 1343)     Height: 5\' 7"  (170.2 cm) Weight: 63.5 kg BMI (Calculated): 21.92   Intake/Output last 2 shifts:  11/04 0701 - 11/05 0700 In: 1030 [NG/GT:30; IV Piggyback:1000] Out: 1050 [Urine:250]   Physical Exam:  Constitutional: alert, cooperative and no distress  HENT: normocephalic without obvious abnormality  Eyes: PERRL, EOM's grossly intact and symmetric, NGT in place, advanced  Respiratory: breathing non-labored at rest  Cardiovascular: regular rate and sinus rhythm  Gastrointestinal: soft, non-tender, grossly distended, No rebound/no guarding. No peritonitis. Previous healed surgical scars present Musculoskeletal: no edema or wounds, motor and sensation grossly intact, NT     Labs:  CBC Latest Ref Rng & Units 03/26/2019 03/25/2019 11/27/2018  WBC 4.0 - 10.5 K/uL 19.0(H) 27.9(H) 11.4(H)  Hemoglobin 13.0 - 17.0 g/dL 12.0(L) 13.0 11.8(L)  Hematocrit 39.0 - 52.0 % 36.7(L) 38.7(L) 36.9(L)  Platelets 150 - 400 K/uL 226 295 209   CMP Latest Ref Rng & Units 03/26/2019 03/25/2019 11/27/2018  Glucose 70 - 99 mg/dL 98 366(H) 126(H)  BUN 8 - 23 mg/dL 51(H) 57(H) 35(H)  Creatinine 0.61 - 1.24 mg/dL 1.87(H) 2.14(H) 1.55(H)  Sodium 135 - 145 mmol/L 139 129(L) 137  Potassium 3.5 - 5.1 mmol/L 3.7 4.2 5.3(H)  Chloride 98 - 111 mmol/L 93(L) 88(L) 103  CO2 22 - 32 mmol/L 33(H) 24 29  Calcium 8.9 - 10.3 mg/dL 9.2 9.2 8.8(L)  Total Protein 6.5 - 8.1 g/dL - 7.3 6.3(L)  Total Bilirubin 0.3 - 1.2 mg/dL - 1.0 0.4  Alkaline Phos 38 - 126 U/L - 79 64  AST 15 - 41 U/L - 23 15  ALT 0 - 44 U/L - 10 8     Imaging studies:   KUB (03/26/2019) personally reviewed showing dilated loops of bowel and NGT in the distal esophagus, and radiologist report reviewed:  IMPRESSION: NG side hole distal esophagus, recommend advancing into the stomach  Mild improvement in small bowel dilatation.   Assessment/Plan: (ICD-10's: K21.50) 85 y.o. male with improving leukocytosis and renal function with stable unresolved SBO (there were issues with NGT positioning overnight) most likely attributable to post-surgical adhesions although there is question of possible volvulus vs internal hernia on imaging but his abdominal examination and vitals are reassuring   - NGT repositioned this morning; monitor and record output    - Continue NPO + IVF  - pain control  prn (minimize narcotics); antiemetics prn  - monitor abdominal examination; on-going bowel function             - Morning KUBs +/- gastrografin if does no signs of resolution in 24-48 hours             - No indication for emergent surgery but he understands that if he fails conservative management he may need to undergo exploratory laparotomy.   -  medical management of comorbidities; hold home medications for now             - Mobilization encouraged  All of the above findings and recommendations were discussed with the patient, and the medical team, and all of patient's questions were answered to his expressed satisfaction.  -- Edison Simon, PA-C [facility] 03/26/2019, 8:52 AM [phone] M-F: 7am - 4pm

## 2019-03-27 ENCOUNTER — Inpatient Hospital Stay: Payer: Medicare Other

## 2019-03-27 LAB — CBC WITH DIFFERENTIAL/PLATELET
Abs Immature Granulocytes: 0.05 10*3/uL (ref 0.00–0.07)
Basophils Absolute: 0 10*3/uL (ref 0.0–0.1)
Basophils Relative: 0 %
Eosinophils Absolute: 0 10*3/uL (ref 0.0–0.5)
Eosinophils Relative: 0 %
HCT: 35.7 % — ABNORMAL LOW (ref 39.0–52.0)
Hemoglobin: 11.3 g/dL — ABNORMAL LOW (ref 13.0–17.0)
Immature Granulocytes: 0 %
Lymphocytes Relative: 7 %
Lymphs Abs: 0.9 10*3/uL (ref 0.7–4.0)
MCH: 29.7 pg (ref 26.0–34.0)
MCHC: 31.7 g/dL (ref 30.0–36.0)
MCV: 93.9 fL (ref 80.0–100.0)
Monocytes Absolute: 0.9 10*3/uL (ref 0.1–1.0)
Monocytes Relative: 6 %
Neutro Abs: 11.7 10*3/uL — ABNORMAL HIGH (ref 1.7–7.7)
Neutrophils Relative %: 87 %
Platelets: 217 10*3/uL (ref 150–400)
RBC: 3.8 MIL/uL — ABNORMAL LOW (ref 4.22–5.81)
RDW: 14 % (ref 11.5–15.5)
WBC: 13.6 10*3/uL — ABNORMAL HIGH (ref 4.0–10.5)
nRBC: 0 % (ref 0.0–0.2)

## 2019-03-27 LAB — BASIC METABOLIC PANEL
Anion gap: 12 (ref 5–15)
BUN: 42 mg/dL — ABNORMAL HIGH (ref 8–23)
CO2: 29 mmol/L (ref 22–32)
Calcium: 8.4 mg/dL — ABNORMAL LOW (ref 8.9–10.3)
Chloride: 97 mmol/L — ABNORMAL LOW (ref 98–111)
Creatinine, Ser: 1.66 mg/dL — ABNORMAL HIGH (ref 0.61–1.24)
GFR calc Af Amer: 43 mL/min — ABNORMAL LOW (ref >60)
GFR calc non Af Amer: 37 mL/min — ABNORMAL LOW (ref >60)
Glucose, Bld: 108 mg/dL — ABNORMAL HIGH (ref 70–99)
Potassium: 3.7 mmol/L (ref 3.5–5.1)
Sodium: 138 mmol/L (ref 135–145)

## 2019-03-27 LAB — URINALYSIS, COMPLETE (UACMP) WITH MICROSCOPIC
Bilirubin Urine: NEGATIVE
Glucose, UA: NEGATIVE mg/dL
Ketones, ur: 20 mg/dL — AB
Nitrite: NEGATIVE
Protein, ur: NEGATIVE mg/dL
Specific Gravity, Urine: 1.016 (ref 1.005–1.030)
pH: 5 (ref 5.0–8.0)

## 2019-03-27 LAB — GLUCOSE, CAPILLARY
Glucose-Capillary: 108 mg/dL — ABNORMAL HIGH (ref 70–99)
Glucose-Capillary: 114 mg/dL — ABNORMAL HIGH (ref 70–99)
Glucose-Capillary: 138 mg/dL — ABNORMAL HIGH (ref 70–99)
Glucose-Capillary: 154 mg/dL — ABNORMAL HIGH (ref 70–99)
Glucose-Capillary: 82 mg/dL (ref 70–99)
Glucose-Capillary: 99 mg/dL (ref 70–99)

## 2019-03-27 MED ORDER — MORPHINE SULFATE (PF) 2 MG/ML IV SOLN
1.0000 mg | INTRAVENOUS | Status: DC | PRN
  Administered 2019-03-29 – 2019-04-14 (×10): 1 mg via INTRAVENOUS
  Filled 2019-03-27 (×10): qty 1

## 2019-03-27 NOTE — Progress Notes (Signed)
PROGRESS NOTE  Micheal [name]  DOB: [birth year]  PCP: Gweneth Fritter, MD [id number]  DOA: 03/25/2019  LOS: 2 days   Brief narrative: Micheal [name] is a 85 y.o. male with Past medical history of COPD, CAD, type II DM, GERD, HTN, HLD, active smoker. Patient presented to the ED on 11/5 from home with complaint of abdominal pain, nausea and vomiting, last bowel movement 10/31. CT abdomen showed high-grade small bowel obstruction with transition point in the central lower abdomen potentially due to an adhesion.    He was admitted under general surgery service.  Medical consultation was obtained for comanagement of medical issues. Currently patient remains under conservative management for bowel obstruction.  Subjective: Patient was seen and examined this morning.  Pleasant elderly Caucasian male.  Continues to have distended belly.  NG tube apparently was clogged and was planned for removal and reinsertion.  Assessment/Plan: Small bowel obstruction -Likely due to postsurgical adhesions. -Currently under conservative management with IV fluid, n.p.o., pain medicines, NG tube decompression. -General surgery following.  May need exploratory laparotomy if fails conservative measures. -Mobilize encouraged.  Leukocytosis -WBC count was elevated to 28,000 on admission.  No focus of infection noted.  Gradually improving, 13.6 today.  AKI on CKD stage IIIb Acute hyponatremia -Creatinine 1.5-1.6 at baseline. -Presented with creatinine elevated to 2.14.  Sodium level was low at 129. -Both creatinine level and sodium level improved with IV hydration.  Continue the same.  T2DM -hemoglobin A1c 8.3. -Home meds include glipizide and Metformin.  Currently on hold. -Continue sliding scale insulin with Accu-Cheks.  Cardiovascular issues: HTN/HLD/CAD -Home meds include carvedilol, lisinopril, aspirin, statin. -Currently all of them on hold.  On metoprolol as needed IV.  COPD -Stable.   Continue inhalers.  GERD - PPI  Current daily smoker -Counseled to quit.  Nicotine patch offered.  BPH -continue Flomax.  Mobility: Encourage ambulation DVT prophylaxis:  Heparin subcu Code Status:   Code Status: Full Code  Family Communication:  Expected Discharge:  Pending clinical improvement  Consultants:  General surgery  Procedures:  None  Antimicrobials: Anti-infectives (From admission, onward)   None      Diet Order            Diet NPO time specified  Diet effective now              Infusions:  . lactated ringers 100 mL/hr at 03/27/19 1456    Scheduled Meds: . Chlorhexidine Gluconate Cloth  6 each Topical Daily  . heparin  5,000 Units Subcutaneous Q8H  . insulin aspart  0-15 Units Subcutaneous Q4H  . ipratropium-albuterol  3 mL Inhalation QID  . metoprolol tartrate  5 mg Intravenous Q6H  . mometasone-formoterol  2 puff Inhalation BID  . nicotine  14 mg Transdermal Daily  . pantoprazole (PROTONIX) IV  40 mg Intravenous QHS  . tamsulosin  0.4 mg Oral Daily    PRN meds: albuterol, ketorolac, morphine injection, ondansetron **OR** ondansetron (ZOFRAN) IV   Objective: Vitals:   03/27/19 1504 03/27/19 1619  BP:  131/68  Pulse:  98  Resp:    Temp:    SpO2: 92%     Intake/Output Summary (Last 24 hours) at 03/27/2019 1705 Last data filed at 03/27/2019 1504 Gross per 24 hour  Intake 2170.35 ml  Output 1250 ml  Net 920.35 ml   Filed Weights   03/25/19 1343  Weight: 63.5 kg   Weight change:  Body mass index is 21.93 kg/m.   Physical Exam:  General exam: Appears calm and comfortable.  Mild discomfort because of abdominal distention Skin: No rashes, lesions or ulcers. HEENT: Atraumatic, normocephalic, supple neck, no obvious bleeding Lungs: Clear to auscultate bilaterally CVS: Regular rate and rhythm, no murmur GI/Abd soft, distended, tympanic, bowel sound sluggish CNS: Alert, awake, oriented x3 Psychiatry: Mood appropriate Extremities:  No pedal edema, no calf tenderness  Data Review: I have personally reviewed the laboratory data and studies available.  Recent Labs  Lab 03/25/19 1235 03/26/19 0553 03/27/19 0544  WBC 27.9* 19.0* 13.6*  NEUTROABS  --  16.8* 11.7*  HGB 13.0 12.0* 11.3*  HCT 38.7* 36.7* 35.7*  MCV 89.8 92.0 93.9  PLT 295 226 217   Recent Labs  Lab 03/25/19 1235 03/26/19 0553 03/27/19 0544  NA 129* 139 138  K 4.2 3.7 3.7  CL 88* 93* 97*  CO2 24 33* 29  GLUCOSE 366* 98 108*  BUN 57* 51* 42*  CREATININE 2.14* 1.87* 1.66*  CALCIUM 9.2 9.2 8.4*  MG  --  2.2  --     Terrilee Croak, MD  [facility] 03/27/2019

## 2019-03-27 NOTE — Care Management Important Message (Signed)
Important Message  Patient Details  Name: Micheal [name] MRN: [id number] Date of Birth: December 20, 1932   Medicare Important Message Given:  Yes     Dannette Barbara 03/27/2019, 12:37 PM

## 2019-03-27 NOTE — Plan of Care (Signed)
  Problem: Health Behavior/Discharge Planning: Goal: Ability to manage health-related needs will improve Outcome: Progressing   NGT replaced. Functioning well. NPO status. Foley in place.

## 2019-03-27 NOTE — Progress Notes (Signed)
[name] SUR[facility]ICAL PROGRESS NOTE (cpt [phone])  Hospital Day(s): 2.   Interval History: Patient seen and examined, no acute events or new complaints overnight. Patient reports still with abdominal distension and soreness primarily in the lower abdomen and nausea intermittently, denies fever, chills, or emesis.  Leukocytosis improved to 13K Renal function trend towards baseline, U/O 1.4L KUB pending this AM NGT 50 - although after multiple flushes this appears to be clogged  Review of Systems:  Constitutional: denies fever, chills  HEENT: denies cough or congestion  Respiratory: denies any shortness of breath  Cardiovascular: denies chest pain or palpitations  Gastrointestinal: + abdominal pain, + distension, + Nausea, denied Vomitting, or diarrhea/and bowel function as per interval history Genitourinary: denies burning with urination or urinary frequency   Vital signs in last 24 hours: [min-max] current  Temp:  [97.8 F (36.6 C)-98.2 F (36.8 C)] 97.8 F (36.6 C) (11/06 0453) Pulse Rate:  [83-90] 87 (11/06 0453) Resp:  [16-20] 18 (11/06 0453) BP: (112-143)/(52-68) 112/54 (11/06 0453) SpO2:  [88 %-95 %] 92 % (11/06 0453)     Height: 5\' 7"  (170.2 cm) Weight: 63.5 kg BMI (Calculated): 21.92   Intake/Output last 2 shifts:  11/05 0701 - 11/06 0700 In: 2499.7 [I.V.:2499.7] Out: 1500 [Urine:1450; Emesis/NG output:50]   Physical Exam:  Constitutional: alert, cooperative and no distress  HENT: normocephalic without obvious abnormality  Eyes: PERRL, EOM's grossly intact and symmetric, NGT in place, appears to be clogged Respiratory: breathing non-labored at rest  Cardiovascular: regular rate and sinus rhythm  Gastrointestinal: soft, tenderness to lower abdomen, grossly distended, tympanic throughout, No rebound/no guarding. No peritonitis. Previous healed surgical scars present Musculoskeletal: no edema or wounds, motor and sensation grossly intact, NT    Labs:   CBC Latest Ref Rng & Units 03/27/2019 03/26/2019 03/25/2019  WBC 4.0 - 10.5 K/uL 13.6(H) 19.0(H) 27.9(H)  Hemoglobin 13.0 - 17.0 g/dL 11.3(L) 12.0(L) 13.0  Hematocrit 39.0 - 52.0 % 35.7(L) 36.7(L) 38.7(L)  Platelets 150 - 400 K/uL 217 226 295   CMP Latest Ref Rng & Units 03/27/2019 03/26/2019 03/25/2019  Glucose 70 - 99 mg/dL 108(H) 98 366(H)  BUN 8 - 23 mg/dL 42(H) 51(H) 57(H)  Creatinine 0.61 - 1.24 mg/dL 1.66(H) 1.87(H) 2.14(H)  Sodium 135 - 145 mmol/L 138 139 129(L)  Potassium 3.5 - 5.1 mmol/L 3.7 3.7 4.2  Chloride 98 - 111 mmol/L 97(L) 93(L) 88(L)  CO2 22 - 32 mmol/L 29 33(H) 24  Calcium 8.9 - 10.3 mg/dL 8.4(L) 9.2 9.2  Total Protein 6.5 - 8.1 g/dL - - 7.3  Total Bilirubin 0.3 - 1.2 mg/dL - - 1.0  Alkaline Phos 38 - 126 U/L - - 79  AST 15 - 41 U/L - - 23  ALT 0 - 44 U/L - - 10     Imaging studies:   KUB (03/27/2019) personally reviewed showing distended loops of small bowel, NGT in good position, and radiologist report reviewed   Assessment/Plan: (ICD-10's: K62.50) 85 y.o. male with improving leukocytosis and renal function but persistent SBO (NGT does not appear to be functioning correctly) most likely attributable to post-surgical adhesions although there is question of possible volvulus vs internal hernia on imaging but his abdominal examination and vitals are reassuring   - We will replace NGT this morning and assess output.IF the replacement does not appear to be functioning then he will likely require exploratory laparotomy, which he is understanding of and agreeable with.   - continue NPO + IVF   -  pain control prn (minimize narcotics); antiemetics prn             - monitor abdominal examination; on-going bowel function - Morning KUBs +/- gastrografin if does no signs of resolution in 24-48 hours - No indication for emergent surgery but he understands that if he fails conservative management he may need to undergo exploratory laparotomy.               - medical management of comorbidities; hold home medications for now - Mobilization encouraged  All of the above findings and recommendations were discussed with the patient, and the medical team, and all of patient's questions were answered to his expressed satisfaction.  -- Edison Simon, PA-C [facility] 03/27/2019, 7:26 AM [phone] M-F: 7am - 4pm

## 2019-03-28 ENCOUNTER — Inpatient Hospital Stay: Payer: Medicare Other

## 2019-03-28 LAB — GLUCOSE, CAPILLARY
Glucose-Capillary: 126 mg/dL — ABNORMAL HIGH (ref 70–99)
Glucose-Capillary: 106 mg/dL — ABNORMAL HIGH (ref 70–99)
Glucose-Capillary: 96 mg/dL (ref 70–99)
Glucose-Capillary: 98 mg/dL (ref 70–99)
Glucose-Capillary: 103 mg/dL — ABNORMAL HIGH (ref 70–99)
Glucose-Capillary: 100 mg/dL — ABNORMAL HIGH (ref 70–99)

## 2019-03-28 MED ORDER — IPRATROPIUM-ALBUTEROL 0.5-2.5 (3) MG/3ML IN SOLN: 3.0000 mL | RESPIRATORY_TRACT | Status: DC | PRN

## 2019-03-28 NOTE — Progress Notes (Signed)
PROGRESS NOTE  Micheal [name]  DOB: 1932-10-20  PCP: Gweneth Fritter, MD [id number]  DOA: 03/25/2019  LOS: 3 days   Brief narrative: Micheal [name] is a 85 y.o. male with Past medical history of COPD, CAD, type II DM, GERD, HTN, HLD, active smoker. Patient presented to the ED on 11/5 from home with complaint of abdominal pain, nausea and vomiting, last bowel movement 10/31. CT abdomen showed high-grade small bowel obstruction with transition point in the central lower abdomen potentially due to an adhesion.   He was admitted under general surgery service.  Medical consultation was obtained for comanagement of medical issues. Currently patient remains under conservative management for bowel obstruction.  Subjective: Patient was seen and examined this morning.  Pleasant elderly Caucasian male. Remains on conservative management for bowel obstruction.  NG tube attached to suction.  Passed gas this morning.  Normal gait.   Assessment/Plan: Small bowel obstruction -Likely due to postsurgical adhesions. -Currently under conservative management with IV fluid, n.p.o., pain medicines, NG tube decompression. -General surgery following.  May need exploratory laparotomy if fails conservative measures. -Encourage mobilization.  Leukocytosis -WBC count was elevated to 28,000 on admission.  No focus of infection noted.  Gradually improving, 13.6 on last check yesterday.  No fever.  Repeat blood work Architectural technologist.  AKI on CKD stage IIIb Acute hyponatremia -Creatinine 1.5-1.6 at baseline. -Presented with creatinine elevated to 2.14. Sodium level was low at 129. -Both creatinine level and sodium level improved with IV hydration. Continue IV hydration  T2DM -hemoglobin A1c 8.3. -Home meds include glipizide and Metformin.  Currently on hold. -Continue sliding scale insulin with Accu-Cheks.  Cardiovascular issues: HTN/HLD/CAD -Home meds include carvedilol, lisinopril, aspirin, statin.  -Currently all of them on hold. On metoprolol as needed IV.  COPD -Stable.  Continue inhalers.  GERD - PPI  Current daily smoker -Counseled to quit.  Nicotine patch offered.  BPH -continue Flomax.  Mobility: Encourage ambulation DVT prophylaxis:  Heparin subcu Code Status:   Code Status: Full Code  Family Communication:  Expected Discharge:  Pending clinical improvement  Consultants:  General surgery  Procedures:  None  Antimicrobials: Anti-infectives (From admission, onward)   None      Diet Order            Diet NPO time specified  Diet effective now              Infusions:  . lactated ringers 100 mL/hr at 03/28/19 1122    Scheduled Meds: . Chlorhexidine Gluconate Cloth  6 each Topical Daily  . heparin  5,000 Units Subcutaneous Q8H  . insulin aspart  0-15 Units Subcutaneous Q4H  . metoprolol tartrate  5 mg Intravenous Q6H  . mometasone-formoterol  2 puff Inhalation BID  . nicotine  14 mg Transdermal Daily  . pantoprazole (PROTONIX) IV  40 mg Intravenous QHS  . tamsulosin  0.4 mg Oral Daily    PRN meds: albuterol, ipratropium-albuterol, ketorolac, morphine injection, ondansetron **OR** ondansetron (ZOFRAN) IV   Objective: Vitals:   03/28/19 0911 03/28/19 1114  BP: (!) 148/72 (!) 153/66  Pulse: 80 87  Resp: (!) 22   Temp: 97.9 F (36.6 C) 98.6 F (37 C)  SpO2: 93% 100%    Intake/Output Summary (Last 24 hours) at 03/28/2019 1347 Last data filed at 03/28/2019 0501 Gross per 24 hour  Intake 2077.89 ml  Output 1350 ml  Net 727.89 ml   Filed Weights   03/25/19 1343  Weight: 63.5 kg   Weight  change:  Body mass index is 21.93 kg/m.   Physical Exam: General exam: Appears calm and comfortable than yesterday.   Skin: No rashes, lesions or ulcers. HEENT: Atraumatic, normocephalic, supple neck, no obvious bleeding Lungs: Clear to auscultate bilaterally CVS: Regular rate and rhythm, no murmur GI/Abd soft, less distended today, tympanic,  bowel sound minimal.   CNS: Alert, awake, oriented x3 Psychiatry: Mood appropriate Extremities: No pedal edema, no calf tenderness  Data Review: I have personally reviewed the laboratory data and studies available.  Recent Labs  Lab 03/25/19 1235 03/26/19 0553 03/27/19 0544  WBC 27.9* 19.0* 13.6*  NEUTROABS  --  16.8* 11.7*  HGB 13.0 12.0* 11.3*  HCT 38.7* 36.7* 35.7*  MCV 89.8 92.0 93.9  PLT 295 226 217   Recent Labs  Lab 03/25/19 1235 03/26/19 0553 03/27/19 0544  NA 129* 139 138  K 4.2 3.7 3.7  CL 88* 93* 97*  CO2 24 33* 29  GLUCOSE 366* 98 108*  BUN 57* 51* 42*  CREATININE 2.14* 1.87* 1.66*  CALCIUM 9.2 9.2 8.4*  MG  --  2.2  --     Terrilee Croak, MD  [facility] 03/28/2019

## 2019-03-28 NOTE — Progress Notes (Signed)
[facility] Hospital Day(s): 3.   Post op day(s):  Marland Kitchen   Interval History: Patient seen and examined, no acute events or new complaints overnight. Patient reports he is passing gas per rectum.  He reports that he feels a little bit more comfortable after NGT was changed yesterday.  He denies pain radiation.  There is no alleviating or aggravating factor.  He denies nausea or vomiting.  Vital signs in last 24 hours: [min-max] current  Temp:  [97.8 F (36.6 C)-98.3 F (36.8 C)] 97.9 F (36.6 C) (11/07 0911) Pulse Rate:  [78-100] 80 (11/07 0911) Resp:  [16-22] 22 (11/07 0911) BP: (119-148)/(53-79) 148/72 (11/07 0911) SpO2:  [92 %-96 %] 93 % (11/07 0911)     Height: 5\' 7"  (170.2 cm) Weight: 63.5 kg BMI (Calculated): 21.92   NGT: 600 mL feculent  Physical Exam:  Constitutional: alert, cooperative and no distress  Respiratory: breathing non-labored at rest  Cardiovascular: regular rate and sinus rhythm  Gastrointestinal: soft, non-tender.  Distended and tympanic.  No rebound tenderness  Labs:  CBC Latest Ref Rng & Units 03/27/2019 03/26/2019 03/25/2019  WBC 4.0 - 10.5 K/uL 13.6(H) 19.0(H) 27.9(H)  Hemoglobin 13.0 - 17.0 g/dL 11.3(L) 12.0(L) 13.0  Hematocrit 39.0 - 52.0 % 35.7(L) 36.7(L) 38.7(L)  Platelets 150 - 400 K/uL 217 226 295   CMP Latest Ref Rng & Units 03/27/2019 03/26/2019 03/25/2019  Glucose 70 - 99 mg/dL 108(H) 98 366(H)  BUN 8 - 23 mg/dL 42(H) 51(H) 57(H)  Creatinine 0.61 - 1.24 mg/dL 1.66(H) 1.87(H) 2.14(H)  Sodium 135 - 145 mmol/L 138 139 129(L)  Potassium 3.5 - 5.1 mmol/L 3.7 3.7 4.2  Chloride 98 - 111 mmol/L 97(L) 93(L) 88(L)  CO2 22 - 32 mmol/L 29 33(H) 24  Calcium 8.9 - 10.3 mg/dL 8.4(L) 9.2 9.2  Total Protein 6.5 - 8.1 g/dL - - 7.3  Total Bilirubin 0.3 - 1.2 mg/dL - - 1.0  Alkaline Phos 38 - 126 U/L - - 79  AST 15 - 41 U/L - - 23  ALT 0 - 44 U/L - - 10    Imaging studies: I evaluated today's abdominal x-ray.  Patient with persistent small bowel  dilation.  There is no free air.   Assessment/Plan:  85 y.o. male with small bowel obstruction, complicated by pertinent comorbidities including coronary artery disease, COPD, GERD, hypercholesterolemia, hypertension. Patient continue with bowel obstruction.  Even though he reports that he is passing gas, the abdomen is still distended and the x-ray shows small bowel dilation.  He is also draining significant amount per NG tube.  We will continue NG tube to suction.  Renal function slowly improving.  Patient with a Foley catheter due to urinary retention.  No significant electrolyte disturbance.  Encourage the patient to ambulate.  We will continue with DVT prophylaxis.  We will continue with conservative management at this moment.  Patient understand that if he does not improve with nasogastric tube plan bowel rest he may need surgical management.  Arnold Long, MD

## 2019-03-29 LAB — CBC WITH DIFFERENTIAL/PLATELET
Abs Immature Granulocytes: 0.05 10*3/uL (ref 0.00–0.07)
Basophils Absolute: 0 10*3/uL (ref 0.0–0.1)
Basophils Relative: 1 %
Eosinophils Absolute: 0 10*3/uL (ref 0.0–0.5)
Eosinophils Relative: 0 %
HCT: 36.7 % — ABNORMAL LOW (ref 39.0–52.0)
Hemoglobin: 12.2 g/dL — ABNORMAL LOW (ref 13.0–17.0)
Immature Granulocytes: 1 %
Lymphocytes Relative: 13 %
Lymphs Abs: 0.8 10*3/uL (ref 0.7–4.0)
MCH: 30.3 pg (ref 26.0–34.0)
MCHC: 33.2 g/dL (ref 30.0–36.0)
MCV: 91.1 fL (ref 80.0–100.0)
Monocytes Absolute: 0.8 10*3/uL (ref 0.1–1.0)
Monocytes Relative: 13 %
Neutro Abs: 4.4 10*3/uL (ref 1.7–7.7)
Neutrophils Relative %: 72 %
Platelets: 221 10*3/uL (ref 150–400)
RBC: 4.03 MIL/uL — ABNORMAL LOW (ref 4.22–5.81)
RDW: 13.8 % (ref 11.5–15.5)
WBC: 6 10*3/uL (ref 4.0–10.5)
nRBC: 0 % (ref 0.0–0.2)

## 2019-03-29 LAB — GLUCOSE, CAPILLARY
Glucose-Capillary: 101 mg/dL — ABNORMAL HIGH (ref 70–99)
Glucose-Capillary: 104 mg/dL — ABNORMAL HIGH (ref 70–99)
Glucose-Capillary: 110 mg/dL — ABNORMAL HIGH (ref 70–99)
Glucose-Capillary: 125 mg/dL — ABNORMAL HIGH (ref 70–99)
Glucose-Capillary: 126 mg/dL — ABNORMAL HIGH (ref 70–99)
Glucose-Capillary: 129 mg/dL — ABNORMAL HIGH (ref 70–99)

## 2019-03-29 LAB — BASIC METABOLIC PANEL
Anion gap: 14 (ref 5–15)
BUN: 32 mg/dL — ABNORMAL HIGH (ref 8–23)
CO2: 27 mmol/L (ref 22–32)
Calcium: 8.5 mg/dL — ABNORMAL LOW (ref 8.9–10.3)
Chloride: 98 mmol/L (ref 98–111)
Creatinine, Ser: 1.45 mg/dL — ABNORMAL HIGH (ref 0.61–1.24)
GFR calc Af Amer: 51 mL/min — ABNORMAL LOW (ref >60)
GFR calc non Af Amer: 44 mL/min — ABNORMAL LOW (ref >60)
Glucose, Bld: 111 mg/dL — ABNORMAL HIGH (ref 70–99)
Potassium: 4.2 mmol/L (ref 3.5–5.1)
Sodium: 139 mmol/L (ref 135–145)

## 2019-03-29 NOTE — Progress Notes (Signed)
PROGRESS NOTE  Micheal [name]  DOB: [birth year]  PCP: Gweneth Fritter, MD [id number]  DOA: 03/25/2019  LOS: 4 days   Brief narrative: Micheal [name] is a 85 y.o. male with Past medical history of COPD, CAD, type II DM, GERD, HTN, HLD, active smoker. Patient presented to the ED on 11/5 from home with complaint of abdominal pain, nausea and vomiting, last bowel movement 10/31. CT abdomen showed high-grade small bowel obstruction with transition point in the central lower abdomen potentially due to an adhesion.   He was admitted under general surgery service.  Medical consultation was obtained for comanagement of medical issues. Currently patient remains under conservative management for bowel obstruction.  Subjective: Patient was seen and examined this morning.  Pleasant elderly Caucasian male.  Continues to feel uncomfortable in the abdomen. Remains on conservative management for bowel obstruction.  NG tube attached to suction.  He says he has been passing some gas.  He still seems to have distended abdomen.   Assessment/Plan: Small bowel obstruction -Likely due to postsurgical adhesions. -Currently under conservative management with IV fluid, n.p.o., pain medicines, NG tube decompression. -Does not seem to be improving.  Continues to have distended abdomen and sluggish bowel sounds. -General surgery following.  May need exploratory laparotomy if fails conservative measures. -Encourage mobilization.  Leukocytosis -WBC count was elevated to 28,000 on admission.  No focus of infection noted.  Gradually improving, white blood cell count back to normal.  AKI on CKD stage IIIb Acute hyponatremia -Creatinine 1.5-1.6 at baseline. -Presented with creatinine elevated to 2.14. Sodium level was low at 129. -Both creatinine level and sodium level improved down to normal range with IV hydration. Continue IV hydration  T2DM -hemoglobin A1c 8.3. -Home meds include glipizide and Metformin.   Currently on hold. -Continue sliding scale insulin with Accu-Cheks.  Cardiovascular issues: HTN/HLD/CAD -Home meds include carvedilol, lisinopril, aspirin, statin. -Currently all of them on hold. On metoprolol as needed IV.  COPD -Stable.  Continue inhalers.  GERD - PPI  Current daily smoker -Counseled to quit.  Nicotine patch offered.  BPH -continue Flomax.  Mobility: Encourage ambulation DVT prophylaxis:  Heparin subcu Code Status:   Code Status: Full Code  Family Communication:  Expected Discharge:  Pending clinical improvement.  Medical surgical intervention.  General surgery to decide.  Consultants:  General surgery  Procedures:  None  Antimicrobials: Anti-infectives (From admission, onward)   None      Diet Order            Diet NPO time specified Except for: Sips with Meds  Diet effective now              Infusions:  . lactated ringers 100 mL/hr at 03/29/19 0500    Scheduled Meds: . Chlorhexidine Gluconate Cloth  6 each Topical Daily  . heparin  5,000 Units Subcutaneous Q8H  . insulin aspart  0-15 Units Subcutaneous Q4H  . metoprolol tartrate  5 mg Intravenous Q6H  . mometasone-formoterol  2 puff Inhalation BID  . nicotine  14 mg Transdermal Daily  . pantoprazole (PROTONIX) IV  40 mg Intravenous QHS  . tamsulosin  0.4 mg Oral Daily    PRN meds: albuterol, ipratropium-albuterol, ketorolac, morphine injection, ondansetron **OR** ondansetron (ZOFRAN) IV   Objective: Vitals:   03/29/19 0044 03/29/19 0420  BP: (!) 141/67 131/72  Pulse: 97 92  Resp:  18  Temp:  (!) 97.5 F (36.4 C)  SpO2: 97% 96%    Intake/Output Summary (Last 24  hours) at 03/29/2019 1140 Last data filed at 03/29/2019 0758 Gross per 24 hour  Intake 1935.56 ml  Output 750 ml  Net 1185.56 ml   Filed Weights   03/25/19 1343  Weight: 63.5 kg   Weight change:  Body mass index is 21.93 kg/m.   Physical Exam: General exam: Appears calm, mild distress from continuous  abdominal discomfort. Skin: No rashes, lesions or ulcers. HEENT: Atraumatic, normocephalic, supple neck, no obvious bleeding Lungs: Clear to auscultate bilaterally CVS: Regular rate and rhythm, no murmur GI/Abd soft, distended, tympanic, bowel sounds sluggish.   CNS: Alert, awake, oriented x3 Psychiatry: Mood appropriate Extremities: No pedal edema, no calf tenderness  Data Review: I have personally reviewed the laboratory data and studies available.  Recent Labs  Lab 03/25/19 1235 03/26/19 0553 03/27/19 0544 03/29/19 0435  WBC 27.9* 19.0* 13.6* 6.0  NEUTROABS  --  16.8* 11.7* 4.4  HGB 13.0 12.0* 11.3* 12.2*  HCT 38.7* 36.7* 35.7* 36.7*  MCV 89.8 92.0 93.9 91.1  PLT 295 226 217 221   Recent Labs  Lab 03/25/19 1235 03/26/19 0553 03/27/19 0544 03/29/19 0435  NA 129* 139 138 139  K 4.2 3.7 3.7 4.2  CL 88* 93* 97* 98  CO2 24 33* 29 27  GLUCOSE 366* 98 108* 111*  BUN 57* 51* 42* 32*  CREATININE 2.14* 1.87* 1.66* 1.45*  CALCIUM 9.2 9.2 8.4* 8.5*  MG  --  2.2  --   --     Terrilee Croak, MD  [facility] 03/29/2019

## 2019-03-29 NOTE — Plan of Care (Signed)
Patient doing well.  Patient is not having much pain.  He is passing occasional flatus.  No BM yet.  NG with small amount of drainage.  Foley draining well.  Patient has ambulated 3 laps in the hallway thus far.  No significant changes.

## 2019-03-29 NOTE — Progress Notes (Signed)
[facility] Day(s): 4.   Post op day(s):  Marland Kitchen   Interval History: Patient seen and examined, no acute events or new complaints overnight. Patient reports he passed 3 flatus yesterday, denies nausea or vomiting.  Patient denies abdominal pain.  There is no pain radiation.  There is no alleviating or aggravating factor.  Vital signs in last 24 hours: [min-max] current  Temp:  [97.5 F (36.4 C)-98.6 F (37 C)] 97.5 F (36.4 C) (11/08 0420) Pulse Rate:  [87-97] 92 (11/08 0420) Resp:  [16-18] 18 (11/08 0420) BP: (131-154)/(66-76) 131/72 (11/08 0420) SpO2:  [96 %-100 %] 96 % (11/08 0420)     Height: 5\' 7"  (170.2 cm) Weight: 63.5 kg BMI (Calculated): 21.92   NGT: Not charted  Physical Exam:  Constitutional: alert, cooperative and no distress  Respiratory: breathing non-labored at rest  Cardiovascular: regular rate and sinus rhythm  Gastrointestinal: soft, non-tender, and distended  Labs:  CBC Latest Ref Rng & Units 03/29/2019 03/27/2019 03/26/2019  WBC 4.0 - 10.5 K/uL 6.0 13.6(H) 19.0(H)  Hemoglobin 13.0 - 17.0 g/dL 12.2(L) 11.3(L) 12.0(L)  Hematocrit 39.0 - 52.0 % 36.7(L) 35.7(L) 36.7(L)  Platelets 150 - 400 K/uL 221 217 226   CMP Latest Ref Rng & Units 03/29/2019 03/27/2019 03/26/2019  Glucose 70 - 99 mg/dL 111(H) 108(H) 98  BUN 8 - 23 mg/dL 32(H) 42(H) 51(H)  Creatinine 0.61 - 1.24 mg/dL 1.45(H) 1.66(H) 1.87(H)  Sodium 135 - 145 mmol/L 139 138 139  Potassium 3.5 - 5.1 mmol/L 4.2 3.7 3.7  Chloride 98 - 111 mmol/L 98 97(L) 93(L)  CO2 22 - 32 mmol/L 27 29 33(H)  Calcium 8.9 - 10.3 mg/dL 8.5(L) 8.4(L) 9.2  Total Protein 6.5 - 8.1 g/dL - - -  Total Bilirubin 0.3 - 1.2 mg/dL - - -  Alkaline Phos 38 - 126 U/L - - -  AST 15 - 41 U/L - - -  ALT 0 - 44 U/L - - -    Imaging studies: No new pertinent imaging studies   Assessment/Plan:  85 y.o. male with small bowel obstruction, complicated by pertinent comorbidities including coronary artery disease, COPD, GERD,  hypercholesterolemia, hypertension. Patient continue with small bowel obstruction.  Patient with nasogastric tube in place.  We will continue with NGT decompression.  We will continue with IV fluids.  If small bowel surgeon does not resolve in the next 24 hours might need to consider surgical management.  There is improving renal function.  There is no significant electrolyte disturbance at this moment.  There is no leukocytosis and no acidosis.  We will continue optimizing in case patient needs surgical management.  Appreciate hospitalist management and optimization.  I discussed with patient the possibility of surgery and wants to try to avoid.  I voiced that I understand that we will try not to do surgery but if needed will need to consider for resolution of the small bowel obstruction.  Arnold Long, MD

## 2019-03-30 ENCOUNTER — Encounter
Admission: EM | Disposition: A | Discharge status: Home / Self Care | Payer: Self-pay | Source: Home / Self Care | Attending: Surgery

## 2019-03-30 ENCOUNTER — Encounter: Payer: Self-pay | Admitting: Anesthesiology

## 2019-03-30 ENCOUNTER — Inpatient Hospital Stay: Payer: Self-pay

## 2019-03-30 ENCOUNTER — Inpatient Hospital Stay: Payer: Medicare Other | Admitting: Certified Registered"

## 2019-03-30 ENCOUNTER — Other Ambulatory Visit: Payer: Self-pay

## 2019-03-30 ENCOUNTER — Inpatient Hospital Stay: Payer: Medicare Other

## 2019-03-30 DIAGNOSIS — K56609 Unspecified intestinal obstruction, unspecified as to partial versus complete obstruction: Secondary | ICD-10-CM

## 2019-03-30 HISTORY — PX: LAPAROTOMY: SHX154

## 2019-03-30 LAB — CBC WITH DIFFERENTIAL/PLATELET
Abs Immature Granulocytes: 0.04 10*3/uL (ref 0.00–0.07)
Basophils Absolute: 0 10*3/uL (ref 0.0–0.1)
Basophils Relative: 1 %
Eosinophils Absolute: 0.1 10*3/uL (ref 0.0–0.5)
Eosinophils Relative: 1 %
HCT: 34.8 % — ABNORMAL LOW (ref 39.0–52.0)
Hemoglobin: 11.3 g/dL — ABNORMAL LOW (ref 13.0–17.0)
Immature Granulocytes: 1 %
Lymphocytes Relative: 21 %
Lymphs Abs: 0.8 10*3/uL (ref 0.7–4.0)
MCH: 30.1 pg (ref 26.0–34.0)
MCHC: 32.5 g/dL (ref 30.0–36.0)
MCV: 92.8 fL (ref 80.0–100.0)
Monocytes Absolute: 0.7 10*3/uL (ref 0.1–1.0)
Monocytes Relative: 19 %
Neutro Abs: 2.1 10*3/uL (ref 1.7–7.7)
Neutrophils Relative %: 57 %
Platelets: 225 10*3/uL (ref 150–400)
RBC: 3.75 MIL/uL — ABNORMAL LOW (ref 4.22–5.81)
RDW: 14 % (ref 11.5–15.5)
Smear Review: NORMAL
WBC: 3.8 10*3/uL — ABNORMAL LOW (ref 4.0–10.5)
nRBC: 0 % (ref 0.0–0.2)

## 2019-03-30 LAB — BASIC METABOLIC PANEL
Anion gap: 15 (ref 5–15)
BUN: 27 mg/dL — ABNORMAL HIGH (ref 8–23)
CO2: 24 mmol/L (ref 22–32)
Calcium: 8.1 mg/dL — ABNORMAL LOW (ref 8.9–10.3)
Chloride: 97 mmol/L — ABNORMAL LOW (ref 98–111)
Creatinine, Ser: 1.41 mg/dL — ABNORMAL HIGH (ref 0.61–1.24)
GFR calc Af Amer: 53 mL/min — ABNORMAL LOW (ref >60)
GFR calc non Af Amer: 45 mL/min — ABNORMAL LOW (ref >60)
Glucose, Bld: 121 mg/dL — ABNORMAL HIGH (ref 70–99)
Potassium: 4.1 mmol/L (ref 3.5–5.1)
Sodium: 136 mmol/L (ref 135–145)

## 2019-03-30 LAB — TYPE AND SCREEN
ABO/RH(D): A POS
Antibody Screen: NEGATIVE

## 2019-03-30 LAB — GLUCOSE, CAPILLARY
Glucose-Capillary: 107 mg/dL — ABNORMAL HIGH (ref 70–99)
Glucose-Capillary: 109 mg/dL — ABNORMAL HIGH (ref 70–99)
Glucose-Capillary: 124 mg/dL — ABNORMAL HIGH (ref 70–99)
Glucose-Capillary: 124 mg/dL — ABNORMAL HIGH (ref 70–99)
Glucose-Capillary: 137 mg/dL — ABNORMAL HIGH (ref 70–99)
Glucose-Capillary: 212 mg/dL — ABNORMAL HIGH (ref 70–99)
Glucose-Capillary: 88 mg/dL (ref 70–99)

## 2019-03-30 LAB — HEPATIC FUNCTION PANEL
ALT: 6 U/L (ref 0–44)
AST: 12 U/L — ABNORMAL LOW (ref 15–41)
Albumin: 2.8 g/dL — ABNORMAL LOW (ref 3.5–5.0)
Alkaline Phosphatase: 62 U/L (ref 38–126)
Bilirubin, Direct: 0.2 mg/dL (ref 0.0–0.2)
Indirect Bilirubin: 1.4 mg/dL — ABNORMAL HIGH (ref 0.3–0.9)
Total Bilirubin: 1.6 mg/dL — ABNORMAL HIGH (ref 0.3–1.2)
Total Protein: 5.6 g/dL — ABNORMAL LOW (ref 6.5–8.1)

## 2019-03-30 LAB — MAGNESIUM: Magnesium: 1.7 mg/dL (ref 1.7–2.4)

## 2019-03-30 LAB — LACTIC ACID, PLASMA: Lactic Acid, Venous: 0.8 mmol/L (ref 0.5–1.9)

## 2019-03-30 LAB — PHOSPHORUS: Phosphorus: 2.8 mg/dL (ref 2.5–4.6)

## 2019-03-30 SURGERY — LAPAROTOMY, EXPLORATORY
Anesthesia: General

## 2019-03-30 MED ORDER — FAT EMULSION PLANT BASED 20 % IV EMUL
250.0000 mL | INTRAVENOUS | Status: AC
  Administered 2019-03-30: 250 mL via INTRAVENOUS
  Filled 2019-03-30: qty 250

## 2019-03-30 MED ORDER — MAGNESIUM SULFATE 2 GM/50ML IV SOLN
2.0000 g | Freq: Once | INTRAVENOUS | Status: AC
  Administered 2019-03-30: 2 g via INTRAVENOUS
  Filled 2019-03-30: qty 50

## 2019-03-30 MED ORDER — EPHEDRINE SULFATE 50 MG/ML IJ SOLN
INTRAMUSCULAR | Status: DC | PRN
  Administered 2019-03-30: 5 mg via INTRAVENOUS

## 2019-03-30 MED ORDER — HYDRALAZINE HCL 20 MG/ML IJ SOLN: 10.0000 mg | Freq: Four times a day (QID) | INTRAMUSCULAR | Status: DC | PRN

## 2019-03-30 MED ORDER — BUPIVACAINE HCL (PF) 0.25 % IJ SOLN
INTRAMUSCULAR | Status: AC
  Filled 2019-03-30: qty 30

## 2019-03-30 MED ORDER — FENTANYL CITRATE (PF) 100 MCG/2ML IJ SOLN
INTRAMUSCULAR | Status: DC | PRN
  Administered 2019-03-30 (×2): 50 ug via INTRAVENOUS

## 2019-03-30 MED ORDER — FENTANYL CITRATE (PF) 100 MCG/2ML IJ SOLN
INTRAMUSCULAR | Status: AC
  Filled 2019-03-30: qty 2

## 2019-03-30 MED ORDER — ONDANSETRON HCL 4 MG/2ML IJ SOLN
INTRAMUSCULAR | Status: DC | PRN
  Administered 2019-03-30: 4 mg via INTRAVENOUS

## 2019-03-30 MED ORDER — MIDAZOLAM HCL 2 MG/2ML IJ SOLN
INTRAMUSCULAR | Status: DC | PRN
  Administered 2019-03-30: 2 mg via INTRAVENOUS

## 2019-03-30 MED ORDER — MIDAZOLAM HCL 2 MG/2ML IJ SOLN
INTRAMUSCULAR | Status: AC
  Filled 2019-03-30: qty 2

## 2019-03-30 MED ORDER — KETOROLAC TROMETHAMINE 30 MG/ML IJ SOLN
15.0000 mg | Freq: Four times a day (QID) | INTRAMUSCULAR | Status: AC | PRN
  Administered 2019-03-30: 15 mg via INTRAVENOUS
  Filled 2019-03-30: qty 1

## 2019-03-30 MED ORDER — SUGAMMADEX SODIUM 200 MG/2ML IV SOLN
INTRAVENOUS | Status: DC | PRN
  Administered 2019-03-30: 200 mg via INTRAVENOUS

## 2019-03-30 MED ORDER — FENTANYL CITRATE (PF) 100 MCG/2ML IJ SOLN
INTRAMUSCULAR | Status: AC
  Administered 2019-03-30: 19:00:00 25 ug via INTRAVENOUS
  Filled 2019-03-30: qty 2

## 2019-03-30 MED ORDER — TRACE MINERALS CU-MN-SE-ZN 300-55-60-3000 MCG/ML IV SOLN: INTRAVENOUS | Status: DC

## 2019-03-30 MED ORDER — ROCURONIUM BROMIDE 100 MG/10ML IV SOLN
INTRAVENOUS | Status: DC | PRN
  Administered 2019-03-30: 50 mg via INTRAVENOUS

## 2019-03-30 MED ORDER — SUCCINYLCHOLINE CHLORIDE 20 MG/ML IJ SOLN
INTRAMUSCULAR | Status: DC | PRN
  Administered 2019-03-30: 120 mg via INTRAVENOUS

## 2019-03-30 MED ORDER — BUPIVACAINE LIPOSOME 1.3 % IJ SUSP
INTRAMUSCULAR | Status: AC
  Filled 2019-03-30: qty 20

## 2019-03-30 MED ORDER — SODIUM CHLORIDE 0.9 % IV SOLN
INTRAVENOUS | Status: DC | PRN
  Administered 2019-03-30: 30 mL

## 2019-03-30 MED ORDER — EPINEPHRINE PF 1 MG/ML IJ SOLN
INTRAMUSCULAR | Status: AC
  Filled 2019-03-30: qty 1

## 2019-03-30 MED ORDER — FENTANYL CITRATE (PF) 100 MCG/2ML IJ SOLN
25.0000 ug | INTRAMUSCULAR | Status: DC | PRN
  Administered 2019-03-30 (×5): 25 ug via INTRAVENOUS

## 2019-03-30 MED ORDER — ESMOLOL HCL 100 MG/10ML IV SOLN
INTRAVENOUS | Status: DC | PRN
  Administered 2019-03-30: 10 mg via INTRAVENOUS

## 2019-03-30 MED ORDER — ONDANSETRON HCL 4 MG/2ML IJ SOLN: 4.0000 mg | Freq: Once | INTRAMUSCULAR | Status: DC | PRN

## 2019-03-30 MED ORDER — POLYVINYL ALCOHOL 1.4 % OP SOLN
1.0000 [drp] | Freq: Two times a day (BID) | OPHTHALMIC | Status: DC
  Administered 2019-03-30 – 2019-04-15 (×32): 1 [drp] via OPHTHALMIC
  Filled 2019-03-30: qty 15

## 2019-03-30 MED ORDER — SODIUM CHLORIDE 0.9 % IV SOLN
1.0000 g | INTRAVENOUS | Status: AC
  Administered 2019-03-30: 1 g via INTRAVENOUS
  Filled 2019-03-30: qty 1

## 2019-03-30 MED ORDER — PHENYLEPHRINE HCL (PRESSORS) 10 MG/ML IV SOLN
INTRAVENOUS | Status: DC | PRN
  Administered 2019-03-30 (×2): 200 ug via INTRAVENOUS
  Administered 2019-03-30: 300 ug via INTRAVENOUS
  Administered 2019-03-30: 100 ug via INTRAVENOUS
  Administered 2019-03-30 (×2): 200 ug via INTRAVENOUS
  Administered 2019-03-30: 100 ug via INTRAVENOUS
  Administered 2019-03-30 (×3): 200 ug via INTRAVENOUS

## 2019-03-30 MED ORDER — SODIUM CHLORIDE FLUSH 0.9 % IV SOLN
INTRAVENOUS | Status: AC
  Filled 2019-03-30: qty 10

## 2019-03-30 MED ORDER — TRACE MINERALS CU-MN-SE-ZN 300-55-60-3000 MCG/ML IV SOLN
INTRAVENOUS | Status: AC
  Administered 2019-03-30: 20:00:00 via INTRAVENOUS
  Filled 2019-03-30: qty 960

## 2019-03-30 MED ORDER — PROPOFOL 10 MG/ML IV BOLUS
INTRAVENOUS | Status: DC | PRN
  Administered 2019-03-30: 70 mg via INTRAVENOUS

## 2019-03-30 MED ORDER — SODIUM CHLORIDE 0.9% FLUSH
10.0000 mL | INTRAVENOUS | Status: DC | PRN
  Administered 2019-04-12: 10 mL
  Administered 2019-04-14: 40 mL
  Filled 2019-03-30 (×2): qty 40

## 2019-03-30 MED ORDER — BUPIVACAINE-EPINEPHRINE (PF) 0.25% -1:200000 IJ SOLN
INTRAMUSCULAR | Status: DC | PRN
  Administered 2019-03-30: 30 mL

## 2019-03-30 MED ORDER — ESMOLOL HCL 100 MG/10ML IV SOLN
INTRAVENOUS | Status: AC
  Filled 2019-03-30: qty 10

## 2019-03-30 SURGICAL SUPPLY — 35 items
APPLICATOR CHLORAPREP 10.5 ORG (MISCELLANEOUS) ×3 IMPLANT
CANISTER SUCT 1200ML W/VALVE (MISCELLANEOUS) ×3 IMPLANT
COVER WAND RF STERILE (DRAPES) ×3 IMPLANT
DRAPE LAPAROTOMY 100X77 ABD (DRAPES) ×3 IMPLANT
DRSG OPSITE POSTOP 4X12 (GAUZE/BANDAGES/DRESSINGS) ×3 IMPLANT
DRSG OPSITE POSTOP 4X8 (GAUZE/BANDAGES/DRESSINGS) ×3 IMPLANT
DRSG TEGADERM 4X10 (GAUZE/BANDAGES/DRESSINGS) ×3 IMPLANT
ELECT CAUTERY BLADE 6.4 (BLADE) ×3 IMPLANT
ELECT REM PT RETURN 9FT ADLT (ELECTROSURGICAL) ×3
ELECTRODE REM PT RTRN 9FT ADLT (ELECTROSURGICAL) ×1 IMPLANT
GAUZE SPONGE 4X4 12PLY STRL (GAUZE/BANDAGES/DRESSINGS) ×3 IMPLANT
GLOVE SURG SYN 7.0 (GLOVE) ×6 IMPLANT
GLOVE SURG SYN 7.5  E (GLOVE) ×4
GLOVE SURG SYN 7.5 E (GLOVE) ×2 IMPLANT
GOWN STRL REUS W/ TWL LRG LVL3 (GOWN DISPOSABLE) ×4 IMPLANT
GOWN STRL REUS W/TWL LRG LVL3 (GOWN DISPOSABLE) ×8
LABEL OR SOLS (LABEL) ×3 IMPLANT
LIGASURE IMPACT 36 18CM CVD LR (INSTRUMENTS) ×3 IMPLANT
NEEDLE HYPO 22GX1.5 SAFETY (NEEDLE) ×3 IMPLANT
NS IRRIG 1000ML POUR BTL (IV SOLUTION) ×3 IMPLANT
PACK BASIN MAJOR ARMC (MISCELLANEOUS) ×3 IMPLANT
PACK COLON CLEAN CLOSURE (MISCELLANEOUS) ×3 IMPLANT
RETRACTOR WND ALEXIS-O 25 LRG (MISCELLANEOUS) ×1 IMPLANT
RTRCTR WOUND ALEXIS O 25CM LRG (MISCELLANEOUS) ×3
SEPRAFILM MEMBRANE 5X6 (MISCELLANEOUS) ×3 IMPLANT
STAPLER SKIN PROX 35W (STAPLE) ×3 IMPLANT
SUT PDS AB 1 CT1 36 (SUTURE) ×3 IMPLANT
SUT PROLENE 0 CT 1 30 (SUTURE) ×9 IMPLANT
SUT SILK 2 0 (SUTURE) ×2
SUT SILK 2-0 18XBRD TIE 12 (SUTURE) ×1 IMPLANT
SUT SILK 3-0 (SUTURE) ×3 IMPLANT
SUT VIC AB 3-0 SH 27 (SUTURE) ×2
SUT VIC AB 3-0 SH 27X BRD (SUTURE) ×1 IMPLANT
SYR 10ML LL (SYRINGE) ×3 IMPLANT
TRAY FOLEY MTR SLVR 16FR STAT (SET/KITS/TRAYS/PACK) ×3 IMPLANT

## 2019-03-30 NOTE — Care Management Important Message (Signed)
Important Message  Patient Details  Name: Micheal [name] MRN: [id number] Date of Birth: [birth year]   Medicare Important Message Given:  Yes     Dannette Barbara 03/30/2019, 12:32 PM

## 2019-03-30 NOTE — Progress Notes (Addendum)
Initial Nutrition Assessment  DOCUMENTATION CODES:   Not applicable  INTERVENTION:  Initiate Clinimix E5/15 at 40 mL/hr   Patient at high risk for refeeding syndrome Monitor Potassium, Magnesium and phosphorus until stable.  ILE of 252mL daily x 12 hrs  Goal rate of 66mL/hr (Provides 1863 calories and 96 grams of protein)  Keep total IVF + TPN at 162ml/hr per MD  MVI with trace minerals added  100 mg Thiamin added daily  Daily weight   NUTRITION DIAGNOSIS:   Inadequate oral intake related to acute illness(SBO) as evidenced by NPO status.   GOAL:   Patient will meet greater than or equal to 90% of their needs   MONITOR:   PO intake, Diet advancement, Labs, Weight trends, Skin, I & O's  REASON FOR ASSESSMENT:   NPO/Clear Liquid Diet    ASSESSMENT:   85 yo male Adm for SBO. PMH significant of DM, COPD, CAD, GERD, MI(2005), HTN, Hypercholestemia.  Pt presents with SBO confirmed by CT scan and is scheduled for exploratory laparotomy this afternoon. Denies any abdominal pain or discomfort. Pt states that he has been passing flatus and has been feeling GI movement.  Pt endorses that his appetite was good PTA, usually eats breakfast of an egg sandwich and often won't eat again until dinner which consists of meat, starch and vegetables. Admits to prior alcohol use but states that he stopped using approximately 6 months ago. Pt explained several falls and coordination issues within the past few months, and there is a noted balance problem in his PMH. This could be attributed to alcohol use.  Pt has NG tube for suction. States that his weight has not changed recently and this is confirmed by his stable weight history. No weight taken since admission, daily weight ordered.  Pt is anxious to be able to eat PO verbally expressed his understanding of TPN and why it is necessary. Expressed an interest in chocolate Ensure when it is permissible for him to eat PO. Medications  reviewed: Heparin; Insulin; Lactated ringers infusion  Labs reviewed: Potassium-normal; Sodium-normal; Glucose- high, but acceptable-124; phosphorus-normal; magnesium-normal    NUTRITION - FOCUSED PHYSICAL EXAM:    Most Recent Value  Orbital Region  Mild depletion  Upper Arm Region  Mild depletion  Thoracic and Lumbar Region  Mild depletion  Buccal Region  No depletion  Temple Region  No depletion  Clavicle Bone Region  Moderate depletion  Clavicle and Acromion Bone Region  Moderate depletion  Scapular Bone Region  Mild depletion  Dorsal Hand  Mild depletion  Patellar Region  Mild depletion  Anterior Thigh Region  Moderate depletion  Posterior Calf Region  Moderate depletion  Edema (RD Assessment)  None  Hair  Reviewed  Eyes  Reviewed  Mouth  Reviewed  Skin  Reviewed  Nails  Reviewed       Diet Order:   Diet Order            Diet NPO time specified Except for: Sips with Meds  Diet effective now              EDUCATION NEEDS:   Education needs have been addressed  Skin:  Skin Assessment: Reviewed RN Assessment  Last BM:  10/31  Height:   Ht Readings from Last 1 Encounters:  03/25/19 5\' 7"  (1.702 m)    Weight:   Wt Readings from Last 1 Encounters:  03/25/19 63.5 kg    Ideal Body Weight:  67.3 kg  BMI:  Body mass  index is 21.93 kg/m.  Estimated Nutritional Needs:   Kcal:  1700-1900  Protein:  85-95 grams  Fluid:  1.6-1.8 L   Meda Klinefelter, Dietetic Intern

## 2019-03-30 NOTE — Evaluation (Signed)
Physical Therapy Evaluation Patient Details Name: Micheal [name] MRN: [id number] DOB: 1932/05/25 Today's Date: 03/30/2019   History of Present Illness  Pt is an 85 y.o. male presenting to hospital 11/4 with several days of generalized abdominal discomfort, vomiting, and nausea.  Pt admitted with SBO.  PMH includes h/o balance problem, COPD, DM, htn, MI, PNA, cardiac cath, ORIF L ankle fx 01/10/18, L distal radius fx 01/10/19, anterolateral R 9th and 10th rib fx's 11/27/18.  Clinical Impression  Prior to hospital admission, pt was independent.  Pt lives with his wife in 1 level home.  Currently pt is min assist semi-supine to sit; CGA with transfers; and CGA ambulating around nursing loop 3x's (no AD).  Pt initially with more cautious gait and appearing mildly unsteady (pt able to self correct but CGA provided for safety) but improved balance and gait technique noted with increased distance ambulated (see below for details).  O2 sats (on room air) and HR WFL during session's activities.  Pt would benefit from skilled PT to address noted impairments and functional limitations during hospitalization (see below for any additional details).  Upon hospital discharge, anticipate no further PT needs.    Follow Up Recommendations No PT follow up    Equipment Recommendations  None recommended by PT    Recommendations for Other Services       Precautions / Restrictions Precautions Precautions: Fall Precaution Comments: NG tube; foley catheter Restrictions Weight Bearing Restrictions: No      Mobility  Bed Mobility Overal bed mobility: Needs Assistance Bed Mobility: Supine to Sit     Supine to sit: Min assist;HOB elevated     General bed mobility comments: pt requesting assist of therapist's hand to sit up initially  Transfers Overall transfer level: Needs assistance Equipment used: None Transfers: Sit to/from Stand Sit to Stand: Min guard         General transfer comment: fairly  strong stand from bed  Ambulation/Gait Ambulation/Gait assistance: Min guard Gait Distance (Feet): 550 Feet Assistive device: None       General Gait Details: pt initially with cautious gait (decreased BOS and decreased B LE step length and decreased cadence) but improve step length, BOS, and cadence noted with cueing  Stairs            Wheelchair Mobility    Modified Rankin (Stroke Patients Only)       Balance Overall balance assessment: Needs assistance Sitting-balance support: No upper extremity supported;Feet supported Sitting balance-Leahy Scale: Normal Sitting balance - Comments: steady sitting reaching outside BOS   Standing balance support: During functional activity Standing balance-Leahy Scale: Good Standing balance comment: initially mild unsteadiness noted with ambulation but improved balance noted with increased distance ambulating                             Pertinent Vitals/Pain Pain Assessment: No/denies pain    Home Living Family/patient expects to be discharged to:: Private residence Living Arrangements: Spouse/significant other Available Help at Discharge: Family Type of Home: House Home Access: Level entry     Home Layout: One level Home Equipment: Toilet riser      Prior Function Level of Independence: Independent         Comments: H/o falls     Hand Dominance        Extremity/Trunk Assessment   Upper Extremity Assessment Upper Extremity Assessment: Generalized weakness    Lower Extremity Assessment Lower Extremity Assessment: Generalized weakness  Cervical / Trunk Assessment Cervical / Trunk Assessment: Normal  Communication   Communication: No difficulties  Cognition Arousal/Alertness: Awake/alert Behavior During Therapy: WFL for tasks assessed/performed Overall Cognitive Status: Within Functional Limits for tasks assessed                                        General Comments    Nursing cleared pt for participation in physical therapy and clamped NG tube for session.  Pt agreeable to PT session.    Exercises  Ambulation; endurance   Assessment/Plan    PT Assessment Patient needs continued PT services  PT Problem List Decreased strength;Decreased balance;Decreased mobility;Decreased knowledge of use of DME       PT Treatment Interventions DME instruction;Gait training;Stair training;Functional mobility training;Therapeutic activities;Therapeutic exercise;Balance training;Patient/family education    PT Goals (Current goals can be found in the Care Plan section)  Acute Rehab PT Goals Patient Stated Goal: to go home PT Goal Formulation: With patient Time For Goal Achievement: 04/13/19 Potential to Achieve Goals: Good    Frequency Min 2X/week   Barriers to discharge        Co-evaluation               AM-PAC PT "6 Clicks" Mobility  Outcome Measure Help needed turning from your back to your side while in a flat bed without using bedrails?: None Help needed moving from lying on your back to sitting on the side of a flat bed without using bedrails?: None Help needed moving to and from a bed to a chair (including a wheelchair)?: A Little Help needed standing up from a chair using your arms (e.g., wheelchair or bedside chair)?: A Little Help needed to walk in hospital room?: A Little Help needed climbing 3-5 steps with a railing? : A Little 6 Click Score: 20    End of Session Equipment Utilized During Treatment: Gait belt Activity Tolerance: Patient tolerated treatment well Patient left: in chair;with call bell/phone within reach;with chair alarm set Nurse Communication: Mobility status;Precautions;Other (comment)(NG tube needing to be reconnected to wall suction) PT Visit Diagnosis: Unsteadiness on feet (R26.81);Difficulty in walking, not elsewhere classified (R26.2);Muscle weakness (generalized) (M62.81)    Time: [id number] PT Time Calculation  (min) (ACUTE ONLY): 26 min   Charges:   PT Evaluation $PT Eval Low Complexity: 1 Low PT Treatments $Therapeutic Exercise: 8-22 mins       Leitha Bleak, PT 03/30/19, 12:20 PM [phone]

## 2019-03-30 NOTE — Progress Notes (Signed)
[facility] Hospital Day(s): 5.   Interval History: Patient seen and examined, no acute events or new complaints overnight. Patient reports that his abdominal pain has improved. He continues to have marked abdominal distension. No nausea or emesis. Question of flatus. NGT output ~500 ccs. No other issues.   Review of Systems:  Constitutional: denies fever, chills  Respiratory: denies any shortness of breath  Cardiovascular: denies chest pain or palpitations  Gastrointestinal: + distension, denies abdominal pain, N/V, or diarrhea/and bowel function as per interval history Musculoskeletal: denies pain, decreased motor or sensation   Vital signs in last 24 hours: [min-max] current  Temp:  [97.6 F (36.4 C)-97.8 F (36.6 C)] 97.8 F (36.6 C) (11/09 0513) Pulse Rate:  [92-96] 94 (11/09 0513) Resp:  [18-20] 20 (11/09 0513) BP: (151-155)/(57-76) 152/57 (11/09 0513) SpO2:  [93 %-97 %] 93 % (11/09 0513)     Height: 5\' 7"  (170.2 cm) Weight: 63.5 kg BMI (Calculated): 21.92   Intake/Output last 2 shifts:  11/08 0701 - 11/09 0700 In: 1644.9 [I.V.:1524.9; NG/GT:120] Out: 1500 [Urine:950; Emesis/NG output:550]   Physical Exam:  Constitutional: alert, cooperative and no distress  HEENT: NGT in place Respiratory: breathing non-labored at rest  Cardiovascular: regular rate and sinus rhythm  Gastrointestinal:soft, non-tender, markedly distended, tympanic, no rebound/guarding Integumentary: warm, dry  Labs:  CBC Latest Ref Rng & Units 03/30/2019 03/29/2019 03/27/2019  WBC 4.0 - 10.5 K/uL 3.8(L) 6.0 13.6(H)  Hemoglobin 13.0 - 17.0 g/dL 11.3(L) 12.2(L) 11.3(L)  Hematocrit 39.0 - 52.0 % 34.8(L) 36.7(L) 35.7(L)  Platelets 150 - 400 K/uL 225 221 217   CMP Latest Ref Rng & Units 03/30/2019 03/29/2019 03/27/2019  Glucose 70 - 99 mg/dL 121(H) 111(H) 108(H)  BUN 8 - 23 mg/dL 27(H) 32(H) 42(H)  Creatinine 0.61 - 1.24 mg/dL 1.41(H) 1.45(H) 1.66(H)  Sodium 135 -  145 mmol/L 136 139 138  Potassium 3.5 - 5.1 mmol/L 4.1 4.2 3.7  Chloride 98 - 111 mmol/L 97(L) 98 97(L)  CO2 22 - 32 mmol/L 24 27 29   Calcium 8.9 - 10.3 mg/dL 8.1(L) 8.5(L) 8.4(L)  Total Protein 6.5 - 8.1 g/dL - - -  Total Bilirubin 0.3 - 1.2 mg/dL - - -  Alkaline Phos 38 - 126 U/L - - -  AST 15 - 41 U/L - - -  ALT 0 - 44 U/L - - -    Imaging studies:   KUB (03/30/2019) personally reviewed with continued dilation of small bowel loops, and radiologist report reviewed:  IMPRESSION: Stable small bowel dilatation is noted concerning for distal small bowel obstruction.   Assessment/Plan:  85 y.o. male without significant return of bowel function and still with marked distension of small bowel loops attributable to small bowel obstruction most likely attributable to post-surgical adhesions although there is question of possible volvulus vs internal hernia on imaging but his abdominal examination and vitals are reassuring   - As he has failed to improve x5 days, at this popint he will benefit from exploratory laparotomy.   - All risks, benefits, and alternatives to above procedure(s) were discussed with the patient, all of his questions were answered to his expressed satisfaction, patient expresses he wishes to proceed, and informed consent was obtained.    - Will consult for PICC and TPN   - Continue NGT decompression  - pain control prn (minimize narcotics); antiemetics prn - monitor abdominal examination; on-going bowel function   - medical management of comorbidities; hold home medications for now -  Mobilization encouraged   All of the above findings and recommendations were discussed with the patient, and the medical team, and all of patient's questions were answered to his expressed satisfaction.  -- Edison Simon, PA-C [facility] 03/30/2019, 7:20 AM [phone] M-F: 7am - 4pm

## 2019-03-30 NOTE — Progress Notes (Signed)
Peripherally Inserted Central Catheter/Midline Placement  The IV Nurse has discussed with the patient and/or persons authorized to consent for the patient, the purpose of this procedure and the potential benefits and risks involved with this procedure.  The benefits include less needle sticks, lab draws from the catheter, and the patient may be discharged home with the catheter. Risks include, but not limited to, infection, bleeding, blood clot (thrombus formation), and puncture of an artery; nerve damage and irregular heartbeat and possibility to perform a PICC exchange if needed/ordered by physician.  Alternatives to this procedure were also discussed.  Bard Power PICC patient education guide, fact sheet on infection prevention and patient information card has been provided to patient /or left at bedside.    PICC/Midline Placement Documentation  PICC Double Lumen 0000000 PICC Right Basilic 36 cm 0 cm (Active)  Indication for Insertion or Continuance of Line Administration of hyperosmolar/irritating solutions (i.e. TPN, Vancomycin, etc.) 03/30/19 0902  Exposed Catheter (cm) 0 cm 03/30/19 0902  Site Assessment Clean;Dry;Intact 03/30/19 0902  Lumen #1 Status Flushed;Blood return noted;Saline locked 03/30/19 0902  Lumen #2 Status Flushed;Blood return noted;Saline locked 03/30/19 0902  Dressing Type Transparent 03/30/19 0902  Dressing Status Dry;Clean;Intact;Antimicrobial disc in place 03/30/19 0902  Dressing Change Due 04/06/19 03/30/19 0902       [name] 03/30/2019, 9:03 AM

## 2019-03-30 NOTE — Anesthesia Post-op Follow-up Note (Signed)
Anesthesia QCDR form completed.        

## 2019-03-30 NOTE — Consult Note (Signed)
PHARMACY - ADULT TOTAL PARENTERAL NUTRITION CONSULT NOTE   Pharmacy Consult for TPN Indication: Bowel obstruction  Patient Measurements: Height: 5\' 7"  (170.2 cm) Weight: 140 lb (63.5 kg) IBW/kg (Calculated) : 66.1 TPN AdjBW (KG): 63.5 Body mass index is 21.93 kg/m.   Assessment:  Micheal [name] a 85 y.o.malewith Past medical history ofCOPD, CAD, type II DM, GERD, HTN, HLD, active smoker. Patient will likely have surgery today, d/t SBO and has been NPO for 5 days.    Anticoagulant: SQH Hgb 12.2 > 11.3 Plt 221 >>225 GI: PPI IV -LBM 03/21/19 Endo:  SSI Q4H A1c 8.3,SSI Insulin requirements in the past 24 hours: 2 u (Novolog) Lytes: WNL -LR IVMF at 100 ml/hr Renal: 1.45 > 1.41 Pulm: Dulera  Cards: metoprolol IV 5 mg Q6H Hepatobil: Neuro: ID:WBC 6 > 3.8  TPN Access:  PICC line placement 11/9  TPN start date: 03/30/2019 Nutritional Goals (per RD recommendation on 03/30/2019): Cal: 1863 Protein: 96 g Fluid: 1.6-1.8 L   Goal TPN rate is 83 ml/hr (Provides 1863 calories and 96 grams of protein)  Current Nutrition:   Plan:  -Clinimix E 5/15 TPN at 40 mL/hr. Reduced LR to 60 mL/hr for a total TPN + IVF rate at 116ml/hr.   -20% lipid emulsion 243ml daily @ 20 mL/hr x 12 hrs   -This TPN provides 192 kcal of protein and 489.6 kcal of dextrose which provides 681.6 kCals per day, meeting 90% of patient needs  -Electrolytes in TPN: No additional  - Ordered Magnesium IV 2g x1 dose  -Add MVI daily, trace elements daily, thiamin 100 mg daily, 0 units of regular insulin, and 0 units to TPN  -Q4H moderate/NPO SSI and adjust as needed  -Monitor TPN labs:    Albumin daily x 3 days, then at minimum Monday and Thursday   CBC day 1, then weekly   BMP, Mag, Phosphorus Daily x 3 days, then at minimum Monday and Thursday    Liver function On day 1, then weekly    TG On day 2, then weekly  -F/U: Electrolytes with AM labs and monitor BG   Micheal [name]  Micheal [name] 03/30/2019,7:55 AM

## 2019-03-30 NOTE — Progress Notes (Signed)
PROGRESS NOTE  Micheal [name]  DOB: September 11, 1932  PCP: Gweneth Fritter, MD [id number]  DOA: 03/25/2019  LOS: 5 days   Brief narrative: Micheal [name] is a 85 y.o. male with Past medical history of COPD, CAD, type II DM, GERD, HTN, HLD, active smoker. Patient presented to the ED on 11/5 from home with complaint of abdominal pain, nausea and vomiting, last bowel movement 10/31. CT abdomen showed high-grade small bowel obstruction with transition point in the central lower abdomen potentially due to an adhesion.   He was admitted under general surgery service.  Medical consultation was obtained for comanagement of medical issues. Currently patient remains under conservative management for bowel obstruction.  Subjective: Patient was seen and examined this morning.  Pleasant elderly Caucasian male.   Sitting up in chair.  No bowel movement.  Minimal gas output.  Distended belly.  Waiting for surgery today.    Assessment/Plan: Small bowel obstruction -Likely due to postsurgical adhesions. -Not improving with conservative management.  General surgery plans to take patient to OR for exploratory laparotomy today.    Leukocytosis -WBC count was elevated to 28,000 on admission.  No focus of infection was noted.  Gradually improving, white blood cell count back to normal.  AKI on CKD stage IIIb Acute hyponatremia -Creatinine 1.5-1.6 at baseline. -Presented with creatinine elevated to 2.14. Sodium level was low at 129. -Both creatinine level and sodium level improved down to normal range with IV hydration. Continue IV hydration  T2DM -hemoglobin A1c 8.3. -Home meds include glipizide and Metformin.  Currently on hold. -Continue sliding scale insulin with Accu-Cheks.  Cardiovascular issues: HTN/HLD/CAD -Home meds include carvedilol, lisinopril, aspirin, statin. -Currently all of them on hold. On metoprolol as needed IV.  We will add IV hydralazine for blood pressure over 160.  COPD  -Stable.  Continue inhalers.  GERD - PPI  Current daily smoker -Counseled to quit.  Nicotine patch offered.  BPH -continue Flomax.  Mobility: Encourage ambulation DVT prophylaxis:  Heparin subcu Code Status:   Code Status: Full Code  Family Communication:  Expected Discharge:  Getting exploratory laparotomy done today.  Consultants:  General surgery  Procedures:  None  Antimicrobials: Anti-infectives (From admission, onward)   None      Diet Order            Diet NPO time specified Except for: Sips with Meds  Diet effective now              Infusions:  . Marland KitchenTPN (CLINIMIX-E) Adult    . Fat emulsion    . lactated ringers 100 mL/hr at 03/30/19 0815    Scheduled Meds: . Chlorhexidine Gluconate Cloth  6 each Topical Daily  . heparin  5,000 Units Subcutaneous Q8H  . insulin aspart  0-15 Units Subcutaneous Q4H  . metoprolol tartrate  5 mg Intravenous Q6H  . mometasone-formoterol  2 puff Inhalation BID  . nicotine  14 mg Transdermal Daily  . pantoprazole (PROTONIX) IV  40 mg Intravenous QHS  . polyvinyl alcohol  1 drop Both Eyes BID  . tamsulosin  0.4 mg Oral Daily    PRN meds: albuterol, hydrALAZINE, ipratropium-albuterol, ketorolac, morphine injection, ondansetron **OR** ondansetron (ZOFRAN) IV, sodium chloride flush   Objective: Vitals:   03/30/19 0808 03/30/19 1338  BP: (!) 148/73 132/62  Pulse: 91 85  Resp: 16 15  Temp: (!) 97.5 F (36.4 C) 98.2 F (36.8 C)  SpO2: 96% 99%    Intake/Output Summary (Last 24 hours) at 03/30/2019  [place] filed at 03/30/2019 1151 Gross per 24 hour  Intake 1979.29 ml  Output 1000 ml  Net 979.29 ml   Filed Weights   03/25/19 1343  Weight: 63.5 kg   Weight change:  Body mass index is 21.93 kg/m.   Physical Exam: General exam: Appears calm, does not complain but looks very sick.  NG tube attached to suction. Skin: No rashes, lesions or ulcers. HEENT: Atraumatic, normocephalic, supple neck, no obvious  bleeding Lungs: Clear to auscultate bilaterally CVS: Regular rate and rhythm, no murmur GI/Abd soft, distended, tympanic, bowel sounds sluggish.   CNS: Alert, awake, oriented x3 Psychiatry: Mood appropriate Extremities: No pedal edema, no calf tenderness  Data Review: I have personally reviewed the laboratory data and studies available.  Recent Labs  Lab 03/25/19 1235 03/26/19 0553 03/27/19 0544 03/29/19 0435 03/30/19 0528  WBC 27.9* 19.0* 13.6* 6.0 3.8*  NEUTROABS  --  16.8* 11.7* 4.4 2.1  HGB 13.0 12.0* 11.3* 12.2* 11.3*  HCT 38.7* 36.7* 35.7* 36.7* 34.8*  MCV 89.8 92.0 93.9 91.1 92.8  PLT 295 226 217 221 225   Recent Labs  Lab 03/25/19 1235 03/26/19 0553 03/27/19 0544 03/29/19 0435 03/30/19 0528 03/30/19 1002  NA 129* 139 138 139 136  --   K 4.2 3.7 3.7 4.2 4.1  --   CL 88* 93* 97* 98 97*  --   CO2 24 33* 29 27 24   --   GLUCOSE 366* 98 108* 111* 121*  --   BUN 57* 51* 42* 32* 27*  --   CREATININE 2.14* 1.87* 1.66* 1.45* 1.41*  --   CALCIUM 9.2 9.2 8.4* 8.5* 8.1*  --   MG  --  2.2  --   --   --  1.7  PHOS  --   --   --   --   --  2.8    Terrilee Croak, MD  [facility] 03/30/2019

## 2019-03-30 NOTE — Transfer of Care (Signed)
Immediate Anesthesia Transfer of Care Note  Patient: Micheal [name]  Procedure(s) Performed: EXPLORATORY LAPAROTOMY, POSSIBLE BOWEL RESECTION, (N/A )  Patient Location: PACU  Anesthesia Type:General  Level of Consciousness: awake and alert   Airway & Oxygen Therapy: Patient Spontanous Breathing and Patient connected to face mask oxygen  Post-op Assessment: Report given to RN and Post -op Vital signs reviewed and stable  Post vital signs: Reviewed and stable  Last Vitals:  Vitals Value Taken Time  BP 133/54 03/30/19 1830  Temp    Pulse 99 03/30/19 1833  Resp 29 03/30/19 1833  SpO2 99 % 03/30/19 1833  Vitals shown include unvalidated device data.  Last Pain:  Vitals:   03/30/19 1543  TempSrc: Tympanic  PainSc: 0-No pain      Patients Stated Pain Goal: 0 ([id number])  Complications: No apparent anesthesia complications

## 2019-03-30 NOTE — Anesthesia Procedure Notes (Signed)
Procedure Name: Intubation Performed by: Staci Acosta, CRNA Pre-anesthesia Checklist: Patient identified, Patient being monitored, Timeout performed, Emergency Drugs available and Suction available Patient Re-evaluated:Patient Re-evaluated prior to induction Oxygen Delivery Method: Circle system utilized Preoxygenation: Pre-oxygenation with 100% oxygen Induction Type: IV induction Ventilation: Mask ventilation without difficulty Laryngoscope Size: Mac and 3 Grade View: Grade I Tube type: Oral Tube size: 7.0 mm Number of attempts: 1 Airway Equipment and Method: Stylet Placement Confirmation: ETT inserted through vocal cords under direct vision,  positive ETCO2 and breath sounds checked- equal and bilateral Secured at: 21 cm Tube secured with: Tape Dental Injury: Teeth and Oropharynx as per pre-operative assessment

## 2019-03-30 NOTE — Op Note (Signed)
  Procedure Date:  03/30/2019  Pre-operative Diagnosis:  Small bowel obstruction  Post-operative Diagnosis:  Small bowel obstruction  Procedure:  Exploratory Laparotomy and Lysis of adhesions  Surgeon:  Melvyn Neth, MD  Assistant:  Fredirick Maudlin, MD.  Her assistance was critical due to the complexity of the procedure, possibility of requiring bowel resection.  Also assisting, Merlinda Frederick, PA-S  Anesthesia:  General endotracheal  Estimated Blood Loss:  25 ml  Specimens:  None  Complications:  None  Indications for Procedure:  This is a 85 y.o. male who presents with small bowel obstruction, which has failed medical conservative management.  He is being brought to the OR for exploratory laparotomy.  The risks of bleeding, abscess or infection, injury to surrounding structures, and need for further procedures were all discussed with the patient and was willing to proceed.  Description of Procedure: The patient was correctly identified in the preoperative area and brought into the operating room.  The patient was placed supine with VTE prophylaxis in place.  Appropriate time-outs were performed.  Anesthesia was induced and the patient was intubated.  Foley catheter was already in  place.  Appropriate antibiotics were infused.  The abdomen was prepped and draped in a sterile fashion.  A midline incision was made and electrocautery was used to dissect down the subcutaneous tissue to the fascia.  The fascia was incised and extended superiorly and inferiorly.  The abdomen was explored revealing extensive adhesions from the small bowel to the low right abdominal wall.  Careful adhesiolysis was carried, freeing the small bowel loops from the abdominal wall, and dissecting some intraloop adhesions as well.  There were two small serosal tears that were repaired primarily but no bowel perforation.  The small bowel was eventually freed and was run from ileocecal junction proximally to the  Ligament of Treitz.  Small bowel contents were milked back to the stomach, and NG tube placement was confirmed in good position. The abdomen was thoroughly irrigated and hemostasis was good.  60 ml of Exparel solution mixed with 0.5% bupivacaine with epi was infiltrated onto the peritoneum, fascia, and subcutaneous tissue.  The fascia was closed using #1 PDS sutures.  The midline wound was irrigated and closed using staples.  The wound was dressed with Honeycomb dressing.   The patient was emerged from anesthesia and extubated and brought to the recovery room for further management.  The patient tolerated the procedure well and all counts were correct at the end of the case.   Melvyn Neth, MD

## 2019-03-30 NOTE — Anesthesia Preprocedure Evaluation (Signed)
Anesthesia Evaluation  Patient identified by MRN, date of birth, ID band Patient awake    Reviewed: Allergy & Precautions, H&P , NPO status , Patient's Chart, lab work & pertinent test results, reviewed documented beta blocker date and time   Airway Mallampati: II  TM Distance: >3 FB Neck ROM: full    Dental  (+) Teeth Intact   Pulmonary neg pulmonary ROS, shortness of breath and with exertion, pneumonia, COPD, former smoker,    Pulmonary exam normal        Cardiovascular Exercise Tolerance: Poor hypertension, On Medications + CAD and + Past MI  negative cardio ROS Normal cardiovascular exam Rhythm:regular Rate:Normal     Neuro/Psych PSYCHIATRIC DISORDERS negative neurological ROS  negative psych ROS   GI/Hepatic negative GI ROS, Neg liver ROS, GERD  Medicated,  Endo/Other  negative endocrine ROSdiabetes  Renal/GU Renal diseasenegative Renal ROS  negative genitourinary   Musculoskeletal   Abdominal   Peds  Hematology negative hematology ROS (+)   Anesthesia Other Findings Past Medical History: No date: Balance problem     Comment:  INTERMITTENT WITH NEGATIVE WORKUP No date: COPD (chronic obstructive pulmonary disease) (HCC) No date: Coronary artery disease No date: Diabetes mellitus without complication (HCC) No date: GERD (gastroesophageal reflux disease) No date: Hypercholesteremia No date: Hypertension No date: Myocardial infarction [facility])     Comment:  2005 No date: Pneumonia     Comment:  RECENT 5/17 No date: Shortness of breath dyspnea No date: Wheezing Past Surgical History: No date: APPENDECTOMY No date: CARDIAC CATHETERIZATION 10/04/2015: CATARACT EXTRACTION W/PHACO; Right     Comment:  Procedure: CATARACT EXTRACTION PHACO AND INTRAOCULAR               LENS PLACEMENT (IOC);  Surgeon: [name], MD;                Location: [facility] ORS;  Service: Ophthalmology;  Laterality:  Right;  [place] 02:06 AP% 17.9 CDE 22.65 fluid pack lot #               [id number] 12/13/2015: CATARACT EXTRACTION W/PHACO; Left     Comment:  Procedure: CATARACT EXTRACTION PHACO AND INTRAOCULAR               LENS PLACEMENT (IOC);  Surgeon: [name], MD;                Location: [facility] ORS;  Service: Ophthalmology;  Laterality:              Left;  [place] 1.39 AP% 25.3 CDE 26.37 Fluid pack lot #               [id number] No date: COLONOSCOPY No date: CORONARY ANGIOPLASTY     Comment:  Stent placement 01/10/2018: ORIF ANKLE FRACTURE; Left     Comment:  Procedure: OPEN REDUCTION INTERNAL FIXATION (ORIF) ANKLE              FRACTURE;  Surgeon: Dereck Leep, MD;  Location: [facility]               ORS;  Service: Orthopedics;  Laterality: Left; BMI    Body Mass Index: 21.93 kg/m     Reproductive/Obstetrics negative OB ROS                             Anesthesia Physical Anesthesia Plan  ASA: IV and emergent  Anesthesia Plan: General ETT   Post-op Pain Management:  Induction:   PONV Risk Score and Plan: 3  Airway Management Planned:   Additional Equipment:   Intra-op Plan:   Post-operative Plan:   Informed Consent: I have reviewed the patients History and Physical, chart, labs and discussed the procedure including the risks, benefits and alternatives for the proposed anesthesia with the patient or authorized representative who has indicated his/her understanding and acceptance.     Dental Advisory Given  Plan Discussed with: CRNA  Anesthesia Plan Comments:         Anesthesia Quick Evaluation

## 2019-03-31 ENCOUNTER — Encounter: Payer: Self-pay | Admitting: Surgery

## 2019-03-31 LAB — CBC WITH DIFFERENTIAL/PLATELET
Abs Immature Granulocytes: 0.14 10*3/uL — ABNORMAL HIGH (ref 0.00–0.07)
Basophils Absolute: 0 10*3/uL (ref 0.0–0.1)
Basophils Relative: 0 %
Eosinophils Absolute: 0 10*3/uL (ref 0.0–0.5)
Eosinophils Relative: 0 %
HCT: 33.6 % — ABNORMAL LOW (ref 39.0–52.0)
Hemoglobin: 10.7 g/dL — ABNORMAL LOW (ref 13.0–17.0)
Immature Granulocytes: 1 %
Lymphocytes Relative: 4 %
Lymphs Abs: 0.5 10*3/uL — ABNORMAL LOW (ref 0.7–4.0)
MCH: 29.9 pg (ref 26.0–34.0)
MCHC: 31.8 g/dL (ref 30.0–36.0)
MCV: 93.9 fL (ref 80.0–100.0)
Monocytes Absolute: 0.6 10*3/uL (ref 0.1–1.0)
Monocytes Relative: 6 %
Neutro Abs: 10.2 10*3/uL — ABNORMAL HIGH (ref 1.7–7.7)
Neutrophils Relative %: 89 %
Platelets: 217 10*3/uL (ref 150–400)
RBC: 3.58 MIL/uL — ABNORMAL LOW (ref 4.22–5.81)
RDW: 14 % (ref 11.5–15.5)
Smear Review: NORMAL
WBC: 11.5 10*3/uL — ABNORMAL HIGH (ref 4.0–10.5)
nRBC: 0 % (ref 0.0–0.2)

## 2019-03-31 LAB — TRIGLYCERIDES: Triglycerides: 132 mg/dL (ref <150)

## 2019-03-31 LAB — MAGNESIUM: Magnesium: 2.1 mg/dL (ref 1.7–2.4)

## 2019-03-31 LAB — GLUCOSE, CAPILLARY
Glucose-Capillary: 222 mg/dL — ABNORMAL HIGH (ref 70–99)
Glucose-Capillary: 196 mg/dL — ABNORMAL HIGH (ref 70–99)
Glucose-Capillary: 166 mg/dL — ABNORMAL HIGH (ref 70–99)
Glucose-Capillary: 123 mg/dL — ABNORMAL HIGH (ref 70–99)
Glucose-Capillary: 195 mg/dL — ABNORMAL HIGH (ref 70–99)

## 2019-03-31 LAB — BASIC METABOLIC PANEL
Anion gap: 8 (ref 5–15)
BUN: 34 mg/dL — ABNORMAL HIGH (ref 8–23)
CO2: 28 mmol/L (ref 22–32)
Calcium: 7.8 mg/dL — ABNORMAL LOW (ref 8.9–10.3)
Chloride: 99 mmol/L (ref 98–111)
Creatinine, Ser: 1.67 mg/dL — ABNORMAL HIGH (ref 0.61–1.24)
GFR calc Af Amer: 43 mL/min — ABNORMAL LOW (ref >60)
GFR calc non Af Amer: 37 mL/min — ABNORMAL LOW (ref >60)
Glucose, Bld: 244 mg/dL — ABNORMAL HIGH (ref 70–99)
Potassium: 4.1 mmol/L (ref 3.5–5.1)
Sodium: 135 mmol/L (ref 135–145)

## 2019-03-31 LAB — PHOSPHORUS: Phosphorus: 3.6 mg/dL (ref 2.5–4.6)

## 2019-03-31 LAB — ALBUMIN: Albumin: 2.1 g/dL — ABNORMAL LOW (ref 3.5–5.0)

## 2019-03-31 MED ORDER — TRACE MINERALS CU-MN-SE-ZN 300-55-60-3000 MCG/ML IV SOLN
INTRAVENOUS | Status: AC
  Administered 2019-03-31: 19:00:00 via INTRAVENOUS
  Filled 2019-03-31: qty 1920

## 2019-03-31 MED ORDER — FAT EMULSION PLANT BASED 20 % IV EMUL
250.0000 mL | INTRAVENOUS | Status: AC
  Administered 2019-03-31: 250 mL via INTRAVENOUS
  Filled 2019-03-31: qty 250

## 2019-03-31 NOTE — Consult Note (Signed)
PHARMACY - ADULT TOTAL PARENTERAL NUTRITION CONSULT NOTE   Pharmacy Consult for TPN Indication: Bowel obstruction  Patient Measurements: Height: 5\' 7"  (170.2 cm) Weight: 139 lb 15.9 oz (63.5 kg) IBW/kg (Calculated) : 66.1 TPN AdjBW (KG): 63.5 Body mass index is 21.93 kg/m.   Assessment:  Micheal [name] a 85 y.o.malewith Past medical history ofCOPD, CAD, type II DM, GERD, HTN, HLD, active smoker. Patient will likely have surgery today, d/t SBO and has been NPO for 5 days.    Anticoagulant: SQH Hgb 12.2 > 11.3 > 10.7 > 11.5 Plt 221 >>225 > 217 > 217 GI: PPI IV -LBM 10/31 Endo:  SSI Q4H A1c 8.3,SSI Insulin requirements in the past 24 hours: 15 u (Novolog) Lytes: WNL -LR IVMF at 60 mL/hr Renal: 1.45 > 1.41> 1.67 (up from yesterday) Pulm: Dulera  Cards: metoprolol IV 5 mg Q6H Hepatobil: Neuro: ID:WBC 6 > 3.8 > 11.5 Ertapenem x1 dose (11/9 for pre-op)  TPN Access:  PICC line placement 11/9  TPN start date: 03/30/2019 Nutritional Goals (per RD recommendation on 03/30/2019): Cal: 1863 Protein: 96 g Fluid: 1.6-1.8 L   Goal TPN rate is 83 ml/hr (Provides 1863 calories and 96 grams of protein)  Current Nutrition:   Plan:  -Clinimix E 5/15 TPN at 80 mL/hr. Reduced LR to 20 mL/hr for a total TPN + IVF rate at 170ml/hr.   -20% lipid emulsion 265ml daily @ 20 mL/hr x 12 hrs   -This TPN provides 384 kcal of protein and 979.2 kcal of dextrose which provides 1363.2 kCals per day, meeting 90% of patient needs  -Electrolytes in TPN: No additional  -Add MVI daily, trace elements daily, thiamin 100 mg daily, 6 units of regular insulin to TPN  -Q4H moderate/NPO SSI and adjust as needed  -Monitor TPN labs:    Albumin daily x 3 days, then at minimum Monday and Thursday   CBC day 1, then weekly   BMP, Mag, Phosphorus Daily x 3 days, then at minimum Monday and Thursday    Liver function On day 1, then weekly    TG On day 2, then weekly  -F/U: Electrolytes with AM  labs and monitor BG   Dennise Raabe R Servando Kyllonen 03/31/2019,7:26 AM

## 2019-03-31 NOTE — Progress Notes (Signed)
Nutrition Follow-up  DOCUMENTATION CODES:   Not applicable  INTERVENTION:  Increase Clinimix E 5/15 to Goal rate of 55mL/hr.   Continue 20% ILE 237mL/day x 12hrs.  Continue MVI and trace elements in TPN  Continue Daily wt.  Keep total IVF + TPN at 173mL/hr per MD  Continue 100mg  Thiamin daily in TPN    NUTRITION DIAGNOSIS:   Inadequate oral intake related to acute illness(SBO) as evidenced by NPO status.   GOAL:   Patient will meet greater than or equal to 90% of their needs   MONITOR:   Diet advancement, Labs, I & O's, Weight trends, Skin, Other (Comment)(TPN)  REASON FOR ASSESSMENT:   NPO/Clear Liquid Diet    ASSESSMENT:   85 yo male Adm for SBO. PMH significant of DM, COPD, CAD, GERD, MI(2005), HTN, Hypercholestemia.  Reviewed labs pertinent to refeeding syndrome, Phosphorus 3.6, normal; magnesium 2.1, normal; sodium 135, normal; potassium 4.1, normal. Glucose high at 196, will likely be higher once TPN at goal rate. Triglycerides normal at 132. Pt s/p ex lap and LOA in small bowel on 11/9, no portion of small or large bowel removed. NG tube in place, 25 mL output. No new weight recorded.   Diet Order:   Diet Order            Diet NPO time specified Except for: Sips with Meds  Diet effective now              EDUCATION NEEDS:   Education needs have been addressed  Skin:  Skin Assessment: Skin Integrity Issues: Skin Integrity Issues:: Incisions Incisions: abdomen  Last BM:  10/31  Height:   Ht Readings from Last 1 Encounters:  03/30/19 5\' 7"  (1.702 m)    Weight:   Wt Readings from Last 1 Encounters:  03/30/19 63.5 kg    Ideal Body Weight:  67.3 kg  BMI:  Body mass index is 21.93 kg/m.  Estimated Nutritional Needs:   Kcal:  1700-1900  Protein:  85-95 grams  Fluid:  1.6-1.8 L  Meda Klinefelter, Dietetic Intern

## 2019-03-31 NOTE — Progress Notes (Signed)
PROGRESS NOTE  DARIKSON NAGY  DOB: 1932/06/13  PCP: Gweneth Fritter, MD [id number]  DOA: 03/25/2019  LOS: 6 days   Brief narrative: Micheal [name] is a 85 y.o. male with Past medical history of COPD, CAD, type II DM, GERD, HTN, HLD, active smoker. Patient presented to the ED on 11/5 from home with complaint of abdominal pain, nausea and vomiting, last bowel movement 10/31. CT abdomen showed high-grade small bowel obstruction with transition point in the central lower abdomen potentially due to an adhesion.   He was admitted under general surgery service.  Medical consultation was obtained for comanagement of medical issues. Patient failed conservative management for bowel obstruction. 11/9, underwent exploratory laparotomy with adhesiolysis.  Subjective: Patient was seen and examined this morning.  Looks tired.  Able to open eyes on verbal command.  Nods head to complete no pain.  NG tube suction continues.  On TPN.   Assessment/Plan: Small bowel obstruction -Patient failed conservative management for bowel obstruction. -11/9, underwent exploratory laparotomy with adhesiolysis. -Currently on postop ileus.  On TPN.  Leukocytosis -WBC count was elevated to 28,000 on admission.  No focus of infection was noted.  Gradually improving, white blood cell count back to normal.  AKI on CKD stage IIIb Acute hyponatremia -Creatinine 1.5-1.6 at baseline. -Presented with creatinine elevated to 2.14. Sodium level was low at 129. -Both creatinine level and sodium level currently at baseline.  Continue hydration. T2DM -hemoglobin A1c 8.3. -Home meds include glipizide and Metformin.  Currently on hold. -Continue sliding scale insulin with Accu-Cheks.  Cardiovascular issues: HTN/HLD/CAD -Home meds include carvedilol, lisinopril, aspirin, statin. -Currently all of them on hold. On metoprolol IV PRN and hydralazine IV PRN.   -Resume Coreg when oral intake is insured.  COPD -Stable.  Continue inhalers.  GERD - PPI  Current daily smoker -Counseled to quit.  Nicotine patch offered.  BPH -continue Flomax.  Mobility: Encourage ambulation DVT prophylaxis:  Heparin subcu Code Status:   Code Status: Full Code  Family Communication:  Expected Discharge:  Depends on clinical course.  Currently has postop ileus.  Consultants:  General surgery  Procedures:  None  Antimicrobials: Anti-infectives (From admission, onward)   Start     Dose/Rate Route Frequency Ordered Stop   03/30/19 1600  ertapenem (INVANZ) 1,000 mg in sodium chloride 0.9 % 100 mL IVPB     1 g 200 mL/hr over 30 Minutes Intravenous On call to O.R. 03/30/19 1558 03/30/19 1642      Diet Order            Diet NPO time specified Except for: Sips with Meds  Diet effective now              Infusions:  . Marland KitchenTPN (CLINIMIX-E) Adult 40 mL/hr at 03/31/19 0300  . Marland KitchenTPN (CLINIMIX-E) Adult    . Fat emulsion    . lactated ringers 20 mL/hr at 03/31/19 1300    Scheduled Meds: . Chlorhexidine Gluconate Cloth  6 each Topical Daily  . heparin  5,000 Units Subcutaneous Q8H  . insulin aspart  0-15 Units Subcutaneous Q4H  . metoprolol tartrate  5 mg Intravenous Q6H  . mometasone-formoterol  2 puff Inhalation BID  . nicotine  14 mg Transdermal Daily  . pantoprazole (PROTONIX) IV  40 mg Intravenous QHS  . polyvinyl alcohol  1 drop Both Eyes BID  . tamsulosin  0.4 mg Oral Daily    PRN meds: albuterol, hydrALAZINE, ipratropium-albuterol, ketorolac, morphine injection, ondansetron **OR** ondansetron (ZOFRAN) IV,  sodium chloride flush   Objective: Vitals:   03/31/19 0521 03/31/19 1258  BP: (!) 105/48 (!) 103/46  Pulse: 82 86  Resp: 20 20  Temp: 97.6 F (36.4 C) 97.9 F (36.6 C)  SpO2: 100% 97%    Intake/Output Summary (Last 24 hours) at 03/31/2019 1419 Last data filed at 03/31/2019 1300 Gross per 24 hour  Intake 1597.36 ml  Output 755 ml  Net 842.36 ml   Filed Weights   03/25/19 1343 03/30/19  1543  Weight: 63.5 kg 63.5 kg   Weight change:  Body mass index is 21.93 kg/m.   Physical Exam: General exam: Looks sick.  Does not complain of pain.  Able to open eyes on verbal command Skin: No rashes, lesions or ulcers. HEENT: NG tube in place. Lungs: Clear to auscultation bilaterally CVS: Regular rate and rhythm, no murmur GI/Abd soft, tympanic bowel sounds, appropriate tenderness post surgery.   CNS: Opens eyes on verbal command.  Nods head to answer simple questions. Psychiatry: Mood appropriate Extremities: No pedal edema, no calf tenderness  Data Review: I have personally reviewed the laboratory data and studies available.  Recent Labs  Lab 03/26/19 0553 03/27/19 0544 03/29/19 0435 03/30/19 0528 03/31/19 0605  WBC 19.0* 13.6* 6.0 3.8* 11.5*  NEUTROABS 16.8* 11.7* 4.4 2.1 10.2*  HGB 12.0* 11.3* 12.2* 11.3* 10.7*  HCT 36.7* 35.7* 36.7* 34.8* 33.6*  MCV 92.0 93.9 91.1 92.8 93.9  PLT 226 217 221 225 217   Recent Labs  Lab 03/26/19 0553 03/27/19 0544 03/29/19 0435 03/30/19 0528 03/30/19 1002 03/31/19 0605  NA 139 138 139 136  --  135  K 3.7 3.7 4.2 4.1  --  4.1  CL 93* 97* 98 97*  --  99  CO2 33* 29 27 24   --  28  GLUCOSE 98 108* 111* 121*  --  244*  BUN 51* 42* 32* 27*  --  34*  CREATININE 1.87* 1.66* 1.45* 1.41*  --  1.67*  CALCIUM 9.2 8.4* 8.5* 8.1*  --  7.8*  MG 2.2  --   --   --  1.7 2.1  PHOS  --   --   --   --  2.8 3.6    Terrilee Croak, MD  [facility] 03/31/2019

## 2019-03-31 NOTE — Progress Notes (Signed)
[place] [facility] Day(s): 6.   Post op day(s): 1 Day Post-Op.   Interval History:  Patient seen and examined no acute events or new complaints overnight.  Patient reports incisional soreness, some improvement in his distension, no nausea or emesis. No flatus NPO + TPN Slight bump in renal function, U/O - 655 NGT on 25 ccs out   Vital signs in last 24 hours: [min-max] current  Temp:  [97.1 F (36.2 C)-98.2 F (36.8 C)] 97.6 F (36.4 C) (11/10 0521) Pulse Rate:  [82-107] 82 (11/10 0521) Resp:  [13-31] 20 (11/10 0521) BP: (105-148)/(40-73) 105/48 (11/10 0521) SpO2:  [96 %-100 %] 100 % (11/10 0521) Weight:  [63.5 kg] 63.5 kg (11/09 1543)     Height: 5\' 7"  (170.2 cm) Weight: 63.5 kg BMI (Calculated): 21.92   Intake/Output last 2 shifts:  11/09 0701 - 11/10 0700 In: 2496.5 [I.V.:2456.5; NG/GT:40] Out: 20 [Urine:655; Emesis/NG output:25; Blood:25]   Physical Exam:  Constitutional: alert, cooperative and no distress  HEENT: NGT in place; flushes easily Respiratory: breathing non-labored at rest  Cardiovascular: regular rate and sinus rhythm  Gastrointestinal: soft, incisional soreness, improvement in abdominal distension, no rebound/guarding.  Integumentary: Laparotomy incision is CDI with staples and honeycomb, no erythema or drainage   Labs:  CBC Latest Ref Rng & Units 03/31/2019 03/30/2019 03/29/2019  WBC 4.0 - 10.5 K/uL PENDING 3.8(L) 6.0  Hemoglobin 13.0 - 17.0 g/dL 10.7(L) 11.3(L) 12.2(L)  Hematocrit 39.0 - 52.0 % 33.6(L) 34.8(L) 36.7(L)  Platelets 150 - 400 K/uL 217 225 221   CMP Latest Ref Rng & Units 03/31/2019 03/30/2019 03/29/2019  Glucose 70 - 99 mg/dL 244(H) 121(H) 111(H)  BUN 8 - 23 mg/dL 34(H) 27(H) 32(H)  Creatinine 0.61 - 1.24 mg/dL 1.67(H) 1.41(H) 1.45(H)  Sodium 135 - 145 mmol/L 135 136 139  Potassium 3.5 - 5.1 mmol/L 4.1 4.1 4.2  Chloride 98 - 111 mmol/L 99 97(L) 98  CO2 22 - 32 mmol/L 28 24 27   Calcium 8.9 -  10.3 mg/dL 7.8(L) 8.1(L) 8.5(L)  Total Protein 6.5 - 8.1 g/dL - 5.6(L) -  Total Bilirubin 0.3 - 1.2 mg/dL - 1.6(H) -  Alkaline Phos 38 - 126 U/L - 62 -  AST 15 - 41 U/L - 12(L) -  ALT 0 - 44 U/L - 6 -     Imaging studies: No new pertinent imaging studies   Assessment/Plan:  85 y.o. male awaiting bowel function return (suspect NGT not working well) 1 Day Post-Op s/p exploratory laparotomy and lysis of adhesions for small bowel obstruction.   - Continue NGT decompression; flush q4 and monitor closely.   - NPO + TPN + IVF  - Discontinue Foley catheter  - pain control prn (minimize narcotics); antiemetics prn - monitor abdominal examination; on-going bowel function              - medical management of comorbidities; hold home medications for now - Mobilization encouraged; PT following  All of the above findings and recommendations were discussed with the patient, and the medical team, and all of patient's questions were answered to his expressed satisfaction.  -- Edison Simon, PA-C [facility] 03/31/2019, 7:59 AM [phone] M-F: 7am - 4pm

## 2019-04-01 ENCOUNTER — Inpatient Hospital Stay: Payer: Medicare Other

## 2019-04-01 LAB — GLUCOSE, CAPILLARY
Glucose-Capillary: 278 mg/dL — ABNORMAL HIGH (ref 70–99)
Glucose-Capillary: 157 mg/dL — ABNORMAL HIGH (ref 70–99)
Glucose-Capillary: 143 mg/dL — ABNORMAL HIGH (ref 70–99)
Glucose-Capillary: 122 mg/dL — ABNORMAL HIGH (ref 70–99)
Glucose-Capillary: 259 mg/dL — ABNORMAL HIGH (ref 70–99)
Glucose-Capillary: 255 mg/dL — ABNORMAL HIGH (ref 70–99)
Glucose-Capillary: 159 mg/dL — ABNORMAL HIGH (ref 70–99)

## 2019-04-01 LAB — BASIC METABOLIC PANEL
Anion gap: 5 (ref 5–15)
BUN: 39 mg/dL — ABNORMAL HIGH (ref 8–23)
CO2: 31 mmol/L (ref 22–32)
Calcium: 8 mg/dL — ABNORMAL LOW (ref 8.9–10.3)
Chloride: 100 mmol/L (ref 98–111)
Creatinine, Ser: 1.58 mg/dL — ABNORMAL HIGH (ref 0.61–1.24)
GFR calc Af Amer: 46 mL/min — ABNORMAL LOW (ref >60)
GFR calc non Af Amer: 40 mL/min — ABNORMAL LOW (ref >60)
Glucose, Bld: 146 mg/dL — ABNORMAL HIGH (ref 70–99)
Potassium: 4.2 mmol/L (ref 3.5–5.1)
Sodium: 136 mmol/L (ref 135–145)

## 2019-04-01 LAB — PHOSPHORUS: Phosphorus: 3 mg/dL (ref 2.5–4.6)

## 2019-04-01 LAB — MAGNESIUM: Magnesium: 2 mg/dL (ref 1.7–2.4)

## 2019-04-01 LAB — ALBUMIN: Albumin: 2.1 g/dL — ABNORMAL LOW (ref 3.5–5.0)

## 2019-04-01 MED ORDER — TRACE MINERALS CU-MN-SE-ZN 300-55-60-3000 MCG/ML IV SOLN
INTRAVENOUS | Status: AC
  Administered 2019-04-01: 18:00:00 via INTRAVENOUS
  Filled 2019-04-01: qty 1920

## 2019-04-01 MED ORDER — FAT EMULSION PLANT BASED 20 % IV EMUL
250.0000 mL | INTRAVENOUS | Status: AC
  Administered 2019-04-01: 250 mL via INTRAVENOUS
  Filled 2019-04-01: qty 250

## 2019-04-01 NOTE — Progress Notes (Addendum)
PROGRESS NOTE  Micheal Micheal [name] [id number] DOB: 09/21/1932 DOA: 03/25/2019 PCP: Gweneth Fritter, MD  HPI/Recap of past 24 hours: Micheal Micheal [name] a 85 y.o.malewith Past medical history ofCOPD, CAD, type II DM, GERD, HTN, HLD, active Micheal [name]. Patient presented to the ED on 11/5 from home with complaint of abdominal pain, nausea and vomiting, last bowel movement 10/31. CT abdomen showed high-grade small bowel obstruction with transition point in the central lower abdomen potentially due to an adhesion.   He was admitted under general surgery service.  Medical consultation was obtained for comanagement of medical issues. Patient failed conservative management for bowel obstruction. 11/9, underwent exploratory laparotomy with adhesiolysis.  04/01/19: Patient was seen and examined at his bedside this morning.  Reports severe abdominal pain 10 out of 10.  NG tube in place.  Hypoactive bowel sounds.  Reports positive flatus this morning.  Abdominal x-ray showed evidence of persistent ileus.  General surgery following.  Labs reviewed and electrolytes are optimized  Assessment/Plan: Principal Problem:   SBO (small bowel obstruction) (HCC) Active Problems:   Type 2 diabetes mellitus, uncontrolled, with renal complications (HCC)   COPD (chronic obstructive pulmonary disease) (HCC)   Gastroesophageal reflux disease without esophagitis   HTN (hypertension)   Micheal [name]   Acute renal failure superimposed on stage 3b chronic kidney disease (HCC)   Hyponatremia   Leucocytosis  Small bowel obstruction -Patient failed conservative management for bowel obstruction. -11/9, underwent exploratory laparotomy with adhesiolysis. -N.p.o. due to evidence of persistent ileus -On TPN, continue. Continue serial abdominal x-ray Mobilize Goal potassium greater than 4.0. Start daily BMPs with close monitoring of electrolytes Continue TPN and lactated Ringer's IV fluid hydration  Leukocytosis -WBC count  was elevated to 28,000 on admission.  No focus of infection was noted.  Gradually improving, white blood cell count back to normal.  AKI on CKD stage IIIb Baseline appears to be 1.4 with GFR of 45 Presented with creatinine 2.1 with GFR of 27 Renal function improving with creatinine trending down 1.58 with GFR 40 on 04/01/2019 Continue TPN and lactated ringer Monitor urine output We will follow nephrotoxins like NSAIDs Daily BMPs  Small bilateral pleural effusions Continue to monitor volume status Currently O2 saturation 96% on 1 L  Right eighth rib fracture, incidentally found Continue close monitoring and address as indicated  Resolved hypovolemic hyponatremia Serum sodium 136 on 04/01/2019  T2DM with hyperglycemia-hemoglobin A1c 8.3. -Continue to hold Home meds include glipizide and Metformin.  -Continue sliding scale insulin with Accu-Cheks.  Cardiovascular issues: HTN/HLD/CAD -Home meds include carvedilol, lisinopril, aspirin, statin. -Currently all of them on hold.  Continue IV Lopressor as needed with parameters Avoid hydralazine in the setting of tachycardia  COPD -Stable.  Continue bronchodilators  GERD -start IV PPI  Current daily Micheal [name] -tobacco cessation counseling at bedside.  BPH -Flomax on hold due to n.p.o.  Monitor urine output.  Mobility: Encourage ambulation DVT prophylaxis: Heparin subcu 3 times daily Code Status:  Code Status: Full Code  Family Communication:None at bedside Expected Discharge:  Plan to discharge once general surgery signs off and patient is hemodynamically stable  .Consultants:  General surgery  Procedures:  NG tube placement.      Objective: Vitals:   03/31/19 2110 04/01/19 0217 04/01/19 0616 04/01/19 1143  BP: (!) 106/51 (!) 144/64 (!) 150/58 (!) 137/59  Pulse: (!) 101 (!) 106 (!) 102 (!) 102  Resp: 20 20  18   Temp: 98.1 F (36.7 C)   98.5 F (36.9 C)  TempSrc: Oral   Oral  SpO2: 100% 98% 95% 96%   Weight:      Height:        Intake/Output Summary (Last 24 hours) at 04/01/2019 1400 Last data filed at 04/01/2019 0900 Gross per 24 hour  Intake 2268.27 ml  Output 250 ml  Net 2018.27 ml   Filed Weights   03/25/19 1343 03/30/19 1543  Weight: 63.5 kg 63.5 kg    Exam:  . General: 85 y.o. year-old male well developed well nourished in no acute distress.  Alert and oriented x4. . Cardiovascular: Tachycardia with no rubs or gallops.  No thyromegaly or JVD noted.   Marland Kitchen Respiratory: Clear to auscultation with no wheezes or rales. Good inspiratory effort. . Abdomen: Distended with hypoactive bowel sounds.  Surgical suture mid abdomen. . Musculoskeletal: Trace lower extremity edema. 2/4 pulses in all 4 extremities. Marland Kitchen Psychiatry: Mood is appropriate for condition and setting   Data Reviewed: CBC: Recent Labs  Lab 03/26/19 0553 03/27/19 0544 03/29/19 0435 03/30/19 0528 03/31/19 0605  WBC 19.0* 13.6* 6.0 3.8* 11.5*  NEUTROABS 16.8* 11.7* 4.4 2.1 10.2*  HGB 12.0* 11.3* 12.2* 11.3* 10.7*  HCT 36.7* 35.7* 36.7* 34.8* 33.6*  MCV 92.0 93.9 91.1 92.8 93.9  PLT 226 217 221 225 [id number]   Basic Metabolic Panel: Recent Labs  Lab 03/26/19 0553 03/27/19 0544 03/29/19 0435 03/30/19 0528 03/30/19 1002 03/31/19 0605 04/01/19 0507  NA 139 138 139 136  --  135 136  K 3.7 3.7 4.2 4.1  --  4.1 4.2  CL 93* 97* 98 97*  --  99 100  CO2 33* 29 27 24   --  28 31  GLUCOSE 98 108* 111* 121*  --  244* 146*  BUN 51* 42* 32* 27*  --  34* 39*  CREATININE 1.87* 1.66* 1.45* 1.41*  --  1.67* 1.58*  CALCIUM 9.2 8.4* 8.5* 8.1*  --  7.8* 8.0*  MG 2.2  --   --   --  1.7 2.1 2.0  PHOS  --   --   --   --  2.8 3.6 3.0   GFR: Estimated Creatinine Clearance: 31.3 mL/min (A) (by C-G formula based on SCr of 1.58 mg/dL (H)). Liver Function Tests: Recent Labs  Lab 03/30/19 1002 03/31/19 0605 04/01/19 0507  AST 12*  --   --   ALT 6  --   --   ALKPHOS 62  --   --   BILITOT 1.6*  --   --   PROT 5.6*  --    --   ALBUMIN 2.8* 2.1* 2.1*   No results for input(s): LIPASE, AMYLASE in the last 168 hours. No results for input(s): AMMONIA in the last 168 hours. Coagulation Profile: No results for input(s): INR, PROTIME in the last 168 hours. Cardiac Enzymes: No results for input(s): CKTOTAL, CKMB, CKMBINDEX, TROPONINI in the last 168 hours. BNP (last 3 results) No results for input(s): PROBNP in the last 8760 hours. HbA1C: No results for input(s): HGBA1C in the last 72 hours. CBG: Recent Labs  Lab 03/31/19 1952 04/01/19 0209 04/01/19 0550 04/01/19 0809 04/01/19 1144  GLUCAP 195* 278* 157* 143* 122*   Lipid Profile: Recent Labs    03/31/19 0610  TRIG 132   Thyroid Function Tests: No results for input(s): TSH, T4TOTAL, FREET4, T3FREE, THYROIDAB in the last 72 hours. Anemia Panel: No results for input(s): VITAMINB12, FOLATE, FERRITIN, TIBC, IRON, RETICCTPCT in the last 72 hours. Urine analysis:    Component  Value Date/Time   COLORURINE YELLOW (A) 03/27/2019 1222   APPEARANCEUR CLEAR (A) 03/27/2019 1222   LABSPEC 1.016 03/27/2019 1222   PHURINE 5.0 03/27/2019 1222   GLUCOSEU NEGATIVE 03/27/2019 1222   HGBUR SMALL (A) 03/27/2019 1222   BILIRUBINUR NEGATIVE 03/27/2019 1222   KETONESUR 20 (A) 03/27/2019 1222   PROTEINUR NEGATIVE 03/27/2019 1222   NITRITE NEGATIVE 03/27/2019 1222   LEUKOCYTESUR SMALL (A) 03/27/2019 1222   Sepsis Labs: @LABRCNTIP (procalcitonin:4,lacticidven:4)  ) Recent Results (from the past 240 hour(s))  SARS Coronavirus 2 by RT PCR (hospital order, performed in [place] hospital lab) Nasopharyngeal Nasopharyngeal Swab     Status: None   Collection Time: 03/25/19  5:16 PM   Specimen: Nasopharyngeal Swab  Result Value Ref Range Status   SARS Coronavirus 2 NEGATIVE NEGATIVE Final    Comment: (NOTE) If result is NEGATIVE SARS-CoV-2 target nucleic acids are NOT DETECTED. The SARS-CoV-2 RNA is generally detectable in upper and lower  respiratory specimens  during the acute phase of infection. The lowest  concentration of SARS-CoV-2 viral copies this assay can detect is 250  copies / mL. A negative result does not preclude SARS-CoV-2 infection  and should not be used as the sole basis for treatment or other  patient management decisions.  A negative result may occur with  improper specimen collection / handling, submission of specimen other  than nasopharyngeal swab, presence of viral mutation(s) within the  areas targeted by this assay, and inadequate number of viral copies  (<250 copies / mL). A negative result must be combined with clinical  observations, patient history, and epidemiological information. If result is POSITIVE SARS-CoV-2 target nucleic acids are DETECTED. The SARS-CoV-2 RNA is generally detectable in upper and lower  respiratory specimens dur ing the acute phase of infection.  Positive  results are indicative of active infection with SARS-CoV-2.  Clinical  correlation with patient history and other diagnostic information is  necessary to determine patient infection status.  Positive results do  not rule out bacterial infection or co-infection with other viruses. If result is PRESUMPTIVE POSTIVE SARS-CoV-2 nucleic acids MAY BE PRESENT.   A presumptive positive result was obtained on the submitted specimen  and confirmed on repeat testing.  While 2019 novel coronavirus  (SARS-CoV-2) nucleic acids may be present in the submitted sample  additional confirmatory testing may be necessary for epidemiological  and / or clinical management purposes  to differentiate between  SARS-CoV-2 and other Sarbecovirus currently known to infect humans.  If clinically indicated additional testing with an alternate test  methodology [phone]) is advised. The SARS-CoV-2 RNA is generally  detectable in upper and lower respiratory sp ecimens during the acute  phase of infection. The expected result is Negative. Fact Sheet for Patients:   StrictlyIdeas.no Fact Sheet for Healthcare Providers: BankingDealers.co.za This test is not yet approved or cleared by the [place] FDA and has been authorized for detection and/or diagnosis of SARS-CoV-2 by FDA under an Emergency Use Authorization (EUA).  This EUA will remain in effect (meaning this test can be used) for the duration of the COVID-19 declaration under Section 564(b)(1) of the Act, 21 U.S.C. section 360bbb-3(b)(1), unless the authorization is terminated or revoked sooner. Performed at [facility], [address], [place], [place] [address]       Studies: Dg Chest 2 View  Result Date: 04/01/2019 CLINICAL DATA:  Cough and abdominal soreness. Patient diagnosed with a right rib fracture on plain film of the chest earlier today. EXAM:  CHEST - 2 VIEW COMPARISON:  Plain film of the chest earlier today. FINDINGS: NG tube has been removed. Right PICC remains in place. The patient has small bilateral pleural effusions and basilar atelectasis, greater on the right. No pneumothorax. Right eighth rib fracture again seen. IMPRESSION: Negative for pneumothorax. Small bilateral pleural effusions and basilar atelectasis, greater on the right. Right eighth rib fracture. Electronically Signed   By: Inge Rise M.D.   On: 04/01/2019 10:47   Dg Chest Port 1 View  Addendum Date: 04/01/2019   ADDENDUM REPORT: 04/01/2019 09:24 ADDENDUM: These results were called by telephone at the time of interpretation on 04/01/2019 at 9:24 am to provider [facility] , who verbally acknowledged these results. Electronically Signed   By: Zetta Bills M.D.   On: 04/01/2019 09:24   Result Date: 04/01/2019 CLINICAL DATA:  Concern for pneumonia. EXAM: PORTABLE CHEST 1 VIEW COMPARISON:  It has FINDINGS: Mild to moderately displaced rib fracture on the right involving the right eighth rib laterally. Right-sided PICC line in situ terminates at the  caval to atrial junction. Enteric tube with side port just below GE junction projecting off the field of the radiograph. Cardiomediastinal contours are normal. There is graded opacity at the right lung base with some lucency at the right cardiophrenic angle. No apical pneumothorax on this semi-erect view. IMPRESSION: 1. Mild to moderately displaced rib fracture on the right involving the right eighth rib laterally. 2. There is graded opacity at the right lung base medially. Question lower lobe airspace disease and effusion. 3. Rib fracture with some subtle increased lucency at the right lung base. Raising the question of subtle sign of pneumothorax. Consider expiratory upright views of the chest for further assessment to exclude small pneumothorax. 4. A call is out to the referring provider to further discuss above findings. Electronically Signed: By: Zetta Bills M.D. On: 04/01/2019 09:07   Dg Abd Portable 1v  Result Date: 04/01/2019 CLINICAL DATA:  Small bowel obstruction. EXAM: PORTABLE ABDOMEN - 1 VIEW COMPARISON:  03/30/2019 and CT abdomen pelvis 03/25/2019. FINDINGS: Diffuse gaseous distention of small bowel and colon with gas and stool in the rectum. Nasogastric tube terminates in the stomach with the side port at or just above the gastroesophageal junction. Skin staples are seen in the midline of the lower abdomen and pelvis. Visualized lung bases are grossly clear. IMPRESSION: 1. Bowel gas pattern is indicative of an ileus. 2. Nasogastric tube could be advanced 8-10 cm for better positioning within the stomach, as clinically indicated. Electronically Signed   By: Lorin Picket M.D.   On: 04/01/2019 08:41    Scheduled Meds: . Chlorhexidine Gluconate Cloth  6 each Topical Daily  . heparin  5,000 Units Subcutaneous Q8H  . insulin aspart  0-15 Units Subcutaneous Q4H  . metoprolol tartrate  5 mg Intravenous Q6H  . mometasone-formoterol  2 puff Inhalation BID  . nicotine  14 mg Transdermal Daily   . pantoprazole (PROTONIX) IV  40 mg Intravenous QHS  . polyvinyl alcohol  1 drop Both Eyes BID  . tamsulosin  0.4 mg Oral Daily    Continuous Infusions: . Marland KitchenTPN (CLINIMIX-E) Adult 80 mL/hr at 04/01/19 0900  . Marland KitchenTPN (CLINIMIX-E) Adult    . Fat emulsion    . lactated ringers 20 mL/hr at 04/01/19 0900     LOS: 7 days     Kayleen Memos, MD [facility] Pager [phone]  If 7PM-7AM, please contact night-coverage [email or web address] Password [email or web address] 04/01/2019, 2:00 PM

## 2019-04-01 NOTE — Plan of Care (Signed)
  Problem: Activity: Goal: Risk for activity intolerance will decrease 04/01/2019 1804 by Deklan Minar A, RN Outcome: Progressing 04/01/2019 1624 by [name], RN Outcome: Progressing  Patient walking with PT

## 2019-04-01 NOTE — Progress Notes (Signed)
[place] Hospital Day(s): 7.   Post op day(s): 2 Days Post-Op.   Interval History:  Patient seen and examined No acute events or new complaints overnight.  Patient reports he is still having abdominal soreness, exacerbated with coughing, and abdominal distension Renal function improved NGT output only recorded as 50 ccs - tube does not appear to be working On TPN   Vital signs in last 24 hours: [min-max] current  Temp:  [97.5 F (36.4 C)-98.1 F (36.7 C)] 98.1 F (36.7 C) (11/10 2110) Pulse Rate:  [86-106] 102 (11/11 0616) Resp:  [20] 20 (11/11 0217) BP: (103-150)/(46-64) 150/58 (11/11 0616) SpO2:  [95 %-100 %] 95 % (11/11 0616)     Height: 5\' 7"  (170.2 cm) Weight: 63.5 kg BMI (Calculated): 21.92   Intake/Output last 2 shifts:  11/10 0701 - 11/11 0700 In: 347.4 [I.V.:227.4; NG/GT:120] Out: 300 [Urine:250; Emesis/NG output:50]   Physical Exam:  Constitutional: alert, cooperative and no distress  HEENT: NGT in place - does not appear to be working Respiratory: breathing non-labored at rest, rhonchorous breath sounds  Cardiovascular: regular rate and sinus rhythm  Gastrointestinal: soft, incisional soreness, improvement in abdominal distension, no rebound/guarding.  Integumentary: Laparotomy incision is CDI with staples and honeycomb, no erythema or drainage    Labs:  CBC Latest Ref Rng & Units 03/31/2019 03/30/2019 03/29/2019  WBC 4.0 - 10.5 K/uL 11.5(H) 3.8(L) 6.0  Hemoglobin 13.0 - 17.0 g/dL 10.7(L) 11.3(L) 12.2(L)  Hematocrit 39.0 - 52.0 % 33.6(L) 34.8(L) 36.7(L)  Platelets 150 - 400 K/uL 217 225 221   CMP Latest Ref Rng & Units 04/01/2019 03/31/2019 03/30/2019  Glucose 70 - 99 mg/dL 146(H) 244(H) 121(H)  BUN 8 - 23 mg/dL 39(H) 34(H) 27(H)  Creatinine 0.61 - 1.24 mg/dL 1.58(H) 1.67(H) 1.41(H)  Sodium 135 - 145 mmol/L 136 135 136  Potassium 3.5 - 5.1 mmol/L 4.2 4.1 4.1  Chloride 98 - 111 mmol/L 100 99 97(L)  CO2 22 - 32  mmol/L 31 28 24   Calcium 8.9 - 10.3 mg/dL 8.0(L) 7.8(L) 8.1(L)  Total Protein 6.5 - 8.1 g/dL - - 5.6(L)  Total Bilirubin 0.3 - 1.2 mg/dL - - 1.6(H)  Alkaline Phos 38 - 126 U/L - - 62  AST 15 - 41 U/L - - 12(L)  ALT 0 - 44 U/L - - 6     Imaging studies:   KUB (04/01/2019) personally reviewed still with very distended bowels and radiologist report reviewed:  IMPRESSION: 1. Bowel gas pattern is indicative of an ileus. 2. Nasogastric tube could be advanced 8-10 cm for better positioning within the stomach, as clinically indicated.  CXR (04/01/2019) personally reviewed and discussed over the phone with the radiologist with rib fracture and question of PTX vs Effusion vs PNA:  IMPRESSION: 1. Mild to moderately displaced rib fracture on the right involving the right eighth rib laterally. 2. There is graded opacity at the right lung base medially. Question lower lobe airspace disease and effusion. 3. Rib fracture with some subtle increased lucency at the right lung base. Raising the question of subtle sign of pneumothorax. Consider expiratory upright views of the chest for further assessment to exclude small pneumothorax. 4. A call is out to the referring provider to further discuss above findings.   Assessment/Plan:  85 y.o. male with no evidence of bowel function return and still quite distended (likely due to non-functioning NGT) and now with rhonchorous breath sounds 2 Days Post-Op s/p exploratory laparotomy and lysis of adhesions  for small bowel obstruction.   - Replace NGT (18 Fr); monitor output  - Repeat CXR based on radiology findings - discussed with radiologist via telephone  - NPO + TPN; monitor nutritional labs  - pain control prn (minimize narcotics); antiemetics prn - monitor abdominal examination +/- KUBs  - Monitor on-going bowel function - medical management of comorbidities; hold home medications for now - Mobilization  encouraged; PT following  All of the above findings and recommendations were discussed with the patient, and the medical team, and all of patient's questions were answered to his expressed satisfaction.  -- Edison Simon, PA-C [facility] 04/01/2019, 7:20 AM [phone] M-F: 7am - 4pm

## 2019-04-01 NOTE — Progress Notes (Signed)
Patient NGT at 63cm at the nare. NGT secured in place, patient in no distress and per patient tube has not been manipulated. Tube flushed easy and drainage can be seen in tubing.

## 2019-04-01 NOTE — Plan of Care (Signed)
  Problem: Elimination: Goal: Will not experience complications related to urinary retention Outcome: Progressing  Patient voiding small amount post foley catheter removal.

## 2019-04-01 NOTE — Consult Note (Addendum)
PHARMACY - ADULT TOTAL PARENTERAL NUTRITION CONSULT NOTE   Pharmacy Consult for TPN Indication: Bowel obstruction  Patient Measurements: Height: 5\' 7"  (170.2 cm) Weight: 139 lb 15.9 oz (63.5 kg) IBW/kg (Calculated) : 66.1 TPN AdjBW (KG): 63.5 Body mass index is 21.93 kg/m.   Assessment:  Micheal [name] a 85 y.o.malewith Past medical history ofCOPD, CAD, type II DM, GERD, HTN, HLD, active smoker. Patient will likely have surgery today, d/t SBO and has been NPO for 5 days.    Anticoagulant: SQH Hgb 12.2 > 11.3 > 10.7 > 11.5 Plt 221 >>225 > 217 > 217 GI: PPI IV -LBM 10/31 Endo:  SSI Q4H A1c 8.3 Insulin requirements in the past 24 hours: 20 u (Novolog) Lytes: WNL -LR IVMF at 60 mL/hr Renal: 1.45 > 1.41> 1.67 > 1.58 (down from yesterday) --UOP 0.2 mL/kg/hr (down from previous day) Pulm: Dulera  Cards: metoprolol IV 5 mg Q6H Hepatobil: Neuro: ID:WBC 6 > 3.8 > 11.5 Ertapenem x1 dose (11/9 for pre-op)  TPN Access:  PICC line placement 11/9  TPN start date: 03/30/2019 Nutritional Goals (per RD recommendation on 03/30/2019): Cal: 1863 Protein: 96 g Fluid: 1.6-1.8 L   Goal TPN rate is 83 ml/hr (Provides 1863 calories and 96 grams of protein)  Current Nutrition:   Plan:  -Clinimix E 5/15 TPN at 80 mL/hr. Continue reduced LR rate of 20 mL/hr (Per Otho Ket, PA-C) for a total TPN + IVF rate at 127ml/hr.   -20% lipid emulsion 24ml daily @ 20 mL/hr x 12 hrs   -This TPN provides 384 kcal of protein and 979.2 kcal of dextrose which provides 1363.2 kCals per day, meeting 90% of patient needs  -Electrolytes in TPN: No additional  -Add MVI daily, trace elements daily, thiamin 100 mg daily, 8 units of regular insulin to TPN  -Q4H moderate/NPO SSI and adjust as needed  -Monitor TPN labs:   Albumin daily x 3 days, then at minimum Monday and Thursday  CBC day 1, then weekly  BMP, Mag, Phosphorus Daily x 3 days, then at minimum Monday and Thursday   Liver  function On day 1, then weekly   TG On day 2, then weekly  -F/U: Electrolytes with AM labs and monitor BG   Micheal [name] Micheal [name] 04/01/2019,7:41 AM

## 2019-04-01 NOTE — Evaluation (Signed)
Physical Therapy Re-Evaluation Patient Details Name: Micheal [name] MRN: [id number] DOB: 1932/09/17 Today's Date: 04/01/2019   History of Present Illness  Pt is an 85 y.o. male presenting to hospital 11/4 with several days of generalized abdominal discomfort, vomiting, and nausea.  Pt admitted with SBO.  Pt s/p 11/9 exploratory lap and lysis of adhesions.  PT continue at transfer order received post op.  PMH includes h/o balance problem, COPD, DM, htn, MI, PNA, cardiac cath, ORIF L ankle fx 01/10/18, L distal radius fx 01/10/19, anterolateral R 9th and 10th rib fx's 11/27/18.  Clinical Impression  PT re-evaluation performed (s/p exploratory laparotomy and lysis of adhesions). Prior to hospital admission, pt was independent.  Pt lives with his wife in 1 level home.  Currently pt is mod assist semi-supine to sit; max assist sit to semi-supine; mod assist to stand up to RW (increased time and cueing required for upright posture); and CGA to min assist to ambulate 50 feet with RW with close chair follow (fairly frequent cueing required for upright posture during all of sessions activities).  Pt demonstrating significant change in overall strength (increased generalized weakness now noted), activity tolerance (now decreased), and functional mobility (now increased assist with decreased distance able to ambulate) compared to initial evaluation 2 days ago (nurse and CM notified). Auditory noises noted with pt's breathing at rest and with activity (pt reporting having difficulty coughing up phlegm): nurse aware.  Pt would benefit from skilled PT to address noted impairments and functional limitations (see below for any additional details).  Upon hospital discharge, PT recommendations updated to STR (nurse and CM notified).    Follow Up Recommendations SNF    Equipment Recommendations  Rolling walker with 5" wheels;3in1 (PT);Wheelchair (measurements PT);Wheelchair cushion (measurements PT)    Recommendations  for Other Services OT consult     Precautions / Restrictions Precautions Precautions: Fall Precaution Comments: NG tube; NPO Restrictions Weight Bearing Restrictions: No      Mobility  Bed Mobility Overal bed mobility: Needs Assistance Bed Mobility: Supine to Sit;Sit to Supine     Supine to sit: Mod assist Sit to supine: Max assist   General bed mobility comments: assist for trunk>LE's semi-supine to sit (pt unable to perform on own with vc's and increased time--pt reporting LE's feeling like "lead"); assist for trunk and B LE's sit to semi-supine  Transfers Overall transfer level: Needs assistance Equipment used: Rolling walker (2 wheeled) Transfers: Sit to/from [facility] Sit to Stand: Mod assist Stand pivot transfers: Min assist;Mod assist       General transfer comment: mod assist to initiate and come to stand up to RW (x1 trial from bed; x1 trial from recliner); vc's for UE/LE placement and scooting to edge of sitting surface prior to standing  Ambulation/Gait Ambulation/Gait assistance: Min guard;Min assist;+2 safety/equipment(chair follow for safety) Gait Distance (Feet): 50 Feet Assistive device: Rolling walker (2 wheeled)   Gait velocity: significantly decreased   General Gait Details: pt with flexed posture requiring fairly consistent vc's for upright posture during ambulation; decreased B LE step length/foot clearance/heelstrike  Stairs            Wheelchair Mobility    Modified Rankin (Stroke Patients Only)       Balance Overall balance assessment: Needs assistance Sitting-balance support: Feet supported;Single extremity supported Sitting balance-Leahy Scale: Poor Sitting balance - Comments: pt requiring at least single UE support for static sitting balance   Standing balance support: During functional activity;Bilateral upper extremity supported Standing  balance-Leahy Scale: Poor Standing balance comment: pt requiring B UE  support on RW for balance during ambulation                             Pertinent Vitals/Pain Pain Assessment: Faces Faces Pain Scale: No hurt Pain Location: abdominal incision Pain Descriptors / Indicators: Sore Pain Intervention(s): Limited activity within patient's tolerance;Monitored during session;Repositioned  Difficult to obtain HR/O2 sats but eventually able to get (beginning of session HR 102 bpm with O2 sats 94% on 1 L/min O2 via nasal cannula; end of session HR 104 bpm with O2 sats 96% on 1 L/min O2 via nasal cannula.    Home Living Family/patient expects to be discharged to:: Private residence Living Arrangements: Spouse/significant other Available Help at Discharge: Family Type of Home: House Home Access: Level entry     Home Layout: One [place]: Toilet riser      Prior Function Level of Independence: Independent         Comments: H/o falls     Hand Dominance        Extremity/Trunk Assessment   Upper Extremity Assessment Upper Extremity Assessment: Generalized weakness    Lower Extremity Assessment Lower Extremity Assessment: Generalized weakness    Cervical / Trunk Assessment Cervical / Trunk Assessment: (forward head/shoulders)  Communication   Communication: No difficulties  Cognition Arousal/Alertness: Awake/alert Behavior During Therapy: Flat affect Overall Cognitive Status: Within Functional Limits for tasks assessed                                        General Comments General comments (skin integrity, edema, etc.): mild drainage noted abdominal dressing beginning/end of session.  Nursing cleared pt for participation in physical therapy.  Pt agreeable to PT session.    Exercises  transfer and gait training   Assessment/Plan    PT Assessment Patient needs continued PT services  PT Problem List Decreased strength;Decreased balance;Decreased mobility;Decreased knowledge of use of DME;Decreased  activity tolerance;Decreased knowledge of precautions;Decreased skin integrity       PT Treatment Interventions DME instruction;Gait training;Stair training;Functional mobility training;Therapeutic activities;Therapeutic exercise;Balance training;Patient/family education    PT Goals (Current goals can be found in the Care Plan section)  Acute Rehab PT Goals Patient Stated Goal: to go home PT Goal Formulation: With patient Time For Goal Achievement: 04/15/19 Potential to Achieve Goals: Fair    Frequency Min 2X/week   Barriers to discharge Decreased caregiver support      Co-evaluation               AM-PAC PT "6 Clicks" Mobility  Outcome Measure Help needed turning from your back to your side while in a flat bed without using bedrails?: A Little Help needed moving from lying on your back to sitting on the side of a flat bed without using bedrails?: A Lot Help needed moving to and from a bed to a chair (including a wheelchair)?: A Lot Help needed standing up from a chair using your arms (e.g., wheelchair or bedside chair)?: A Lot Help needed to walk in hospital room?: A Little Help needed climbing 3-5 steps with a railing? : Total 6 Click Score: 13    End of Session Equipment Utilized During Treatment: Gait belt(up higher away from incision) Activity Tolerance: Patient limited by fatigue Patient left: in bed;with call bell/phone within reach;with bed  alarm set Nurse Communication: Mobility status;Precautions PT Visit Diagnosis: Unsteadiness on feet (R26.81);Difficulty in walking, not elsewhere classified (R26.2);Muscle weakness (generalized) (M62.81);Other abnormalities of gait and mobility (R26.89);History of falling (Z91.81)    Time: [id number] PT Time Calculation (min) (ACUTE ONLY): 39 min   Charges:   PT Evaluation $PT Re-evaluation: 1 Re-eval PT Treatments $Gait Training: 8-22 mins $Therapeutic Activity: 8-22 mins       Leitha Bleak, PT 04/01/19, 1:15  PM [phone]

## 2019-04-02 ENCOUNTER — Inpatient Hospital Stay: Payer: Medicare Other

## 2019-04-02 ENCOUNTER — Encounter: Payer: Self-pay | Admitting: Radiology

## 2019-04-02 LAB — MRSA PCR SCREENING: MRSA by PCR: NEGATIVE

## 2019-04-02 LAB — COMPREHENSIVE METABOLIC PANEL
ALT: 8 U/L (ref 0–44)
AST: 12 U/L — ABNORMAL LOW (ref 15–41)
Albumin: 2 g/dL — ABNORMAL LOW (ref 3.5–5.0)
Alkaline Phosphatase: 70 U/L (ref 38–126)
Anion gap: 9 (ref 5–15)
BUN: 35 mg/dL — ABNORMAL HIGH (ref 8–23)
CO2: 29 mmol/L (ref 22–32)
Calcium: 8.1 mg/dL — ABNORMAL LOW (ref 8.9–10.3)
Chloride: 95 mmol/L — ABNORMAL LOW (ref 98–111)
Creatinine, Ser: 1.12 mg/dL (ref 0.61–1.24)
GFR calc Af Amer: >60 mL/min (ref >60)
GFR calc non Af Amer: 60 mL/min — ABNORMAL LOW (ref >60)
Glucose, Bld: 250 mg/dL — ABNORMAL HIGH (ref 70–99)
Potassium: 3.6 mmol/L (ref 3.5–5.1)
Sodium: 133 mmol/L — ABNORMAL LOW (ref 135–145)
Total Bilirubin: 0.3 mg/dL (ref 0.3–1.2)
Total Protein: 4.6 g/dL — ABNORMAL LOW (ref 6.5–8.1)

## 2019-04-02 LAB — LACTIC ACID, PLASMA: Lactic Acid, Venous: 1.8 mmol/L (ref 0.5–1.9)

## 2019-04-02 LAB — GLUCOSE, CAPILLARY
Glucose-Capillary: 174 mg/dL — ABNORMAL HIGH (ref 70–99)
Glucose-Capillary: 151 mg/dL — ABNORMAL HIGH (ref 70–99)
Glucose-Capillary: 250 mg/dL — ABNORMAL HIGH (ref 70–99)
Glucose-Capillary: 243 mg/dL — ABNORMAL HIGH (ref 70–99)
Glucose-Capillary: 167 mg/dL — ABNORMAL HIGH (ref 70–99)

## 2019-04-02 LAB — CBC
HCT: 34 % — ABNORMAL LOW (ref 39.0–52.0)
HCT: 29.4 % — ABNORMAL LOW (ref 39.0–52.0)
Hemoglobin: 11.1 g/dL — ABNORMAL LOW (ref 13.0–17.0)
Hemoglobin: 9.8 g/dL — ABNORMAL LOW (ref 13.0–17.0)
MCH: 30.7 pg (ref 26.0–34.0)
MCH: 30.6 pg (ref 26.0–34.0)
MCHC: 32.6 g/dL (ref 30.0–36.0)
MCHC: 33.3 g/dL (ref 30.0–36.0)
MCV: 94.2 fL (ref 80.0–100.0)
MCV: 91.9 fL (ref 80.0–100.0)
Platelets: 199 10*3/uL (ref 150–400)
Platelets: 176 10*3/uL (ref 150–400)
RBC: 3.61 MIL/uL — ABNORMAL LOW (ref 4.22–5.81)
RBC: 3.2 MIL/uL — ABNORMAL LOW (ref 4.22–5.81)
RDW: 14.2 % (ref 11.5–15.5)
RDW: 14.2 % (ref 11.5–15.5)
WBC: 21.7 10*3/uL — ABNORMAL HIGH (ref 4.0–10.5)
WBC: 22.5 10*3/uL — ABNORMAL HIGH (ref 4.0–10.5)
nRBC: 0 % (ref 0.0–0.2)
nRBC: 0 % (ref 0.0–0.2)

## 2019-04-02 LAB — BASIC METABOLIC PANEL
Anion gap: 7 (ref 5–15)
BUN: 38 mg/dL — ABNORMAL HIGH (ref 8–23)
CO2: 29 mmol/L (ref 22–32)
Calcium: 8.1 mg/dL — ABNORMAL LOW (ref 8.9–10.3)
Chloride: 100 mmol/L (ref 98–111)
Creatinine, Ser: 1.09 mg/dL (ref 0.61–1.24)
GFR calc Af Amer: >60 mL/min (ref >60)
GFR calc non Af Amer: >60 mL/min (ref >60)
Glucose, Bld: 186 mg/dL — ABNORMAL HIGH (ref 70–99)
Potassium: 4 mmol/L (ref 3.5–5.1)
Sodium: 136 mmol/L (ref 135–145)

## 2019-04-02 LAB — HEPATIC FUNCTION PANEL
ALT: 9 U/L (ref 0–44)
AST: 14 U/L — ABNORMAL LOW (ref 15–41)
Albumin: 2 g/dL — ABNORMAL LOW (ref 3.5–5.0)
Alkaline Phosphatase: 67 U/L (ref 38–126)
Bilirubin, Direct: 0.1 mg/dL (ref 0.0–0.2)
Indirect Bilirubin: 0.4 mg/dL (ref 0.3–0.9)
Total Bilirubin: 0.5 mg/dL (ref 0.3–1.2)
Total Protein: 4.8 g/dL — ABNORMAL LOW (ref 6.5–8.1)

## 2019-04-02 LAB — URINALYSIS, ROUTINE W REFLEX MICROSCOPIC
Bilirubin Urine: NEGATIVE
Glucose, UA: 50 mg/dL — AB
Hgb urine dipstick: NEGATIVE
Ketones, ur: NEGATIVE mg/dL
Leukocytes,Ua: NEGATIVE
Nitrite: NEGATIVE
Protein, ur: NEGATIVE mg/dL
Specific Gravity, Urine: 1.013 (ref 1.005–1.030)
pH: 5 (ref 5.0–8.0)

## 2019-04-02 LAB — MAGNESIUM
Magnesium: 1.9 mg/dL (ref 1.7–2.4)
Magnesium: 1.7 mg/dL (ref 1.7–2.4)

## 2019-04-02 LAB — TROPONIN I (HIGH SENSITIVITY)
Troponin I (High Sensitivity): 25 ng/L — ABNORMAL HIGH (ref <18)
Troponin I (High Sensitivity): 28 ng/L — ABNORMAL HIGH (ref <18)

## 2019-04-02 LAB — PHOSPHORUS
Phosphorus: 1.5 mg/dL — ABNORMAL LOW (ref 2.5–4.6)
Phosphorus: 3 mg/dL (ref 2.5–4.6)

## 2019-04-02 LAB — PROCALCITONIN: Procalcitonin: 1.28 ng/mL

## 2019-04-02 LAB — ALBUMIN: Albumin: 2.1 g/dL — ABNORMAL LOW (ref 3.5–5.0)

## 2019-04-02 MED ORDER — CHLORHEXIDINE GLUCONATE 0.12 % MT SOLN
OROMUCOSAL | Status: AC
  Administered 2019-04-02: 21:00:00 15 mL
  Filled 2019-04-02: qty 15

## 2019-04-02 MED ORDER — FAT EMULSION PLANT BASED 20 % IV EMUL
250.0000 mL | INTRAVENOUS | Status: AC
  Administered 2019-04-02: 250 mL via INTRAVENOUS
  Filled 2019-04-02: qty 250

## 2019-04-02 MED ORDER — SODIUM CHLORIDE 0.9 % IV SOLN
3.0000 g | Freq: Four times a day (QID) | INTRAVENOUS | Status: DC
  Administered 2019-04-02 – 2019-04-03 (×4): 3 g via INTRAVENOUS
  Filled 2019-04-02: qty 3
  Filled 2019-04-02: qty 8
  Filled 2019-04-02: qty 3
  Filled 2019-04-02: qty 8
  Filled 2019-04-02 (×2): qty 3
  Filled 2019-04-02: qty 8

## 2019-04-02 MED ORDER — ENOXAPARIN SODIUM 40 MG/0.4ML ~~LOC~~ SOLN
40.0000 mg | SUBCUTANEOUS | Status: DC
  Administered 2019-04-02 – 2019-04-11 (×10): 40 mg via SUBCUTANEOUS
  Filled 2019-04-02 (×10): qty 0.4

## 2019-04-02 MED ORDER — TRACE MINERALS CU-MN-SE-ZN 300-55-60-3000 MCG/ML IV SOLN
INTRAVENOUS | Status: AC
  Administered 2019-04-02: 17:00:00 via INTRAVENOUS
  Filled 2019-04-02: qty 1920

## 2019-04-02 MED ORDER — SODIUM PHOSPHATES 45 MMOLE/15ML IV SOLN
15.0000 mmol | Freq: Once | INTRAVENOUS | Status: AC
  Administered 2019-04-02: 11:00:00 15 mmol via INTRAVENOUS
  Filled 2019-04-02: qty 5

## 2019-04-02 MED ORDER — ORAL CARE MOUTH RINSE
15.0000 mL | Freq: Two times a day (BID) | OROMUCOSAL | Status: DC
  Administered 2019-04-02 – 2019-04-15 (×23): 15 mL via OROMUCOSAL

## 2019-04-02 MED ORDER — MAGNESIUM SULFATE 2 GM/50ML IV SOLN
2.0000 g | Freq: Once | INTRAVENOUS | Status: AC
  Administered 2019-04-03: 2 g via INTRAVENOUS
  Filled 2019-04-02: qty 50

## 2019-04-02 MED ORDER — POTASSIUM CHLORIDE 10 MEQ/100ML IV SOLN
10.0000 meq | INTRAVENOUS | Status: AC
  Administered 2019-04-02 – 2019-04-03 (×4): 10 meq via INTRAVENOUS
  Filled 2019-04-02 (×4): qty 100

## 2019-04-02 MED ORDER — IOHEXOL 9 MG/ML PO SOLN
500.0000 mL | ORAL | Status: AC
  Administered 2019-04-02 (×2): 500 mL via ORAL

## 2019-04-02 MED ORDER — SODIUM CHLORIDE 0.9 % IV SOLN
INTRAVENOUS | Status: DC | PRN
  Administered 2019-04-02 – 2019-04-08 (×2): 250 mL via INTRAVENOUS
  Administered 2019-04-10: 30 mL via INTRAVENOUS
  Administered 2019-04-11: 10 mL via INTRAVENOUS
  Administered 2019-04-12: 100 mL via INTRAVENOUS

## 2019-04-02 MED ORDER — IOHEXOL 9 MG/ML PO SOLN: 500.0000 mL | ORAL | Status: DC

## 2019-04-02 MED ORDER — IOHEXOL 300 MG/ML  SOLN
100.0000 mL | Freq: Once | INTRAMUSCULAR | Status: AC | PRN
  Administered 2019-04-02: 100 mL via INTRAVENOUS

## 2019-04-02 NOTE — Care Management Important Message (Signed)
Important Message  Patient Details  Name: Micheal [name] MRN: [id number] Date of Birth: Aug 19, 1932   Medicare Important Message Given:  Yes     Dannette Barbara 04/02/2019, 1:35 PM

## 2019-04-02 NOTE — Consult Note (Signed)
PHARMACY - ADULT TOTAL PARENTERAL NUTRITION CONSULT NOTE   Pharmacy Consult for TPN Indication: Bowel obstruction  Patient Measurements: Height: 5\' 7"  (170.2 cm) Weight: 139 lb 15.9 oz (63.5 kg) IBW/kg (Calculated) : 66.1 TPN AdjBW (KG): 63.5 Body mass index is 21.93 kg/m.   Assessment:  Micheal [name] a 85 y.o.malewith Past medical history ofCOPD, CAD, type II DM, GERD, HTN, HLD, active smoker. Patient will likely have surgery today, d/t SBO and has been NPO days.  Since starting the TPN, the patient's blood glucose has been uncontrolled particularly around the time a new bag is given. Will continue to monitor BG.   Phosphorus 1.5- hypophosphatemia and will need replacement in addition to what is already in the TPN    Anticoagulant: SQH Hgb 12.2 > 11.3 > 10.7 > 11.5 > 11.1 Plt 221 >>225 > 217 > 217 > 199 GI: PPI IV -LBM 10/31 Endo:  SSI Q4H A1c 8.3 Insulin requirements in the past 24 hours: 24 u (Novolog) Lytes: WNL -LR IVMF at 20 mL/hr ==> discontinued 11/12 Renal: 1.45 > 1.41> 1.67 > 1.58> 1.09 --UOP 0.4 mL/kg/hr (down from previous day) Pulm: Dulera  Cards: metoprolol IV 5 mg Q6H Hepatobil: Neuro: ID:WBC 6 > 3.8 > 11.5 > 21.7 11/12 Aspiration PNA  Ertapenem x1 dose (11/9 for pre-op)  TPN Access:  PICC line placement 11/9  TPN start date: 03/30/2019 Nutritional Goals (per RD recommendation on 03/30/2019): Cal: 1863 Protein: 96 g Fluid: 1.6-1.8 L   Goal TPN rate is 83 ml/hr (Provides 1863 calories and 96 grams of protein)  Current Nutrition:   Plan:  -Clinimix E 5/15 TPN at 80 mL/hr. LR discontinued this morning d/t fluid overload.   -20% lipid emulsion 238ml daily @ 20 mL/hr x 12 hrs   -This TPN provides 384 kcal of protein and 979.2 kcal of dextrose which provides 1363.2 kCals per day, meeting 90% of patient needs  -Electrolytes in TPN: No additional  - Will order Sodium Phosphate 15 mmol x1 dose, to start at 1000 today.   - Add MVI daily,  trace elements daily, thiamin 100 mg daily, 14 units of regular insulin to TPN  -Q4H moderate/NPO SSI and adjust as needed  -Monitor TPN labs:   Albumin daily x 3 days, then at minimum Monday and Thursday  CBC day 1, then weekly  BMP, Mag, Phosphorus Daily x 3 days, then at minimum Monday and Thursday  Liver function On day 1, then weekly  TG On day 2, then weekly  -F/U: Electrolytes with AM labs and monitor BG   Claudia Greenley R Dela Sweeny 04/02/2019,7:37 AM

## 2019-04-02 NOTE — Consult Note (Signed)
Pharmacy Antibiotic Note  Micheal [name] is a 85 y.o. male admitted on 03/25/2019 with aspiration pneumonia .  Pharmacy has been consulted for Unasyn dosing.  NKDA   Plan: Unasyn 3g Q6H   Height: 5\' 7"  (170.2 cm) Weight: 139 lb 15.9 oz (63.5 kg) IBW/kg (Calculated) : 66.1  Temp (24hrs), Avg:98.6 F (37 C), Min:98 F (36.7 C), Max:99.2 F (37.3 C)  Recent Labs  Lab 03/27/19 0544 03/29/19 0435 03/30/19 0528 03/31/19 0605 04/01/19 0507 04/02/19 0441 04/02/19 0547  WBC 13.6* 6.0 3.8* 11.5*  --  21.7*  --   CREATININE 1.66* 1.45* 1.41* 1.67* 1.58* 1.09  --   LATICACIDVEN  --   --  0.8  --   --   --  1.8    Estimated Creatinine Clearance: 45.3 mL/min (by C-G formula based on SCr of 1.09 mg/dL).    No Known Allergies  Antimicrobials this admission: 11/12 Unasyn >>   Microbiology results: 11/12 Sputum: pending   11/12 COVID: Negative   Thank you for allowing pharmacy to be a part of this patient's care.  Rowland Lathe 04/02/2019 7:57 AM

## 2019-04-02 NOTE — TOC Progression Note (Signed)
Transition of Care [facility]) - Progression Note    Patient Details  Name: Micheal [name] MRN: [id number] Date of Birth: 14-Jul-1932  [facility]) CM/SW Contact  Beverly Sessions, RN Phone Number: 04/02/2019, 4:57 PM  Clinical Narrative:     PT has assessed patient and recommends SNF Patient with change in condition, and transferring to stepdown unit.  Will defer discussion of SNF till a later time        Expected Discharge Plan and Services                                                 Social Determinants of Health (SDOH) Interventions    Readmission Risk Interventions Readmission Risk Prevention Plan 01/12/2018  Transportation Screening Complete  PCP or Specialist Appt within 5-7 Days Complete  Home Care Screening Complete  Medication Review (RN CM) Complete  Some recent data might be hidden

## 2019-04-02 NOTE — Progress Notes (Signed)
[facility] Day(s): 8.   Post op day(s): 3 Days Post-Op.   Interval History:  Patient seen and examined no acute events or new complaints overnight.  Patient reports he is not feeling good this morning. Still with abdominal distension. No nausea or emesis. Still with rhonchorous breath sounds but may be uper airway secretions Increasing leukocytosis to 21K, tachycardic to 113, no fever NGT 150 recorded Worked with PT yesterday - recommending SNF  Vital signs in last 24 hours: [min-max] current  Temp:  [98 F (36.7 C)-99.2 F (37.3 C)] 98 F (36.7 C) (11/12 0348) Pulse Rate:  [101-113] 113 (11/12 0348) Resp:  [18-24] 20 (11/12 0348) BP: (137-149)/(56-59) 149/58 (11/12 0348) SpO2:  [90 %-96 %] 90 % (11/12 0348)     Height: 5\' 7"  (170.2 cm) Weight: 63.5 kg BMI (Calculated): 21.92   Intake/Output last 2 shifts:  11/11 0701 - 11/12 0700 In: 4127.1 [I.V.:4072.1; NG/GT:55] Out: 700 [Urine:550; Emesis/NG output:150]   Physical Exam:  Constitutional: alert, cooperative and no distress HEENT: NGT in place - does not appear to be working Respiratory: breathing non-labored at rest, rhonchorous breath sounds which may be upper airway Cardiovascular: tachycardic and sinus rhythm  Gastrointestinal: soft,incisional soreness, improvement in abdominal distension, no rebound/guarding. Integumentary:Laparotomy incision is CDI with staples and honeycomb, no erythema or drainage   Labs:  CBC Latest Ref Rng & Units 04/02/2019 03/31/2019 03/30/2019  WBC 4.0 - 10.5 K/uL 21.7(H) 11.5(H) 3.8(L)  Hemoglobin 13.0 - 17.0 g/dL 11.1(L) 10.7(L) 11.3(L)  Hematocrit 39.0 - 52.0 % 34.0(L) 33.6(L) 34.8(L)  Platelets 150 - 400 K/uL 199 217 225   CMP Latest Ref Rng & Units 04/02/2019 04/01/2019 03/31/2019  Glucose 70 - 99 mg/dL 186(H) 146(H) 244(H)  BUN 8 - 23 mg/dL 38(H) 39(H) 34(H)  Creatinine 0.61 - 1.24 mg/dL 1.09 1.58(H) 1.67(H)  Sodium 135 - 145  mmol/L 136 136 135  Potassium 3.5 - 5.1 mmol/L 4.0 4.2 4.1  Chloride 98 - 111 mmol/L 100 100 99  CO2 22 - 32 mmol/L 29 31 28   Calcium 8.9 - 10.3 mg/dL 8.1(L) 8.0(L) 7.8(L)  Total Protein 6.5 - 8.1 g/dL - - -  Total Bilirubin 0.3 - 1.2 mg/dL - - -  Alkaline Phos 38 - 126 U/L - - -  AST 15 - 41 U/L - - -  ALT 0 - 44 U/L - - -     Imaging studies:   CXR (04/02/2019) personally reviewed and agree with radiologist interpretation below:  IMPRESSION: 1. Interim placement of NG tube, its tip is projected over the upper stomach.  2. Progressive bibasilar pulmonary infiltrates/edema. Tiny bilateral pleural effusions again noted.  3.  Right eighth rib fracture again noted.  No pneumothorax.   KUB (04/02/2019) personally reviewed still with bowel dilation, and radiologist interpretation reviewed below:  IMPRESSION: 1. NG tube noted with tip in the upper most portion of the stomach. Side hole is at the gastroesophageal junction. NG tube advancement of approximately 10 cm should be considered.  2. Surgical staples noted over the abdomen. Distended loops of small large bowel are again noted suggesting adynamic ileus. Continued follow-up exam suggested to demonstrate resolution.   Assessment/Plan:  85 y.o. male with lack of clinical improvement and worsening leukocytosis/tachycardia 3 Days Post-Op s/p  exploratory laparotomy and lysis of adhesionsfor small bowel obstruction.   - Will consult pharmacy; start Unasyn for concern of possible PNA  - Consult respiratory therapy to assist with respiratory status  -  Advance NGT; continue to LIS; monitor output  - Will repeat CT Abdomen/Pelvis with CT Chest to re-evaluate the abdomen for any missed etiology and to further evaluate the chest given rhonchorous breath sounds,. leukocytosis, and tachycardia.   - NPO + TPN; monitor nutritional labs             - pain control prn (minimize narcotics); antiemetics prn - monitor  abdominal examination +/- KUBs             - Monitor on-going bowel function - medical management of comorbidities; hold home medications for now - Mobilization encouraged; PT following  All of the above findings and recommendations were discussed with the patient, and the medical team, and all of patient's questions were answered tohisexpressed satisfaction.  -- Micheal Simon, PA-C [facility] 04/02/2019, 7:28 AM [phone] M-F: 7am - 4pm

## 2019-04-02 NOTE — Progress Notes (Signed)
PROGRESS NOTE  CRISTIANO AKKERMAN [id number] DOB: 07/16/1932 DOA: 03/25/2019 PCP: Gweneth Fritter, MD  HPI/Recap of past 24 hours: [name] a 85 y.o.malewith Past medical history ofCOPD, CAD, type II DM, GERD, HTN, HLD, active smoker. Patient presented to the ED on 11/5 from home with complaint of abdominal pain, nausea and vomiting, last bowel movement 10/31. CT abdomen showed high-grade small bowel obstruction with transition point in the central lower abdomen potentially due to an adhesion.   He was admitted under general surgery service.  Medical consultation was obtained for comanagement of medical issues. Patient failed conservative management for bowel obstruction. 11/9, underwent exploratory laparotomy with adhesiolysis.  04/02/19: Patient was seen and examined at his bedside this morning.  Worsening rhonchorous sounds with increasing oxygen demand overnight.  Chest x-ray and leukocytosis suggestive of aspiration pneumonia.  Adynamic ileus on abdominal x-ray.  Sputum culture and blood cultures ordered.  CT chest, abdomen and pelvis ordered by general surgery to further assess worsening clinical change.   Assessment/Plan: Principal Problem:   SBO (small bowel obstruction) (HCC) Active Problems:   Type 2 diabetes mellitus, uncontrolled, with renal complications (HCC)   COPD (chronic obstructive pulmonary disease) (HCC)   Gastroesophageal reflux disease without esophagitis   HTN (hypertension)   Smoker   Acute renal failure superimposed on stage 3b chronic kidney disease (HCC)   Hyponatremia   Leucocytosis  Small bowel obstruction -Patient failed conservative management for bowel obstruction. -11/9, underwent exploratory laparotomy with adhesiolysis. -Continue n.p.o. due to evidence of persistent ileus -On TPN, continue. Continue serial abdominal x-ray Mobilize as tolerated Goal potassium greater than 4.0. Continue daily BMPs with close monitoring of  electrolytes Continue TPN and lactated Ringer's IV fluid hydration  Sepsis suspect secondary to aspiration pneumonia versus others Leukocytosis 21K, tachycardia and elevated procalcitonin Chest x-ray done on 04/02/2019 subjective of aspiration pneumonia Started on Unasyn Sputum culture and blood cultures x2 pending  Acute hypoxic respiratory failure suspect secondary to aspiration pneumonia Not on oxygen supplementation at baseline  O2 saturation 90% on 1 L Start continuous pulse oximetry Maintain O2 saturation greater than 92% GEN surgery start Unasyn on 04/02/2019 due to suspicion for aspiration pneumonia Independently viewed chest x-ray done on 03/25/2019 which showed right medial right lower lobe infiltrate and left lower lobe infiltrates Procalcitonin elevated 1.28 with leukocytosis WBC 21K Chest CT ordered to further assess Continue bronchodilators and RT suctioning  Resolved AKI  Baseline creatinine appears to be 1.0 with GFR greater than 60 Presented with creatinine 2.1 with GFR of 27 Creatinine 1.09 with GFR greater than 60 on 04/02/2019, back to his baseline Continue to monitor urine output Continue to avoid nephrotoxins Continue daily BMP  Small bilateral pleural effusions On antibiotics Continue to monitor volume status  Severe protein calorie malnutrition Albumin 2.1 BMI 21 with muscle loss On TPN managed by pharmacy  Hypophosphatemia Phosphorus 1.5 Replete  Right eighth rib fracture, incidentally found Continue close monitoring and address as indicated  Resolved hypovolemic hyponatremia Serum sodium 136 on 04/01/2019  T2DM with hyperglycemia-hemoglobin A1c 8.3. -Continue to hold Home meds include glipizide and Metformin.  -Continue sliding scale insulin with Accu-Cheks.  Cardiovascular issues: HTN/HLD/CAD -Home meds include carvedilol, lisinopril, aspirin, statin. -Currently all of them on hold.  Continue IV Lopressor as needed with  parameters Avoid hydralazine in the setting of tachycardia  COPD -Stable.  Continue bronchodilators  GERD -start IV PPI  Current daily smoker -tobacco cessation counseling at bedside.  BPH -Flomax on hold due  to n.p.o.  Monitor urine output.  Mobility: Encourage ambulation DVT prophylaxis: Heparin subcu 3 times daily Code Status:  Code Status: Full Code  Family Communication:None at bedside Expected Discharge:  Plan to discharge once general surgery signs off and patient is hemodynamically stable  .Consultants:  General surgery  Procedures:  NG tube placement.      Objective: Vitals:   04/01/19 1800 04/01/19 1950 04/01/19 2206 04/02/19 0348  BP:  (!) 138/56  (!) 149/58  Pulse:  (!) 107 (!) 101 (!) 113  Resp:  (!) 24 20 20   Temp:  99.2 F (37.3 C)  98 F (36.7 C)  TempSrc:  Axillary  Oral  SpO2: 95% 94%  90%  Weight:      Height:        Intake/Output Summary (Last 24 hours) at 04/02/2019 0914 Last data filed at 04/02/2019 0421 Gross per 24 hour  Intake 2176.17 ml  Output 700 ml  Net 1476.17 ml   Filed Weights   03/25/19 1343 03/30/19 1543  Weight: 63.5 kg 63.5 kg    Exam:   General: 85 y.o. year-old male well-appearing with audible rhonchorous sounds.  NG tube in place to suction, appears uncomfortable.  Alert oriented x4.  Cardiovascular: Tachycardia no rubs or gallops.  Respiratory: Audible rhonchorous sounds.  Poor inspiratory effort.   Abdomen: Distended with hypoactive bowel sounds.  Musculoskeletal: Trace lower extremity edema.   Psychiatry: Mood is appropriate for condition and setting.  Data Reviewed: CBC: Recent Labs  Lab 03/27/19 0544 03/29/19 0435 03/30/19 0528 03/31/19 0605 04/02/19 0441  WBC 13.6* 6.0 3.8* 11.5* 21.7*  NEUTROABS 11.7* 4.4 2.1 10.2*  --   HGB 11.3* 12.2* 11.3* 10.7* 11.1*  HCT 35.7* 36.7* 34.8* 33.6* 34.0*  MCV 93.9 91.1 92.8 93.9 94.2  PLT 217 221 225 217 [id number]   Basic Metabolic Panel: Recent  Labs  Lab 03/29/19 0435 03/30/19 0528 03/30/19 1002 03/31/19 0605 04/01/19 0507 04/02/19 0441  NA 139 136  --  135 136 136  K 4.2 4.1  --  4.1 4.2 4.0  CL 98 97*  --  99 100 100  CO2 27 24  --  28 31 29   GLUCOSE 111* 121*  --  244* 146* 186*  BUN 32* 27*  --  34* 39* 38*  CREATININE 1.45* 1.41*  --  1.67* 1.58* 1.09  CALCIUM 8.5* 8.1*  --  7.8* 8.0* 8.1*  MG  --   --  1.7 2.1 2.0 1.9  PHOS  --   --  2.8 3.6 3.0 1.5*   GFR: Estimated Creatinine Clearance: 45.3 mL/min (by C-G formula based on SCr of 1.09 mg/dL). Liver Function Tests: Recent Labs  Lab 03/30/19 1002 03/31/19 0605 04/01/19 0507 04/02/19 0441  AST 12*  --   --   --   ALT 6  --   --   --   ALKPHOS 62  --   --   --   BILITOT 1.6*  --   --   --   PROT 5.6*  --   --   --   ALBUMIN 2.8* 2.1* 2.1* 2.1*   No results for input(s): LIPASE, AMYLASE in the last 168 hours. No results for input(s): AMMONIA in the last 168 hours. Coagulation Profile: No results for input(s): INR, PROTIME in the last 168 hours. Cardiac Enzymes: No results for input(s): CKTOTAL, CKMB, CKMBINDEX, TROPONINI in the last 168 hours. BNP (last 3 results) No results for input(s): PROBNP in the last  8760 hours. HbA1C: No results for input(s): HGBA1C in the last 72 hours. CBG: Recent Labs  Lab 04/01/19 1144 04/01/19 1657 04/01/19 1947 04/01/19 2310 04/02/19 0346  GLUCAP 122* 259* 255* 159* 174*   Lipid Profile: Recent Labs    03/31/19 0610  TRIG 132   Thyroid Function Tests: No results for input(s): TSH, T4TOTAL, FREET4, T3FREE, THYROIDAB in the last 72 hours. Anemia Panel: No results for input(s): VITAMINB12, FOLATE, FERRITIN, TIBC, IRON, RETICCTPCT in the last 72 hours. Urine analysis:    Component Value Date/Time   COLORURINE YELLOW (A) 03/27/2019 1222   APPEARANCEUR CLEAR (A) 03/27/2019 1222   LABSPEC 1.016 03/27/2019 1222   PHURINE 5.0 03/27/2019 1222   GLUCOSEU NEGATIVE 03/27/2019 1222   HGBUR SMALL (A) 03/27/2019 1222    BILIRUBINUR NEGATIVE 03/27/2019 1222   KETONESUR 20 (A) 03/27/2019 1222   PROTEINUR NEGATIVE 03/27/2019 1222   NITRITE NEGATIVE 03/27/2019 1222   LEUKOCYTESUR SMALL (A) 03/27/2019 1222   Sepsis Labs: @LABRCNTIP (procalcitonin:4,lacticidven:4)  ) Recent Results (from the past 240 hour(s))  SARS Coronavirus 2 by RT PCR (hospital order, performed in [place] hospital lab) Nasopharyngeal Nasopharyngeal Swab     Status: None   Collection Time: 03/25/19  5:16 PM   Specimen: Nasopharyngeal Swab  Result Value Ref Range Status   SARS Coronavirus 2 NEGATIVE NEGATIVE Final    Comment: (NOTE) If result is NEGATIVE SARS-CoV-2 target nucleic acids are NOT DETECTED. The SARS-CoV-2 RNA is generally detectable in upper and lower  respiratory specimens during the acute phase of infection. The lowest  concentration of SARS-CoV-2 viral copies this assay can detect is 250  copies / mL. A negative result does not preclude SARS-CoV-2 infection  and should not be used as the sole basis for treatment or other  patient management decisions.  A negative result may occur with  improper specimen collection / handling, submission of specimen other  than nasopharyngeal swab, presence of viral mutation(s) within the  areas targeted by this assay, and inadequate number of viral copies  (<250 copies / mL). A negative result must be combined with clinical  observations, patient history, and epidemiological information. If result is POSITIVE SARS-CoV-2 target nucleic acids are DETECTED. The SARS-CoV-2 RNA is generally detectable in upper and lower  respiratory specimens dur ing the acute phase of infection.  Positive  results are indicative of active infection with SARS-CoV-2.  Clinical  correlation with patient history and other diagnostic information is  necessary to determine patient infection status.  Positive results do  not rule out bacterial infection or co-infection with other viruses. If result is  PRESUMPTIVE POSTIVE SARS-CoV-2 nucleic acids MAY BE PRESENT.   A presumptive positive result was obtained on the submitted specimen  and confirmed on repeat testing.  While 2019 novel coronavirus  (SARS-CoV-2) nucleic acids may be present in the submitted sample  additional confirmatory testing may be necessary for epidemiological  and / or clinical management purposes  to differentiate between  SARS-CoV-2 and other Sarbecovirus currently known to infect humans.  If clinically indicated additional testing with an alternate test  methodology [id number]) is advised. The SARS-CoV-2 RNA is generally  detectable in upper and lower respiratory sp ecimens during the acute  phase of infection. The expected result is Negative. Fact Sheet for Patients:  StrictlyIdeas.no Fact Sheet for Healthcare Providers: BankingDealers.co.za This test is not yet approved or cleared by the [place] FDA and has been authorized for detection and/or diagnosis of SARS-CoV-2 by FDA under an Emergency  Use Authorization (EUA).  This EUA will remain in effect (meaning this test can be used) for the duration of the COVID-19 declaration under Section 564(b)(1) of the Act, 21 U.S.C. section 360bbb-3(b)(1), unless the authorization is terminated or revoked sooner. Performed at [facility], [address] [place], [place] [address]       Studies: Dg Chest 2 View  Result Date: 04/01/2019 CLINICAL DATA:  Cough and abdominal soreness. Patient diagnosed with a right rib fracture on plain film of the chest earlier today. EXAM: CHEST - 2 VIEW COMPARISON:  Plain film of the chest earlier today. FINDINGS: NG tube has been removed. Right PICC remains in place. The patient has small bilateral pleural effusions and basilar atelectasis, greater on the right. No pneumothorax. Right eighth rib fracture again seen. IMPRESSION: Negative for pneumothorax. Small bilateral  pleural effusions and basilar atelectasis, greater on the right. Right eighth rib fracture. Electronically Signed   By: Inge Rise M.D.   On: 04/01/2019 10:47   Dg Chest Port 1 View  Result Date: 04/02/2019 CLINICAL DATA:  Hypoxia. EXAM: PORTABLE CHEST 1 VIEW COMPARISON:  04/01/2019.  01/09/2018. FINDINGS: NG tube noted on today's exam. Tip projected over the upper stomach. Right PICC line stable position. Heart size normal. Progressive bibasilar pulmonary infiltrates/edema. Stable nodular opacity over the right base, most likely nipple shadow. Small pleural effusions unchanged. No pneumothorax. Right eighth rib fracture again noted. IMPRESSION: 1. Interim placement of NG tube, its tip is projected over the upper stomach. 2. Progressive bibasilar pulmonary infiltrates/edema. Tiny bilateral pleural effusions again noted. 3.  Right eighth rib fracture again noted.  No pneumothorax. Electronically Signed   By: Marcello Moores  Register   On: 04/02/2019 06:54   Dg Abd Portable 1v  Result Date: 04/02/2019 CLINICAL DATA:  Small-bowel obstruction. EXAM: PORTABLE ABDOMEN - 1 VIEW COMPARISON:  04/01/2019.  CT 03/25/2019. FINDINGS: NG tube noted with tip in the upper most portion stomach. Side hole at the gastroesophageal junction. NG tube advancement of approximately 10 cm should be considered. Surgical staples are noted over the abdomen. Distended loops of small and large bowel are again noted suggesting adynamic ileus. Continued follow-up exam suggested to demonstrate resolution. No free intraperitoneal air. Aortoiliac vascular calcifications noted. Degenerative change lumbar spine and both hips. IMPRESSION: 1. NG tube noted with tip in the upper most portion of the stomach. Side hole is at the gastroesophageal junction. NG tube advancement of approximately 10 cm should be considered. 2. Surgical staples noted over the abdomen. Distended loops of small large bowel are again noted suggesting adynamic ileus. Continued  follow-up exam suggested to demonstrate resolution. Electronically Signed   By: Marcello Moores  Register   On: 04/02/2019 07:05   Dg Abd Portable 1v  Result Date: 04/01/2019 CLINICAL DATA:  NG tube placement EXAM: PORTABLE ABDOMEN - 1 VIEW COMPARISON:  April 01, 2019 FINDINGS: The enteric tube tip now projects over the gastric body. There is gaseous distention of loops of small bowel and colon. There is persistent blunting of the right costophrenic angle. There is an acute to subacute fracture involving the ninth rib anterior laterally on the right. IMPRESSION: NG tube projects over the stomach. Electronically Signed   By: Constance Holster M.D.   On: 04/01/2019 15:17    Scheduled Meds:  Chlorhexidine Gluconate Cloth  6 each Topical Daily   heparin  5,000 Units Subcutaneous Q8H   insulin aspart  0-15 Units Subcutaneous Q4H   iohexol  500 mL Oral Q1H   metoprolol  tartrate  5 mg Intravenous Q6H   mometasone-formoterol  2 puff Inhalation BID   nicotine  14 mg Transdermal Daily   pantoprazole (PROTONIX) IV  40 mg Intravenous QHS   polyvinyl alcohol  1 drop Both Eyes BID   tamsulosin  0.4 mg Oral Daily    Continuous Infusions:  .TPN (CLINIMIX-E) Adult 80 mL/hr at 04/02/19 0421   sodium chloride     ampicillin-sulbactam (UNASYN) IV     sodium phosphate  Dextrose 5% IVPB       LOS: 8 days     Kayleen Memos, MD [facility] Pager [phone]  If 7PM-7AM, please contact night-coverage [email or web address] Password [facility] 04/02/2019, 9:14 AM

## 2019-04-02 NOTE — Progress Notes (Addendum)
Attempted to Nasotracheally suction patient, per MD request, due to coarse upper airway breath sounds. Attempted to pass a #14 french suction catheter through left nare and was unable to maneuver pass NG tube in the back of patients nasal cavity. Using a smaller size suction catheter was not an option due to the thickness of patients secretions. Patient was coached to cough, patient gave his best effort, patient was able to cough up a copious amount of thick yellow/tan tenacious sputum. Patients upper airway breath sounds improved greatly after this was cleared.

## 2019-04-02 NOTE — Progress Notes (Signed)
Patient assessed for suctioning per MD order. RT was able to coach patient to cough enough to bring secretions up to his oral cavity where they were suctioned out with a yankauer suction device. Patients cough is congested and a moderate amount of secretions were able to be suctioned out of his mouth. Those secretions were thick and mostly light beige to tan to in color. RN notified.

## 2019-04-03 ENCOUNTER — Inpatient Hospital Stay: Payer: Medicare Other

## 2019-04-03 LAB — GLUCOSE, CAPILLARY
Glucose-Capillary: 155 mg/dL — ABNORMAL HIGH (ref 70–99)
Glucose-Capillary: 156 mg/dL — ABNORMAL HIGH (ref 70–99)
Glucose-Capillary: 157 mg/dL — ABNORMAL HIGH (ref 70–99)
Glucose-Capillary: 159 mg/dL — ABNORMAL HIGH (ref 70–99)
Glucose-Capillary: 164 mg/dL — ABNORMAL HIGH (ref 70–99)
Glucose-Capillary: 165 mg/dL — ABNORMAL HIGH (ref 70–99)
Glucose-Capillary: 177 mg/dL — ABNORMAL HIGH (ref 70–99)
Glucose-Capillary: 220 mg/dL — ABNORMAL HIGH (ref 70–99)

## 2019-04-03 LAB — PHOSPHORUS: Phosphorus: 2.3 mg/dL — ABNORMAL LOW (ref 2.5–4.6)

## 2019-04-03 LAB — CBC WITH DIFFERENTIAL/PLATELET
Abs Immature Granulocytes: 0.87 10*3/uL — ABNORMAL HIGH (ref 0.00–0.07)
Basophils Absolute: 0.2 10*3/uL — ABNORMAL HIGH (ref 0.0–0.1)
Basophils Relative: 1 %
Eosinophils Absolute: 0.1 10*3/uL (ref 0.0–0.5)
Eosinophils Relative: 1 %
HCT: 27 % — ABNORMAL LOW (ref 39.0–52.0)
Hemoglobin: 9.2 g/dL — ABNORMAL LOW (ref 13.0–17.0)
Immature Granulocytes: 4 %
Lymphocytes Relative: 6 %
Lymphs Abs: 1.3 10*3/uL (ref 0.7–4.0)
MCH: 30.6 pg (ref 26.0–34.0)
MCHC: 34.1 g/dL (ref 30.0–36.0)
MCV: 89.7 fL (ref 80.0–100.0)
Monocytes Absolute: 1.6 10*3/uL — ABNORMAL HIGH (ref 0.1–1.0)
Monocytes Relative: 7 %
Neutro Abs: 18.7 10*3/uL — ABNORMAL HIGH (ref 1.7–7.7)
Neutrophils Relative %: 81 %
Platelets: 173 10*3/uL (ref 150–400)
RBC: 3.01 MIL/uL — ABNORMAL LOW (ref 4.22–5.81)
RDW: 14.1 % (ref 11.5–15.5)
Smear Review: NORMAL
WBC: 22.8 10*3/uL — ABNORMAL HIGH (ref 4.0–10.5)
nRBC: 0 % (ref 0.0–0.2)

## 2019-04-03 LAB — BASIC METABOLIC PANEL
Anion gap: 7 (ref 5–15)
BUN: 35 mg/dL — ABNORMAL HIGH (ref 8–23)
CO2: 31 mmol/L (ref 22–32)
Calcium: 7.8 mg/dL — ABNORMAL LOW (ref 8.9–10.3)
Chloride: 95 mmol/L — ABNORMAL LOW (ref 98–111)
Creatinine, Ser: 1.14 mg/dL (ref 0.61–1.24)
GFR calc Af Amer: >60 mL/min (ref >60)
GFR calc non Af Amer: 59 mL/min — ABNORMAL LOW (ref >60)
Glucose, Bld: 209 mg/dL — ABNORMAL HIGH (ref 70–99)
Potassium: 4.2 mmol/L (ref 3.5–5.1)
Sodium: 133 mmol/L — ABNORMAL LOW (ref 135–145)

## 2019-04-03 LAB — MAGNESIUM: Magnesium: 1.6 mg/dL — ABNORMAL LOW (ref 1.7–2.4)

## 2019-04-03 MED ORDER — SODIUM CHLORIDE 0.9 % IV SOLN
500.0000 mg | INTRAVENOUS | Status: DC
  Administered 2019-04-03 – 2019-04-06 (×4): 500 mg via INTRAVENOUS
  Filled 2019-04-03 (×5): qty 500

## 2019-04-03 MED ORDER — MAGNESIUM SULFATE 2 GM/50ML IV SOLN
2.0000 g | Freq: Once | INTRAVENOUS | Status: AC
  Administered 2019-04-03: 2 g via INTRAVENOUS
  Filled 2019-04-03: qty 50

## 2019-04-03 MED ORDER — TRACE MINERALS CR-CU-MN-SE-ZN 10-1000-500-60 MCG/ML IV SOLN
INTRAVENOUS | Status: AC
  Administered 2019-04-03: 17:00:00 via INTRAVENOUS
  Filled 2019-04-03: qty 1920

## 2019-04-03 MED ORDER — SODIUM CHLORIDE 0.9 % IV SOLN
2.0000 g | Freq: Two times a day (BID) | INTRAVENOUS | Status: DC
  Administered 2019-04-03 – 2019-04-07 (×9): 2 g via INTRAVENOUS
  Filled 2019-04-03 (×11): qty 2

## 2019-04-03 MED ORDER — FAT EMULSION PLANT BASED 20 % IV EMUL
250.0000 mL | INTRAVENOUS | Status: AC
  Administered 2019-04-03: 18:00:00 250 mL via INTRAVENOUS
  Filled 2019-04-03: qty 250

## 2019-04-03 MED ORDER — SODIUM PHOSPHATES 45 MMOLE/15ML IV SOLN
30.0000 mmol | Freq: Once | INTRAVENOUS | Status: AC
  Administered 2019-04-03: 07:00:00 30 mmol via INTRAVENOUS
  Filled 2019-04-03: qty 10

## 2019-04-03 MED ORDER — METRONIDAZOLE IN NACL 5-0.79 MG/ML-% IV SOLN
500.0000 mg | Freq: Three times a day (TID) | INTRAVENOUS | Status: DC
  Administered 2019-04-03 – 2019-04-06 (×8): 500 mg via INTRAVENOUS
  Filled 2019-04-03 (×11): qty 100

## 2019-04-03 NOTE — Progress Notes (Signed)
PT Cancellation Note  Patient Details Name: Micheal [name] MRN: [id number] DOB: Nov 01, 1932   Cancelled Treatment:    Reason Eval/Treat Not Completed: Medical issues which prohibited therapy(Per chart review, patient transferred to CCU due to change in medical status. Per guidelines, patient to require new orders to resume PT services.  Will complete order at this time; pleae re-consult as medically appropriate.)   Catalaya Garr H. Owens Shark, PT, DPT, NCS 04/03/19, 3:09 PM [phone]

## 2019-04-03 NOTE — Progress Notes (Signed)
Inpatient Diabetes Program Recommendations  AACE/ADA: New Consensus Statement on Inpatient Glycemic Control (2015)  Target Ranges:  Prepandial:   less than 140 mg/dL      Peak postprandial:   less than 180 mg/dL (1-2 hours)      Critically ill patients:  140 - 180 mg/dL   Results for Micheal [name], Micheal [name] (MRN [id number]) as of 04/03/2019 10:28  Ref. Range 04/01/2019 23:10 04/02/2019 03:46 04/02/2019 07:46 04/02/2019 12:17 04/02/2019 16:35 04/02/2019 20:33  Glucose-Capillary Latest Ref Range: 70 - 99 mg/dL 159 (H)  3 units NOVOLOG  174 (H)  3 units NOVOLOG  151 (H)  3 units NOVOLOG  250 (H)  5 units NOVOLOG  243 (H)  5 units NOVOLOG  167 (H)  3 units NOVOLOG    Results for Micheal [name], Micheal [name] (MRN [id number]) as of 04/03/2019 10:28  Ref. Range 04/03/2019 00:06 04/03/2019 03:56 04/03/2019 07:31  Glucose-Capillary Latest Ref Range: 70 - 99 mg/dL 165 (H)  3 units NOVOLOG  177 (H)  3 units NOVOLOG  157 (H)  3 units NOVOLOG      Admit with: SBO  History: DM  Home DM Meds: Metformin 1000 mg QPM       Glipizide 2.5 mg Daily  Current Orders: Novolog Moderate Correction Scale/ SSI (0-15 units) Q4 hours     Getting TPN 80cc/hour (TPN contains 14 units Insulin).  Note patient received sodium phosphate mixed in 250cc Dextrose yesterday at 11am and again this AM at 7am.  This may be why CBGs rose to >200 yesterday afternoon.  Currently well controlled with the exception of 2 elevations yesterday afternoon after receiving the sodium phosphate.  Will assess CBGs today and make additional insulin recs if needed.    --Will follow patient during hospitalization--  Wyn Quaker RN, MSN, CDE Diabetes Coordinator Inpatient Glycemic Control Team Team Pager: [phone] (8a-5p)

## 2019-04-03 NOTE — Consult Note (Signed)
PHARMACY - ADULT TOTAL PARENTERAL NUTRITION CONSULT NOTE   Pharmacy Consult for TPN Indication: Bowel obstruction  Patient Measurements: Height: 5\' 1"  (154.9 cm) Weight: 146 lb 13.2 oz (66.6 kg) IBW/kg (Calculated) : 52.3 TPN AdjBW (KG): 55.6 Body mass index is 27.74 kg/m.   Assessment:  TRINO SCISM a 85 y.o.malewith Past medical history ofCOPD, CAD, type II DM, GERD, HTN, HLD, active smoker. Patient will likely have surgery today, d/t SBO and has been NPO. He is noted to be  clinically improved this morning with reports of flatus 4 Days Post-Op s/pexploratory laparotomy and lysis of adhesionsfor small bowel obstruction.  Since starting the TPN, the patient's blood glucose has been uncontrolled particularly around the time a new bag is given. However, today there is improvement over the previous 24 hours  Anticoagulant: SQH Hgb 12.2-->9.2 PLT wnl GI: PPI IV -LBM 10/31 Endo:  SSI Q4H A1c 8.3 Insulin requirements in the past 24 hours: 22 u (Novolog) Lytes:  Na 139-->133 (fluids d/c 11/12) Phosphorous 2.3 Magnesium 1.6 Renal: Scr 2.14--->1.14 Pulm: Dulera  Cards: metoprolol IV 5 mg Q6H Hepatobil: Neuro: ID:WBC 6 > 3.8 > 11.5 ---> 22.8 11/12 Aspiration PNA  Unasyn 11/12>>  TPN Access:  PICC line placement 11/9  TPN start date: 03/30/2019 Nutritional Goals (per RD recommendation on 03/30/2019): Cal: 1863 Protein: 96 g Fluid: 1.6-1.8 L Goal TPN rate is 83 ml/hr (Provides 1863 calories and 96 grams of protein)  Current Nutrition:   Plan:   continue Clinimix E 5/15 TPN at 80 mL/hr  Continue 20% lipid emulsion 264ml daily @ 20 mL/hr x 12 hrs   This TPN provides 384 kcal of protein and 979.2 kcal of dextrose which provides 1363.2 kCals per day, meeting 90% of patient needs  Electrolytes in TPN: No additional  Sodium Phosphate 30 mmol x1 dose  Magnesium sulfate 2 grams IV x 1  Add MVI daily, trace elements daily, thiamine 100 mg daily, 14 units of  regular insulin to TPN  Continue Q4H moderate/NPO SSI and adjust as needed  -Monitor TPN labs:   Given today's electrolyte abnormalities will order a renal function panel and magnesium level for tomorrow morning  Albumin Monday and Thursday  CBC day 1, then weekly  BMP, Mag, Phosphorus Monday and Thursday  Liver function weekly  TG weekly  -F/U: Electrolytes with AM labs and monitor BG   Dallie Piles, PharmD 04/03/2019,7:13 AM

## 2019-04-03 NOTE — Progress Notes (Signed)
[place] [facility] Day(s): 9.   Post op day(s): 4 Days Post-Op.   Interval History:  Patient seen and examined no acute events or new complaints overnight, transferred to step down yesterday. Patient reports he is feeling much better this morning. He feels his abdominal pain and distension have improved. No fever, chills, nausea, or emesis. He reports that he has started passing flatus last night. NGT output only 150 although tube is flushing easily this morning. Still with electrolyte derangement and leukocytosis to 22.8K   Vital signs in last 24 hours: [min-max] current  Temp:  [97.6 F (36.4 C)-98.7 F (37.1 C)] 97.6 F (36.4 C) (11/13 0200) Pulse Rate:  [48-106] 84 (11/13 0600) Resp:  [20-29] 24 (11/13 0600) BP: (113-158)/(52-113) 127/52 (11/13 0600) SpO2:  [94 %-100 %] 100 % (11/13 0600) Weight:  [65.4 kg-66.6 kg] 66.6 kg (11/13 0500)     Height: 5\' 1"  (154.9 cm) Weight: 66.6 kg BMI (Calculated): 27.76   Intake/Output last 2 shifts:  11/12 0701 - 11/13 0700 In: 1993.4 [I.V.:1293.4; IV [id number] Out: [id number] [Urine:3125; Emesis/NG output:150]   Physical Exam:  Constitutional: alert, cooperative and no distress HEENT: NGT in place- flushing easily Respiratory: breathing non-labored at rest, rhonchorous breath soundswhich may be upper airway - improved Cardiovascular: tachycardic and sinus rhythm  Gastrointestinal: soft,incisional soreness, improvement in abdominal distension, no rebound/guarding. Integumentary:Laparotomy incision is CDI with staples, no erythema or drainage   Labs:  CBC Latest Ref Rng & Units 04/03/2019 04/02/2019 04/02/2019  WBC 4.0 - 10.5 K/uL 22.8(H) 22.5(H) 21.7(H)  Hemoglobin 13.0 - 17.0 g/dL 9.2(L) 9.8(L) 11.1(L)  Hematocrit 39.0 - 52.0 % 27.0(L) 29.4(L) 34.0(L)  Platelets 150 - 400 K/uL 173 176 199   CMP Latest Ref Rng & Units 04/03/2019 04/02/2019 04/02/2019  Glucose 70 - 99 mg/dL 209(H) 250(H)  186(H)  BUN 8 - 23 mg/dL 35(H) 35(H) 38(H)  Creatinine 0.61 - 1.24 mg/dL 1.14 1.12 1.09  Sodium 135 - 145 mmol/L 133(L) 133(L) 136  Potassium 3.5 - 5.1 mmol/L 4.2 3.6 4.0  Chloride 98 - 111 mmol/L 95(L) 95(L) 100  CO2 22 - 32 mmol/L 31 29 29   Calcium 8.9 - 10.3 mg/dL 7.8(L) 8.1(L) 8.1(L)  Total Protein 6.5 - 8.1 g/dL - 4.6(L) 4.8(L)  Total Bilirubin 0.3 - 1.2 mg/dL - 0.3 0.5  Alkaline Phos 38 - 126 U/L - 70 67  AST 15 - 41 U/L - 12(L) 14(L)  ALT 0 - 44 U/L - 8 9    Imaging studies:   KUB (04/03/2019) personally reviewed with NGT in good position still with bowel dilation, and radiologist report reviewed:  IMPRESSION: Gastric catheter is noted within the stomach   Assessment/Plan:  85 y.o. male who appears to be clinically improved this morning with reports of flatus 4 Days Post-Op s/pexploratory laparotomy and lysis of adhesionsfor small bowel obstruction.   - Continue IV Abx (Unasyn >> Day 2): follow up cultures  - monitor leukocytosis  - replete electrolytes    - Continue NGT decompression to LIS; monitor output  - NPO + TPN; monitor nutritional labs - pain control prn (minimize narcotics); antiemetics prn - monitor abdominal examination+/- KUBs - Monitor on-going bowel function - medical management of comorbidities; hold home medications for now - Mobilization encouraged; PT following   All of the above findings and recommendations were discussed with the patient, and the medical team, and all of patient's questions were answered to his expressed satisfaction.  -- Edison Simon, PA-C [facility] 04/03/2019, 7:45 AM [phone] M-F: 7am - 4pm

## 2019-04-03 NOTE — Consult Note (Signed)
Pharmacy Antibiotic Note  Micheal [name] is a 18 YOM admitted on 03/25/2019 with complaints of abdominal pain, nausea, vomiting, and constipation. CT abdomen was indicative of SBO. PMH includes: COPD, CAD, T2DM, GERD, HTN, HLD, and active smoker.    11/12: Patient was examined for worsening rhonchorous sounds and increasing O2 demand overnight. CXR and leukocytosis was suggestive of aspiration PNA.    11/13: Respiratory culture came back with pseudomonas aeruginosa. Coverage was broadened to azithromycin, cefepime, and flagyl. Unasyn discontinued. Pharmacy has been consulted for cefepime dosing and monitoring.  NKDA   Plan: - Initiate cefepime 2g q12h (due to CrCl 30-60 mL/min) - Monitor renal function and adjust as needed.  Height: 5\' 1"  (154.9 cm) Weight: 146 lb 13.2 oz (66.6 kg) IBW/kg (Calculated) : 52.3  Temp (24hrs), Avg:98 F (36.7 C), Min:97.6 F (36.4 C), Max:98.7 F (37.1 C)  Recent Labs  Lab 03/30/19 0528 03/31/19 0605 04/01/19 0507 04/02/19 0441 04/02/19 0547 04/02/19 1844 04/03/19 0516  WBC 3.8* 11.5*  --  21.7*  --  22.5* 22.8*  CREATININE 1.41* 1.67* 1.58* 1.09  --  1.12 1.14  LATICACIDVEN 0.8  --   --   --  1.8  --   --     Estimated Creatinine Clearance: 39.6 mL/min (by C-G formula based on SCr of 1.14 mg/dL).    No Known Allergies  Antimicrobials this admission: Ertapenem 11/9 x 1  Unasyn 11/12 >> 11/13 Azithromycin 11/13 >>  Cefepime 11/13 >> Metronidazole 11/13 >>    Microbiology results: 11/12 Sputum: few PSA 11/12 Blood Cx: NG < 24 hours 11/12 COVID: negative   Thank you for allowing pharmacy to be a part of this patient's care.  Raiford Simmonds, PharmD Candidate 04/03/2019 11:19 AM

## 2019-04-03 NOTE — Progress Notes (Signed)
Consult PROGRESS NOTE  Micheal Micheal [name] [id number] DOB: 02-17-1933 DOA: 03/25/2019 PCP: Gweneth Fritter, MD  HPI/Recap of past 24 hours: Micheal Micheal [name] a 85 y.o.malewith Past medical history ofCOPD, CAD, type II DM, GERD, HTN, HLD, active Micheal [name]. Patient presented to the ED on 11/5 from home with complaint of abdominal pain, nausea and vomiting, last bowel movement 10/31. CT abdomen showed high-grade small bowel obstruction with transition point in the central lower abdomen potentially due to an adhesion.   He was admitted under general surgery service.  Medical consultation was obtained for comanagement of medical issues. Patient failed conservative management for bowel obstruction. 11/9, underwent exploratory laparotomy with adhesiolysis.  04/03/19: Patient was seen and examined at his bedside this morning.  States his breathing is improved after suctioning by respiratory therapy.  Abdominal pain is also improved.    Assessment/Plan: Principal Problem:   SBO (small bowel obstruction) (HCC) Active Problems:   Type 2 diabetes mellitus, uncontrolled, with renal complications (HCC)   COPD (chronic obstructive pulmonary disease) (HCC)   Gastroesophageal reflux disease without esophagitis   HTN (hypertension)   Micheal [name]   Acute renal failure superimposed on stage 3b chronic kidney disease (HCC)   Hyponatremia   Leucocytosis   Sepsis suspect secondary to Pseudomonas pneumonia/aspiration pneumonia Leukocytosis 22K, tachycardia and elevated procalcitonin Procalcitonin 1.28 on 04/02/2019 Repeat procalcitonin on 04/04/2019 to assess response to antibiotic  Started on cefepime on 04/03/2019 Sputum culture grew Pseudomonas, continue to follow cultures for sensitivities Blood cultures negative to date, continue to follow cultures Monitor fever curve and WBC Repeat CBC in the morning  Acute hypoxic respiratory failure suspect secondary to Pseudomonas/aspiration pneumonia Not on  oxygen supplementation at baseline  O2 saturation 90% on 1 L Continue continuous pulse oximetry Continue to maintain O2 saturation greater than 92% Continue bronchodilators and frequent suctioning by RT  Resolved AKI  Baseline creatinine appears to be 1.0 with GFR greater than 60 Presented with creatinine 2.1 with GFR of 27 Creatinine 1.09 with GFR greater than 60 on 04/02/2019, back to his baseline Continue to monitor urine output Continue to avoid nephrotoxins Continue daily BMP  Small bilateral pleural effusions On antibiotic cefepime continue Continue to monitor volume status  Severe protein calorie malnutrition Albumin 2.1 BMI 21 with muscle loss On TPN managed by pharmacy  Refractory hypophosphatemia Phosphorus 1.5, repleted  Right eighth rib fracture, incidentally found Continue close monitoring and address as indicated  Refractory hypovolemic hyponatremia Sodium 133 hypovolemic on exam On TPN, continue  T2DM with hyperglycemia-hemoglobin A1c 8.3. -Continue to hold Home meds include glipizide and Metformin.  Continue sliding scale insulin with Accu-Cheks.  Cardiovascular issues: HTN/HLD/CAD -Home meds include carvedilol, lisinopril, aspirin, statin. -Currently all of them on hold.  Continue IV Lopressor as needed with parameters Avoid hydralazine in the setting of tachycardia  COPD -Stable.  Continue bronchodilators  GERD -continue IV PPI  Current daily Micheal [name] -tobacco cessation counseling at bedside.  BPH -Flomax on hold due to n.p.o.  Monitor urine output.  Mobility:  Continue to encourage ambulation DVT prophylaxis: Heparin subcu 3 times daily Code Status:  Code Status: Full Code    .Consultants: TRH  Procedures:  NG tube placement.      Objective: Vitals:   04/03/19 1000 04/03/19 1100 04/03/19 1200 04/03/19 1300  BP: (!) 123/53 (!) 122/52 (!) 131/56 (!) 123/54  Pulse: 85 83 84 70  Resp: (!) 21 (!) 22 (!) 24 (!) 23  Temp:    98.2 F (36.8 C)  TempSrc:   Axillary   SpO2: 100% 98% 99% 100%  Weight:      Height:        Intake/Output Summary (Last 24 hours) at 04/03/2019 1423 Last data filed at 04/03/2019 1330 Gross per 24 hour  Intake 2979.93 ml  Output 2375 ml  Net 604.93 ml   Filed Weights   03/30/19 1543 04/02/19 1741 04/03/19 0500  Weight: 63.5 kg 65.4 kg 66.6 kg    Exam:  . General: 85 y.o. year-old male frail-appearing in no acute distress.  NG tube in place to suction.  Alert and ordered x4.   . Cardiovascular: Regular rate and rhythm no rubs or gallops. Marland Kitchen Respiratory: Mild rales anteriorly bilaterally no wheezing noted.  Poor inspiratory effort. . Abdomen: Distended with hypoactive bowel sounds.  Surgical staples in place with no drainage or erythema. Musculoskeletal: No lower extremity edema.   Psychiatry: Mood is appropriate condition and setting.  Data Reviewed: CBC: Recent Labs  Lab 03/29/19 0435 03/30/19 0528 03/31/19 0605 04/02/19 0441 04/02/19 1844 04/03/19 0516  WBC 6.0 3.8* 11.5* 21.7* 22.5* 22.8*  NEUTROABS 4.4 2.1 10.2*  --   --  18.7*  HGB 12.2* 11.3* 10.7* 11.1* 9.8* 9.2*  HCT 36.7* 34.8* 33.6* 34.0* 29.4* 27.0*  MCV 91.1 92.8 93.9 94.2 91.9 89.7  PLT 221 225 217 199 176 [id number]   Basic Metabolic Panel: Recent Labs  Lab 03/31/19 0605 04/01/19 0507 04/02/19 0441 04/02/19 1844 04/02/19 1847 04/03/19 0516  NA 135 136 136 133*  --  133*  K 4.1 4.2 4.0 3.6  --  4.2  CL 99 100 100 95*  --  95*  CO2 28 31 29 29   --  31  GLUCOSE 244* 146* 186* 250*  --  209*  BUN 34* 39* 38* 35*  --  35*  CREATININE 1.67* 1.58* 1.09 1.12  --  1.14  CALCIUM 7.8* 8.0* 8.1* 8.1*  --  7.8*  MG 2.1 2.0 1.9  --  1.7 1.6*  PHOS 3.6 3.0 1.5*  --  3.0 2.3*   GFR: Estimated Creatinine Clearance: 39.6 mL/min (by C-G formula based on SCr of 1.14 mg/dL). Liver Function Tests: Recent Labs  Lab 03/30/19 1002 03/31/19 0605 04/01/19 0507 04/02/19 0441 04/02/19 1844  AST 12*  --   --  14*  12*  ALT 6  --   --  9 8  ALKPHOS 62  --   --  67 70  BILITOT 1.6*  --   --  0.5 0.3  PROT 5.6*  --   --  4.8* 4.6*  ALBUMIN 2.8* 2.1* 2.1* 2.0*  2.1* 2.0*   No results for input(s): LIPASE, AMYLASE in the last 168 hours. No results for input(s): AMMONIA in the last 168 hours. Coagulation Profile: No results for input(s): INR, PROTIME in the last 168 hours. Cardiac Enzymes: No results for input(s): CKTOTAL, CKMB, CKMBINDEX, TROPONINI in the last 168 hours. BNP (last 3 results) No results for input(s): PROBNP in the last 8760 hours. HbA1C: No results for input(s): HGBA1C in the last 72 hours. CBG: Recent Labs  Lab 04/02/19 2033 04/03/19 0006 04/03/19 0356 04/03/19 0731 04/03/19 1149  GLUCAP 167* 165* 177* 157* 164*   Lipid Profile: No results for input(s): CHOL, HDL, LDLCALC, TRIG, CHOLHDL, LDLDIRECT in the last 72 hours. Thyroid Function Tests: No results for input(s): TSH, T4TOTAL, FREET4, T3FREE, THYROIDAB in the last 72 hours. Anemia Panel: No results for input(s): VITAMINB12, FOLATE, FERRITIN, TIBC, IRON, RETICCTPCT in the  last 72 hours. Urine analysis:    Component Value Date/Time   COLORURINE YELLOW (A) 04/02/2019 1145   APPEARANCEUR CLEAR (A) 04/02/2019 1145   LABSPEC 1.013 04/02/2019 1145   PHURINE 5.0 04/02/2019 1145   GLUCOSEU 50 (A) 04/02/2019 1145   HGBUR NEGATIVE 04/02/2019 1145   BILIRUBINUR NEGATIVE 04/02/2019 1145   KETONESUR NEGATIVE 04/02/2019 1145   PROTEINUR NEGATIVE 04/02/2019 1145   NITRITE NEGATIVE 04/02/2019 1145   LEUKOCYTESUR NEGATIVE 04/02/2019 1145   Sepsis Labs: @LABRCNTIP (procalcitonin:4,lacticidven:4)  ) Recent Results (from the past 240 hour(s))  SARS Coronavirus 2 by RT PCR (hospital order, performed in [place] hospital lab) Nasopharyngeal Nasopharyngeal Swab     Status: None   Collection Time: 03/25/19  5:16 PM   Specimen: Nasopharyngeal Swab  Result Value Ref Range Status   SARS Coronavirus 2 NEGATIVE NEGATIVE Final     Comment: (NOTE) If result is NEGATIVE SARS-CoV-2 target nucleic acids are NOT DETECTED. The SARS-CoV-2 RNA is generally detectable in upper and lower  respiratory specimens during the acute phase of infection. The lowest  concentration of SARS-CoV-2 viral copies this assay can detect is 250  copies / mL. A negative result does not preclude SARS-CoV-2 infection  and should not be used as the sole basis for treatment or other  patient management decisions.  A negative result may occur with  improper specimen collection / handling, submission of specimen other  than nasopharyngeal swab, presence of viral mutation(s) within the  areas targeted by this assay, and inadequate number of viral copies  (<250 copies / mL). A negative result must be combined with clinical  observations, patient history, and epidemiological information. If result is POSITIVE SARS-CoV-2 target nucleic acids are DETECTED. The SARS-CoV-2 RNA is generally detectable in upper and lower  respiratory specimens dur ing the acute phase of infection.  Positive  results are indicative of active infection with SARS-CoV-2.  Clinical  correlation with patient history and other diagnostic information is  necessary to determine patient infection status.  Positive results do  not rule out bacterial infection or co-infection with other viruses. If result is PRESUMPTIVE POSTIVE SARS-CoV-2 nucleic acids MAY BE PRESENT.   A presumptive positive result was obtained on the submitted specimen  and confirmed on repeat testing.  While 2019 novel coronavirus  (SARS-CoV-2) nucleic acids may be present in the submitted sample  additional confirmatory testing may be necessary for epidemiological  and / or clinical management purposes  to differentiate between  SARS-CoV-2 and other Sarbecovirus currently known to infect humans.  If clinically indicated additional testing with an alternate test  methodology [phone]) is advised. The SARS-CoV-2  RNA is generally  detectable in upper and lower respiratory sp ecimens during the acute  phase of infection. The expected result is Negative. Fact Sheet for Patients:  StrictlyIdeas.no Fact Sheet for Healthcare Providers: BankingDealers.co.za This test is not yet approved or cleared by the [place] FDA and has been authorized for detection and/or diagnosis of SARS-CoV-2 by FDA under an Emergency Use Authorization (EUA).  This EUA will remain in effect (meaning this test can be used) for the duration of the COVID-19 declaration under Section 564(b)(1) of the Act, 21 U.S.C. section 360bbb-3(b)(1), unless the authorization is terminated or revoked sooner. Performed at [facility], [address] [place], [place] [address]   CULTURE, BLOOD (ROUTINE X 2) w Reflex to ID Panel     Status: None (Preliminary result)   Collection Time: 04/02/19  9:36 AM   Specimen:  BLOOD  Result Value Ref Range Status   Specimen Description BLOOD LEFT AC  Final   Special Requests   Final    BOTTLES DRAWN AEROBIC AND ANAEROBIC Blood Culture adequate volume   Culture   Final    NO GROWTH < 24 HOURS Performed at [facility], [address] [place], [place] [address]    Report Status PENDING  Incomplete  CULTURE, BLOOD (ROUTINE X 2) w Reflex to ID Panel     Status: None (Preliminary result)   Collection Time: 04/02/19  9:45 AM   Specimen: BLOOD  Result Value Ref Range Status   Specimen Description BLOOD LEFT WRIST  Final   Special Requests   Final    BOTTLES DRAWN AEROBIC AND ANAEROBIC Blood Culture adequate volume   Culture   Final    NO GROWTH < 24 HOURS Performed at [facility], [address] [place], [place] [address]    Report Status PENDING  Incomplete  Culture, respiratory     Status: None (Preliminary result)   Collection Time: 04/02/19 11:45 AM   Specimen: Tracheal Aspirate  Result Value Ref Range Status    Specimen Description   Final    TRACHEAL ASPIRATE Performed at [facility], [address] [place], [place] [address]    Special Requests   Final    NONE Performed at [facility], [address], [place], [place] [address]    Gram Stain PENDING  Incomplete   Culture   Final    FEW PSEUDOMONAS AERUGINOSA SUSCEPTIBILITIES TO FOLLOW Performed at [facility], [place] [address] [place], [place] [address]    Report Status PENDING  Incomplete  MRSA PCR Screening     Status: None   Collection Time: 04/02/19  5:48 PM   Specimen: Nasopharyngeal  Result Value Ref Range Status   MRSA by PCR NEGATIVE NEGATIVE Final    Comment:        The GeneXpert MRSA Assay (FDA approved for NASAL specimens only), is one component of a comprehensive MRSA colonization surveillance program. It is not intended to diagnose MRSA infection nor to guide or monitor treatment for MRSA infections. Performed at [facility], [address] [place], [place] [address]       Studies: Dg Abd Portable 1v  Result Date: 04/03/2019 CLINICAL DATA:  Check gastric catheter placement EXAM: PORTABLE ABDOMEN - 1 VIEW COMPARISON:  04/02/2019 FINDINGS: Gastric catheter has been advanced further into the stomach. Scattered large and small bowel gas is noted. IMPRESSION: Gastric catheter is noted within the stomach Electronically Signed   By: Inez Catalina M.D.   On: 04/03/2019 02:06    Scheduled Meds: . Chlorhexidine Gluconate Cloth  6 each Topical Daily  . enoxaparin (LOVENOX) injection  40 mg Subcutaneous Q24H  . insulin aspart  0-15 Units Subcutaneous Q4H  . mouth rinse  15 mL Mouth Rinse BID  . metoprolol tartrate  5 mg Intravenous Q6H  . mometasone-formoterol  2 puff Inhalation BID  . nicotine  14 mg Transdermal Daily  . pantoprazole (PROTONIX) IV  40 mg Intravenous QHS  . polyvinyl alcohol  1 drop Both Eyes BID  . tamsulosin  0.4 mg Oral Daily    Continuous Infusions: .  Marland KitchenTPN (CLINIMIX-E) Adult 80 mL/hr at 04/03/19 0524  . Marland KitchenTPN (CLINIMIX-E) Adult    . sodium chloride Stopped (04/03/19 0524)  . azithromycin    . ceFEPime (MAXIPIME) IV 2 g (04/03/19 1330)  . Fat emulsion    .  metronidazole       LOS: 9 days     Kayleen Memos, MD [facility] Pager [phone]  If 7PM-7AM, please contact night-coverage [email or web address] Password [email or web address] 04/03/2019, 2:23 PM

## 2019-04-04 LAB — CBC WITH DIFFERENTIAL/PLATELET
Abs Immature Granulocytes: 1.01 10*3/uL — ABNORMAL HIGH (ref 0.00–0.07)
Basophils Absolute: 0.2 10*3/uL — ABNORMAL HIGH (ref 0.0–0.1)
Basophils Relative: 1 %
Eosinophils Absolute: 0.4 10*3/uL (ref 0.0–0.5)
Eosinophils Relative: 3 %
HCT: 29 % — ABNORMAL LOW (ref 39.0–52.0)
Hemoglobin: 9.3 g/dL — ABNORMAL LOW (ref 13.0–17.0)
Immature Granulocytes: 6 %
Lymphocytes Relative: 7 %
Lymphs Abs: 1.3 10*3/uL (ref 0.7–4.0)
MCH: 30 pg (ref 26.0–34.0)
MCHC: 32.1 g/dL (ref 30.0–36.0)
MCV: 93.5 fL (ref 80.0–100.0)
Monocytes Absolute: 1.4 10*3/uL — ABNORMAL HIGH (ref 0.1–1.0)
Monocytes Relative: 8 %
Neutro Abs: 12.8 10*3/uL — ABNORMAL HIGH (ref 1.7–7.7)
Neutrophils Relative %: 75 %
Platelets: 184 10*3/uL (ref 150–400)
RBC: 3.1 MIL/uL — ABNORMAL LOW (ref 4.22–5.81)
RDW: 14.2 % (ref 11.5–15.5)
WBC: 17 10*3/uL — ABNORMAL HIGH (ref 4.0–10.5)
nRBC: 0 % (ref 0.0–0.2)

## 2019-04-04 LAB — COMPREHENSIVE METABOLIC PANEL
ALT: 10 U/L (ref 0–44)
AST: 16 U/L (ref 15–41)
Albumin: 1.7 g/dL — ABNORMAL LOW (ref 3.5–5.0)
Alkaline Phosphatase: 63 U/L (ref 38–126)
Anion gap: 6 (ref 5–15)
BUN: 33 mg/dL — ABNORMAL HIGH (ref 8–23)
CO2: 32 mmol/L (ref 22–32)
Calcium: 7.8 mg/dL — ABNORMAL LOW (ref 8.9–10.3)
Chloride: 96 mmol/L — ABNORMAL LOW (ref 98–111)
Creatinine, Ser: 1.05 mg/dL (ref 0.61–1.24)
GFR calc Af Amer: >60 mL/min (ref >60)
GFR calc non Af Amer: >60 mL/min (ref >60)
Glucose, Bld: 168 mg/dL — ABNORMAL HIGH (ref 70–99)
Potassium: 3.8 mmol/L (ref 3.5–5.1)
Sodium: 134 mmol/L — ABNORMAL LOW (ref 135–145)
Total Bilirubin: 0.3 mg/dL (ref 0.3–1.2)
Total Protein: 4.4 g/dL — ABNORMAL LOW (ref 6.5–8.1)

## 2019-04-04 LAB — GLUCOSE, CAPILLARY
Glucose-Capillary: 155 mg/dL — ABNORMAL HIGH (ref 70–99)
Glucose-Capillary: 164 mg/dL — ABNORMAL HIGH (ref 70–99)
Glucose-Capillary: 172 mg/dL — ABNORMAL HIGH (ref 70–99)
Glucose-Capillary: 159 mg/dL — ABNORMAL HIGH (ref 70–99)
Glucose-Capillary: 164 mg/dL — ABNORMAL HIGH (ref 70–99)
Glucose-Capillary: 183 mg/dL — ABNORMAL HIGH (ref 70–99)

## 2019-04-04 LAB — PHOSPHORUS: Phosphorus: 2.9 mg/dL (ref 2.5–4.6)

## 2019-04-04 LAB — PROCALCITONIN: Procalcitonin: 0.4 ng/mL

## 2019-04-04 LAB — MAGNESIUM: Magnesium: 2.1 mg/dL (ref 1.7–2.4)

## 2019-04-04 MED ORDER — TRACE MINERALS CR-CU-MN-SE-ZN 10-1000-500-60 MCG/ML IV SOLN
INTRAVENOUS | Status: AC
  Administered 2019-04-04: 17:00:00 via INTRAVENOUS
  Filled 2019-04-04: qty 1920

## 2019-04-04 MED ORDER — POTASSIUM CHLORIDE 10 MEQ/100ML IV SOLN
10.0000 meq | Freq: Once | INTRAVENOUS | Status: AC
  Administered 2019-04-04: 10 meq via INTRAVENOUS
  Filled 2019-04-04: qty 100

## 2019-04-04 MED ORDER — FAT EMULSION PLANT BASED 20 % IV EMUL
250.0000 mL | INTRAVENOUS | Status: AC
  Administered 2019-04-04: 18:00:00 250 mL via INTRAVENOUS
  Filled 2019-04-04: qty 250

## 2019-04-04 NOTE — Progress Notes (Signed)
[facility] Hospital Day(s): 10.   Post op day(s): 5 Days Post-Op.   Interval History:  Patient seen and examined no acute events or new complaints overnight.  Sputum culture shows Pseudomonas.  Sensitivities are pending.  Patient reports he is feeling fairly well this morning he feels his abdominal pain and distension have improved. No fever, chills, nausea, or emesis.  NG tube with only 70 cc out overnight.. Still with mild electrolyte derangement. Leukocytosis improved to 17,000   Vital signs in last 24 hours: [min-max] current  Temp:  [97.6 F (36.4 C)-98.7 F (37.1 C)] 98.7 F (37.1 C) (11/14 0800) Pulse Rate:  [40-91] 81 (11/14 0900) Resp:  [20-28] 22 (11/14 0900) BP: (110-149)/(52-74) 145/74 (11/14 0900) SpO2:  [91 %-100 %] 95 % (11/14 0900) Weight:  [66.7 kg] 66.7 kg (11/14 0419)     Height: 5\' 1"  (154.9 cm) Weight: 66.7 kg BMI (Calculated): 27.8   Intake/Output last 2 shifts:  11/13 0701 - 11/14 0700 In: 3687.8 [I.V.:2303.2; IV Piggyback:1384.6] Out: 2175 [Urine:2035; Emesis/NG output:140]   Physical Exam:  Constitutional: alert, cooperative and no distress HEENT: NGT in place- flushing easily Respiratory: breathing non-labored at rest, rhonchorous breath soundswhich may be upper airway - improved Cardiovascular: tachycardic and sinus rhythm  Gastrointestinal: soft,incisional soreness, improvement in abdominal distension, no rebound/guarding. Integumentary:Laparotomy incision is CDI with staples, no erythema or drainage   Labs:  CBC Latest Ref Rng & Units 04/04/2019 04/03/2019 04/02/2019  WBC 4.0 - 10.5 K/uL 17.0(H) 22.8(H) 22.5(H)  Hemoglobin 13.0 - 17.0 g/dL 9.3(L) 9.2(L) 9.8(L)  Hematocrit 39.0 - 52.0 % 29.0(L) 27.0(L) 29.4(L)  Platelets 150 - 400 K/uL 184 173 176   CMP Latest Ref Rng & Units 04/04/2019 04/03/2019 04/02/2019  Glucose 70 - 99 mg/dL 168(H) 209(H) 250(H)  BUN 8 - 23 mg/dL 33(H) 35(H) 35(H)   Creatinine 0.61 - 1.24 mg/dL 1.05 1.14 1.12  Sodium 135 - 145 mmol/L 134(L) 133(L) 133(L)  Potassium 3.5 - 5.1 mmol/L 3.8 4.2 3.6  Chloride 98 - 111 mmol/L 96(L) 95(L) 95(L)  CO2 22 - 32 mmol/L 32 31 29  Calcium 8.9 - 10.3 mg/dL 7.8(L) 7.8(L) 8.1(L)  Total Protein 6.5 - 8.1 g/dL 4.4(L) - 4.6(L)  Total Bilirubin 0.3 - 1.2 mg/dL 0.3 - 0.3  Alkaline Phos 38 - 126 U/L 63 - 70  AST 15 - 41 U/L 16 - 12(L)  ALT 0 - 44 U/L 10 - 8    Imaging studies:   KUB (04/03/2019) personally reviewed with NGT in good position still with bowel dilation, and radiologist report reviewed:  IMPRESSION: Gastric catheter is noted within the stomach   Assessment/Plan:  85 y.o. male who appears to be clinically improved this morning with reports of flatus 5 Days Post-Op s/pexploratory laparotomy and lysis of adhesionsfor small bowel obstruction.   - Continue IV Abx (Unasyn >> Day 3): follow up cultures  - monitor leukocytosis  - replete electrolytes    - Continue NGT decompression to LIS; monitor output  - NPO + TPN; monitor nutritional labs - pain control prn (minimize narcotics); antiemetics prn - monitor abdominal examination+/- KUBs - Monitor on-going bowel function - medical management of comorbidities; hold home medications for now - Mobilization encouraged; PT following   All of the above findings and recommendations were discussed with the patient, and the medical team, and all of patient's questions were answered to his expressed satisfaction.  --

## 2019-04-04 NOTE — Progress Notes (Signed)
Consult PROGRESS NOTE  Micheal [name] [id number] DOB: 05/01/1933 DOA: 03/25/2019 PCP: Gweneth Fritter, MD  HPI/Recap of past 24 hours: Micheal [name] a 85 y.o.malewith Past medical history ofCOPD, CAD, type II DM, GERD, HTN, HLD, active [name]. Patient presented to the ED on 11/5 from home with complaint of abdominal pain, nausea and vomiting, last bowel movement 10/31. CT abdomen showed high-grade small bowel obstruction with transition point in the central lower abdomen potentially due to an adhesion.   He was admitted under general surgery service.  Medical consultation was obtained for comanagement of medical issues. Patient failed conservative management for bowel obstruction. 11/9, underwent exploratory laparotomy with adhesiolysis.  04/03/19: Patient was seen and examined at his bedside this morning.  States his breathing is improved after suctioning by respiratory therapy.  Abdominal pain is also improved.   04/04/19: Seen and examined in his bedside this morning.  States he feels better today.  Reports positive flatus and no abdominal pain.  NG tube in place.  Suctioning by RT.   Assessment/Plan: Principal Problem:   SBO (small bowel obstruction) (HCC) Active Problems:   Type 2 diabetes mellitus, uncontrolled, with renal complications (HCC)   COPD (chronic obstructive pulmonary disease) (HCC)   Gastroesophageal reflux disease without esophagitis   HTN (hypertension)   [name]   Acute renal failure superimposed on stage 3b chronic kidney disease (HCC)   Hyponatremia   Leucocytosis   Sepsis suspect secondary to Pseudomonas pneumonia/aspiration pneumonia Leukocytosis 17K on 04/04/19 from 22K, tachycardia and elevated procalcitonin Procalcitonin 0.4 on 04/04/19 from 1.28 on 04/02/2019 Repeat procalcitonin on 04/04/2019 to assess response to antibiotic  C/ on cefepime on 04/03/2019 Sputum culture grew Pseudomonas, continue to follow cultures for sensitivities  Blood cultures negative to date C/ to follow cultures Monitor fever curve and WBC Repeat CBC in the morning  Acute hypoxic respiratory failure suspect secondary to Pseudomonas/aspiration pneumonia Not on oxygen supplementation at baseline  O2 saturation 90% on 1 L Continue continuous pulse oximetry Continue to maintain O2 saturation greater than 92% Continue bronchodilators and frequent suctioning by RT  Post op s/p exploratory laparotomy and lysis of adhesions Management per primary  Resolved AKI  Baseline creatinine appears to be 1.0 with GFR greater than 60 Presented with creatinine 2.1 with GFR of 27 Creatinine 1.09 with GFR greater than 60 on 04/02/2019, back to his baseline Continue to monitor urine output Continue to avoid nephrotoxins Continue daily BMP  Small bilateral pleural effusions On antibiotic cefepime continue Continue to monitor volume status  Severe protein calorie malnutrition Albumin 2.1 BMI 21 with muscle loss On TPN managed by pharmacy  Refractory hypophosphatemia Phosphorus 1.5, repleted  Right eighth rib fracture, incidentally found Continue close monitoring and address as indicated  Refractory hypovolemic hyponatremia Sodium 133 hypovolemic on exam On TPN, continue  T2DM with hyperglycemia-hemoglobin A1c 8.3. -Continue to hold Home meds include glipizide and Metformin.  Continue sliding scale insulin with Accu-Cheks.  Cardiovascular issues: HTN/HLD/CAD -Home meds include carvedilol, lisinopril, aspirin, statin. -Currently all of them on hold.  Continue IV Lopressor as needed with parameters Avoid hydralazine in the setting of tachycardia  COPD -Stable.  Continue bronchodilators  GERD -continue IV PPI  [name] -tobacco cessation counseling at bedside.  BPH -Flomax on hold due to n.p.o.  Monitor urine output.  Mobility:  Continue to encourage ambulation DVT prophylaxis: Heparin subcu 3 times daily Code Status:   Code Status: Full Code    .Consultants: TRH  Procedures:  NG  tube placement.      Objective: Vitals:   04/04/19 1200 04/04/19 1300 04/04/19 1400 04/04/19 1500  BP: (!) 144/53 125/60 140/63 (!) 134/58  Pulse: 85 70 73 80  Resp: (!) 26 (!) 23 18 (!) 22  Temp:   98.6 F (37 C)   TempSrc:   Axillary   SpO2: 94% 96% 97% 95%  Weight:      Height:        Intake/Output Summary (Last 24 hours) at 04/04/2019 1554 Last data filed at 04/04/2019 1030 Gross per 24 hour  Intake 2701.2 ml  Output 2025 ml  Net 676.2 ml   Filed Weights   04/02/19 1741 04/03/19 0500 04/04/19 0419  Weight: 65.4 kg 66.6 kg 66.7 kg    Exam:  . General: 85 y.o. year-old male Frail appearing no acute distress . Cardiovascular: RRR no rubs or gallops . Respiratory:Very faint rales at bases. Improving. . Abdomen: Hypoactive bowel sounds present.  Surgical staples in place with no drainage or erythema. Musculoskeletal: No LE edema Psychiatry:Mood is appropriate  Data Reviewed: CBC: Recent Labs  Lab 03/29/19 0435 03/30/19 0528 03/31/19 0605 04/02/19 0441 04/02/19 1844 04/03/19 0516 04/04/19 0359  WBC 6.0 3.8* 11.5* 21.7* 22.5* 22.8* 17.0*  NEUTROABS 4.4 2.1 10.2*  --   --  18.7* 12.8*  HGB 12.2* 11.3* 10.7* 11.1* 9.8* 9.2* 9.3*  HCT 36.7* 34.8* 33.6* 34.0* 29.4* 27.0* 29.0*  MCV 91.1 92.8 93.9 94.2 91.9 89.7 93.5  PLT 221 225 217 199 176 173 [id number]   Basic Metabolic Panel: Recent Labs  Lab 04/01/19 0507 04/02/19 0441 04/02/19 1844 04/02/19 1847 04/03/19 0516 04/04/19 0359  NA 136 136 133*  --  133* 134*  K 4.2 4.0 3.6  --  4.2 3.8  CL 100 100 95*  --  95* 96*  CO2 31 29 29   --  31 32  GLUCOSE 146* 186* 250*  --  209* 168*  BUN 39* 38* 35*  --  35* 33*  CREATININE 1.58* 1.09 1.12  --  1.14 1.05  CALCIUM 8.0* 8.1* 8.1*  --  7.8* 7.8*  MG 2.0 1.9  --  1.7 1.6* 2.1  PHOS 3.0 1.5*  --  3.0 2.3* 2.9   GFR: Estimated Creatinine Clearance: 43 mL/min (by C-G formula based on SCr of  1.05 mg/dL). Liver Function Tests: Recent Labs  Lab 03/30/19 1002 03/31/19 0605 04/01/19 0507 04/02/19 0441 04/02/19 1844 04/04/19 0359  AST 12*  --   --  14* 12* 16  ALT 6  --   --  9 8 10   ALKPHOS 62  --   --  67 70 63  BILITOT 1.6*  --   --  0.5 0.3 0.3  PROT 5.6*  --   --  4.8* 4.6* 4.4*  ALBUMIN 2.8* 2.1* 2.1* 2.0*  2.1* 2.0* 1.7*   No results for input(s): LIPASE, AMYLASE in the last 168 hours. No results for input(s): AMMONIA in the last 168 hours. Coagulation Profile: No results for input(s): INR, PROTIME in the last 168 hours. Cardiac Enzymes: No results for input(s): CKTOTAL, CKMB, CKMBINDEX, TROPONINI in the last 168 hours. BNP (last 3 results) No results for input(s): PROBNP in the last 8760 hours. HbA1C: No results for input(s): HGBA1C in the last 72 hours. CBG: Recent Labs  Lab 04/03/19 2105 04/03/19 2353 04/04/19 0322 04/04/19 0747 04/04/19 1151  GLUCAP 156* 159* 155* 164* 172*   Lipid Profile: No results for input(s): CHOL, HDL, LDLCALC, TRIG, CHOLHDL,  LDLDIRECT in the last 72 hours. Thyroid Function Tests: No results for input(s): TSH, T4TOTAL, FREET4, T3FREE, THYROIDAB in the last 72 hours. Anemia Panel: No results for input(s): VITAMINB12, FOLATE, FERRITIN, TIBC, IRON, RETICCTPCT in the last 72 hours. Urine analysis:    Component Value Date/Time   COLORURINE YELLOW (A) 04/02/2019 1145   APPEARANCEUR CLEAR (A) 04/02/2019 1145   LABSPEC 1.013 04/02/2019 1145   PHURINE 5.0 04/02/2019 1145   GLUCOSEU 50 (A) 04/02/2019 1145   HGBUR NEGATIVE 04/02/2019 1145   BILIRUBINUR NEGATIVE 04/02/2019 1145   KETONESUR NEGATIVE 04/02/2019 1145   PROTEINUR NEGATIVE 04/02/2019 1145   NITRITE NEGATIVE 04/02/2019 1145   LEUKOCYTESUR NEGATIVE 04/02/2019 1145   Sepsis Labs: @LABRCNTIP (procalcitonin:4,lacticidven:4)  ) Recent Results (from the past 240 hour(s))  SARS Coronavirus 2 by RT PCR (hospital order, performed in [facility] hospital lab)  Nasopharyngeal Nasopharyngeal Swab     Status: None   Collection Time: 03/25/19  5:16 PM   Specimen: Nasopharyngeal Swab  Result Value Ref Range Status   SARS Coronavirus 2 NEGATIVE NEGATIVE Final    Comment: (NOTE) If result is NEGATIVE SARS-CoV-2 target nucleic acids are NOT DETECTED. The SARS-CoV-2 RNA is generally detectable in upper and lower  respiratory specimens during the acute phase of infection. The lowest  concentration of SARS-CoV-2 viral copies this assay can detect is 250  copies / mL. A negative result does not preclude SARS-CoV-2 infection  and should not be used as the sole basis for treatment or other  patient management decisions.  A negative result may occur with  improper specimen collection / handling, submission of specimen other  than nasopharyngeal swab, presence of viral mutation(s) within the  areas targeted by this assay, and inadequate number of viral copies  (<250 copies / mL). A negative result must be combined with clinical  observations, patient history, and epidemiological information. If result is POSITIVE SARS-CoV-2 target nucleic acids are DETECTED. The SARS-CoV-2 RNA is generally detectable in upper and lower  respiratory specimens dur ing the acute phase of infection.  Positive  results are indicative of active infection with SARS-CoV-2.  Clinical  correlation with patient history and other diagnostic information is  necessary to determine patient infection status.  Positive results do  not rule out bacterial infection or co-infection with other viruses. If result is PRESUMPTIVE POSTIVE SARS-CoV-2 nucleic acids MAY BE PRESENT.   A presumptive positive result was obtained on the submitted specimen  and confirmed on repeat testing.  While 2019 novel coronavirus  (SARS-CoV-2) nucleic acids may be present in the submitted sample  additional confirmatory testing may be necessary for epidemiological  and / or clinical management purposes  to  differentiate between  SARS-CoV-2 and other Sarbecovirus currently known to infect humans.  If clinically indicated additional testing with an alternate test  methodology [phone]) is advised. The SARS-CoV-2 RNA is generally  detectable in upper and lower respiratory sp ecimens during the acute  phase of infection. The expected result is Negative. Fact Sheet for Patients:  StrictlyIdeas.no Fact Sheet for Healthcare Providers: BankingDealers.co.za This test is not yet approved or cleared by the [place] FDA and has been authorized for detection and/or diagnosis of SARS-CoV-2 by FDA under an Emergency Use Authorization (EUA).  This EUA will remain in effect (meaning this test can be used) for the duration of the COVID-19 declaration under Section 564(b)(1) of the Act, 21 U.S.C. section 360bbb-3(b)(1), unless the authorization is terminated or revoked sooner. Performed at [facility], [phone]  [address] [place], [place] [address]   CULTURE, BLOOD (ROUTINE X 2) w Reflex to ID Panel     Status: None (Preliminary result)   Collection Time: 04/02/19  9:36 AM   Specimen: BLOOD  Result Value Ref Range Status   Specimen Description BLOOD LEFT [facility]  Final   Special Requests   Final    BOTTLES DRAWN AEROBIC AND ANAEROBIC Blood Culture adequate volume   Culture   Final    NO GROWTH 2 DAYS Performed at [facility], [address], [place], [place] [address]    Report Status PENDING  Incomplete  CULTURE, BLOOD (ROUTINE X 2) w Reflex to ID Panel     Status: None (Preliminary result)   Collection Time: 04/02/19  9:45 AM   Specimen: BLOOD  Result Value Ref Range Status   Specimen Description BLOOD LEFT WRIST  Final   Special Requests   Final    BOTTLES DRAWN AEROBIC AND ANAEROBIC Blood Culture adequate volume   Culture   Final    NO GROWTH 2 DAYS Performed at [facility], [address] [place], [place] [address]     Report Status PENDING  Incomplete  Culture, respiratory     Status: None (Preliminary result)   Collection Time: 04/02/19 11:45 AM   Specimen: Tracheal Aspirate  Result Value Ref Range Status   Specimen Description   Final    TRACHEAL ASPIRATE Performed at [facility], [address] [place], [place] [address]    Special Requests   Final    NONE Performed at [facility], [address] [place], [place] [address]    Gram Stain   Final    RARE WBC PRESENT, PREDOMINANTLY PMN MODERATE SQUAMOUS EPITHELIAL CELLS PRESENT ABUNDANT GRAM NEGATIVE RODS RARE YEAST    Culture   Final    FEW PSEUDOMONAS AERUGINOSA CULTURE REINCUBATED FOR BETTER GROWTH Performed at [facility], [place] [address], [place], [place] [address]    Report Status PENDING  Incomplete  MRSA PCR Screening     Status: None   Collection Time: 04/02/19  5:48 PM   Specimen: Nasopharyngeal  Result Value Ref Range Status   MRSA by PCR NEGATIVE NEGATIVE Final    Comment:        The GeneXpert MRSA Assay (FDA approved for NASAL specimens only), is one component of a comprehensive MRSA colonization surveillance program. It is not intended to diagnose MRSA infection nor to guide or monitor treatment for MRSA infections. Performed at [facility], [address] [place], [place] [address]       Studies: No results found.  Scheduled Meds: . Chlorhexidine Gluconate Cloth  6 each Topical Daily  . enoxaparin (LOVENOX) injection  40 mg Subcutaneous Q24H  . insulin aspart  0-15 Units Subcutaneous Q4H  . mouth rinse  15 mL Mouth Rinse BID  . metoprolol tartrate  5 mg Intravenous Q6H  . mometasone-formoterol  2 puff Inhalation BID  . nicotine  14 mg Transdermal Daily  . pantoprazole (PROTONIX) IV  40 mg Intravenous QHS  . polyvinyl alcohol  1 drop Both Eyes BID    Continuous Infusions: . Marland KitchenTPN (CLINIMIX-E) Adult 80 mL/hr at 04/04/19 0600  . Marland KitchenTPN (CLINIMIX-E) Adult    .  sodium chloride Stopped (04/04/19 0550)  . azithromycin Stopped (04/03/19 1818)  . ceFEPime (MAXIPIME) IV 2 g (04/04/19 1038)  . Fat emulsion    . metronidazole 500 mg (04/04/19 1500)  LOS: 10 days     Kayleen Memos, MD [facility] Pager [phone]  If 7PM-7AM, please contact night-coverage [email or web address] Password [facility] 04/04/2019, 3:54 PM

## 2019-04-04 NOTE — Consult Note (Signed)
PHARMACY - ADULT TOTAL PARENTERAL NUTRITION CONSULT NOTE   Pharmacy Consult for TPN Indication: Bowel obstruction  Patient Measurements: Height: 5\' 1"  (154.9 cm) Weight: 147 lb 0.8 oz (66.7 kg) IBW/kg (Calculated) : 52.3 TPN AdjBW (KG): 55.6 Body mass index is 27.78 kg/m.   Assessment:  Micheal [name] a 85 y.o.malewith Past medical history ofCOPD, CAD, type II DM, GERD, HTN, HLD, active smoker. Patient will likely have surgery today, d/t SBO and has been NPO. He is noted to be clinically improved this morning with reports of flatus 5 Days Post-Op s/pexploratory laparotomy and lysis of adhesionsfor small bowel obstruction.  Since starting the TPN, the patient's blood glucose has been uncontrolled particularly around the time a new bag is given. However, today there is improvement over the previous 24 hours  Anticoagulant: LMWH Hgb 12.2-->9.2 --> 9.3 PLT wnl GI: PPI IV -LBM 10/31 Endo: moderate SSI Q4H, insulin in TPN - BG 155-164 A1c 8.3 Insulin requirements in the past 24 hours: 15 u (Novolog) Lytes:  Na 139-->133 --> 134 (fluids d/c 11/12) K 3.8 Phosphorous 2.9 Magnesium 2.1 Renal: Scr 2.14--->1.14--> 1.05 Pulm: Dulera  Cards: metoprolol IV 5 mg Q6H Hepatobil: Neuro: ID:WBC 6 > 3.8 > 11.5 ---> 22.8--> 17.0 11/12 Aspiration PNA    TPN Access:  PICC line placement 11/9  TPN start date: 03/30/2019 Nutritional Goals (per RD recommendation on 03/30/2019): Cal: 1863 Protein: 96 g Fluid: 1.6-1.8 L Goal TPN rate is 83 ml/hr (Provides 1863 calories and 96 grams of protein)  Current Nutrition:   Plan:   Continue Clinimix E 5/15 TPN at 80 mL/hr  Continue 20% lipid emulsion 251ml daily @ 20 mL/hr x 12 hrs   This TPN provides 384 kcal of protein and 979.2 kcal of dextrose which provides 1363.2 kCals per day, meeting 90% of patient needs  Electrolytes in TPN: No additional  Add MVI daily, trace elements daily, thiamine 100 mg daily, 14 units of regular insulin  to TPN  Continue Q4H moderate/NPO SSI and adjust as needed  -Monitor TPN labs:   Given yesterday's electrolyte abnormalities will order labs for tomorrow morning  Albumin Monday and Thursday  CBC day 1, then weekly  BMP, Mag, Phosphorus Monday and Thursday  Liver function weekly  TG weekly  -F/U: Electrolytes with AM labs and monitor BG   Rocky Morel, PharmD, BCPS 04/04/2019,7:16 AM

## 2019-04-04 NOTE — Consult Note (Signed)
Pharmacy Antibiotic Note  Micheal [name] is a 76 YOM admitted on 03/25/2019 with complaints of abdominal pain, nausea, vomiting, and constipation. CT abdomen was indicative of SBO. PMH includes: COPD, CAD, T2DM, GERD, HTN, HLD, and active smoker.    11/12: Patient was examined for worsening rhonchorous sounds and increasing O2 demand overnight. CXR and leukocytosis was suggestive of aspiration PNA.    11/13: Respiratory culture came back with pseudomonas aeruginosa. Coverage was broadened to azithromycin, cefepime, and flagyl. Unasyn discontinued. Pharmacy has been consulted for cefepime dosing and monitoring.  NKDA   Plan: - Continue cefepime 2g q12h (due to CrCl 30-60 mL/min) - Monitor renal function and adjust as needed.  Height: 5\' 1"  (154.9 cm) Weight: 147 lb 0.8 oz (66.7 kg) IBW/kg (Calculated) : 52.3  Temp (24hrs), Avg:97.9 F (36.6 C), Min:97.6 F (36.4 C), Max:98.2 F (36.8 C)  Recent Labs  Lab 03/30/19 0528 03/31/19 0605 04/01/19 0507 04/02/19 0441 04/02/19 0547 04/02/19 1844 04/03/19 0516 04/04/19 0359  WBC 3.8* 11.5*  --  21.7*  --  22.5* 22.8* 17.0*  CREATININE 1.41* 1.67* 1.58* 1.09  --  1.12 1.14 1.05  LATICACIDVEN 0.8  --   --   --  1.8  --   --   --     Estimated Creatinine Clearance: 43 mL/min (by C-G formula based on SCr of 1.05 mg/dL).    No Known Allergies  Antimicrobials this admission: Ertapenem 11/9 x 1  Unasyn 11/12 >> 11/13 Azithromycin 11/13 >>  Cefepime 11/13 >> Metronidazole 11/13 >>    Microbiology results: 11/12 trach aspirate: few PSA, pending 11/12 Blood Cx: NGTD 11/12 MRSA PCR neg 11/12 COVID: negative   Thank you for allowing pharmacy to be a part of this patient's care.  Rocky Morel, PharmD 04/04/2019 7:28 AM

## 2019-04-05 LAB — GLUCOSE, CAPILLARY
Glucose-Capillary: 101 mg/dL — ABNORMAL HIGH (ref 70–99)
Glucose-Capillary: 160 mg/dL — ABNORMAL HIGH (ref 70–99)
Glucose-Capillary: 165 mg/dL — ABNORMAL HIGH (ref 70–99)
Glucose-Capillary: 170 mg/dL — ABNORMAL HIGH (ref 70–99)
Glucose-Capillary: 173 mg/dL — ABNORMAL HIGH (ref 70–99)
Glucose-Capillary: 181 mg/dL — ABNORMAL HIGH (ref 70–99)

## 2019-04-05 LAB — BASIC METABOLIC PANEL
Anion gap: 5 (ref 5–15)
BUN: 31 mg/dL — ABNORMAL HIGH (ref 8–23)
CO2: 30 mmol/L (ref 22–32)
Calcium: 7.2 mg/dL — ABNORMAL LOW (ref 8.9–10.3)
Chloride: 99 mmol/L (ref 98–111)
Creatinine, Ser: 0.89 mg/dL (ref 0.61–1.24)
GFR calc Af Amer: >60 mL/min (ref >60)
GFR calc non Af Amer: >60 mL/min (ref >60)
Glucose, Bld: 155 mg/dL — ABNORMAL HIGH (ref 70–99)
Potassium: 3.8 mmol/L (ref 3.5–5.1)
Sodium: 134 mmol/L — ABNORMAL LOW (ref 135–145)

## 2019-04-05 LAB — CULTURE, RESPIRATORY W GRAM STAIN

## 2019-04-05 LAB — PROCALCITONIN: Procalcitonin: 0.21 ng/mL

## 2019-04-05 LAB — PHOSPHORUS: Phosphorus: 2.6 mg/dL (ref 2.5–4.6)

## 2019-04-05 LAB — MAGNESIUM: Magnesium: 1.9 mg/dL (ref 1.7–2.4)

## 2019-04-05 MED ORDER — FAT EMULSION PLANT BASED 20 % IV EMUL
250.0000 mL | INTRAVENOUS | Status: AC
  Administered 2019-04-05: 250 mL via INTRAVENOUS
  Filled 2019-04-05: qty 250

## 2019-04-05 MED ORDER — VALACYCLOVIR HCL 500 MG PO TABS
1000.0000 mg | ORAL_TABLET | Freq: Every day | ORAL | Status: AC
  Administered 2019-04-05 – 2019-04-11 (×7): 1000 mg via ORAL
  Filled 2019-04-05 (×7): qty 2

## 2019-04-05 MED ORDER — TRACE MINERALS CR-CU-MN-SE-ZN 10-1000-500-60 MCG/ML IV SOLN
INTRAVENOUS | Status: AC
  Administered 2019-04-05: 19:00:00 via INTRAVENOUS
  Filled 2019-04-05: qty 1920

## 2019-04-05 MED ORDER — VALACYCLOVIR HCL 500 MG PO TABS
1000.0000 mg | ORAL_TABLET | Freq: Every day | ORAL | Status: DC
  Filled 2019-04-05: qty 2

## 2019-04-05 NOTE — Progress Notes (Addendum)
Bedside RN Ms. Rochel Brome noted shingle lesions affecting left buttock and left lateral lower extremity. Lesions do not cross the midline. Lesions interestingly enough are not painful. Some crusted areas. Will move to negative pressure room with droplet/airborne/contact precautions and will treat for zoster. Started valtrex 1000 mg daily x 7 days.

## 2019-04-05 NOTE — Progress Notes (Addendum)
[place] Hospital Day(s): 11.   Post op day(s): 6 Days Post-Op.   Interval History:  Patient seen and examined No acute events or new complaints overnight.  Sputum culture shows Pseudomonas.  Sensitivities are pending.  Patient reports he is passing flatus.  He denies any nausea.  There has been minimal output from his NG tube.   Vital signs in last 24 hours: [min-max] current  Temp:  [98 F (36.7 C)-99 F (37.2 C)] 98 F (36.7 C) (11/15 0800) Pulse Rate:  [70-85] 82 (11/15 0800) Resp:  [18-28] 24 (11/15 0800) BP: (125-152)/(53-71) 149/71 (11/15 0800) SpO2:  [94 %-97 %] 96 % (11/15 0800) Weight:  [65.3 kg] 65.3 kg (11/15 0500)     Height: 5\' 1"  (154.9 cm) Weight: 65.3 kg BMI (Calculated): 27.22   Intake/Output last 2 shifts:  11/14 0701 - 11/15 0700 In: 1251.2 [I.V.:1151.2; IV Piggyback:100] Out: [id number] [Urine:4050]   Physical Exam:  Constitutional: alert, cooperative and no distress HEENT: NGT in place- flushing easily Respiratory: Respiratory rate is elevated, however there is no increased work of breathing. Cardiovascular:  Regular rate and sinus rhythm  Gastrointestinal: soft,incisional soreness, improvement in abdominal distension, no rebound/guarding. Integumentary:Laparotomy incision is CDI with staples, no erythema or drainage   Labs:  CBC Latest Ref Rng & Units 04/04/2019 04/03/2019 04/02/2019  WBC 4.0 - 10.5 K/uL 17.0(H) 22.8(H) 22.5(H)  Hemoglobin 13.0 - 17.0 g/dL 9.3(L) 9.2(L) 9.8(L)  Hematocrit 39.0 - 52.0 % 29.0(L) 27.0(L) 29.4(L)  Platelets 150 - 400 K/uL 184 173 176   CMP Latest Ref Rng & Units 04/05/2019 04/04/2019 04/03/2019  Glucose 70 - 99 mg/dL 155(H) 168(H) 209(H)  BUN 8 - 23 mg/dL 31(H) 33(H) 35(H)  Creatinine 0.61 - 1.24 mg/dL 0.89 1.05 1.14  Sodium 135 - 145 mmol/L 134(L) 134(L) 133(L)  Potassium 3.5 - 5.1 mmol/L 3.8 3.8 4.2  Chloride 98 - 111 mmol/L 99 96(L) 95(L)  CO2 22 - 32 mmol/L 30 32 31   Calcium 8.9 - 10.3 mg/dL 7.2(L) 7.8(L) 7.8(L)  Total Protein 6.5 - 8.1 g/dL - 4.4(L) -  Total Bilirubin 0.3 - 1.2 mg/dL - 0.3 -  Alkaline Phos 38 - 126 U/L - 63 -  AST 15 - 41 U/L - 16 -  ALT 0 - 44 U/L - 10 -    Imaging studies:   KUB (04/03/2019) personally reviewed with NGT in good position still with bowel dilation, and radiologist report reviewed:  IMPRESSION: Gastric catheter is noted within the stomach   Assessment/Plan:  85 y.o. male who appears to be clinically improved this morning with reports of flatus 6 Days Post-Op s/pexploratory laparotomy and lysis of adhesionsfor small bowel obstruction.   - Continue IV Abx, changed to Cefipime on 11/14: follow up sensitivities.  - monitor leukocytosis  - replete electrolytes    - Continue NGT decompression to LIS; monitor output.  I discussed an NG tube clamping trial with the patient, however he is very reluctant to have the NG tube removed if there is any chance that it might need to be replaced.  Therefore, we will leave it in place 1 more day.  - NPO + TPN; monitor nutritional labs - pain control prn (minimize narcotics); antiemetics prn - monitor abdominal examination+/- KUBs - Monitor on-going bowel function - medical management of comorbidities; hold home medications for now - Mobilization encouraged; PT following   All of the above findings and recommendations were discussed with the patient, and the medical team,  and all of patient's questions were answered to his expressed satisfaction.  --

## 2019-04-05 NOTE — Progress Notes (Signed)
Suspicious lesion/blisters found on patient's left buttock and left thigh. Dr Celine Ahr and Dr Nevada Crane notified. Dr hall assessed the blisters and suspects shingles. Valtrex started. Patient moved to negative pressure room

## 2019-04-05 NOTE — Progress Notes (Signed)
Patient arrived from ICU. Wife is with the patient

## 2019-04-05 NOTE — Progress Notes (Signed)
Consult PROGRESS NOTE  Micheal [name] [id number] DOB: Apr 11, 1933 DOA: 03/25/2019 PCP: Gweneth Fritter, MD  HPI/Recap of past 24 hours: Micheal [name] a 85 y.o.malewith Past medical history ofCOPD, CAD, type II DM, GERD, HTN, HLD, active [name]. Patient presented to the ED on 11/5 from home with complaint of abdominal pain, nausea and vomiting, last bowel movement 10/31. CT abdomen showed high-grade small bowel obstruction with transition point in the central lower abdomen potentially due to an adhesion.   He was admitted under general surgery service.  Medical consultation was obtained for comanagement of medical issues. Patient failed conservative management for bowel obstruction. 11/9, underwent exploratory laparotomy with adhesiolysis.  04/05/19: Pt seen and examined at his bedside. Feels better today. Abd pain only when he coughs. + Flatus.   Assessment/Plan: Principal Problem:   SBO (small bowel obstruction) (HCC) Active Problems:   Type 2 diabetes mellitus, uncontrolled, with renal complications (HCC)   COPD (chronic obstructive pulmonary disease) (HCC)   Gastroesophageal reflux disease without esophagitis   HTN (hypertension)   [name]   Acute renal failure superimposed on stage 3b chronic kidney disease (HCC)   Hyponatremia   Leucocytosis   Sepsis suspect secondary to Pseudomonas pneumonia/aspiration pneumonia Leukocytosis 17K on 04/04/19 from 22K, tachycardia and elevated procalcitonin Sepsis physiology is resolving Procalcitonin 0.21 on 04/05/19 from  0.4 on 04/04/19 from 1.28 on 04/02/2019 Continue with cefepime and IV azithromycin Sputum culture grew Pseudomonas, continue to follow cultures for sensitivities Blood cultures negative to date C/ to follow cultures Monitor fever curve and WBC Repeat CBC in the morning  Acute hypoxic respiratory failure suspect secondary to Pseudomonas/aspiration pneumonia Not on oxygen supplementation at baseline  O2  saturation 90% on 1 L Continue continuous pulse oximetry Continue to maintain O2 saturation greater than 92% Continue bronchodilators and frequent suctioning by RT  Post op s/p exploratory laparotomy and lysis of adhesions Management per primary  Resolved AKI  Baseline creatinine appears to be 1.0 with GFR greater than 60 Presented with creatinine 2.1 with GFR of 27 Creatinine 1.09 with GFR greater than 60 on 04/02/2019, back to his baseline Continue to monitor urine output Continue to avoid nephrotoxins Continue daily BMP  Small bilateral pleural effusions On antibiotic cefepime continue Continue to monitor volume status  Severe protein calorie malnutrition Albumin 2.1 BMI 21 with muscle loss On TPN managed by pharmacy  Resolved hypophosphatemia Phosphorus 1.5, repleted phosp 2.6 on 04/05/19  Right eighth rib fracture, incidentally found Continue close monitoring and address as indicated  Refractory hypovolemic hyponatremia Sodium 133 hypovolemic on exam On TPN, continue  T2DM with hyperglycemia-hemoglobin A1c 8.3. -Continue to hold Home meds include glipizide and Metformin.  Continue sliding scale insulin with Accu-Cheks.  Cardiovascular issues: HTN/HLD/CAD -Home meds include carvedilol, lisinopril, aspirin, statin. -Currently all of them on hold.  Continue IV Lopressor as needed with parameters Avoid hydralazine in the setting of tachycardia  COPD -Stable.  Continue bronchodilators  GERD -continue IV PPI  [name] -tobacco cessation counseling at bedside.  BPH -Flomax on hold due to n.p.o.  Monitor urine output.  Mobility:  Continue to encourage ambulation DVT prophylaxis: Heparin subcu 3 times daily Code Status:  Code Status: Full Code    .Consultants: TRH  Procedures:  NG tube placement.      Objective: Vitals:   04/05/19 0900 04/05/19 1000 04/05/19 1100 04/05/19 1200  BP: (!) 156/75 (!) 162/65 (!) 154/61 (!) 151/75   Pulse: (!) 41 (!) 43 85 93  Resp: Marland Kitchen)  26 (!) 24 (!) 21 (!) 27  Temp:      TempSrc:      SpO2: 95% 95% 95%   Weight:      Height:        Intake/Output Summary (Last 24 hours) at 04/05/2019 1245 Last data filed at 04/05/2019 0500 Gross per 24 hour  Intake 1251.17 ml  Output 3600 ml  Net -2348.83 ml   Filed Weights   04/03/19 0500 04/04/19 0419 04/05/19 0500  Weight: 66.6 kg 66.7 kg 65.3 kg    Exam:  . General: 85 y.o. year-old male Frail appearing in NAD A&O x 4. . Cardiovascular: RRR no rubs or gallops . Respiratory:Very faint rales at bases . Abdomen:Hypoactive BS NT No drainage or erythema noted from staples. . Musculoskeletal: No LE edema . Psychiatry: Mood is appropriate   Data Reviewed: CBC: Recent Labs  Lab 03/30/19 0528 03/31/19 0605 04/02/19 0441 04/02/19 1844 04/03/19 0516 04/04/19 0359  WBC 3.8* 11.5* 21.7* 22.5* 22.8* 17.0*  NEUTROABS 2.1 10.2*  --   --  18.7* 12.8*  HGB 11.3* 10.7* 11.1* 9.8* 9.2* 9.3*  HCT 34.8* 33.6* 34.0* 29.4* 27.0* 29.0*  MCV 92.8 93.9 94.2 91.9 89.7 93.5  PLT 225 217 199 176 173 [id number]   Basic Metabolic Panel: Recent Labs  Lab 04/02/19 0441 04/02/19 1844 04/02/19 1847 04/03/19 0516 04/04/19 0359 04/05/19 0518  NA 136 133*  --  133* 134* 134*  K 4.0 3.6  --  4.2 3.8 3.8  CL 100 95*  --  95* 96* 99  CO2 29 29  --  31 32 30  GLUCOSE 186* 250*  --  209* 168* 155*  BUN 38* 35*  --  35* 33* 31*  CREATININE 1.09 1.12  --  1.14 1.05 0.89  CALCIUM 8.1* 8.1*  --  7.8* 7.8* 7.2*  MG 1.9  --  1.7 1.6* 2.1 1.9  PHOS 1.5*  --  3.0 2.3* 2.9 2.6   GFR: Estimated Creatinine Clearance: 50.2 mL/min (by C-G formula based on SCr of 0.89 mg/dL). Liver Function Tests: Recent Labs  Lab 03/30/19 1002 03/31/19 0605 04/01/19 0507 04/02/19 0441 04/02/19 1844 04/04/19 0359  AST 12*  --   --  14* 12* 16  ALT 6  --   --  9 8 10   ALKPHOS 62  --   --  67 70 63  BILITOT 1.6*  --   --  0.5 0.3 0.3  PROT 5.6*  --   --  4.8* 4.6* 4.4*   ALBUMIN 2.8* 2.1* 2.1* 2.0*  2.1* 2.0* 1.7*   No results for input(s): LIPASE, AMYLASE in the last 168 hours. No results for input(s): AMMONIA in the last 168 hours. Coagulation Profile: No results for input(s): INR, PROTIME in the last 168 hours. Cardiac Enzymes: No results for input(s): CKTOTAL, CKMB, CKMBINDEX, TROPONINI in the last 168 hours. BNP (last 3 results) No results for input(s): PROBNP in the last 8760 hours. HbA1C: No results for input(s): HGBA1C in the last 72 hours. CBG: Recent Labs  Lab 04/04/19 1956 04/04/19 2328 04/05/19 0416 04/05/19 0740 04/05/19 1234  GLUCAP 164* 183* 160* 101* 165*   Lipid Profile: No results for input(s): CHOL, HDL, LDLCALC, TRIG, CHOLHDL, LDLDIRECT in the last 72 hours. Thyroid Function Tests: No results for input(s): TSH, T4TOTAL, FREET4, T3FREE, THYROIDAB in the last 72 hours. Anemia Panel: No results for input(s): VITAMINB12, FOLATE, FERRITIN, TIBC, IRON, RETICCTPCT in the last 72 hours. Urine analysis:    Component  Value Date/Time   COLORURINE YELLOW (A) 04/02/2019 1145   APPEARANCEUR CLEAR (A) 04/02/2019 1145   LABSPEC 1.013 04/02/2019 1145   PHURINE 5.0 04/02/2019 1145   GLUCOSEU 50 (A) 04/02/2019 1145   HGBUR NEGATIVE 04/02/2019 1145   BILIRUBINUR NEGATIVE 04/02/2019 1145   KETONESUR NEGATIVE 04/02/2019 1145   PROTEINUR NEGATIVE 04/02/2019 1145   NITRITE NEGATIVE 04/02/2019 1145   LEUKOCYTESUR NEGATIVE 04/02/2019 1145   Sepsis Labs: @LABRCNTIP (procalcitonin:4,lacticidven:4)  ) Recent Results (from the past 240 hour(s))  CULTURE, BLOOD (ROUTINE X 2) w Reflex to ID Panel     Status: None (Preliminary result)   Collection Time: 04/02/19  9:36 AM   Specimen: BLOOD  Result Value Ref Range Status   Specimen Description BLOOD LEFT AC  Final   Special Requests   Final    BOTTLES DRAWN AEROBIC AND ANAEROBIC Blood Culture adequate volume   Culture   Final    NO GROWTH 2 DAYS Performed at [facility], [address], [place], [place] [address]    Report Status PENDING  Incomplete  CULTURE, BLOOD (ROUTINE X 2) w Reflex to ID Panel     Status: None (Preliminary result)   Collection Time: 04/02/19  9:45 AM   Specimen: BLOOD  Result Value Ref Range Status   Specimen Description BLOOD LEFT WRIST  Final   Special Requests   Final    BOTTLES DRAWN AEROBIC AND ANAEROBIC Blood Culture adequate volume   Culture   Final    NO GROWTH 2 DAYS Performed at [facility], [address] [place], [place] [address]    Report Status PENDING  Incomplete  Culture, respiratory     Status: None   Collection Time: 04/02/19 11:45 AM   Specimen: Tracheal Aspirate  Result Value Ref Range Status   Specimen Description   Final    TRACHEAL ASPIRATE Performed at [facility], [address], [place], [place] [address]    Special Requests   Final    NONE Performed at [facility], [address] [place], [place] [address]    Gram Stain   Final    RARE WBC PRESENT, PREDOMINANTLY PMN MODERATE SQUAMOUS EPITHELIAL CELLS PRESENT ABUNDANT GRAM NEGATIVE RODS RARE YEAST Performed at [facility], [place] [address], [place], [place] [address]    Culture FEW PSEUDOMONAS AERUGINOSA  Final   Report Status 04/05/2019 FINAL  Final   Organism ID, Bacteria PSEUDOMONAS AERUGINOSA  Final      Susceptibility   Pseudomonas aeruginosa - MIC*    CEFTAZIDIME 4 SENSITIVE Sensitive     CIPROFLOXACIN <=0.25 SENSITIVE Sensitive     GENTAMICIN <=1 SENSITIVE Sensitive     IMIPENEM >=16 RESISTANT Resistant     PIP/TAZO 16 SENSITIVE Sensitive     CEFEPIME 4 SENSITIVE Sensitive     * FEW PSEUDOMONAS AERUGINOSA  MRSA PCR Screening     Status: None   Collection Time: 04/02/19  5:48 PM   Specimen: Nasopharyngeal  Result Value Ref Range Status   MRSA by PCR NEGATIVE NEGATIVE Final    Comment:        The GeneXpert MRSA Assay (FDA approved for NASAL specimens only), is one component of a  comprehensive MRSA colonization surveillance program. It is not intended to diagnose MRSA infection nor to guide or monitor treatment for MRSA infections. Performed at [facility], [address], [place],  [address]       Studies: No results found.  Scheduled Meds: .  Chlorhexidine Gluconate Cloth  6 each Topical Daily  . enoxaparin (LOVENOX) injection  40 mg Subcutaneous Q24H  . insulin aspart  0-15 Units Subcutaneous Q4H  . mouth rinse  15 mL Mouth Rinse BID  . metoprolol tartrate  5 mg Intravenous Q6H  . mometasone-formoterol  2 puff Inhalation BID  . nicotine  14 mg Transdermal Daily  . pantoprazole (PROTONIX) IV  40 mg Intravenous QHS  . polyvinyl alcohol  1 drop Both Eyes BID    Continuous Infusions: . Marland KitchenTPN (CLINIMIX-E) Adult 80 mL/hr at 04/04/19 1727  . Marland KitchenTPN (CLINIMIX-E) Adult    . sodium chloride Stopped (04/04/19 0550)  . azithromycin 500 mg (04/04/19 1751)  . ceFEPime (MAXIPIME) IV 2 g (04/05/19 1034)  . Fat emulsion    . metronidazole 500 mg (04/05/19 0542)     LOS: 11 days     Kayleen Memos, MD [facility] Pager [phone]  If 7PM-7AM, please contact night-coverage [email or web address] Password [email or web address] 04/05/2019, 12:45 PM

## 2019-04-05 NOTE — Consult Note (Signed)
PHARMACY - ADULT TOTAL PARENTERAL NUTRITION CONSULT NOTE   Pharmacy Consult for TPN Indication: Bowel obstruction  Patient Measurements: Height: 5\' 1"  (154.9 cm) Weight: 143 lb 15.4 oz (65.3 kg) IBW/kg (Calculated) : 52.3 TPN AdjBW (KG): 55.6 Body mass index is 27.2 kg/m.   Assessment:  Micheal [name] a 85 y.o.malewith Past medical history ofCOPD, CAD, type II DM, GERD, HTN, HLD, active smoker. Patient will likely have surgery today, d/t SBO and has been NPO. He is noted to be clinically improved this morning with reports of flatus 5 Days Post-Op s/pexploratory laparotomy and lysis of adhesionsfor small bowel obstruction.  Since starting the TPN, the patient's blood glucose has been uncontrolled particularly around the time a new bag is given. However, today there is improvement over the previous 24 hours  Anticoagulant: LMWH Hgb 12.2-->9.2 --> 9.3 PLT wnl GI: PPI IV -LBM 10/31 Endo: moderate SSI Q4H, insulin in TPN - BG 101-183 A1c 8.3 Insulin requirements in the past 24 hours: 15 u (Novolog) Lytes:  Na 139-->133 --> 134 (fluids d/c 11/12) K 3.8 Phosphorous 2.6 Magnesium 1.9 Renal: Scr 2.14--->1.14--> 1.05 --> 0.89 Pulm: Dulera  Cards: metoprolol IV 5 mg Q6H Hepatobil: Neuro: ID:WBC 6 > 3.8 > 11.5 ---> 22.8--> 17.0 11/12 Aspiration PNA    TPN Access:  PICC line placement 11/9  TPN start date: 03/30/2019 Nutritional Goals (per RD recommendation on 03/30/2019): Cal: 1863 Protein: 96 g Fluid: 1.6-1.8 L Goal TPN rate is 83 ml/hr (Provides 1863 calories and 96 grams of protein)  Current Nutrition:   Plan:   Continue Clinimix E 5/15 TPN at 80 mL/hr  Continue 20% lipid emulsion 221ml daily @ 20 mL/hr x 12 hrs   This TPN provides 384 kcal of protein and 979.2 kcal of dextrose which provides 1363.2 kCals per day, meeting 90% of patient needs  Electrolytes in TPN: No additional  Add MVI daily, trace elements daily, thiamine 100 mg daily, 14 units of  regular insulin to TPN  Continue Q4H moderate/NPO SSI and adjust as needed  -Monitor TPN labs:   Albumin Monday and Thursday  BMP, Mag, Phosphorus Monday and Thursday  Liver function weekly  TG weekly   -F/U: Electrolytes with AM labs and monitor BG   Rocky Morel, PharmD, BCPS 04/05/2019,9:11 AM

## 2019-04-05 NOTE — Consult Note (Signed)
Pharmacy Antibiotic Note  Micheal [name] is a 89 YOM admitted on 03/25/2019 with complaints of abdominal pain, nausea, vomiting, and constipation. CT abdomen was indicative of SBO. PMH includes: COPD, CAD, T2DM, GERD, HTN, HLD, and active smoker.    11/12: Patient was examined for worsening rhonchorous sounds and increasing O2 demand overnight. CXR and leukocytosis was suggestive of aspiration PNA.    11/13: Respiratory culture came back with pseudomonas aeruginosa. Coverage was broadened to azithromycin, cefepime, and flagyl. Unasyn discontinued. Pharmacy has been consulted for cefepime dosing and monitoring.  NKDA   Plan: - Continue cefepime 2g IV q12h (due to CrCl 30-60 mL/min) - Monitor renal function and adjust as needed.  Height: 5\' 1"  (154.9 cm) Weight: 143 lb 15.4 oz (65.3 kg) IBW/kg (Calculated) : 52.3  Temp (24hrs), Avg:98.5 F (36.9 C), Min:98 F (36.7 C), Max:99 F (37.2 C)  Recent Labs  Lab 03/30/19 0528 03/31/19 0605  04/02/19 0441 04/02/19 0547 04/02/19 1844 04/03/19 0516 04/04/19 0359 04/05/19 0518  WBC 3.8* 11.5*  --  21.7*  --  22.5* 22.8* 17.0*  --   CREATININE 1.41* 1.67*   < > 1.09  --  1.12 1.14 1.05 0.89  LATICACIDVEN 0.8  --   --   --  1.8  --   --   --   --    < > = values in this interval not displayed.    Estimated Creatinine Clearance: 50.2 mL/min (by C-G formula based on SCr of 0.89 mg/dL).    No Known Allergies  Antimicrobials this admission: Ertapenem 11/9 x 1  Unasyn 11/12 >> 11/13 Azithromycin 11/13 >>  Cefepime 11/13 >> Metronidazole 11/13 >>    Microbiology results: 11/12 trach aspirate: few PSA sensitive to cefepime 11/12 Blood Cx: NGTD 11/12 MRSA PCR neg 11/12 COVID: negative   Thank you for allowing pharmacy to be a part of this patient's care.  Rocky Morel, PharmD, BCPS 04/05/2019 10:31 AM

## 2019-04-06 ENCOUNTER — Inpatient Hospital Stay: Payer: Medicare Other

## 2019-04-06 LAB — BASIC METABOLIC PANEL
Anion gap: 8 (ref 5–15)
BUN: 36 mg/dL — ABNORMAL HIGH (ref 8–23)
CO2: 31 mmol/L (ref 22–32)
Calcium: 8 mg/dL — ABNORMAL LOW (ref 8.9–10.3)
Chloride: 95 mmol/L — ABNORMAL LOW (ref 98–111)
Creatinine, Ser: 1.13 mg/dL (ref 0.61–1.24)
GFR calc Af Amer: >60 mL/min (ref >60)
GFR calc non Af Amer: 59 mL/min — ABNORMAL LOW (ref >60)
Glucose, Bld: 157 mg/dL — ABNORMAL HIGH (ref 70–99)
Potassium: 4.5 mmol/L (ref 3.5–5.1)
Sodium: 134 mmol/L — ABNORMAL LOW (ref 135–145)

## 2019-04-06 LAB — GLUCOSE, CAPILLARY
Glucose-Capillary: 129 mg/dL — ABNORMAL HIGH (ref 70–99)
Glucose-Capillary: 123 mg/dL — ABNORMAL HIGH (ref 70–99)
Glucose-Capillary: 111 mg/dL — ABNORMAL HIGH (ref 70–99)
Glucose-Capillary: 176 mg/dL — ABNORMAL HIGH (ref 70–99)
Glucose-Capillary: 191 mg/dL — ABNORMAL HIGH (ref 70–99)

## 2019-04-06 LAB — TRIGLYCERIDES: Triglycerides: 40 mg/dL (ref <150)

## 2019-04-06 LAB — HEPATIC FUNCTION PANEL
ALT: 11 U/L (ref 0–44)
AST: 15 U/L (ref 15–41)
Albumin: 1.9 g/dL — ABNORMAL LOW (ref 3.5–5.0)
Alkaline Phosphatase: 55 U/L (ref 38–126)
Bilirubin, Direct: <0.1 mg/dL (ref 0.0–0.2)
Total Bilirubin: 0.4 mg/dL (ref 0.3–1.2)
Total Protein: 5 g/dL — ABNORMAL LOW (ref 6.5–8.1)

## 2019-04-06 LAB — CBC
HCT: 30.4 % — ABNORMAL LOW (ref 39.0–52.0)
Hemoglobin: 10.3 g/dL — ABNORMAL LOW (ref 13.0–17.0)
MCH: 29.9 pg (ref 26.0–34.0)
MCHC: 33.9 g/dL (ref 30.0–36.0)
MCV: 88.1 fL (ref 80.0–100.0)
Platelets: 243 10*3/uL (ref 150–400)
RBC: 3.45 MIL/uL — ABNORMAL LOW (ref 4.22–5.81)
RDW: 14.5 % (ref 11.5–15.5)
WBC: 15.4 10*3/uL — ABNORMAL HIGH (ref 4.0–10.5)
nRBC: 0 % (ref 0.0–0.2)

## 2019-04-06 LAB — MAGNESIUM: Magnesium: 1.9 mg/dL (ref 1.7–2.4)

## 2019-04-06 LAB — PHOSPHORUS: Phosphorus: 2.9 mg/dL (ref 2.5–4.6)

## 2019-04-06 MED ORDER — FAT EMULSION PLANT BASED 20 % IV EMUL
250.0000 mL | INTRAVENOUS | Status: AC
  Administered 2019-04-06: 19:00:00 250 mL via INTRAVENOUS
  Filled 2019-04-06 (×2): qty 250

## 2019-04-06 MED ORDER — BISACODYL 10 MG RE SUPP
10.0000 mg | Freq: Every day | RECTAL | Status: DC | PRN
  Administered 2019-04-06 – 2019-04-10 (×3): 10 mg via RECTAL
  Filled 2019-04-06 (×3): qty 1

## 2019-04-06 MED ORDER — TRACE MINERALS CR-CU-MN-SE-ZN 10-1000-500-60 MCG/ML IV SOLN
INTRAVENOUS | Status: AC
  Administered 2019-04-06: 17:00:00 via INTRAVENOUS
  Filled 2019-04-06: qty 1920

## 2019-04-06 NOTE — Consult Note (Signed)
PHARMACY - ADULT TOTAL PARENTERAL NUTRITION CONSULT NOTE   Pharmacy Consult for TPN Indication: Bowel obstruction  Patient Measurements: Height: 5\' 1"  (154.9 cm) Weight: 141 lb 15.6 oz (64.4 kg) IBW/kg (Calculated) : 52.3 TPN AdjBW (KG): 55.6 Body mass index is 26.83 kg/m.   Assessment:  Micheal [name] a 85 y.o.malewith Past medical history ofCOPD, CAD, type II DM, GERD, HTN, HLD, active smoker. Patient will likely have surgery today, d/t SBO and has been NPO. He is noted to be clinically improved this morning with reports of flatus 5 Days Post-Op s/pexploratory laparotomy and lysis of adhesionsfor small bowel obstruction.  Anticoagulant: LMWH Hgb 12.2-->9.2 --> 9.3 PLT wnl GI: PPI IV -LBM 10/31 Endo: moderate SSI Q4H, insulin in TPN - BG 101-183 A1c 8.3 Insulin requirements in the past 24 hours: 14 u (Novolog) Lytes:  Na 139-->133 --> 134 (fluids d/c 11/12) All other electrolytes wnl Renal: Scr 2.14--->1.13 Pulm:  Cards:  Hepatobil: Neuro: ID:WBC 6 > 3.8 > 11.5 ---> 22.8--> 15.4 11/12 Aspiration PNA w/ Psudomonas in tracheal aspirate  Cefepime 11/12>> Azithromycin 11/12>> Metronidazole 11/12>>11/16 TPN Access:  PICC line placement 11/9  TPN start date: 03/30/2019 Nutritional Goals (per RD recommendation on 03/30/2019): Cal: 1863 Protein: 96 g Fluid: 1.6-1.8 L Goal TPN rate is 83 ml/hr (Provides 1863 calories and 96 grams of protein)  Current Nutrition:   Plan:   Continue Clinimix E 5/15 TPN at 80 mL/hr  Continue 20% lipid emulsion 268ml daily @ 20 mL/hr x 12 hrs   This TPN provides 384 kcal of protein and 979.2 kcal of dextrose which provides 1363.2 kCals per day, meeting 90% of patient needs  Electrolytes in TPN: No additional  Add MVI daily, trace elements daily, thiamine 123XX123 mg daily, folic acid 1 mg, 14 units of regular insulin to TPN  Continue Q4H moderate/NPO SSI and adjust as needed  -Monitor TPN labs:   Albumin Monday and  Thursday  BMP, Mag, Phosphorus Monday and Thursday  Liver function weekly  TG weekly   -F/U: Electrolytes with AM labs and monitor BG   Dallie Piles, PharmD 04/06/2019,7:21 AM

## 2019-04-06 NOTE — Progress Notes (Signed)
Order received from Dr Nevada Crane for physical therapy

## 2019-04-06 NOTE — Anesthesia Postprocedure Evaluation (Signed)
Anesthesia Post Note  Patient: Micheal [name]  Procedure(s) Performed: EXPLORATORY LAPAROTOMY, POSSIBLE BOWEL RESECTION, (N/A )  Patient location during evaluation: PACU Anesthesia Type: General Level of consciousness: awake and alert Pain management: pain level controlled Vital Signs Assessment: post-procedure vital signs reviewed and stable Respiratory status: spontaneous breathing, nonlabored ventilation, respiratory function stable and patient connected to nasal cannula oxygen Cardiovascular status: blood pressure returned to baseline and stable Postop Assessment: no apparent nausea or vomiting Anesthetic complications: no     Last Vitals:  Vitals:   04/06/19 1228 04/06/19 1715  BP: 134/63 (!) 147/78  Pulse: 97 98  Resp: 18   Temp: 36.6 C   SpO2: 96%     Last Pain:  Vitals:   04/06/19 1228  TempSrc: Oral  PainSc:                  Molli Barrows

## 2019-04-06 NOTE — Progress Notes (Signed)
[facility] Hospital Day(s): 12.   Post op day(s): 7 Days Post-Op.   Interval History:  Patient seen and examined no acute events or new complaints overnight, started on Valtrex yesterday for Zoster Patient reports he is feeling better.  Abdominal distension still present, pain improved, no nausea or emesis Passing flatus yesterday but none this morning.  NGT with 220 ccs out in last 24 hours Leukocytosis improving; down to 15.4 Renal function appears baseline, good U/O   Vital signs in last 24 hours: [min-max] current  Temp:  [97.7 F (36.5 C)-98.7 F (37.1 C)] 98.7 F (37.1 C) (11/16 0534) Pulse Rate:  [41-93] 48 (11/16 0534) Resp:  [16-26] 16 (11/16 0534) BP: (132-169)/(48-78) 132/48 (11/16 0534) SpO2:  [95 %-97 %] 97 % (11/16 0534) Weight:  [64.4 kg] 64.4 kg (11/16 0500)     Height: 5\' 1"  (154.9 cm) Weight: 64.4 kg BMI (Calculated): 26.84   Intake/Output last 2 shifts:  11/15 0701 - 11/16 0700 In: 3326.7 [I.V.:2253.8; NG/GT:90; IV Piggyback:982.9] Out: [id number] [Urine:3650; Emesis/NG output:220]   Physical Exam:  Constitutional: alert, cooperative and no distress HEENT: NGT in place- flushing easily Respiratory: breathing non-labored at rest, rhonchorous breath soundsimproving Cardiovascular:tachycardicand sinus rhythm  Gastrointestinal: soft,incisional soreness, improvement in abdominal distension, no rebound/guarding. Integumentary:Laparotomy incision is CDI with staples, no erythema or drainage Genitourinary: Foley in place   Labs:  CBC Latest Ref Rng & Units 04/06/2019 04/04/2019 04/03/2019  WBC 4.0 - 10.5 K/uL 15.4(H) 17.0(H) 22.8(H)  Hemoglobin 13.0 - 17.0 g/dL 10.3(L) 9.3(L) 9.2(L)  Hematocrit 39.0 - 52.0 % 30.4(L) 29.0(L) 27.0(L)  Platelets 150 - 400 K/uL 243 184 173   CMP Latest Ref Rng & Units 04/06/2019 04/05/2019 04/04/2019  Glucose 70 - 99 mg/dL 157(H) 155(H) 168(H)  BUN 8 - 23 mg/dL 36(H) 31(H) 33(H)   Creatinine 0.61 - 1.24 mg/dL 1.13 0.89 1.05  Sodium 135 - 145 mmol/L 134(L) 134(L) 134(L)  Potassium 3.5 - 5.1 mmol/L 4.5 3.8 3.8  Chloride 98 - 111 mmol/L 95(L) 99 96(L)  CO2 22 - 32 mmol/L 31 30 32  Calcium 8.9 - 10.3 mg/dL 8.0(L) 7.2(L) 7.8(L)  Total Protein 6.5 - 8.1 g/dL 5.0(L) - 4.4(L)  Total Bilirubin 0.3 - 1.2 mg/dL 0.4 - 0.3  Alkaline Phos 38 - 126 U/L 55 - 63  AST 15 - 41 U/L 15 - 16  ALT 0 - 44 U/L 11 - 10    Imaging studies:   KUB (04/06/2019) personally reviewed still with bowel distension and radiologist report reviewed:  IMPRESSION: Similar findings most suggestive of ileus.   Assessment/Plan:  85 y.o. male still waiting significant bowel function return 7 Days Post-Op s/p exploratory laparotomy and lysis of adhesionsfor small bowel obstruction, complicated by likely aspiration PNA with cultures growing pseudomonas and now with suspected zoster on started on Valtrex 11/15   - Continue IV Abx (Cefepime >> Day 2)             - monitor leukocytosis             - replete electrolytes               - Continue NGT decompression to LIS; monitor output - await significant bowel function  - Add suppository             - NPO + TPN; monitor nutritional labs - pain control prn (minimize narcotics); antiemetics prn - monitor abdominal examination+/- KUBs - Monitor on-going bowel function - medical management  of comorbidities; hold home medications for now - Mobilization encouraged; PT following   All of the above findings and recommendations were discussed with the patient, and the medical team, and all of patient's questions were answered to his expressed satisfaction.  -- Edison Simon, PA-C  [facility] 04/06/2019, 7:39 AM [phone] M-F: 7am - 4pm

## 2019-04-06 NOTE — Evaluation (Signed)
Physical Therapy Re-Evaluation Patient Details Name: Micheal [name] MRN: [id number] DOB: 1933-02-17 Today's Date: 04/06/2019   History of Present Illness  Pt is an 85 y.o. male presenting to hospital 11/4 with several days of generalized abdominal discomfort, vomiting, and nausea.  Pt admitted with SBO with NG tube placed. Pt s/p 11/9 exploratory lap and lysis of adhesions.  PMH includes h/o balance problem, COPD, DM, htn, MI, PNA, cardiac cath, ORIF L ankle fx 01/10/18, L distal radius fx 01/10/19, anterolateral R 9th and 10th rib fx's 11/27/18.    Clinical Impression  Pt presented with deficits in strength, transfers, mobility, gait, balance, and activity tolerance.  Pt required a heavy Mod A with bed mobility tasks most notably for trunk control and positioning.  Pt slumped in sitting at the EOB with difficulty maintaining upright sitting posture.  Pt c/o needing a bedpan while in sitting and it was observed that the pt had a BM.  Pt returned to supine with CNA entering room and assisting pt onto bedpan to finish BM.  Current goals remain appropriate for pt.  Pt will benefit from PT services in a SNF setting upon discharge to safely address above deficits for decreased caregiver assistance and eventual return to PLOF.      Follow Up Recommendations SNF    Equipment Recommendations  Rolling walker with 5" wheels;3in1 (PT);Other (comment)(TBD at next venue of care upon discharge to SNF)    Recommendations for Other Services       Precautions / Restrictions Precautions Precautions: Fall Precaution Comments: NG tube; NPO Restrictions Weight Bearing Restrictions: No      Mobility  Bed Mobility Overal bed mobility: Needs Assistance Bed Mobility: Rolling;Sidelying to Sit;Sit to Sidelying Rolling: Mod assist Sidelying to sit: Mod assist     Sit to sidelying: Mod assist General bed mobility comments: Mod A with log roll training provided for decreased abdominal strain  Transfers                  General transfer comment: NT secondary to pt having BM once up in sitting at the EOB  Ambulation/Gait                Stairs            Wheelchair Mobility    Modified Rankin (Stroke Patients Only)       Balance Overall balance assessment: Needs assistance Sitting-balance support: Feet supported;Single extremity supported Sitting balance-Leahy Scale: Fair                                       Pertinent Vitals/Pain Pain Assessment: No/denies pain    Home Living Family/patient expects to be discharged to:: Private residence Living Arrangements: Spouse/significant other Available Help at Discharge: Family Type of Home: House Home Access: Level entry     Home Layout: One level   Additional Comments: Toilet riser    Prior Function Level of Independence: Independent         Comments: H/o falls     Hand Dominance        Extremity/Trunk Assessment   Upper Extremity Assessment Upper Extremity Assessment: Generalized weakness    Lower Extremity Assessment Lower Extremity Assessment: Generalized weakness       Communication   Communication: No difficulties  Cognition Arousal/Alertness: Awake/alert Behavior During Therapy: WFL for tasks assessed/performed Overall Cognitive Status: Within Functional Limits for tasks assessed  General Comments      Exercises Total Joint Exercises Ankle Circles/Pumps: Strengthening;Both;5 reps;10 reps Quad Sets: Strengthening;Both;5 reps;10 reps Gluteal Sets: Strengthening;Both;5 reps;10 reps Hip ABduction/ADduction: AROM;AAROM;Both;5 reps Long Arc Quad: AROM;Both;10 reps;Strengthening Knee Flexion: AROM;Strengthening;Both;10 reps Other Exercises Other Exercises: Log roll technique training Other Exercises: HEP education for BLE APs, QS, and GS x 10 each every 1-2 hours daily   Assessment/Plan    PT Assessment  Patient needs continued PT services  PT Problem List Decreased strength;Decreased balance;Decreased mobility;Decreased knowledge of use of DME;Decreased activity tolerance       PT Treatment Interventions DME instruction;Gait training;Functional mobility training;Therapeutic activities;Therapeutic exercise;Balance training;Patient/family education    PT Goals (Current goals can be found in the Care Plan section)  Acute Rehab PT Goals Patient Stated Goal: to go home PT Goal Formulation: With patient Time For Goal Achievement: 04/19/19 Potential to Achieve Goals: Fair    Frequency Min 2X/week   Barriers to discharge Decreased caregiver support      Co-evaluation               AM-PAC PT "6 Clicks" Mobility  Outcome Measure Help needed turning from your back to your side while in a flat bed without using bedrails?: A Lot Help needed moving from lying on your back to sitting on the side of a flat bed without using bedrails?: A Lot Help needed moving to and from a bed to a chair (including a wheelchair)?: A Lot Help needed standing up from a chair using your arms (e.g., wheelchair or bedside chair)?: A Lot Help needed to walk in hospital room?: A Lot Help needed climbing 3-5 steps with a railing? : Total 6 Click Score: 11    End of Session   Activity Tolerance: Patient tolerated treatment well Patient left: in bed;with call bell/phone within reach;with bed alarm set;with family/visitor present;Other (comment)(Nursing assisted pt onto bedpan with instructions to pt/spouse to call when ready) Nurse Communication: Mobility status PT Visit Diagnosis: Difficulty in walking, not elsewhere classified (R26.2);Muscle weakness (generalized) (M62.81)    Time: 1410-1443 PT Time Calculation (min) (ACUTE ONLY): 33 min   Charges:   PT Evaluation $PT Re-evaluation: 1 Re-eval PT Treatments $Therapeutic Exercise: 8-22 mins        D. Royetta Asal PT, DPT 04/06/19, 3:56 PM

## 2019-04-06 NOTE — Progress Notes (Signed)
Consult PROGRESS NOTE  Micheal [name] [id number] DOB: 13-Jun-1932 DOA: 03/25/2019 PCP: Gweneth Fritter, MD  HPI/Recap of past 24 hours: Micheal [name] a 85 y.o.malewith Past medical history ofCOPD, CAD, type II DM, GERD, HTN, HLD, active [name]. Patient presented to the ED on 11/5 from home with complaint of abdominal pain, nausea and vomiting, last bowel movement 10/31. CT abdomen showed high-grade small bowel obstruction with transition point in the central lower abdomen potentially due to an adhesion.   He was admitted under general surgery service.  Medical consultation was obtained for comanagement of medical issues. Patient failed conservative management for bowel obstruction. 11/9, underwent exploratory laparotomy with adhesiolysis.  04/05/19: Pt seen and examined at his bedside. Feels better today. Abd pain only when he coughs. + Flatus. Bedside RN Ms. Rochel Brome noted shingle lesions affecting left buttock and left lateral lower extremity. Lesions do not cross the midline. Lesions interestingly enough are not painful. Some crusted areas. Will move to negative pressure room with droplet/airborne/contact precautions and will treat for zoster. Started valtrex 1000 mg daily x 7 days.  04/06/19: Seen and examined at his bedside. NG tube to suction. abd xray no significant changes. Denies abd pain or flatus this am.     Assessment/Plan: Principal Problem:   SBO (small bowel obstruction) (HCC) Active Problems:   Type 2 diabetes mellitus, uncontrolled, with renal complications (HCC)   COPD (chronic obstructive pulmonary disease) (HCC)   Gastroesophageal reflux disease without esophagitis   HTN (hypertension)   [name]   Acute renal failure superimposed on stage 3b chronic kidney disease (HCC)   Hyponatremia   Leucocytosis   Sepsis suspect secondary to Pseudomonas pneumonia/aspiration pneumonia Leukocytosis 17K on 04/04/19 from 22K, tachycardia and elevated  procalcitonin WBC trending down>> 15K from 17K Sepsis physiology is resolving Procalcitonin 0.21 on 04/05/19 from  0.4 on 04/04/19 from 1.28 on 04/02/2019 Continue with cefepime and IV azithromycin Sputum culture grew Pseudomonas, continue to follow cultures for sensitivities Blood cultures neg to date C/ to follow cultures Monitor fever curve and WBC Repeat CBC in the morning  Acute hypoxic respiratory failure suspect secondary to Pseudomonas/aspiration pneumonia Not on oxygen supplementation at baseline  O2 saturation 90% on 1 L Continue continuous pulse oximetry Continue to maintain O2 saturation greater than 92% Continue bronchodilators and frequent suctioning by RT  Suspected zoster No zoster vaccination previously Lesion on left buttock and lateral LLE Started valtrex on 04/05/19 1000 mg daily x 7 days Consult wound care specialist  Post op s/p exploratory laparotomy and lysis of adhesions Management per primary  Resolved AKI  Baseline creatinine appears to be 1.0 with GFR greater than 60 Presented with creatinine 2.1 with GFR of 27 Creatinine 1.09 with GFR greater than 60 on 04/02/2019, back to his baseline Continue to monitor urine output Continue to avoid nephrotoxins Continue daily BMP  Small bilateral pleural effusions On antibiotic cefepime continue Continue to monitor volume status  Severe protein calorie malnutrition Albumin 2.1 BMI 21 with muscle loss On TPN managed by pharmacy  Resolved hypophosphatemia Phosphorus 1.5, repleted phosp 2.6 on 04/05/19  Right eighth rib fracture, incidentally found Continue close monitoring and address as indicated  Refractory hypovolemic hyponatremia Sodium 133 hypovolemic on exam On TPN, continue  T2DM with hyperglycemia-hemoglobin A1c 8.3. -Continue to hold Home meds include glipizide and Metformin.  Continue sliding scale insulin with Accu-Cheks.  Cardiovascular issues: HTN/HLD/CAD -Home meds include  carvedilol, lisinopril, aspirin, statin. -Currently all of them on hold.  Continue IV  Lopressor as needed with parameters Avoid hydralazine in the setting of tachycardia  COPD -Stable.  Continue bronchodilators  GERD -continue IV PPI  [name] -tobacco cessation counseling at bedside.  BPH -Flomax on hold due to n.p.o.  Monitor urine output.  Mobility:  Continue to encourage ambulation DVT prophylaxis: Heparin subcu 3 times daily Code Status:  Code Status: Full Code    .Consultants: TRH  Procedures:  NG tube placement.      Objective: Vitals:   04/05/19 1506 04/06/19 0500 04/06/19 0534 04/06/19 1228  BP: (!) 149/62  (!) 132/48 134/63  Pulse: 87  (!) 48 97  Resp:   16 18  Temp: 97.7 F (36.5 C)  98.7 F (37.1 C) 97.9 F (36.6 C)  TempSrc: Oral   Oral  SpO2: 97%  97% 96%  Weight:  64.4 kg    Height:        Intake/Output Summary (Last 24 hours) at 04/06/2019 1621 Last data filed at 04/06/2019 1500 Gross per 24 hour  Intake 2336.7 ml  Output 3200 ml  Net -863.3 ml   Filed Weights   04/04/19 0419 04/05/19 0500 04/06/19 0500  Weight: 66.7 kg 65.3 kg 64.4 kg    Exam:  . General: 85 y.o. year-old male Frail in NAD A&O x 4 . Cardiovascular: RRR no rubs or gallops . Respiratory:Faint rales at bases no wheezes . Abdomen:Hypoactive BS NT . Musculoskeletal: No LE edema . Psychiatry: Mood is appropriate  Data Reviewed: CBC: Recent Labs  Lab 03/31/19 0605 04/02/19 0441 04/02/19 1844 04/03/19 0516 04/04/19 0359 04/06/19 0652  WBC 11.5* 21.7* 22.5* 22.8* 17.0* 15.4*  NEUTROABS 10.2*  --   --  18.7* 12.8*  --   HGB 10.7* 11.1* 9.8* 9.2* 9.3* 10.3*  HCT 33.6* 34.0* 29.4* 27.0* 29.0* 30.4*  MCV 93.9 94.2 91.9 89.7 93.5 88.1  PLT 217 199 176 173 184 0000000   Basic Metabolic Panel: Recent Labs  Lab 04/02/19 1844 04/02/19 1847 04/03/19 0516 04/04/19 0359 04/05/19 0518 04/06/19 0652  NA 133*  --  133* 134* 134* 134*  K 3.6  --   4.2 3.8 3.8 4.5  CL 95*  --  95* 96* 99 95*  CO2 29  --  31 32 30 31  GLUCOSE 250*  --  209* 168* 155* 157*  BUN 35*  --  35* 33* 31* 36*  CREATININE 1.12  --  1.14 1.05 0.89 1.13  CALCIUM 8.1*  --  7.8* 7.8* 7.2* 8.0*  MG  --  1.7 1.6* 2.1 1.9 1.9  PHOS  --  3.0 2.3* 2.9 2.6 2.9   GFR: Estimated Creatinine Clearance: 39.3 mL/min (by C-G formula based on SCr of 1.13 mg/dL). Liver Function Tests: Recent Labs  Lab 04/01/19 0507 04/02/19 0441 04/02/19 1844 04/04/19 0359 04/06/19 0652  AST  --  14* 12* 16 15  ALT  --  9 8 10 11   ALKPHOS  --  67 70 63 55  BILITOT  --  0.5 0.3 0.3 0.4  PROT  --  4.8* 4.6* 4.4* 5.0*  ALBUMIN 2.1* 2.0*  2.1* 2.0* 1.7* 1.9*   No results for input(s): LIPASE, AMYLASE in the last 168 hours. No results for input(s): AMMONIA in the last 168 hours. Coagulation Profile: No results for input(s): INR, PROTIME in the last 168 hours. Cardiac Enzymes: No results for input(s): CKTOTAL, CKMB, CKMBINDEX, TROPONINI in the last 168 hours. BNP (last 3 results) No results for input(s): PROBNP in the last 8760  hours. HbA1C: No results for input(s): HGBA1C in the last 72 hours. CBG: Recent Labs  Lab 04/05/19 2107 04/05/19 2351 04/06/19 0530 04/06/19 0746 04/06/19 1223  GLUCAP 173* 170* 129* 123* 111*   Lipid Profile: Recent Labs    04/06/19 0652  TRIG 40   Thyroid Function Tests: No results for input(s): TSH, T4TOTAL, FREET4, T3FREE, THYROIDAB in the last 72 hours. Anemia Panel: No results for input(s): VITAMINB12, FOLATE, FERRITIN, TIBC, IRON, RETICCTPCT in the last 72 hours. Urine analysis:    Component Value Date/Time   COLORURINE YELLOW (A) 04/02/2019 1145   APPEARANCEUR CLEAR (A) 04/02/2019 1145   LABSPEC 1.013 04/02/2019 1145   PHURINE 5.0 04/02/2019 1145   GLUCOSEU 50 (A) 04/02/2019 1145   HGBUR NEGATIVE 04/02/2019 1145   BILIRUBINUR NEGATIVE 04/02/2019 1145   KETONESUR NEGATIVE 04/02/2019 1145   PROTEINUR NEGATIVE 04/02/2019 1145    NITRITE NEGATIVE 04/02/2019 1145   LEUKOCYTESUR NEGATIVE 04/02/2019 1145   Sepsis Labs: @LABRCNTIP (procalcitonin:4,lacticidven:4)  ) Recent Results (from the past 240 hour(s))  CULTURE, BLOOD (ROUTINE X 2) w Reflex to ID Panel     Status: None (Preliminary result)   Collection Time: 04/02/19  9:36 AM   Specimen: BLOOD  Result Value Ref Range Status   Specimen Description BLOOD LEFT AC  Final   Special Requests   Final    BOTTLES DRAWN AEROBIC AND ANAEROBIC Blood Culture adequate volume   Culture   Final    NO GROWTH 4 DAYS Performed at [facility], [address] [place], [place] [address]    Report Status PENDING  Incomplete  CULTURE, BLOOD (ROUTINE X 2) w Reflex to ID Panel     Status: None (Preliminary result)   Collection Time: 04/02/19  9:45 AM   Specimen: BLOOD  Result Value Ref Range Status   Specimen Description BLOOD LEFT WRIST  Final   Special Requests   Final    BOTTLES DRAWN AEROBIC AND ANAEROBIC Blood Culture adequate volume   Culture   Final    NO GROWTH 4 DAYS Performed at [facility], [address] [place], [place] [address]    Report Status PENDING  Incomplete  Culture, respiratory     Status: None   Collection Time: 04/02/19 11:45 AM   Specimen: Tracheal Aspirate  Result Value Ref Range Status   Specimen Description   Final    TRACHEAL ASPIRATE Performed at [facility], [address], [place], [place] [address]    Special Requests   Final    NONE Performed at [facility], [address] [place], [place] [address]    Gram Stain   Final    RARE WBC PRESENT, PREDOMINANTLY PMN MODERATE SQUAMOUS EPITHELIAL CELLS PRESENT ABUNDANT GRAM NEGATIVE RODS RARE YEAST Performed at [facility], [place] [address] [place], [place] [address]    Culture FEW PSEUDOMONAS AERUGINOSA  Final   Report Status 04/05/2019 FINAL  Final   Organism ID, Bacteria PSEUDOMONAS AERUGINOSA  Final      Susceptibility    Pseudomonas aeruginosa - MIC*    CEFTAZIDIME 4 SENSITIVE Sensitive     CIPROFLOXACIN <=0.25 SENSITIVE Sensitive     GENTAMICIN <=1 SENSITIVE Sensitive     IMIPENEM >=16 RESISTANT Resistant     PIP/TAZO 16 SENSITIVE Sensitive     CEFEPIME 4 SENSITIVE Sensitive     * FEW PSEUDOMONAS AERUGINOSA  MRSA PCR Screening     Status: None   Collection Time: 04/02/19  5:48 PM  Specimen: Nasopharyngeal  Result Value Ref Range Status   MRSA by PCR NEGATIVE NEGATIVE Final    Comment:        The GeneXpert MRSA Assay (FDA approved for NASAL specimens only), is one component of a comprehensive MRSA colonization surveillance program. It is not intended to diagnose MRSA infection nor to guide or monitor treatment for MRSA infections. Performed at [facility], [address] [place], [place] [address]       Studies: Dg Abd 1 View  Result Date: 04/06/2019 CLINICAL DATA:  Small-bowel obstruction, post lysis of adhesions on 03/30/2019. EXAM: ABDOMEN - 1 VIEW COMPARISON:  04/03/2019; CT abdomen pelvis-04/02/2019 FINDINGS: Enteric tube tip and side port projects over the expected location of the gastric fundus. Grossly unchanged mild-to-moderate gaseous distention of multiple loops of large and small bowel. Moderate colonic stool burden. No supine evidence of pneumoperitoneum. No pneumatosis or portal venous gas Limited visualization lower thorax demonstrates blunting the bilateral costophrenic angles. Multiple skin staples overlie the midline of the lower abdomen/pelvis. No acute osseous abnormalities. Degenerative change the lower lumbar spine and bilateral hips is suspected though incompletely evaluated. IMPRESSION: Similar findings most suggestive of ileus. Electronically Signed   By: Sandi Mariscal M.D.   On: 04/06/2019 07:26    Scheduled Meds: . Chlorhexidine Gluconate Cloth  6 each Topical Daily  . enoxaparin (LOVENOX) injection  40 mg Subcutaneous Q24H  . insulin aspart  0-15 Units  Subcutaneous Q4H  . mouth rinse  15 mL Mouth Rinse BID  . metoprolol tartrate  5 mg Intravenous Q6H  . mometasone-formoterol  2 puff Inhalation BID  . nicotine  14 mg Transdermal Daily  . pantoprazole (PROTONIX) IV  40 mg Intravenous QHS  . polyvinyl alcohol  1 drop Both Eyes BID  . valACYclovir  1,000 mg Oral Daily    Continuous Infusions: . Marland KitchenTPN (CLINIMIX-E) Adult 80 mL/hr at 04/06/19 1500  . Marland KitchenTPN (CLINIMIX-E) Adult    . sodium chloride 10 mL/hr at 04/05/19 1555  . azithromycin Stopped (04/05/19 2303)  . ceFEPime (MAXIPIME) IV Stopped (04/06/19 [id number])  . Fat emulsion       LOS: 12 days     Kayleen Memos, MD [facility] Pager [phone]  If 7PM-7AM, please contact night-coverage [email or web address] Password [email or web address] 04/06/2019, 4:21 PM

## 2019-04-06 NOTE — Progress Notes (Signed)
Nutrition Follow-up  DOCUMENTATION CODES:   Not applicable  INTERVENTION:  Continue Clinimix E5/15 at 80 mL/hr (Provides 1863 calories and 96 grams of protein)  Continue 20% ILE of 285m daily x 12 hrs  Continue MVI with trace minerals added  Continue 100 mg Thiamin added daily  Continue 171mFolic acid added daily  NUTRITION DIAGNOSIS:   Inadequate oral intake related to acute illness(SBO) as evidenced by NPO status.  ongoing  GOAL:   Patient will meet greater than or equal to 90% of their needs  Met with TPN  MONITOR:   Diet advancement, Labs, I & O's, Weight trends, Skin, Other (Comment)(TPN)  REASON FOR ASSESSMENT:   NPO/Clear Liquid Diet    ASSESSMENT:   [age]o male Adm for SBO. PMH significant of DM, COPD, CAD, GERD, MI(2005), HTN, Hypercholestemia.  Upon chart review, pt is passing flatus, no bowel movements noted. NGT with 220 mL output in last 24 hours, positive for aspiration pneumonia and zoster. Pt moved to ICU on 11/12 for closer monitoring in setting of aspiration pneumonia and ileus, moved back to general surgery unit on 11/15. Urine output now improved from last week. (3.6 L on 11/15) Current weight of 142#, + 2# since initiation of TPN.  Review of TPN:  PICC line Clinimix E 5/15 TPN at 80 mL/hr 20% lipid emulsion 25060maily @ 20 mL/hr x 12 hrs Goal rate is 80 ml/hr  Thiamine, folic acid, MVI and trace elements added daily.  Labs reviewed: CBG range 123-170; sodium 134; chloride 95; BUN 36; BUN 36; Creatinine 1.13; phosphorus, potassium, magnesium within normal limits. TG within normal limits.  Medications reviewed:  Novalog sliding scale 0-15 units q 4 hours; nicotine patch; pantoprazole 76m69mzithromycin; cefepime   Diet Order:   Diet Order            Diet NPO time specified Except for: Sips with Meds  Diet effective now              EDUCATION NEEDS:   Education needs have been addressed  Skin:  Skin Assessment: Skin  Integrity Issues: Skin Integrity Issues:: Incisions Incisions: abdomen  Last BM:  10/31  Height:   Ht Readings from Last 1 Encounters:  04/02/19 _0  (1.549 m)    Weight:   Wt Readings from Last 1 Encounters:  04/06/19 64.4 kg    Ideal Body Weight:  67.3 kg  BMI:  Body mass index is 26.83 kg/m.  Estimated Nutritional Needs:   Kcal:  1700-1900  Protein:  85-95 grams  Fluid:  1.6-1.8 L  LaurMeda Klinefelteretetic Intern

## 2019-04-07 ENCOUNTER — Inpatient Hospital Stay: Payer: Medicare Other

## 2019-04-07 LAB — CULTURE, BLOOD (ROUTINE X 2)
Culture: NO GROWTH
Culture: NO GROWTH
Special Requests: ADEQUATE
Special Requests: ADEQUATE

## 2019-04-07 LAB — GLUCOSE, CAPILLARY
Glucose-Capillary: 163 mg/dL — ABNORMAL HIGH (ref 70–99)
Glucose-Capillary: 191 mg/dL — ABNORMAL HIGH (ref 70–99)
Glucose-Capillary: 178 mg/dL — ABNORMAL HIGH (ref 70–99)
Glucose-Capillary: 165 mg/dL — ABNORMAL HIGH (ref 70–99)
Glucose-Capillary: 145 mg/dL — ABNORMAL HIGH (ref 70–99)
Glucose-Capillary: 182 mg/dL — ABNORMAL HIGH (ref 70–99)

## 2019-04-07 LAB — CBC
HCT: 29.9 % — ABNORMAL LOW (ref 39.0–52.0)
Hemoglobin: 9.8 g/dL — ABNORMAL LOW (ref 13.0–17.0)
MCH: 29.7 pg (ref 26.0–34.0)
MCHC: 32.8 g/dL (ref 30.0–36.0)
MCV: 90.6 fL (ref 80.0–100.0)
Platelets: 281 10*3/uL (ref 150–400)
RBC: 3.3 MIL/uL — ABNORMAL LOW (ref 4.22–5.81)
RDW: 14.5 % (ref 11.5–15.5)
WBC: 18.4 10*3/uL — ABNORMAL HIGH (ref 4.0–10.5)
nRBC: 0.1 % (ref 0.0–0.2)

## 2019-04-07 LAB — BASIC METABOLIC PANEL
Anion gap: 5 (ref 5–15)
BUN: 38 mg/dL — ABNORMAL HIGH (ref 8–23)
CO2: 29 mmol/L (ref 22–32)
Calcium: 8 mg/dL — ABNORMAL LOW (ref 8.9–10.3)
Chloride: 97 mmol/L — ABNORMAL LOW (ref 98–111)
Creatinine, Ser: 1.1 mg/dL (ref 0.61–1.24)
GFR calc Af Amer: >60 mL/min (ref >60)
GFR calc non Af Amer: >60 mL/min (ref >60)
Glucose, Bld: 196 mg/dL — ABNORMAL HIGH (ref 70–99)
Potassium: 4.6 mmol/L (ref 3.5–5.1)
Sodium: 131 mmol/L — ABNORMAL LOW (ref 135–145)

## 2019-04-07 MED ORDER — TRACE MINERALS CR-CU-MN-SE-ZN 10-1000-500-60 MCG/ML IV SOLN
INTRAVENOUS | Status: AC
  Administered 2019-04-07: 17:00:00 via INTRAVENOUS
  Filled 2019-04-07: qty 1680

## 2019-04-07 MED ORDER — FAT EMULSION PLANT BASED 20 % IV EMUL
250.0000 mL | INTRAVENOUS | Status: AC
  Administered 2019-04-07: 250 mL via INTRAVENOUS
  Filled 2019-04-07: qty 250

## 2019-04-07 MED ORDER — PIPERACILLIN-TAZOBACTAM 3.375 G IVPB
3.3750 g | Freq: Three times a day (TID) | INTRAVENOUS | Status: DC
  Administered 2019-04-07 – 2019-04-08 (×3): 3.375 g via INTRAVENOUS
  Filled 2019-04-07 (×3): qty 50

## 2019-04-07 NOTE — Progress Notes (Signed)
Physical Therapy Treatment Patient Details Name: Micheal [name] MRN: [id number] DOB: February 21, 1933 Today's Date: 04/07/2019    History of Present Illness Pt is an 85 y.o. male presenting to hospital 11/4 with several days of generalized abdominal discomfort, vomiting, and nausea.  Pt admitted with SBO with NG tube placed. Pt s/p 11/9 exploratory lap and lysis of adhesions.  PMH includes h/o balance problem, COPD, DM, htn, MI, PNA, cardiac cath, ORIF L ankle fx 01/10/18, L distal radius fx 01/10/19, anterolateral R 9th and 10th rib fx's 11/27/18.    PT Comments    Pt presented with deficits in strength, transfers, mobility, gait, balance, and activity tolerance but made progress towards goals this session.  Log roll training provided with pt requiring min A for trunk control.  Pt demonstrated fair eccentric control during stand to sit but required min A to stand from sitting.  Pt was able to amb 4 x 20' this session with therapeutic rest breaks between sessions but reported no adverse symptoms other than fatigue.  Pt will benefit from PT services in a SNF setting upon discharge to safely address above deficits for decreased caregiver assistance and eventual return to PLOF.     Follow Up Recommendations  SNF     Equipment Recommendations  Rolling walker with 5" wheels;3in1 (PT);Other (comment)(TBD at next venue of care)    Recommendations for Other Services       Precautions / Restrictions Precautions Precautions: Fall Precaution Comments: NG tube; NPO Restrictions Weight Bearing Restrictions: No    Mobility  Bed Mobility Overal bed mobility: Needs Assistance Bed Mobility: Rolling;Sidelying to Sit;Sit to Sidelying Rolling: Min assist Sidelying to sit: Min assist     Sit to sidelying: Min assist General bed mobility comments: Min A with log roll training provided for decreased abdominal strain  Transfers Overall transfer level: Needs assistance Equipment used: Rolling walker (2  wheeled) Transfers: Sit to/from Stand Sit to Stand: Min assist         General transfer comment: Mod verbal cues for hand placement  Ambulation/Gait Ambulation/Gait assistance: Min guard Gait Distance (Feet): 20 Feet x 4 Assistive device: Rolling walker (2 wheeled) Gait Pattern/deviations: Step-through pattern;Decreased step length - right;Decreased step length - left;Trunk flexed Gait velocity: significantly decreased   General Gait Details: Slow cadence with short B step length but steady without LOB; min-mod verbal cues for amb closer to the [employer]    Modified Rankin (Stroke Patients Only)       Balance Overall balance assessment: Needs assistance Sitting-balance support: Feet supported;Single extremity supported Sitting balance-Leahy Scale: Fair     Standing balance support: During functional activity;Bilateral upper extremity supported Standing balance-Leahy Scale: Fair Standing balance comment: pt requiring Mod BUE lean on RW for balance during ambulation                            Cognition Arousal/Alertness: Awake/alert Behavior During Therapy: WFL for tasks assessed/performed Overall Cognitive Status: Within Functional Limits for tasks assessed                                        Exercises Total Joint Exercises Ankle Circles/Pumps: Strengthening;Both;10 reps;15 reps Quad Sets: Strengthening;Both;10 reps;15 reps Gluteal Sets: Strengthening;Both;10 reps;15 reps Towel Squeeze: Strengthening;Both;10 reps Heel Slides:  AROM;Both;10 reps Hip ABduction/ADduction: AROM;AAROM;Both;10 reps Long Arc Quad: AROM;Both;10 reps;Strengthening;15 reps Knee Flexion: AROM;Strengthening;Both;10 reps;15 reps Marching in Standing: AROM;Both;10 reps;Seated Other Exercises Other Exercises: Log roll technique training    General Comments        Pertinent Vitals/Pain Pain Assessment: No/denies pain     Home Living                      Prior Function            PT Goals (current goals can now be found in the care plan section) Progress towards PT goals: Progressing toward goals    Frequency    Min 2X/week      PT Plan Current plan remains appropriate    Co-evaluation              AM-PAC PT "6 Clicks" Mobility   Outcome Measure  Help needed turning from your back to your side while in a flat bed without using bedrails?: A Little Help needed moving from lying on your back to sitting on the side of a flat bed without using bedrails?: A Little Help needed moving to and from a bed to a chair (including a wheelchair)?: A Little Help needed standing up from a chair using your arms (e.g., wheelchair or bedside chair)?: A Little Help needed to walk in hospital room?: A Little Help needed climbing 3-5 steps with a railing? : A Lot 6 Click Score: 17    End of Session Equipment Utilized During Treatment: Gait belt;Other (comment)(Under arms to avoid incision) Activity Tolerance: Patient tolerated treatment well Patient left: in bed;with call bell/phone within reach;with bed alarm set Nurse Communication: Mobility status PT Visit Diagnosis: Difficulty in walking, not elsewhere classified (R26.2);Muscle weakness (generalized) (M62.81)     Time: [id number] PT Time Calculation (min) (ACUTE ONLY): 41 min  Charges:  $Gait Training: 8-22 mins $Therapeutic Exercise: 8-22 mins $Therapeutic Activity: 8-22 mins                     D. Scott Carlton Buskey PT, DPT 04/07/19, 1:33 PM

## 2019-04-07 NOTE — Consult Note (Signed)
PHARMACY - ADULT TOTAL PARENTERAL NUTRITION CONSULT NOTE   Pharmacy Consult for TPN Indication: Bowel obstruction  Patient Measurements: Height: 5\' 1"  (154.9 cm) Weight: 141 lb 1.5 oz (64 kg) IBW/kg (Calculated) : 52.3 TPN AdjBW (KG): 55.6 Body mass index is 26.66 kg/m.   Assessment:  Micheal [name] a 85 y.o.malewith Past medical history ofCOPD, CAD, type II DM, GERD, HTN, HLD, active smoker. Patient will likely have surgery today, d/t SBO and has been NPO. He is noted to be clinically improved this morning with reports of flatus 8 Days Post-Op s/pexploratory laparotomy and lysis of adhesionsfor small bowel obstruction.  Anticoagulant: LMWH Hgb 12.2-->9.2 --> 9.3 PLT wnl GI: PPI IV -LBM 11/16 Endo: moderate SSI Q4H, insulin in TPN  A1c 8.3 Insulin requirements in the past 24 hours: 14 u (Novolog) Lytes:  Na 139-->133 --> 131==> weight is stable, his UOP is good. No edema. mild hyperglycemia. He is getting some extra volume from his antibiotics  All other electrolytes wnl Renal: Scr 2.14--->1.10 Pulm:  Cards:  Hepatobil: Neuro: ID:WBC 3.8 > 11.5 ---> 22.8--> 15.4-->18.4 11/12 Aspiration PNA w/ Psudomonas in tracheal aspirate  Cefepime 11/12>> Azithromycin 11/12>> Metronidazole 11/12>>11/16 TPN Access:  PICC line placement 11/9  TPN start date: 03/30/2019 Nutritional Goals (per RD recommendation on 03/30/2019): Cal: 1863 Protein: 96 g Fluid: 1.6-1.8 L Goal TPN rate is 83 ml/hr (Provides 1863 calories and 96 grams of protein)  Current Nutrition:   Plan:   Reduce Clinimix E 5/15 TPN to 70 mL/hr in attempt to counter hyponatremia trend  Continue 20% lipid emulsion 234ml daily @ 20 mL/hr x 12 hrs   This TPN provides at goal 384 kcal of protein and 979.2 kcal of dextrose which provides 1363.2 kCals per day, meeting 90% of patient needs  Electrolytes in TPN: No additional  Add MVI daily, trace elements daily, thiamine [id number] mg daily, folic acid 1 mg, 14 units  of regular insulin to TPN  Continue Q4H moderate/NPO SSI and adjust as needed  -Monitor TPN labs:   Albumin Monday and Thursday  BMP, Mag, Phosphorus Monday and Thursday  Liver function weekly  TG weekly   -F/U: Electrolytes with AM labs and monitor BG   Dallie Piles, PharmD 04/07/2019,7:57 AM

## 2019-04-07 NOTE — Progress Notes (Signed)
RT to patient bedside for NTS per RN request. NTS attempted but was not able to perform due to NG tube. Patient tolerated well but complains of nose soreness at NG site and request not to be NTS in the future. Will continue to monitor.

## 2019-04-07 NOTE — Progress Notes (Signed)
[facility] Day(s): [place] op day(s): 8 Days Post-Op.   Interval History:  Patient seen and examined no acute events or new complaints overnight.  Patient reports he continues to do okay, abdominal soreness at incision, still distended No fever, chills, nausea, or emesis Leukocytosis to 18K Renal function baseline, U/O 1.9 L  NGT with 700 ccs out in last 24 hours He does report some flatus this morning, no BM Remains NPO on TPN.   Vital signs in last 24 hours: [min-max] current  Temp:  [97.9 F (36.6 C)-98.2 F (36.8 C)] 98.2 F (36.8 C) (11/17 0517) Pulse Rate:  [57-99] 94 (11/17 0517) Resp:  [18-20] 18 (11/17 0517) BP: (132-147)/(58-78) 135/58 (11/17 0517) SpO2:  [93 %-98 %] 95 % (11/17 0517) Weight:  [64 kg] 64 kg (11/17 0500)     Height: 5\' 1"  (154.9 cm) Weight: 64 kg BMI (Calculated): 26.67   Intake/Output last 2 shifts:  11/16 0701 - 11/17 0700 In: 956.1 [I.V.:803.3; NG/GT:90; IV Piggyback:62.9] Out: 2650 [Urine:1950; Emesis/NG output:700]   Physical Exam:  Constitutional: alert, cooperative and no distress HEENT: NGT in place-flushing easily Respiratory: breathing non-labored at rest, rhonchorous breath soundsimproving Cardiovascular:tachycardicand sinus rhythm  Gastrointestinal: soft,incisional soreness, continued distension, no rebound/guarding.No peritonitis Integumentary:Laparotomy incision is CDI with staples, no erythema or drainage Genitourinary: Foley in place   Labs:  CBC Latest Ref Rng & Units 04/07/2019 04/06/2019 04/04/2019  WBC 4.0 - 10.5 K/uL 18.4(H) 15.4(H) 17.0(H)  Hemoglobin 13.0 - 17.0 g/dL 9.8(L) 10.3(L) 9.3(L)  Hematocrit 39.0 - 52.0 % 29.9(L) 30.4(L) 29.0(L)  Platelets 150 - 400 K/uL 281 243 184   CMP Latest Ref Rng & Units 04/07/2019 04/06/2019 04/05/2019  Glucose 70 - 99 mg/dL 196(H) 157(H) 155(H)  BUN 8 - 23 mg/dL 38(H) 36(H) 31(H)  Creatinine 0.61 - 1.24 mg/dL 1.10  1.13 0.89  Sodium 135 - 145 mmol/L 131(L) 134(L) 134(L)  Potassium 3.5 - 5.1 mmol/L 4.6 4.5 3.8  Chloride 98 - 111 mmol/L 97(L) 95(L) 99  CO2 22 - 32 mmol/L 29 31 30   Calcium 8.9 - 10.3 mg/dL 8.0(L) 8.0(L) 7.2(L)  Total Protein 6.5 - 8.1 g/dL - 5.0(L) -  Total Bilirubin 0.3 - 1.2 mg/dL - 0.4 -  Alkaline Phos 38 - 126 U/L - 55 -  AST 15 - 41 U/L - 15 -  ALT 0 - 44 U/L - 11 -     Imaging studies:   KUB + CXR (04/07/2019) personally reviewed still with dilated loops of bowel, there does appear to be air in the colon, and radiologist interpretation reviewed:  IMPRESSION: 1. Similar findings of lung hyperexpansion and trace bilateral pleural effusions. 2. Similar findings most suggestive of ileus, unchanged.   Assessment/Plan:  85 y.o. male with persistent post-surgical ileus awaiting decreased NGT output and continued return of bowel function 8 Days Post-Op s/p exploratory laparotomy and lysis of adhesionsfor small bowel obstruction, complicated by likely aspiration PNA with cultures growing pseudomonas and now with suspected zoster on started on Valtrex 11/15              - Continue IV Abx (Cefepime >> Day 3) - monitor leukocytosis: worsening  - Discontinue foley today - Continue NGT decompression to LIS; monitor output - await significant bowel function and decreased output - NPO + TPN; monitor nutritional labs - pain control prn (minimize narcotics); antiemetics prn - monitor abdominal examination+/- KUBs - Monitor on-going bowel function - medical management of comorbidities; hold  home medications for now - Mobilization encouraged; PT following - recommending SNF  All of the above findings and recommendations were discussed with the patient, and the medical team, and all of patient's questions were answered to his expressed satisfaction.  -- Edison Simon, PA-C [facility] 04/07/2019, 7:30 AM [phone] M-F: 7am - 4pm

## 2019-04-07 NOTE — Progress Notes (Signed)
Consult PROGRESS NOTE  Micheal [name] [id number] DOB: [birth year] DOA: 03/25/2019 PCP: Gweneth Fritter, MD  HPI/Recap of past 24 hours: Micheal [name] a 85 y.o.malewith Past medical history ofCOPD, CAD, type II DM, GERD, HTN, HLD, active [name]. Patient presented to the ED on 11/5 from home with complaint of abdominal pain, nausea and vomiting, last bowel movement 10/31. CT abdomen showed high-grade small bowel obstruction with transition point in the central lower abdomen potentially due to an adhesion.   He was admitted under general surgery service.  Medical consultation was obtained for comanagement of medical issues. Patient failed conservative management for bowel obstruction. 11/9, underwent exploratory laparotomy with adhesiolysis.  Hospital course complicated by worsening clinical picture requiring ICU admission for close monitoring. Also lesions affecting his left buttock and left thigf with concern for shingles. Started on valtrex 1000 mg daily x 7 days.  04/07/19: Patient was seen and examined at his bedside this am. No acute events overnight. Audible rhonchorous sounds noted on exam. RT consulted for suctioning BID.  Assessment/Plan: Principal Problem:   SBO (small bowel obstruction) (HCC) Active Problems:   Type 2 diabetes mellitus, uncontrolled, with renal complications (HCC)   COPD (chronic obstructive pulmonary disease) (HCC)   Gastroesophageal reflux disease without esophagitis   HTN (hypertension)   [name]   Acute renal failure superimposed on stage 3b chronic kidney disease (HCC)   Hyponatremia   Leucocytosis   Sepsis suspect secondary to Pseudomonas pneumonia/aspiration pneumonia Leukocytosis is trending up 18K from 15K Procalcitonin 0.21 on 04/05/19 from  0.4 on 04/04/19 from 1.28 on 04/02/2019 Continue with cefepime and IV azithromycin Sputum culture grew Pseudomonas, continue to follow cultures for sensitivities Blood cultures neg to date C/  to Monitor fever curve and WBC Repeat CBC in the morning  Acute hypoxic respiratory failure suspect secondary to Pseudomonas/aspiration pneumonia Not on oxygen supplementation at baseline  O2 saturation 90% on 1 L Continue continuous pulse oximetry Continue to maintain O2 saturation greater than 92% Continue bronchodilators and frequent suctioning by RT Suctioning per RT  Suspected zoster No zoster vaccination previously Lesion on left buttock and lateral LLE Continue valtrex on 04/05/19 1000 mg daily x 7 days Consult wound care specialist  Post op s/p exploratory laparotomy and lysis of adhesions Management per primary  Resolved AKI  Baseline creatinine appears to be 1.0 with GFR greater than 60 Presented with creatinine 2.1 with GFR of 27 Creatinine 1.09 with GFR greater than 60 on 04/02/2019, back to his baseline Continue to monitor urine output Continue to avoid nephrotoxins Continue daily BMP  Hypovolemic hyponatremia Na+ 131 On TPN  Repeat BMP am  Small bilateral pleural effusions On antibiotic cefepime continue Continue to monitor volume status  Severe protein calorie malnutrition Albumin 2.1 BMI 21 with muscle loss On TPN managed by pharmacy  Resolved hypophosphatemia Phosphorus 1.5, repleted phosp 2.6 on 04/05/19  Right eighth rib fracture, incidentally found Continue close monitoring and address as indicated  T2DM with hyperglycemia-hemoglobin A1c 8.3. -Continue to hold Home meds include glipizide and Metformin.  Continue sliding scale insulin with Accu-Cheks.  Cardiovascular issues: HTN/HLD/CAD -Home meds include carvedilol, lisinopril, aspirin, statin. -Currently all of them on hold.  Continue IV Lopressor as needed with parameters Avoid hydralazine in the setting of tachycardia  COPD -Stable.  Continue bronchodilators  GERD -continue IV PPI  [name] -tobacco cessation counseling at bedside.  BPH -Flomax on hold due to  n.p.o.  Monitor urine output.  Mobility:  Continue to encourage  ambulation DVT prophylaxis: Heparin subcu 3 times daily Code Status:  Code Status: Full Code    .Consultants: TRH  Procedures:  NG tube placement.      Objective: Vitals:   04/07/19 0054 04/07/19 0500 04/07/19 0517 04/07/19 1203  BP: 137/64  (!) 135/58 137/70  Pulse: 99  94 90  Resp:   18 20  Temp:   98.2 F (36.8 C) 97.7 F (36.5 C)  TempSrc:   Oral Oral  SpO2: 98%  95% 98%  Weight:  64 kg    Height:        Intake/Output Summary (Last 24 hours) at 04/07/2019 1439 Last data filed at 04/07/2019 0534 Gross per 24 hour  Intake 447.94 ml  Output 1850 ml  Net -1402.06 ml   Filed Weights   04/05/19 0500 04/06/19 0500 04/07/19 0500  Weight: 65.3 kg 64.4 kg 64 kg    Exam:  . General: 85 y.o. year-old male Frail in NAD A&O x 4 . Cardiovascular: RRR no rubs or gallops . Respiratory:Rhonchorous sounds upper airway . Abdomen:Distended hypoactive bowel sounds . Musculoskeletal:No LE edema Psychiatry: Mood is appropriate  Data Reviewed: CBC: Recent Labs  Lab 04/02/19 1844 04/03/19 0516 04/04/19 0359 04/06/19 0652 04/07/19 0529  WBC 22.5* 22.8* 17.0* 15.4* 18.4*  NEUTROABS  --  18.7* 12.8*  --   --   HGB 9.8* 9.2* 9.3* 10.3* 9.8*  HCT 29.4* 27.0* 29.0* 30.4* 29.9*  MCV 91.9 89.7 93.5 88.1 90.6  PLT 176 173 184 243 [id number]   Basic Metabolic Panel: Recent Labs  Lab 04/02/19 1847 04/03/19 0516 04/04/19 0359 04/05/19 0518 04/06/19 0652 04/07/19 0529  NA  --  133* 134* 134* 134* 131*  K  --  4.2 3.8 3.8 4.5 4.6  CL  --  95* 96* 99 95* 97*  CO2  --  31 32 30 31 29   GLUCOSE  --  209* 168* 155* 157* 196*  BUN  --  35* 33* 31* 36* 38*  CREATININE  --  1.14 1.05 0.89 1.13 1.10  CALCIUM  --  7.8* 7.8* 7.2* 8.0* 8.0*  MG 1.7 1.6* 2.1 1.9 1.9  --   PHOS 3.0 2.3* 2.9 2.6 2.9  --    GFR: Estimated Creatinine Clearance: 40.3 mL/min (by C-G formula based on SCr of 1.1 mg/dL). Liver Function  Tests: Recent Labs  Lab 04/01/19 0507 04/02/19 0441 04/02/19 1844 04/04/19 0359 04/06/19 0652  AST  --  14* 12* 16 15  ALT  --  9 8 10 11   ALKPHOS  --  67 70 63 55  BILITOT  --  0.5 0.3 0.3 0.4  PROT  --  4.8* 4.6* 4.4* 5.0*  ALBUMIN 2.1* 2.0*  2.1* 2.0* 1.7* 1.9*   No results for input(s): LIPASE, AMYLASE in the last 168 hours. No results for input(s): AMMONIA in the last 168 hours. Coagulation Profile: No results for input(s): INR, PROTIME in the last 168 hours. Cardiac Enzymes: No results for input(s): CKTOTAL, CKMB, CKMBINDEX, TROPONINI in the last 168 hours. BNP (last 3 results) No results for input(s): PROBNP in the last 8760 hours. HbA1C: No results for input(s): HGBA1C in the last 72 hours. CBG: Recent Labs  Lab 04/06/19 1957 04/07/19 0049 04/07/19 0512 04/07/19 0818 04/07/19 1200  GLUCAP 191* 163* 191* 178* 165*   Lipid Profile: Recent Labs    04/06/19 0652  TRIG 40   Thyroid Function Tests: No results for input(s): TSH, T4TOTAL, FREET4, T3FREE, THYROIDAB in the last 72  hours. Anemia Panel: No results for input(s): VITAMINB12, FOLATE, FERRITIN, TIBC, IRON, RETICCTPCT in the last 72 hours. Urine analysis:    Component Value Date/Time   COLORURINE YELLOW (A) 04/02/2019 1145   APPEARANCEUR CLEAR (A) 04/02/2019 1145   LABSPEC 1.013 04/02/2019 1145   PHURINE 5.0 04/02/2019 1145   GLUCOSEU 50 (A) 04/02/2019 1145   HGBUR NEGATIVE 04/02/2019 1145   BILIRUBINUR NEGATIVE 04/02/2019 1145   KETONESUR NEGATIVE 04/02/2019 1145   PROTEINUR NEGATIVE 04/02/2019 1145   NITRITE NEGATIVE 04/02/2019 1145   LEUKOCYTESUR NEGATIVE 04/02/2019 1145   Sepsis Labs: @LABRCNTIP (procalcitonin:4,lacticidven:4)  ) Recent Results (from the past 240 hour(s))  CULTURE, BLOOD (ROUTINE X 2) w Reflex to ID Panel     Status: None   Collection Time: 04/02/19  9:36 AM   Specimen: BLOOD  Result Value Ref Range Status   Specimen Description BLOOD LEFT AC  Final   Special Requests    Final    BOTTLES DRAWN AEROBIC AND ANAEROBIC Blood Culture adequate volume   Culture   Final    NO GROWTH 5 DAYS Performed at [facility], [address] [place], [place] [address]    Report Status 04/07/2019 FINAL  Final  CULTURE, BLOOD (ROUTINE X 2) w Reflex to ID Panel     Status: None   Collection Time: 04/02/19  9:45 AM   Specimen: BLOOD  Result Value Ref Range Status   Specimen Description BLOOD LEFT WRIST  Final   Special Requests   Final    BOTTLES DRAWN AEROBIC AND ANAEROBIC Blood Culture adequate volume   Culture   Final    NO GROWTH 5 DAYS Performed at [facility], [address] [place], [place] [address]    Report Status 04/07/2019 FINAL  Final  Culture, respiratory     Status: None   Collection Time: 04/02/19 11:45 AM   Specimen: Tracheal Aspirate  Result Value Ref Range Status   Specimen Description   Final    TRACHEAL ASPIRATE Performed at [facility], [address], [place], [place] [address]    Special Requests   Final    NONE Performed at [facility], [address] [place], [place] [address]    Gram Stain   Final    RARE WBC PRESENT, PREDOMINANTLY PMN MODERATE SQUAMOUS EPITHELIAL CELLS PRESENT ABUNDANT GRAM NEGATIVE RODS RARE YEAST Performed at [facility], [place] [address] [place], [place] [address]    Culture FEW PSEUDOMONAS AERUGINOSA  Final   Report Status 04/05/2019 FINAL  Final   Organism ID, Bacteria PSEUDOMONAS AERUGINOSA  Final      Susceptibility   Pseudomonas aeruginosa - MIC*    CEFTAZIDIME 4 SENSITIVE Sensitive     CIPROFLOXACIN <=0.25 SENSITIVE Sensitive     GENTAMICIN <=1 SENSITIVE Sensitive     IMIPENEM >=16 RESISTANT Resistant     PIP/TAZO 16 SENSITIVE Sensitive     CEFEPIME 4 SENSITIVE Sensitive     * FEW PSEUDOMONAS AERUGINOSA  MRSA PCR Screening     Status: None   Collection Time: 04/02/19  5:48 PM   Specimen: Nasopharyngeal  Result Value Ref Range Status   MRSA by PCR  NEGATIVE NEGATIVE Final    Comment:        The GeneXpert MRSA Assay (FDA approved for NASAL specimens only), is one component of a comprehensive MRSA colonization surveillance program. It is not intended to diagnose MRSA infection nor to guide or monitor treatment for MRSA infections. Performed at [employer]  [facility], [address] [place], [place] [address]       Studies: Dg Abd Acute 2+v W 1v Chest  Result Date: 04/07/2019 CLINICAL DATA:  Ileus and pneumonia EXAM: DG ABDOMEN ACUTE W/ 1V CHEST COMPARISON:  04/06/19 20; 04/03/2019; 04/02/2019; chest CT-04/02/2019 FINDINGS: Grossly unchanged cardiac silhouette and mediastinal contours with atherosclerotic plaque within the thoracic aorta. The lungs remain hyperexpanded with unchanged trace bilateral pleural effusions. No discrete focal airspace opacities. No pneumothorax. Stable position of support apparatus. Redemonstrated diffuse gaseous distention of the large and small bowel, unchanged. No pneumoperitoneum, pneumatosis or portal venous gas Enteric suture line overlies the midline of the lower pelvis. Midline surgical staples. Degenerative change of the lumbar spine is suspected though incompletely evaluated. IMPRESSION: 1. Similar findings of lung hyperexpansion and trace bilateral pleural effusions. 2. Similar findings most suggestive of ileus, unchanged. Electronically Signed   By: Sandi Mariscal M.D.   On: 04/07/2019 08:24    Scheduled Meds: . Chlorhexidine Gluconate Cloth  6 each Topical Daily  . enoxaparin (LOVENOX) injection  40 mg Subcutaneous Q24H  . insulin aspart  0-15 Units Subcutaneous Q4H  . mouth rinse  15 mL Mouth Rinse BID  . metoprolol tartrate  5 mg Intravenous Q6H  . mometasone-formoterol  2 puff Inhalation BID  . nicotine  14 mg Transdermal Daily  . pantoprazole (PROTONIX) IV  40 mg Intravenous QHS  . polyvinyl alcohol  1 drop Both Eyes BID  . valACYclovir  1,000 mg Oral Daily    Continuous Infusions: .  Marland KitchenTPN (CLINIMIX-E) Adult 80 mL/hr at 04/06/19 1722  . Marland KitchenTPN (CLINIMIX-E) Adult    . sodium chloride 10 mL/hr at 04/05/19 1555  . azithromycin 250 mL/hr at 04/06/19 1722  . ceFEPime (MAXIPIME) IV 2 g (04/07/19 1038)  . Fat emulsion       LOS: 13 days     Kayleen Memos, MD [facility] Pager [phone]  If 7PM-7AM, please contact night-coverage [email or web address] Password [facility] 04/07/2019, 2:39 PM

## 2019-04-08 LAB — BASIC METABOLIC PANEL
Anion gap: 18 — ABNORMAL HIGH (ref 5–15)
BUN: 36 mg/dL — ABNORMAL HIGH (ref 8–23)
CO2: 31 mmol/L (ref 22–32)
Calcium: 7.9 mg/dL — ABNORMAL LOW (ref 8.9–10.3)
Chloride: 86 mmol/L — ABNORMAL LOW (ref 98–111)
Creatinine, Ser: 1.28 mg/dL — ABNORMAL HIGH (ref 0.61–1.24)
GFR calc Af Amer: 59 mL/min — ABNORMAL LOW (ref >60)
GFR calc non Af Amer: 51 mL/min — ABNORMAL LOW (ref >60)
Glucose, Bld: 145 mg/dL — ABNORMAL HIGH (ref 70–99)
Potassium: 4.4 mmol/L (ref 3.5–5.1)
Sodium: 135 mmol/L (ref 135–145)

## 2019-04-08 LAB — CBC
HCT: 29.1 % — ABNORMAL LOW (ref 39.0–52.0)
Hemoglobin: 9.5 g/dL — ABNORMAL LOW (ref 13.0–17.0)
MCH: 30 pg (ref 26.0–34.0)
MCHC: 32.6 g/dL (ref 30.0–36.0)
MCV: 91.8 fL (ref 80.0–100.0)
Platelets: 273 10*3/uL (ref 150–400)
RBC: 3.17 MIL/uL — ABNORMAL LOW (ref 4.22–5.81)
RDW: 14.5 % (ref 11.5–15.5)
WBC: 17.8 10*3/uL — ABNORMAL HIGH (ref 4.0–10.5)
nRBC: 0 % (ref 0.0–0.2)

## 2019-04-08 LAB — GLUCOSE, CAPILLARY
Glucose-Capillary: 141 mg/dL — ABNORMAL HIGH (ref 70–99)
Glucose-Capillary: 142 mg/dL — ABNORMAL HIGH (ref 70–99)
Glucose-Capillary: 174 mg/dL — ABNORMAL HIGH (ref 70–99)
Glucose-Capillary: 183 mg/dL — ABNORMAL HIGH (ref 70–99)
Glucose-Capillary: 183 mg/dL — ABNORMAL HIGH (ref 70–99)
Glucose-Capillary: 186 mg/dL — ABNORMAL HIGH (ref 70–99)

## 2019-04-08 MED ORDER — SODIUM CHLORIDE 0.9 % IV SOLN
2.0000 g | Freq: Two times a day (BID) | INTRAVENOUS | Status: DC
  Administered 2019-04-08 – 2019-04-12 (×9): 2 g via INTRAVENOUS
  Filled 2019-04-08 (×11): qty 2

## 2019-04-08 MED ORDER — FAT EMULSION PLANT BASED 20 % IV EMUL
250.0000 mL | INTRAVENOUS | Status: AC
  Administered 2019-04-08: 250 mL via INTRAVENOUS
  Filled 2019-04-08: qty 250

## 2019-04-08 MED ORDER — TRACE MINERALS CR-CU-MN-SE-ZN 10-1000-500-60 MCG/ML IV SOLN
INTRAVENOUS | Status: AC
  Administered 2019-04-08: 18:00:00 via INTRAVENOUS
  Filled 2019-04-08: qty 1680

## 2019-04-08 NOTE — Progress Notes (Signed)
[place] [facility] Day(s): [place] op day(s): 9 Days Post-Op.   Interval History:  Patient seen and examined no acute events or new complaints overnight.  Patient reports he is feeling better this morning. No abdominal pain, distension continues to improve some, no nausea or emesis. He continues to endorse intermittent flatus.  Breathing still with upper airway sounds Labs pending NGT - 100 recorded U/O - 2.1 L + BM recorded on chart   Vital signs in last 24 hours: [min-max] current  Temp:  [97.7 F (36.5 C)-98.3 F (36.8 C)] 98.3 F (36.8 C) (11/18 0433) Pulse Rate:  [76-98] 98 (11/18 0433) Resp:  [20-22] 22 (11/18 0433) BP: (123-137)/(46-70) 123/46 (11/18 0433) SpO2:  [94 %-98 %] 94 % (11/18 0433) Weight:  [61.5 kg] 61.5 kg (11/18 0507)     Height: 5\' 1"  (154.9 cm) Weight: 61.5 kg BMI (Calculated): 25.63   Intake/Output last 2 shifts:  11/17 0701 - 11/18 0700 In: 2293.7 [I.V.:1837.1; IV Piggyback:456.6] Out: 2200 [Urine:2100; Emesis/NG output:100]   Physical Exam:  Constitutional: alert, cooperative and no distress HEENT: NGT in place-flushing easily Respiratory: breathing non-labored at rest, rhonchorous breath sounds present but somewhatimproving Cardiovascular:tachycardicand sinus rhythm  Gastrointestinal: soft,incisional soreness, improvement in distension, no rebound/guarding.No peritonitis Integumentary:Laparotomy incision is CDI with staples, no erythema or drainage   Labs:  CBC Latest Ref Rng & Units 04/07/2019 04/06/2019 04/04/2019  WBC 4.0 - 10.5 K/uL 18.4(H) 15.4(H) 17.0(H)  Hemoglobin 13.0 - 17.0 g/dL 9.8(L) 10.3(L) 9.3(L)  Hematocrit 39.0 - 52.0 % 29.9(L) 30.4(L) 29.0(L)  Platelets 150 - 400 K/uL 281 243 184   CMP Latest Ref Rng & Units 04/07/2019 04/06/2019 04/05/2019  Glucose 70 - 99 mg/dL 196(H) 157(H) 155(H)  BUN 8 - 23 mg/dL 38(H) 36(H) 31(H)  Creatinine 0.61 - 1.24 mg/dL 1.10 1.13 0.89   Sodium 135 - 145 mmol/L 131(L) 134(L) 134(L)  Potassium 3.5 - 5.1 mmol/L 4.6 4.5 3.8  Chloride 98 - 111 mmol/L 97(L) 95(L) 99  CO2 22 - 32 mmol/L 29 31 30   Calcium 8.9 - 10.3 mg/dL 8.0(L) 8.0(L) 7.2(L)  Total Protein 6.5 - 8.1 g/dL - 5.0(L) -  Total Bilirubin 0.3 - 1.2 mg/dL - 0.4 -  Alkaline Phos 38 - 126 U/L - 55 -  AST 15 - 41 U/L - 15 -  ALT 0 - 44 U/L - 11 -     Imaging studies: No new pertinent imaging studies   Assessment/Plan:  85 y.o. male with endorse bowel movement and decreased NGT output 9 Days Post-Op s/p exploratory laparotomy and lysis of adhesionsfor small bowel obstruction, complicated by likely aspiration PNA with cultures growing pseudomonas and now with suspected zoster on started on Valtrex 11/15   - Continue IV Abx (Broadened to Zosyn 11/17>>Day 1) - monitor leukocytosis: if worsening may need to involve ID - Will clamp NGT this morning, check residuals 4 hours later. Will likely leave NGT clamped for full 24 hours - NPO + TPN; monitor nutritional labs - pain control prn (minimize narcotics); antiemetics prn - monitor abdominal examination+/- KUBs - Monitor on-going bowel function - medical management of comorbidities; hold home medications for now - Mobilization encouraged; PT following - recommending SNF  All of the above findings and recommendations were discussed with the patient, and the medical team, and all of patient's questions were answered tohisexpressed satisfaction.  -- Edison Simon, PA-C [facility] 04/08/2019, 7:31 AM [phone] M-F: 7am - 4pm

## 2019-04-08 NOTE — Progress Notes (Addendum)
PROGRESS NOTE  Micheal [name] [id number] DOB: 23-Aug-1932 DOA: 03/25/2019 PCP: Gweneth Fritter, MD  Brief History   The patient is an 85 yr old man with a medical history significant for COPD, CAD, DM II, GERD, hypertension, hyperlipidemia, tobacco abuse. He presented to [facility] ED on 03/26/2019 from home with complaint of abdominal pain, nausea, and vomiting. It had been 6 days since his last BM. CT of the abdomen demonstrated a high grade wmall bowel obstruction with transition point in the central lower abdomen potentially due to an adhesion.   The patient was admitted to a general surgery service. The hospitalist service was consulted for comanagement of medical issues. The patient was taken to the OR on 03/30/2019 for exploratory laparotomy with adhesiolysis after conservative measures failed to resolve the problem. The patient's hospital course have been complicated by an ICU admission for worsening clinical picture and singles. In the last 24 hours the patient has developed coarse upper airway sounds. However he has had a BM and return of bowel function. NGT has been clampled. The patient remains on TPN.  Consultants  . [facility]  Procedures  Exploratory laparotomy with adhesiolysis on 03/30/2019  Antibiotics   Anti-infectives (From admission, onward)   Start     Dose/Rate Route Frequency Ordered Stop   04/08/19 1000  ceFEPIme (MAXIPIME) 2 g in sodium chloride 0.9 % 100 mL IVPB     2 g 200 mL/hr over 30 Minutes Intravenous Every 12 hours 04/08/19 0916     04/07/19 1700  piperacillin-tazobactam (ZOSYN) IVPB 3.375 g  Status:  Discontinued     3.375 g 12.5 mL/hr over 240 Minutes Intravenous Every 8 hours 04/07/19 1520 04/08/19 0847   04/05/19 1730  valACYclovir (VALTREX) tablet 1,000 mg     1,000 mg Oral Daily 04/05/19 1724 04/12/19 0959   04/05/19 1715  valACYclovir (VALTREX) tablet 1,000 mg  Status:  Discontinued     1,000 mg Oral Daily 04/05/19 1714 04/05/19 1724    04/03/19 2200  metroNIDAZOLE (FLAGYL) IVPB 500 mg  Status:  Discontinued     500 mg 100 mL/hr over 60 Minutes Intravenous Every 8 hours 04/03/19 1234 04/06/19 1155   04/03/19 1800  azithromycin (ZITHROMAX) 500 mg in sodium chloride 0.9 % 250 mL IVPB  Status:  Discontinued     500 mg 250 mL/hr over 60 Minutes Intravenous Every 24 hours 04/03/19 1234 04/07/19 1520   04/03/19 1400  ceFEPIme (MAXIPIME) 2 g in sodium chloride 0.9 % 100 mL IVPB  Status:  Discontinued     2 g 200 mL/hr over 30 Minutes Intravenous Every 12 hours 04/03/19 1234 04/07/19 1520   04/02/19 0900  Ampicillin-Sulbactam (UNASYN) 3 g in sodium chloride 0.9 % 100 mL IVPB  Status:  Discontinued     3 g 200 mL/hr over 30 Minutes Intravenous Every 6 hours 04/02/19 0756 04/03/19 1234   03/30/19 1600  ertapenem (INVANZ) 1,000 mg in sodium chloride 0.9 % 100 mL IVPB     1 g 200 mL/hr over 30 Minutes Intravenous On call to O.R. 03/30/19 1558 03/30/19 1642    .  Subjective  The patient is resting comfortably. No new complaints.   Objective   Vitals:  Vitals:   04/08/19 0433 04/08/19 1130  BP: (!) 123/46 136/65  Pulse: 98 97  Resp: (!) 22 19  Temp: 98.3 F (36.8 C) 98.2 F (36.8 C)  SpO2: 94% 92%   Exam:  Constitutional:  . The patient is awake, alert, and  oriented x 3. No acute distress. Respiratory:  . No increased work of breathing. . No wheezes, rales, or rhonchi . No tactile fremitus Cardiovascular:  . Regular rate and rhythm . No murmurs, ectopy, or gallups. . No lateral PMI. No thrills. Abdomen:  . Abdomen is soft, non-tender, non-distended . No hernias, masses, or organomegaly . Normoactive bowel sounds.  . Surgical site is clean and dry. Marland Kitchen NGT is clamped. Musculoskeletal:  . No cyanosis, clubbing, or edema Skin:  . No rashes, lesions, ulcers . palpation of skin: no induration or nodules Neurologic:  . CN 2-12 intact . Sensation all 4 extremities intact Psychiatric:  . Mental status o Mood,  affect appropriate o Orientation to person, place, time  . judgment and insight appear intact  I have personally reviewed the following:   Today's Data  . Vitals, I's and O's, BMP, CBC  Micro Data  . Pulmonary aspirate is positive for pseudomonas aeruginosa.d  Imaging  . Abdominal film 04/07/2019 . CXR 04/02/2019: Progressive bibasilar infiltrates. And fractured 9th rib.  Scheduled Meds: . Chlorhexidine Gluconate Cloth  6 each Topical Daily  . enoxaparin (LOVENOX) injection  40 mg Subcutaneous Q24H  . insulin aspart  0-15 Units Subcutaneous Q4H  . mouth rinse  15 mL Mouth Rinse BID  . metoprolol tartrate  5 mg Intravenous Q6H  . mometasone-formoterol  2 puff Inhalation BID  . nicotine  14 mg Transdermal Daily  . pantoprazole (PROTONIX) IV  40 mg Intravenous QHS  . polyvinyl alcohol  1 drop Both Eyes BID  . valACYclovir  1,000 mg Oral Daily   Continuous Infusions: . Marland KitchenTPN (CLINIMIX-E) Adult 70 mL/hr at 04/07/19 1807  . Marland KitchenTPN (CLINIMIX-E) Adult    . sodium chloride 10 mL/hr at 04/05/19 1555  . ceFEPime (MAXIPIME) IV 2 g (04/08/19 1234)  . Fat emulsion      Principal Problem:   SBO (small bowel obstruction) (HCC) Active Problems:   Type 2 diabetes mellitus, uncontrolled, with renal complications (HCC)   COPD (chronic obstructive pulmonary disease) (HCC)   Gastroesophageal reflux disease without esophagitis   HTN (hypertension)   Smoker   Acute renal failure superimposed on stage 3b chronic kidney disease (HCC)   Hyponatremia   Leucocytosis   LOS: 14 days   A & P  Sepsis suspect secondary to Pseudomonas pneumonia/aspiration pneumonia:  Leukocytosis is trending up 18.4K from 15K. 17.8 today. Procalcitonin 0.21 on 04/05/19 from  0.4 on 04/04/19 from 1.28 on 04/02/2019. Continue with cefepime 2 gm Q 12 hours. IV azithromycin stopped after 04/06/2019.  Sputum culture grew Pseudomonas, continue to follow cultures for sensitivities. Sensitivity to Cefepime. Blood cultures have  had no growth to date. C/ to Monitor fever curve and WBC. Patient is afebrile. Monitor leukocytosis.  Acute hypoxic respiratory failure suspect secondary to Pseudomonas/aspiration pneumonia: Not on oxygen supplementation at baseline. He is currently saturating 92% on room air. Will discontinuoud continuous pulse oximetry. Continue bronchodilators. Patient refuses any further suctioning per RT. He continues to have coarse upper airway sounds.  Suspected zoster: Left buttock and lateral left lower extremity. The patient is receiving valtrex. Wound care following. No history of zoster vaccination.  Post op s/p exploratory laparotomy and lysis of adhesions: Management per primary service.  AKI: Resolved. The patient's baseline creatinine appears to be 1.0 with GFR greater than 60. He presented with creatinine 2.1 with GFR of 27. Today's creatinine is 1.10. Monitor creatinine, volume status, and electrolytes. Avoid nephrotoxins and hypotension.   Hypovolemic hyponatremia: Na+ 13.  Managed with TPN. Follow chemistry.  Small bilateral pleural effusions: On antibiotic cefepime continue. Follow CXR and volume status.  Severe protein calorie malnutrition: On TPN managed by pharmacy. Albumin of 2.1 with BMI of 21 with muscle loss. Monitor.  Hypophosphatemia: resolved.  Right eighth rib fracture, incidentally found: Continue close monitoring and address as indicated.  DM II: Hemoglobin A1c 8.3. Glucoses are followed with FSBS and SSI. FSBS over the past 24 hours have been 142 - 183. Continue to hold Home meds include glipizide and Metformin.   Essential hypertension: Blood pressures are well controlled with metoprolol only now. Monitor and add back lisinopril iff needed.   Hyperlipidemia: Continue statin as at home.  CAD: Continue Stating, ASA, and metoprolol.   COPD: Noted and stable. Continue bronchodilators.  GERD:  Continue IV PPI  Current daily smoker: Tobacco cessation  counseling at bedside. Nicotine patch is available.  BPH: Flomax on hold due to n.p.o.  Monitor urine output.  I have seen and examined this patient myself. I have spent 24 minutes in his evaluation and care.  DVT prophylaxis:Heparin subcu 3 times daily Code Status:Code Status: Full Code Family communication: Wife at bedside.  Disposition: TBD.  Dilyn Osoria, DO [facility] Direct contact: see [email or web address]  7PM-7AM contact night coverage as above 04/08/2019, 3:17 PM  LOS: 14 days

## 2019-04-08 NOTE — Progress Notes (Signed)
Physical Therapy Treatment Patient Details Name: Micheal [name] MRN: [id number] DOB: 1932/12/17 Today's Date: 04/08/2019    History of Present Illness Pt is an 85 y.o. male presenting to hospital 11/4 with several days of generalized abdominal discomfort, vomiting, and nausea.  Pt admitted with SBO with NG tube placed. Pt s/p 11/9 exploratory lap and lysis of adhesions.  PMH includes h/o balance problem, COPD, DM, htn, MI, PNA, cardiac cath, ORIF L ankle fx 01/10/18, L distal radius fx 01/10/19, anterolateral R 9th and 10th rib fx's 11/27/18.    PT Comments    Pt presented with deficits in strength, transfers, mobility, gait, balance, and activity tolerance but made good progress towards goals this session.  Pt did not require physical assistance with bed mobility or transfers and increased his amb tolerance to 60'.  Pt's SpO2 and HR were WNL during the session on room air with pt requiring several short therapeutic rest breaks during the session but no adverse symptoms reported other than fatigue.  Pt will benefit from PT services in a SNF setting upon discharge to safely address above deficits for decreased caregiver assistance and eventual return to PLOF.     Follow Up Recommendations  SNF     Equipment Recommendations  Rolling walker with 5" wheels;3in1 (PT)    Recommendations for Other Services       Precautions / Restrictions Precautions Precautions: Fall Precaution Comments: NG tube; NPO; abdominal incision Restrictions Weight Bearing Restrictions: No    Mobility  Bed Mobility Overal bed mobility: Needs Assistance Bed Mobility: Rolling;Supine to Sit Rolling: Supervision Sidelying to sit: Supervision       General bed mobility comments: Min verbal cues for log roll sequencing but no physical assistance required this session  Transfers Overall transfer level: Needs assistance Equipment used: Rolling walker (2 wheeled) Transfers: Sit to/from Stand Sit to Stand: Min  guard         General transfer comment: Min verbal cues for sequencing  Ambulation/Gait Ambulation/Gait assistance: Min guard Gait Distance (Feet): 60 Feet Assistive device: Rolling walker (2 wheeled) Gait Pattern/deviations: Step-through pattern;Decreased step length - right;Decreased step length - left;Trunk flexed Gait velocity: significantly decreased   General Gait Details: Slow cadence with short B step length but steady without LOB   Stairs             Wheelchair Mobility    Modified Rankin (Stroke Patients Only)       Balance Overall balance assessment: Needs assistance Sitting-balance support: Feet supported;Single extremity supported Sitting balance-Leahy Scale: Fair     Standing balance support: During functional activity;Bilateral upper extremity supported Standing balance-Leahy Scale: Fair Standing balance comment: pt requiring Mod BUE lean on RW for balance during ambulation                            Cognition Arousal/Alertness: Awake/alert Behavior During Therapy: WFL for tasks assessed/performed Overall Cognitive Status: Within Functional Limits for tasks assessed                                        Exercises Total Joint Exercises Ankle Circles/Pumps: Strengthening;Both;10 reps;15 reps Quad Sets: Strengthening;Both;10 reps;15 reps Gluteal Sets: Strengthening;Both;10 reps;15 reps Towel Squeeze: Strengthening;Both;10 reps Heel Slides: AROM;Both;5 reps Hip ABduction/ADduction: AROM;Both;10 reps Long Arc Quad: AROM;Both;10 reps;Strengthening;15 reps Knee Flexion: AROM;Strengthening;Both;10 reps;15 reps Marching in Standing: AROM;Both;10 reps;Standing Other Exercises  Other Exercises: Log roll technique training Other Exercises: HEP education for BLE APs, QS, and GS x 10 each every 1-2 hours daily    General Comments        Pertinent Vitals/Pain Pain Assessment: No/denies pain    Home Living                       Prior Function            PT Goals (current goals can now be found in the care plan section) Progress towards PT goals: Progressing toward goals    Frequency    Min 2X/week      PT Plan Current plan remains appropriate    Co-evaluation              AM-PAC PT "6 Clicks" Mobility   Outcome Measure  Help needed turning from your back to your side while in a flat bed without using bedrails?: A Little Help needed moving from lying on your back to sitting on the side of a flat bed without using bedrails?: A Little Help needed moving to and from a bed to a chair (including a wheelchair)?: A Little Help needed standing up from a chair using your arms (e.g., wheelchair or bedside chair)?: A Little Help needed to walk in hospital room?: A Little Help needed climbing 3-5 steps with a railing? : A Lot 6 Click Score: 17    End of Session Equipment Utilized During Treatment: Gait belt;Other (comment)(Under arms to avoid abdominal incision) Activity Tolerance: Patient tolerated treatment well Patient left: in chair;with call bell/phone within reach;with chair alarm set Nurse Communication: Mobility status PT Visit Diagnosis: Difficulty in walking, not elsewhere classified (R26.2);Muscle weakness (generalized) (M62.81)     Time: [id number] PT Time Calculation (min) (ACUTE ONLY): 41 min  Charges:  $Gait Training: 8-22 mins $Therapeutic Exercise: 8-22 mins $Therapeutic Activity: 8-22 mins                     D. Scott Vinia Jemmott PT, DPT 04/08/19, 1:24 PM

## 2019-04-08 NOTE — Consult Note (Signed)
Pharmacy Antibiotic Note  Micheal [name] is a 73 YOM admitted on 03/25/2019 with complaints of abdominal pain, nausea, vomiting, and constipation. CT abdomen was indicative of SBO. PMH includes: COPD, CAD, T2DM, GERD, HTN, HLD, and active smoker. On 11/12 the patient was examined for worsening rhonchorous sounds and increasing O2 demand overnight. CXR and leukocytosis was suggestive of aspiration PNA. On 11/13 his respiratory culture grew out Pseudomonas aeruginosa. On 11/17 his WBC increased and he was noted to have worsening respiratory sounds and cefepime was changed to Zosyn. Today his WBC today has decreased slightly, Dr Benny Lennert would like to restart cefepime  Plan:  Restart cefepime 2g q12h (due to CrCl 30-60 mL/min)  Monitor renal function and adjust as needed.  Height: 5\' 1"  (154.9 cm) Weight: 135 lb 9.3 oz (61.5 kg) IBW/kg (Calculated) : 52.3  Temp (24hrs), Avg:98 F (36.7 C), Min:97.7 F (36.5 C), Max:98.3 F (36.8 C)  Recent Labs  Lab 04/02/19 0547  04/03/19 0516 04/04/19 0359 04/05/19 0518 04/06/19 0652 04/07/19 0529 04/08/19 0914  WBC  --    < > 22.8* 17.0*  --  15.4* 18.4* 17.8*  CREATININE  --    < > 1.14 1.05 0.89 1.13 1.10 1.28*  LATICACIDVEN 1.8  --   --   --   --   --   --   --    < > = values in this interval not displayed.    Estimated Creatinine Clearance: 31.8 mL/min (A) (by C-G formula based on SCr of 1.28 mg/dL (H)).    No Known Allergies  Antimicrobials this admission: Ertapenem 11/9 x 1  Unasyn 11/12 >> 11/13 Azithromycin 11/13 >> 11/17 Metronidazole 11/13 >> 11/16 Zosyn 11/17 >> 11/18 Cefepime 11/13 >> 11/17  11/18 >>  Microbiology results: 11/12 Sputum: P aeruginosa 11/12 Blood Cx: NG  11/12 SARS Co-V-2: negative   Thank you for allowing pharmacy to be a part of this patient's care.  Dallie Piles, PharmD  04/08/2019 10:45 AM

## 2019-04-08 NOTE — Consult Note (Signed)
[place] Nurse Consult Note: Reason for Consult: lesions? Shingles  Wound type: small bullous type area x 3 not clustered.  Two area on the left flank; one on the left lateral thigh Pressure Injury POA: NA Measurement: each with raised serous filled blisters, dark purplish/red skin each not larger than 1cm x 1cm x 0cm  Wound bed:see above Drainage (amount, consistency, odor) none Periwound: intact  Dressing procedure/placement/frequency: No topical tx recommended for intact lesions; leave open to air.   Discussed POC with patient and bedside nurse.  Re consult if needed, will not follow at this time. Thanks  Abriel Hattery [employer], RN,CWOCN, CNS, [place] [phone])

## 2019-04-08 NOTE — Consult Note (Signed)
PHARMACY - ADULT TOTAL PARENTERAL NUTRITION CONSULT NOTE   Pharmacy Consult for TPN Indication: Bowel obstruction  Patient Measurements: Height: 5\' 1"  (154.9 cm) Weight: 135 lb 9.3 oz (61.5 kg) IBW/kg (Calculated) : 52.3 TPN AdjBW (KG): 55.6 Body mass index is 25.62 kg/m.   Assessment:  Micheal [name] a 85 y.o.malewith Past medical history ofCOPD, CAD, type II DM, GERD, HTN, HLD, active smoker. Patient will likely have surgery today, d/t SBO and has been NPO. He is noted to be clinically improved this morning with reports of flatus 9 Days Post-Op s/pexploratory laparotomy and lysis of adhesionsfor small bowel obstruction.  Anticoagulant: LMWH Hgb 12.2-->9.2 --> 9.5 PLT wnl GI: PPI IV -LBM 11/16 Endo: moderate SSI Q4H, insulin in TPN  A1c 8.3 Insulin requirements in the past 24 hours: 17 u (Novolog) Lytes:  Hyponatremia corrected w/ decreased volume  All other electrolytes wnl Renal: Scr 2.14--->1.10-->1.28 Pulm:  Cards:  Hepatobil: Neuro: ID:WBC 3.8 > 11.5 ---> 22.8--> 15.4-->18.4>17.8 11/12 Aspiration PNA w/ Psudomonas in tracheal aspirate  Cefepime 11/12>> Azithromycin 11/12>>11/17 Metronidazole 11/12>>11/16 TPN Access:  PICC line placement 11/9  TPN start date: 03/30/2019 Nutritional Goals (per RD recommendation on 03/30/2019): Cal: 1863 Protein: 96 g Fluid: 1.6-1.8 L Goal TPN rate is 83 ml/hr (Provides 1863 calories and 96 grams of protein)  Current Nutrition:   Plan:   Continue Clinimix E 5/15 TPN to 70 mL/hr: hyponatremia corrected; continue at reduced rate   Continue 20% lipid emulsion 242ml daily @ 20 mL/hr x 12 hrs   This TPN provides (at goal) 384 kcal of protein and 979.2 kcal of dextrose which provides 1363.2 kCals per day, meeting 90% of patient needs  Electrolytes in TPN: No additional  Add MVI daily, trace elements daily, thiamine 123XX123 mg daily, folic acid 1 mg, 17 units of regular insulin to TPN (increased from 14 units daily  11/18)  Continue Q4H moderate/NPO SSI and adjust as needed  -Monitor TPN labs:   Albumin Monday and Thursday  BMP, Mag, Phosphorus Monday and Thursday  Liver function weekly  TG weekly   -F/U: Electrolytes with AM labs and monitor BG   Dallie Piles, PharmD 04/08/2019,6:57 AM

## 2019-04-08 NOTE — Consult Note (Signed)
I have placed a request via Secure Chat to Dr. Hall requesting photos of the wound areas of concern to be placed in the EMR.    Jacolby Risby MSN,RN,CWOCN, CNS, CWON-AP [phone] 

## 2019-04-09 LAB — CBC
HCT: 28 % — ABNORMAL LOW (ref 39.0–52.0)
Hemoglobin: 9.4 g/dL — ABNORMAL LOW (ref 13.0–17.0)
MCH: 29.9 pg (ref 26.0–34.0)
MCHC: 33.6 g/dL (ref 30.0–36.0)
MCV: 89.2 fL (ref 80.0–100.0)
Platelets: 301 10*3/uL (ref 150–400)
RBC: 3.14 MIL/uL — ABNORMAL LOW (ref 4.22–5.81)
RDW: 14.6 % (ref 11.5–15.5)
WBC: 17.5 10*3/uL — ABNORMAL HIGH (ref 4.0–10.5)
nRBC: 0 % (ref 0.0–0.2)

## 2019-04-09 LAB — BASIC METABOLIC PANEL
Anion gap: 9 (ref 5–15)
BUN: 37 mg/dL — ABNORMAL HIGH (ref 8–23)
CO2: 29 mmol/L (ref 22–32)
Calcium: 8.4 mg/dL — ABNORMAL LOW (ref 8.9–10.3)
Chloride: 97 mmol/L — ABNORMAL LOW (ref 98–111)
Creatinine, Ser: 1.37 mg/dL — ABNORMAL HIGH (ref 0.61–1.24)
GFR calc Af Amer: 55 mL/min — ABNORMAL LOW (ref >60)
GFR calc non Af Amer: 47 mL/min — ABNORMAL LOW (ref >60)
Glucose, Bld: 195 mg/dL — ABNORMAL HIGH (ref 70–99)
Potassium: 4.5 mmol/L (ref 3.5–5.1)
Sodium: 135 mmol/L (ref 135–145)

## 2019-04-09 LAB — MAGNESIUM: Magnesium: 1.9 mg/dL (ref 1.7–2.4)

## 2019-04-09 LAB — ALBUMIN: Albumin: 2.1 g/dL — ABNORMAL LOW (ref 3.5–5.0)

## 2019-04-09 LAB — GLUCOSE, CAPILLARY
Glucose-Capillary: 115 mg/dL — ABNORMAL HIGH (ref 70–99)
Glucose-Capillary: 157 mg/dL — ABNORMAL HIGH (ref 70–99)
Glucose-Capillary: 202 mg/dL — ABNORMAL HIGH (ref 70–99)
Glucose-Capillary: 286 mg/dL — ABNORMAL HIGH (ref 70–99)
Glucose-Capillary: 70 mg/dL (ref 70–99)
Glucose-Capillary: 86 mg/dL (ref 70–99)

## 2019-04-09 LAB — PHOSPHORUS: Phosphorus: 3.1 mg/dL (ref 2.5–4.6)

## 2019-04-09 MED ORDER — INSULIN GLARGINE 100 UNIT/ML ~~LOC~~ SOLN
5.0000 [IU] | Freq: Once | SUBCUTANEOUS | Status: AC
  Administered 2019-04-09: 14:00:00 5 [IU] via SUBCUTANEOUS
  Filled 2019-04-09: qty 0.05

## 2019-04-09 MED ORDER — TAMSULOSIN HCL 0.4 MG PO CAPS
0.4000 mg | ORAL_CAPSULE | Freq: Every day | ORAL | Status: DC
  Administered 2019-04-09 – 2019-04-15 (×7): 0.4 mg via ORAL
  Filled 2019-04-09 (×7): qty 1

## 2019-04-09 MED ORDER — FAT EMULSION PLANT BASED 20 % IV EMUL
250.0000 mL | INTRAVENOUS | Status: AC
  Administered 2019-04-09: 250 mL via INTRAVENOUS
  Filled 2019-04-09: qty 250

## 2019-04-09 MED ORDER — TRACE MINERALS CR-CU-MN-SE-ZN 10-1000-500-60 MCG/ML IV SOLN
INTRAVENOUS | Status: AC
  Administered 2019-04-09: 17:00:00 via INTRAVENOUS
  Filled 2019-04-09: qty 1680

## 2019-04-09 NOTE — Consult Note (Signed)
PHARMACY - ADULT TOTAL PARENTERAL NUTRITION CONSULT NOTE   Pharmacy Consult for TPN Indication: Bowel obstruction  Patient Measurements: Height: 5\' 1"  (154.9 cm) Weight: 132 lb 15 oz (60.3 kg) IBW/kg (Calculated) : 52.3 TPN AdjBW (KG): 55.6 Body mass index is 25.12 kg/m.   Assessment:  Micheal [name] a 85 y.o.malewith Past medical history ofCOPD, CAD, type II DM, GERD, HTN, HLD, active smoker. Patient will likely have surgery today, d/t SBO and has been NPO. He is noted to be clinically improved this morning with reports of flatus 9 Days Post-Op s/pexploratory laparotomy and lysis of adhesionsfor small bowel obstruction.  Anticoagulant: LMWH Hgb 12.2-->9.2 --> 9.5> 9.4 PLT wnl GI: PPI IV -LBM 11/16 Endo: moderate SSI Q4H, insulin in TPN  A1c 8.3 -SSI Insulin requirements in the past 24 hours: 15 u (Novolog) -Insulin in TPN: 17 units (regular) Lytes:  Hyponatremia corrected w/ decreased volume  All other electrolytes wnl Renal: Scr 2.14--->1.10-->1.28> 1.37 Pulm:  Cards:  Hepatobil: Neuro: ID:WBC 3.8 > 11.5 ---> 22.8--> 15.4-->18.4>17.8> 17.5 11/12 Aspiration PNA w/ Psudomonas in tracheal aspirate  Cefepime 11/12>> Azithromycin 11/12>>11/17 Metronidazole 11/12>>11/16  TPN Access:  PICC line placement 11/9  TPN start date: 03/30/2019 Nutritional Goals (per RD recommendation on 03/30/2019): Cal: 1863 Protein: 96 g Fluid: 1.6-1.8 L Goal TPN rate is 83 ml/hr (Provides 1863 calories and 96 grams of protein)  Current Nutrition:   Plan:   Continue Clinimix E 5/15 TPN at 70 mL/hr: hyponatremia corrected; continue at reduced rate   Continue 20% lipid emulsion 293ml daily @ 20 mL/hr x 12 hrs   This TPN provides (at goal) 384 kcal of protein and 979.2 kcal of dextrose which provides 1363.2 kCals per day, meeting 90% of patient needs  Electrolytes in TPN: No additional  Add MVI daily, trace elements daily, thiamine [id number] mg daily, folic acid 1 mg, hx  ETOH  Insulin: continue 17 units of regular insulin to TPN and will add a dose of Lantus 5 units x 1 (got 15 units SSI in addition to 17 units insulin in TPN: total 32 units)  Continue Q4H moderate/NPO SSI and adjust as needed. To start sips/ice ships today 11/19  -Monitor TPN labs:   Albumin Monday and Thursday  BMP, Mag, Phosphorus Monday and Thursday  Liver function weekly  TG weekly   -F/U: Electrolytes with AM labs and monitor BG   Avraham Benish A, PharmD 04/09/2019,11:57 AM

## 2019-04-09 NOTE — Progress Notes (Addendum)
Per MD okay for RN to remove NG tube. And also to modified diet order so pt can have ice chips. Will continue to assess and monitor pt.

## 2019-04-09 NOTE — Care Management (Signed)
RNCM spoke with wife via phone.  She request that I meet with her tomorrow to discuss recommendation for SNF. RNCM to follow up

## 2019-04-09 NOTE — Progress Notes (Signed)
PROGRESS NOTE  Micheal [name] [id number] DOB: 02/22/1933 DOA: 03/25/2019 PCP: Gweneth Fritter, MD  Brief History   The patient is an 85 yr old man with a medical history significant for COPD, CAD, DM II, GERD, hypertension, hyperlipidemia, tobacco abuse. He presented to [facility] ED on 03/26/2019 from home with complaint of abdominal pain, nausea, and vomiting. It had been 6 days since his last BM. CT of the abdomen demonstrated a high grade wmall bowel obstruction with transition point in the central lower abdomen potentially due to an adhesion.   The patient was admitted to a general surgery service. The hospitalist service was consulted for comanagement of medical issues. The patient was taken to the OR on 03/30/2019 for exploratory laparotomy with adhesiolysis after conservative measures failed to resolve the problem. The patient's hospital course have been complicated by an ICU admission for worsening clinical picture and singles. In the last 24 hours the patient has developed coarse upper airway sounds. However he has had a BM and return of bowel function. NGT has been removed. The patient may have ice chips today.  The patient remains on TPN.  Consultants  . [facility]  Procedures  Exploratory laparotomy with adhesiolysis on 03/30/2019  Antibiotics   Anti-infectives (From admission, onward)   Start     Dose/Rate Route Frequency Ordered Stop   04/08/19 1000  ceFEPIme (MAXIPIME) 2 g in sodium chloride 0.9 % 100 mL IVPB     2 g 200 mL/hr over 30 Minutes Intravenous Every 12 hours 04/08/19 0916     04/07/19 1700  piperacillin-tazobactam (ZOSYN) IVPB 3.375 g  Status:  Discontinued     3.375 g 12.5 mL/hr over 240 Minutes Intravenous Every 8 hours 04/07/19 1520 04/08/19 0847   04/05/19 1730  valACYclovir (VALTREX) tablet 1,000 mg     1,000 mg Oral Daily 04/05/19 1724 04/12/19 0959   04/05/19 1715  valACYclovir (VALTREX) tablet 1,000 mg  Status:  Discontinued     1,000 mg Oral Daily  04/05/19 1714 04/05/19 1724   04/03/19 2200  metroNIDAZOLE (FLAGYL) IVPB 500 mg  Status:  Discontinued     500 mg 100 mL/hr over 60 Minutes Intravenous Every 8 hours 04/03/19 1234 04/06/19 1155   04/03/19 1800  azithromycin (ZITHROMAX) 500 mg in sodium chloride 0.9 % 250 mL IVPB  Status:  Discontinued     500 mg 250 mL/hr over 60 Minutes Intravenous Every 24 hours 04/03/19 1234 04/07/19 1520   04/03/19 1400  ceFEPIme (MAXIPIME) 2 g in sodium chloride 0.9 % 100 mL IVPB  Status:  Discontinued     2 g 200 mL/hr over 30 Minutes Intravenous Every 12 hours 04/03/19 1234 04/07/19 1520   04/02/19 0900  Ampicillin-Sulbactam (UNASYN) 3 g in sodium chloride 0.9 % 100 mL IVPB  Status:  Discontinued     3 g 200 mL/hr over 30 Minutes Intravenous Every 6 hours 04/02/19 0756 04/03/19 1234   03/30/19 1600  ertapenem (INVANZ) 1,000 mg in sodium chloride 0.9 % 100 mL IVPB     1 g 200 mL/hr over 30 Minutes Intravenous On call to O.R. 03/30/19 1558 03/30/19 1642     Subjective  The patient is resting comfortably. No new complaints.   Objective   Vitals:  Vitals:   04/09/19 1247 04/09/19 1431  BP: (!) 138/59 129/79  Pulse: 92 79  Resp: 17 16  Temp: 97.6 F (36.4 C)   SpO2: 94% 94%   Exam:  Constitutional:  . The patient is  awake, alert, and oriented x 3. No acute distress. Respiratory:  . No increased work of breathing. . No wheezes, rales, or rhonchi . No tactile fremitus Cardiovascular:  . Regular rate and rhythm . No murmurs, ectopy, or gallups. . No lateral PMI. No thrills. Abdomen:  . Abdomen is soft, non-tender, non-distended . No hernias, masses, or organomegaly . Normoactive bowel sounds.  . Surgical site is clean and dry. Marland Kitchen NGT is clamped. Musculoskeletal:  . No cyanosis, clubbing, or edema Skin:  . No rashes, lesions, ulcers . palpation of skin: no induration or nodules Neurologic:  . CN 2-12 intact . Sensation all 4 extremities intact Psychiatric:  . Mental status o  Mood, affect appropriate o Orientation to person, place, time  . judgment and insight appear intact  I have personally reviewed the following:   Today's Data  . Vitals, I's and O's, BMP, CBC  Micro Data  . Pulmonary aspirate is positive for pseudomonas aeruginosa.  Imaging  . Abdominal film 04/07/2019 . CXR 04/02/2019: Progressive bibasilar infiltrates. And fractured 9th rib.  Scheduled Meds: . Chlorhexidine Gluconate Cloth  6 each Topical Daily  . enoxaparin (LOVENOX) injection  40 mg Subcutaneous Q24H  . insulin aspart  0-15 Units Subcutaneous Q4H  . mouth rinse  15 mL Mouth Rinse BID  . metoprolol tartrate  5 mg Intravenous Q6H  . mometasone-formoterol  2 puff Inhalation BID  . nicotine  14 mg Transdermal Daily  . pantoprazole (PROTONIX) IV  40 mg Intravenous QHS  . polyvinyl alcohol  1 drop Both Eyes BID  . tamsulosin  0.4 mg Oral Daily  . valACYclovir  1,000 mg Oral Daily   Continuous Infusions: . Marland KitchenTPN (CLINIMIX-E) Adult Stopped (04/09/19 0533)  . Marland KitchenTPN (CLINIMIX-E) Adult    . sodium chloride Stopped (04/09/19 1258)  . ceFEPime (MAXIPIME) IV Stopped (04/09/19 0932)  . Fat emulsion      Principal Problem:   SBO (small bowel obstruction) (HCC) Active Problems:   Type 2 diabetes mellitus, uncontrolled, with renal complications (HCC)   COPD (chronic obstructive pulmonary disease) (HCC)   Gastroesophageal reflux disease without esophagitis   HTN (hypertension)   Smoker   Acute renal failure superimposed on stage 3b chronic kidney disease (HCC)   Hyponatremia   Leucocytosis   LOS: 15 days   A & P  Sepsis suspect secondary to Pseudomonas pneumonia/aspiration pneumonia:  Leukocytosis is trending up 18.4K from 15K. 17.5 today. Procalcitonin 0.21 on 04/05/19 from  0.4 on 04/04/19 from 1.28 on 04/02/2019. Continue with cefepime 2 gm Q 12 hours. IV azithromycin stopped after 04/06/2019.  Sputum culture grew Pseudomonas, continue to follow cultures for sensitivities.  Sensitivity to Cefepime. Blood cultures have had no growth to date.Continue to Monitor fever curve and WBC. Patient is afebrile.  Monitor leukocytosis.  Acute hypoxic respiratory failure suspect secondary to Pseudomonas/aspiration pneumonia: Not on oxygen supplementation at baseline. He is currently saturating 94% on room air. Will discontinuoud continuous pulse oximetry. Continue bronchodilators. Patient refuses any further suctioning per RT. He continues to have coarse upper airway sounds.  Suspected zoster: Left buttock and lateral left lower extremity. The patient is receiving valtrex. Wound care following. No history of zoster vaccination.  Post op s/p exploratory laparotomy and lysis of adhesions: Management per primary service. NGT out today. TPN continued. The patient is on ice chips.  AKI: Resolved. The patient's baseline creatinine appears to be 1.0 with GFR greater than 60. He presented with creatinine 2.1 with GFR of 27. Today's creatinine is  1.28. Monitor creatinine, volume status, and electrolytes. Avoid nephrotoxins and hypotension.   Hypovolemic hyponatremia: Na+ 135. Managed with TPN. Follow chemistry.  Small bilateral pleural effusions: On antibiotic cefepime continue. Follow CXR and volume status.  Severe protein calorie malnutrition: On TPN managed by pharmacy. Albumin of 2.1 with BMI of 21 with muscle loss. Monitor.  Hypophosphatemia: resolved.  Right eighth rib fracture, incidentally found: Continue close monitoring and address as indicated.  DM II: Hemoglobin A1c 8.3. Glucoses are followed with FSBS and SSI. FSBS over the past 24 hours have been 86-202. Continue to hold Home meds include glipizide and Metformin.   Essential hypertension: Blood pressures are well controlled with metoprolol only now. Monitor and add back lisinopril iff needed.   Hyperlipidemia: Continue statin as at home.  CAD: Continue Statin, ASA, and metoprolol.   COPD: Noted and  stable. Continue bronchodilators.  GERD:  Continue IV PPI  Current daily smoker: Tobacco cessation counseling at bedside. Nicotine patch is available.  BPH: Flomax on hold due to n.p.o.  Monitor urine output.  I have seen and examined this patient myself. I have spent 34 minutes in his evaluation and care.  DVT prophylaxis:Heparin subcu 3 times daily Code Status:Code Status: Full Code Family communication: Wife at bedside.  Disposition: TBD.  Jerome Otter, DO [facility] Direct contact: see [email or web address]  7PM-7AM contact night coverage as above 04/08/2019, 3:17 PM  LOS: 14 days

## 2019-04-09 NOTE — Progress Notes (Signed)
04/09/2019  Subjective: Patient is 10 Days Post-Op s/p exlap and lysis of adhesions for small bowel obstruction.  No acute events overnight.  He reports his abdomen feels better.  Having flatus but no BM yesterday.   His NG tube was clamped x 2 trials yesterday and left clamped overnight, and he did not have any nausea or worsening pain overnight.  Vital signs: Temp:  [97.8 F (36.6 C)-98.2 F (36.8 C)] 97.9 F (36.6 C) (11/19 0513) Pulse Rate:  [76-97] 83 (11/19 0513) Resp:  [18-20] 18 (11/19 0513) BP: (129-146)/(53-68) 146/68 (11/19 0513) SpO2:  [92 %-96 %] 94 % (11/19 0513) Weight:  [60.3 kg] 60.3 kg (11/19 0500)   Intake/Output: 11/18 0701 - 11/19 0700 In: 2439.7 [I.V.:2239.7; IV Piggyback:200] Out: 3300 [Urine:3300] Last BM Date: 04/07/19  Physical Exam: Constitutional:  No acute distress Abdomen:  Soft, mildly distended, nontender to palpation.  Midline incision is clean, dry, intact, with staples in place.  NG had been clamped and I connected it to suction with only some gas output. GU:  Foley in place with clear yellow urine.  Labs:  Recent Labs    04/08/19 0914 04/09/19 0524  WBC 17.8* 17.5*  HGB 9.5* 9.4*  HCT 29.1* 28.0*  PLT 273 301   Recent Labs    04/08/19 0914 04/09/19 0524  NA 135 135  K 4.4 4.5  CL 86* 97*  CO2 31 29  GLUCOSE 145* 195*  BUN 36* 37*  CREATININE 1.28* 1.37*  CALCIUM 7.9* 8.4*   No results for input(s): LABPROT, INR in the last 72 hours.  Imaging: No results found.  Assessment/Plan: This is a 85 y.o. male s/p exploratory laparotomy and lysis of adhesions.  --NG has been basically clamped the last 24 hours and patient has been asymptomatic.  Will reconnect to suction for 1 hour to check residuals, but if < 150 ml will d/c NG this morning and start him on sips/ice chips today. --Will give another suppository today. --continue TPN --continue IV abx for his aspiration pneumonia.   --Appreciate hospitalist team assistance with  his medical management.   Melvyn Neth, [facility]

## 2019-04-10 LAB — BASIC METABOLIC PANEL
Anion gap: 7 (ref 5–15)
BUN: 38 mg/dL — ABNORMAL HIGH (ref 8–23)
CO2: 29 mmol/L (ref 22–32)
Calcium: 8.3 mg/dL — ABNORMAL LOW (ref 8.9–10.3)
Chloride: 98 mmol/L (ref 98–111)
Creatinine, Ser: 1.32 mg/dL — ABNORMAL HIGH (ref 0.61–1.24)
GFR calc Af Amer: 57 mL/min — ABNORMAL LOW (ref >60)
GFR calc non Af Amer: 49 mL/min — ABNORMAL LOW (ref >60)
Glucose, Bld: 142 mg/dL — ABNORMAL HIGH (ref 70–99)
Potassium: 4.7 mmol/L (ref 3.5–5.1)
Sodium: 134 mmol/L — ABNORMAL LOW (ref 135–145)

## 2019-04-10 LAB — GLUCOSE, CAPILLARY
Glucose-Capillary: 123 mg/dL — ABNORMAL HIGH (ref 70–99)
Glucose-Capillary: 138 mg/dL — ABNORMAL HIGH (ref 70–99)
Glucose-Capillary: 168 mg/dL — ABNORMAL HIGH (ref 70–99)
Glucose-Capillary: 172 mg/dL — ABNORMAL HIGH (ref 70–99)
Glucose-Capillary: 141 mg/dL — ABNORMAL HIGH (ref 70–99)
Glucose-Capillary: 236 mg/dL — ABNORMAL HIGH (ref 70–99)

## 2019-04-10 LAB — PHOSPHORUS: Phosphorus: 3.5 mg/dL (ref 2.5–4.6)

## 2019-04-10 LAB — CBC
HCT: 26.6 % — ABNORMAL LOW (ref 39.0–52.0)
Hemoglobin: 9.1 g/dL — ABNORMAL LOW (ref 13.0–17.0)
MCH: 30.1 pg (ref 26.0–34.0)
MCHC: 34.2 g/dL (ref 30.0–36.0)
MCV: 88.1 fL (ref 80.0–100.0)
Platelets: 281 10*3/uL (ref 150–400)
RBC: 3.02 MIL/uL — ABNORMAL LOW (ref 4.22–5.81)
RDW: 14.6 % (ref 11.5–15.5)
WBC: 15.7 10*3/uL — ABNORMAL HIGH (ref 4.0–10.5)
nRBC: 0 % (ref 0.0–0.2)

## 2019-04-10 LAB — MAGNESIUM: Magnesium: 2.1 mg/dL (ref 1.7–2.4)

## 2019-04-10 MED ORDER — ENSURE ENLIVE PO LIQD
237.0000 mL | Freq: Three times a day (TID) | ORAL | Status: DC
  Administered 2019-04-10 – 2019-04-13 (×7): 237 mL via ORAL

## 2019-04-10 MED ORDER — TRACE MINERALS CR-CU-MN-SE-ZN 10-1000-500-60 MCG/ML IV SOLN
INTRAVENOUS | Status: AC
  Administered 2019-04-10: 18:00:00 via INTRAVENOUS
  Filled 2019-04-10: qty 1680

## 2019-04-10 MED ORDER — FAT EMULSION PLANT BASED 20 % IV EMUL
250.0000 mL | INTRAVENOUS | Status: AC
  Administered 2019-04-10: 250 mL via INTRAVENOUS
  Filled 2019-04-10: qty 250

## 2019-04-10 NOTE — Consult Note (Signed)
PHARMACY - ADULT TOTAL PARENTERAL NUTRITION CONSULT NOTE   Pharmacy Consult for TPN Indication: Bowel obstruction  Patient Measurements: Height: 5\' 1"  (154.9 cm) Weight: 133 lb 9.6 oz (60.6 kg) IBW/kg (Calculated) : 52.3 TPN AdjBW (KG): 55.6 Body mass index is 25.24 kg/m.   Assessment:  Micheal [name] a 85 y.o.malewith Past medical history ofCOPD, CAD, type II DM, GERD, HTN, HLD, active smoker. Patient will likely have surgery today, d/t SBO and has been NPO. He is noted to be clinically improved this morning with reports of flatus 11 Days Post-Op s/pexploratory laparotomy and lysis of adhesionsfor small bowel obstruction.  Anticoagulant: LMWH Hgb 12.2---->9.1 PLT wnl GI: PPI IV -LBM 11/19 Endo: moderate SSI Q4H, insulin in TPN  A1c 8.3 -SSI Insulin requirements in the past 24 hours: 13 u (Novolog) -Insulin in TPN: 20 units (regular) Lytes:  Hyponatremia corrected w/ decreased volume All other electrolytes wnl Renal: Scr 2.14--->1.10-->1.32 Pulm:  Cards:  Hepatobil: Neuro: ID:WBC 3.8 > 11.5 ---> 22.8--> 15.4-->18.4--->15.7 11/12 Aspiration PNA w/ Psudomonas in tracheal aspirate  Cefepime 11/12>> Azithromycin 11/12>>11/17 Metronidazole 11/12>>11/16  TPN Access:  PICC line placement 11/9  TPN start date: 03/30/2019 Nutritional Goals (per RD recommendation on 03/30/2019): Cal: 1863 Protein: 96 g Fluid: 1.6-1.8 L Goal TPN rate is 83 ml/hr (Provides 1863 calories and 96 grams of protein)  Current Nutrition:   Plan:   Continue Clinimix E 5/15 TPN at 70 mL/hr:  continue at reduced rate; plan is to taper TPN tomorrow  Continue 20% lipid emulsion 265ml daily @ 20 mL/hr x 12 hrs   This TPN provides (at goal) 384 kcal of protein and 979.2 kcal of dextrose which provides 1363.2 kCals per day, meeting 90% of patient needs  Electrolytes in TPN: No additional  Add MVI daily, trace elements daily, thiamine 123XX123 mg daily, folic acid 1 mg, hx ETOH  Insulin:  increase to 20 units of regular insulin in TPN    Continue Q4H moderate/NPO SSI and adjust as needed. To start CLD today 11/20  -Monitor TPN labs:   Albumin Monday and Thursday  BMP, Mag, Phosphorus Monday and Thursday  Liver function weekly  TG weekly   -F/U: Electrolytes with AM labs and monitor BG   Dallie Piles, PharmD 04/10/2019,7:21 AM

## 2019-04-10 NOTE — TOC Initial Note (Signed)
Transition of Care [facility]) - Initial/Assessment Note    Patient Details  Name: Micheal [name] MRN: [id number] Date of Birth: Oct 31, 1932  Transition of Care [facility] CM/SW Contact:    Beverly Sessions, RN Phone Number: 04/10/2019, 4:04 PM  Clinical Narrative:                  Patient admitted from home with SBO  Patient lives at home with wife PCP Abid  Daughter lives locally and provides transportation to appointments  Pharmacy [facility] - denies issues with transportation  PT has assessed patient and upgraded recommendations to home health PT.  Patient declines home health at discharge, wife supports him in his decision.  Patient has RW, and cane in the home.  BSC delivered to room toady by Leroy Sea with [employer]  MD notified that patient declines home health  Expected Discharge Plan: Home/Self Care     Patient Goals and CMS Choice        Expected Discharge Plan and Services Expected Discharge Plan: Home/Self Care   Discharge Planning Services: CM Consult   Living arrangements for the past 2 months: Single Family Home                 DME Arranged: Bedside commode DME Agency: [employer] Date DME Agency Contacted: 04/10/19   Representative spoke with at DME Agency: Leroy Sea HH Arranged: Refused SNF, Patient Refused HH          Prior Living Arrangements/Services Living arrangements for the past 2 months: [place] Lives with:: Spouse Patient language and need for interpreter reviewed:: Yes Do you feel safe going back to the place where you live?: Yes      Need for Family Participation in Patient Care: Yes (Comment) Care giver support system in place?: Yes (comment) Current home services: DME Criminal Activity/Legal Involvement Pertinent to Current Situation/Hospitalization: No - Comment as needed  Activities of Daily Living Home Assistive Devices/Equipment: None ADL Screening (condition at time of admission) Patient's cognitive ability adequate to safely  complete daily activities?: Yes Is the patient deaf or have difficulty hearing?: No Does the patient have difficulty seeing, even when wearing glasses/contacts?: No Does the patient have difficulty concentrating, remembering, or making decisions?: No Patient able to express need for assistance with ADLs?: Yes Does the patient have difficulty dressing or bathing?: No Independently performs ADLs?: Yes (appropriate for developmental age) Does the patient have difficulty walking or climbing stairs?: Yes Weakness of Legs: Both Weakness of Arms/Hands: Both  Permission Sought/Granted                  Emotional Assessment       Orientation: : Oriented to Self, Oriented to Place, Oriented to  Time, Oriented to Situation   Psych Involvement: No (comment)  Admission diagnosis:  SBO (small bowel obstruction) ([place]) [K56.609] AKI (acute kidney injury) ([place]) [N17.9] Small bowel obstruction due to adhesions [facility]) [K56.50] Patient Active Problem List   Diagnosis Date Noted  . Smoker 03/26/2019  . Acute renal failure superimposed on stage 3b chronic kidney disease ([place]) 03/26/2019  . Hyponatremia 03/26/2019  . Leucocytosis 03/26/2019  . SBO (small bowel obstruction) ([place]) 03/25/2019  . Bimalleolar ankle fracture, left, closed, initial encounter 01/09/2018  . Respiratory failure with hypoxia and hypercapnia ([place]) 08/19/2017  . Community acquired pneumonia of left lower lobe of lung   . Type 2 diabetes mellitus, uncontrolled, with renal complications ([place]) [id number]  . Gastroesophageal reflux disease without esophagitis 05/26/2015  . Alcohol  abuse 01/01/2013  . COPD (chronic obstructive pulmonary disease) ([place]) 10/22/2012  . HTN (hypertension) 10/22/2012   PCP:  Tennis Must, MD Pharmacy:   [facility], [place] [address] Phone: [phone] Fax: [phone]  [id number] [place], [place] - [address] [place] [address] Phone: [phone] Fax: [phone]     Social Determinants of Health (SDOH) Interventions    Readmission Risk Interventions Readmission Risk Prevention Plan 04/10/2019 01/12/2018  Transportation Screening Complete Complete  PCP or Specialist Appt within 5-7 Days - Complete  Home Care Screening - Complete  Medication Review (RN CM) - Complete  Medication Review (RN Care Manager) Complete -  [place] or Home Care Consult Patient refused -  Palliative Care Screening Not Applicable -  [place] Patient Refused -  Some recent data might be hidden

## 2019-04-10 NOTE — Consult Note (Signed)
Pharmacy Antibiotic Note  Micheal [name] is a 66 YOM admitted on 03/25/2019 with complaints of abdominal pain, nausea, vomiting, and constipation. CT abdomen was indicative of SBO. PMH includes: COPD, CAD, T2DM, GERD, HTN, HLD, and active smoker. On 11/12 the patient was examined for worsening rhonchorous sounds and increasing O2 demand overnight. CXR and leukocytosis was suggestive of aspiration PNA. On 11/13 his respiratory culture grew out Pseudomonas aeruginosa. On 11/17 his WBC increased and he was noted to have worsening respiratory sounds and cefepime was changed to Zosyn. Leukocytosis is somewhat improved although still elevated. This is day #9 of IV antibiotics  Plan:  Continue cefepime 2g q12h (due to CrCl 30-60 mL/min)  Monitor renal function and adjust as needed.  Height: 5\' 1"  (154.9 cm) Weight: 133 lb 9.6 oz (60.6 kg) IBW/kg (Calculated) : 52.3  Temp (24hrs), Avg:97.8 F (36.6 C), Min:97.6 F (36.4 C), Max:98.1 F (36.7 C)  Recent Labs  Lab 04/06/19 0652 04/07/19 0529 04/08/19 0914 04/09/19 0524 04/10/19 0516  WBC 15.4* 18.4* 17.8* 17.5* 15.7*  CREATININE 1.13 1.10 1.28* 1.37* 1.32*    Estimated Creatinine Clearance: 30.8 mL/min (A) (by C-G formula based on SCr of 1.32 mg/dL (H)).    No Known Allergies  Antimicrobials this admission: Ertapenem 11/9 x 1  Unasyn 11/12 >> 11/13 Azithromycin 11/13 >> 11/17 Metronidazole 11/13 >> 11/16 Zosyn 11/17 >> 11/18 Cefepime 11/13 >> 11/17  11/18 >>  Microbiology results: 11/12 Sputum: P aeruginosa 11/12 Blood Cx: NG  11/12 SARS Co-V-2: negative   Thank you for allowing pharmacy to be a part of this patient's care.  Dallie Piles, PharmD  04/10/2019 7:32 AM

## 2019-04-10 NOTE — Progress Notes (Signed)
[facility] Day(s): 16.   Post op day(s): 11 Days Post-Op.   Interval History:  Patient seen and examined no acute events or new complaints overnight - NGT removed yesterday.  Patient reports he continues to feel improved. Abdominal pain resolved and distension improving. No fever, chills, nausea, or emesis.  On CLD as of yesterday evening + TPN Had 3 BMs yesterday, continues to pass flatus.  Leukocytosis improving - 15.7K Renal function remains at baseline - good U/O 2.8 L   Vital signs in last 24 hours: [min-max] current  Temp:  [97.6 F (36.4 C)-98.1 F (36.7 C)] 98.1 F (36.7 C) (11/20 0427) Pulse Rate:  [79-111] 91 (11/20 0427) Resp:  [16-24] 20 (11/20 0427) BP: (122-138)/(55-79) 122/55 (11/20 0427) SpO2:  [92 %-96 %] 96 % (11/20 0427) Weight:  [60.6 kg] 60.6 kg (11/20 0500)     Height: 5\' 1"  (154.9 cm) Weight: 60.6 kg BMI (Calculated): 25.26   Intake/Output last 2 shifts:  11/19 0701 - 11/20 0700 In: 157.3 [I.V.:57.3; IV Piggyback:100] Out: 2890 [Urine:2865; Emesis/NG output:25]   Physical Exam:  Constitutional: alert, cooperative and no distress  Respiratory: breathing non-labored at rest  Cardiovascular: regular rate and sinus rhythm  Gastrointestinal: soft, non-tender, improvement in distension, no rebound/guarding.No peritonitis  Integumentary: Laparotomy incision is CDI with staples, no erythema or drainage. Vesicle lesion on LLE starting to crust over  Labs:  CBC Latest Ref Rng & Units 04/10/2019 04/09/2019 04/08/2019  WBC 4.0 - 10.5 K/uL 15.7(H) 17.5(H) 17.8(H)  Hemoglobin 13.0 - 17.0 g/dL 9.1(L) 9.4(L) 9.5(L)  Hematocrit 39.0 - 52.0 % 26.6(L) 28.0(L) 29.1(L)  Platelets 150 - 400 K/uL 281 301 273   CMP Latest Ref Rng & Units 04/10/2019 04/09/2019 04/08/2019  Glucose 70 - 99 mg/dL 142(H) 195(H) 145(H)  BUN 8 - 23 mg/dL 38(H) 37(H) 36(H)  Creatinine 0.61 - 1.24 mg/dL 1.32(H) 1.37(H) 1.28(H)  Sodium 135 -  145 mmol/L 134(L) 135 135  Potassium 3.5 - 5.1 mmol/L 4.7 4.5 4.4  Chloride 98 - 111 mmol/L 98 97(L) 86(L)  CO2 22 - 32 mmol/L 29 29 31   Calcium 8.9 - 10.3 mg/dL 8.3(L) 8.4(L) 7.9(L)  Total Protein 6.5 - 8.1 g/dL - - -  Total Bilirubin 0.3 - 1.2 mg/dL - - -  Alkaline Phos 38 - 126 U/L - - -  AST 15 - 41 U/L - - -  ALT 0 - 44 U/L - - -    Imaging studies: No new pertinent imaging studies   Assessment/Plan:  85 y.o. male who continues to improve 11 Days Post-Op s/p exploratory laparotomy and lysis of adhesionsfor small bowel obstruction, complicated by likely aspiration PNA with cultures growing pseudomonas and now with suspected zoster on started on Valtrex 11/15   - Advance to FLD for dinner   - Continue full rate TPN today; monitor nutritional labs\  - Monitor leukocytosis; improving  - Continue IV ABx (Cefepime)  - Repeat suppository - pain control prn (minimize narcotics); antiemetics prn - monitor abdominal examination+/- KUBs - Monitor on-going bowel function - medical management of comorbidities; hold home medications for now - Mobilization encouraged; PT following- recommending SNF   All of the above findings and recommendations were discussed with the patient, and the medical team, and all of patient's questions were answered to his expressed satisfaction.  -- Edison Simon, PA-C [facility] 04/10/2019, 7:32 AM [phone] M-F: 7am - 4pm

## 2019-04-10 NOTE — Progress Notes (Signed)
Physical Therapy Treatment Patient Details Name: Micheal [name] MRN: [id number] DOB: 02-Aug-1932 Today's Date: 04/10/2019    History of Present Illness Pt is an 85 y.o. male presenting to hospital 11/4 with several days of generalized abdominal discomfort, vomiting, and nausea.  Pt admitted with SBO with NG tube placed. Pt s/p 11/9 exploratory lap and lysis of adhesions.  PMH includes h/o balance problem, COPD, DM, htn, MI, PNA, cardiac cath, ORIF L ankle fx 01/10/18, L distal radius fx 01/10/19, anterolateral R 9th and 10th rib fx's 11/27/18.    PT Comments    Pt presented with deficits in strength, transfers, mobility, gait, balance, and activity tolerance but continued to make progress towards goals this session.  Pt required decreased assistance with bed mobility tasks and was able to demonstrate increased activity tolerance with amb along with decreased lean on the RW all with no adverse symptoms reported.  Based on the pt's progress towards goals over the last several sessions I anticipate that the patient will be safe to return home upon discharge from acute care with HHPT services to address the above deficits.      Follow Up Recommendations  Home health PT;Supervision - Intermittent     Equipment Recommendations  Rolling walker with 5" wheels;3in1 (PT)    Recommendations for Other Services       Precautions / Restrictions Precautions Precautions: Fall Precaution Comments: abdominal incision Restrictions Weight Bearing Restrictions: No    Mobility  Bed Mobility Overal bed mobility: Modified Independent Bed Mobility: Rolling;Sidelying to Sit Rolling: Modified independent (Device/Increase time) Sidelying to sit: Modified independent (Device/Increase time)       General bed mobility comments: Extra time and effort during rolling and sidelying to sit but no assistance or cues for sequencing required this session  Transfers Overall transfer level: Needs assistance Equipment  used: Rolling walker (2 wheeled) Transfers: Sit to/from Stand Sit to Stand: Min guard         General transfer comment: Min verbal cues for hand placement  Ambulation/Gait Ambulation/Gait assistance: Min guard Gait Distance (Feet): 80 Feet Assistive device: Rolling walker (2 wheeled) Gait Pattern/deviations: Step-through pattern;Decreased step length - right;Decreased step length - left;Trunk flexed Gait velocity: decreased   General Gait Details: Slow cadence with short B step length but steady without LOB with activity tolerance slowly progressing   Stairs             Wheelchair Mobility    Modified Rankin (Stroke Patients Only)       Balance Overall balance assessment: Needs assistance Sitting-balance support: Feet supported;Single extremity supported Sitting balance-Leahy Scale: Good     Standing balance support: During functional activity;Bilateral upper extremity supported Standing balance-Leahy Scale: Good Standing balance comment: Min BUE lean on RW for balance during ambulation                            Cognition Arousal/Alertness: Awake/alert Behavior During Therapy: WFL for tasks assessed/performed Overall Cognitive Status: Within Functional Limits for tasks assessed                                        Exercises Total Joint Exercises Ankle Circles/Pumps: Strengthening;Both;10 reps Quad Sets: Strengthening;Both;10 reps Gluteal Sets: Strengthening;Both;10 reps Heel Slides: AROM;Both;5 reps Hip ABduction/ADduction: AROM;Both;10 reps Long Arc Quad: AROM;Both;10 reps;Strengthening Knee Flexion: AROM;Strengthening;Both;10 reps Marching in Standing: AROM;Both;10 reps;Standing Other Exercises  Other Exercises: Mini squats 2 x 5 Other Exercises: HEP education for BLE APs, QS, and GS x 10 each every 1-2 hours daily    General Comments        Pertinent Vitals/Pain Pain Assessment: No/denies pain    Home Living                       Prior Function            PT Goals (current goals can now be found in the care plan section) Progress towards PT goals: Progressing toward goals    Frequency    Min 2X/week      PT Plan Discharge plan needs to be updated    Co-evaluation              AM-PAC PT "6 Clicks" Mobility   Outcome Measure  Help needed turning from your back to your side while in a flat bed without using bedrails?: A Little Help needed moving from lying on your back to sitting on the side of a flat bed without using bedrails?: A Little Help needed moving to and from a bed to a chair (including a wheelchair)?: A Little Help needed standing up from a chair using your arms (e.g., wheelchair or bedside chair)?: A Little Help needed to walk in hospital room?: A Little Help needed climbing 3-5 steps with a railing? : A Little 6 Click Score: 18    End of Session Equipment Utilized During Treatment: Gait belt;Other (comment)(Under arms to avoid incision) Activity Tolerance: Patient tolerated treatment well Patient left: in chair;with call bell/phone within reach;with chair alarm set;with nursing/sitter in room Nurse Communication: Mobility status PT Visit Diagnosis: Difficulty in walking, not elsewhere classified (R26.2);Muscle weakness (generalized) (M62.81)     Time: [id number] PT Time Calculation (min) (ACUTE ONLY): 26 min  Charges:  $Gait Training: 8-22 mins $Therapeutic Exercise: 8-22 mins                     D. Scott Lacy Sofia PT, DPT 04/10/19, 1:51 PM

## 2019-04-10 NOTE — Progress Notes (Signed)
PROGRESS NOTE  Micheal [name] [id number] DOB: 05-Sep-1932 DOA: 03/25/2019 PCP: Tennis Must, MD  Brief History   The patient is an 85 yr old man with a medical history significant for COPD, CAD, DM II, GERD, hypertension, hyperlipidemia, tobacco abuse. He presented to [facility] ED on 03/26/2019 from home with complaint of abdominal pain, nausea, and vomiting. It had been 6 days since his last BM. CT of the abdomen demonstrated a high grade wmall bowel obstruction with transition point in the central lower abdomen potentially due to an adhesion.   The patient was admitted to a general surgery service. The hospitalist service was consulted for comanagement of medical issues. The patient was taken to the OR on 03/30/2019 for exploratory laparotomy with adhesiolysis after conservative measures failed to resolve the problem. The patient's hospital course have been complicated by an ICU admission for worsening clinical picture and singles. In the last 24 hours the patient has developed coarse upper airway sounds. However he has had a BM and return of bowel function. NGT has been removed. He has had 3 BM's in the last 24 hours. The patient is now on a clear liquid diet.  The patient remains on TPN per surgery.  Consultants  . [facility]  Procedures  Exploratory laparotomy with adhesiolysis on 03/30/2019  Antibiotics   Anti-infectives (From admission, onward)   Start     Dose/Rate Route Frequency Ordered Stop   04/08/19 1000  ceFEPIme (MAXIPIME) 2 g in sodium chloride 0.9 % 100 mL IVPB     2 g 200 mL/hr over 30 Minutes Intravenous Every 12 hours 04/08/19 0916     04/07/19 1700  piperacillin-tazobactam (ZOSYN) IVPB 3.375 g  Status:  Discontinued     3.375 g 12.5 mL/hr over 240 Minutes Intravenous Every 8 hours 04/07/19 1520 04/08/19 0847   04/05/19 1730  valACYclovir (VALTREX) tablet 1,000 mg     1,000 mg Oral Daily 04/05/19 1724 04/12/19 0959   04/05/19 1715  valACYclovir (VALTREX) tablet 1,000  mg  Status:  Discontinued     1,000 mg Oral Daily 04/05/19 1714 04/05/19 1724   04/03/19 2200  metroNIDAZOLE (FLAGYL) IVPB 500 mg  Status:  Discontinued     500 mg 100 mL/hr over 60 Minutes Intravenous Every 8 hours 04/03/19 1234 04/06/19 1155   04/03/19 1800  azithromycin (ZITHROMAX) 500 mg in sodium chloride 0.9 % 250 mL IVPB  Status:  Discontinued     500 mg 250 mL/hr over 60 Minutes Intravenous Every 24 hours 04/03/19 1234 04/07/19 1520   04/03/19 1400  ceFEPIme (MAXIPIME) 2 g in sodium chloride 0.9 % 100 mL IVPB  Status:  Discontinued     2 g 200 mL/hr over 30 Minutes Intravenous Every 12 hours 04/03/19 1234 04/07/19 1520   04/02/19 0900  Ampicillin-Sulbactam (UNASYN) 3 g in sodium chloride 0.9 % 100 mL IVPB  Status:  Discontinued     3 g 200 mL/hr over 30 Minutes Intravenous Every 6 hours 04/02/19 0756 04/03/19 1234   03/30/19 1600  ertapenem (INVANZ) 1,000 mg in sodium chloride 0.9 % 100 mL IVPB     1 g 200 mL/hr over 30 Minutes Intravenous On call to O.R. 03/30/19 1558 03/30/19 1642     Subjective  The patient is resting comfortably. No new complaints.   Objective   Vitals:  Vitals:   04/10/19 0427 04/10/19 1200  BP: (!) 122/55 (!) 110/56  Pulse: 91 99  Resp: 20   Temp: 98.1 F (36.7 C)  SpO2: 96%    Exam:  Constitutional:  . The patient is awake, alert, and oriented x 3. No acute distress. Respiratory:  . No increased work of breathing. . No wheezes, rales, or rhonchi . No tactile fremitus Cardiovascular:  . Regular rate and rhythm . No murmurs, ectopy, or gallups. . No lateral PMI. No thrills. Abdomen:  . Abdomen is soft, non-tender, non-distended . No hernias, masses, or organomegaly . Normoactive bowel sounds.  . Surgical site is clean and dry. Marland Kitchen NGT is removed. Musculoskeletal:  . No cyanosis, clubbing, or edema Skin:  . No rashes, lesions, ulcers . palpation of skin: no induration or nodules Neurologic:  . CN 2-12 intact . Sensation all 4  extremities intact Psychiatric:  . Mental status o Mood, affect appropriate o Orientation to person, place, time  . judgment and insight appear intact  I have personally reviewed the following:   Today's Data  . Vitals, I's and O's, BMP, CBC  Micro Data  . Pulmonary aspirate is positive for pseudomonas aeruginosa.  Imaging  . Abdominal film 04/07/2019 . CXR 04/02/2019: Progressive bibasilar infiltrates. And fractured 9th rib.  Scheduled Meds: . Chlorhexidine Gluconate Cloth  6 each Topical Daily  . enoxaparin (LOVENOX) injection  40 mg Subcutaneous Q24H  . feeding supplement (ENSURE ENLIVE)  237 mL Oral TID BM  . insulin aspart  0-15 Units Subcutaneous Q4H  . mouth rinse  15 mL Mouth Rinse BID  . metoprolol tartrate  5 mg Intravenous Q6H  . mometasone-formoterol  2 puff Inhalation BID  . nicotine  14 mg Transdermal Daily  . pantoprazole (PROTONIX) IV  40 mg Intravenous QHS  . polyvinyl alcohol  1 drop Both Eyes BID  . tamsulosin  0.4 mg Oral Daily  . valACYclovir  1,000 mg Oral Daily   Continuous Infusions: . Marland KitchenTPN (CLINIMIX-E) Adult 70 mL/hr at 04/09/19 1725  . Marland KitchenTPN (CLINIMIX-E) Adult    . sodium chloride 30 mL (04/10/19 1231)  . ceFEPime (MAXIPIME) IV 2 g (04/10/19 1233)  . Fat emulsion      Principal Problem:   SBO (small bowel obstruction) (HCC) Active Problems:   Type 2 diabetes mellitus, uncontrolled, with renal complications (HCC)   COPD (chronic obstructive pulmonary disease) (HCC)   Gastroesophageal reflux disease without esophagitis   HTN (hypertension)   Smoker   Acute renal failure superimposed on stage 3b chronic kidney disease (HCC)   Hyponatremia   Leucocytosis   LOS: 16 days   A & P  Sepsis suspect secondary to Pseudomonas pneumonia/aspiration pneumonia:  Leukocytosis is trending down 18.4K to 15.7 today. Procalcitonin 0.21 on 04/05/19 from  0.4 on 04/04/19 from 1.28 on 04/02/2019. Continue with cefepime 2 gm Q 12 hours. IV azithromycin stopped  after 04/06/2019.  Sputum culture grew Pseudomonas, continue to follow cultures for sensitivities. Sensitivity to Cefepime. Blood cultures have had no growth to date. Continue to Monitor fever curve and WBC. Patient is afebrile.  Monitor leukocytosis.  Acute hypoxic respiratory failure suspect secondary to Pseudomonas/aspiration pneumonia: Not on oxygen supplementation at baseline. He is currently saturating 96% on room air. Will discontinuoud continuous pulse oximetry. Continue bronchodilators. Patient refuses any further suctioning per RT. Coarse airway sounds have cleared.  Suspected zoster: Left buttock and lateral left lower extremity. The patient is receiving valtrex. Wound care following. No history of zoster vaccination.  Post op s/p exploratory laparotomy and lysis of adhesions: Management per primary service. NGT out today. TPN continued. The patient is a clear liquid diet. Tolerating  well. 3 BM's yesterday.  AKI: Resolved. The patient's baseline creatinine appears to be 1.0 with GFR greater than 60. He presented with creatinine 2.1 with GFR of 27. Today's creatinine is 1.35. Monitor creatinine, volume status, and electrolytes. Avoid nephrotoxins and hypotension.   Hypovolemic hyponatremia: Na+ 134. Managed with TPN. Follow chemistry.  Small bilateral pleural effusions: On antibiotic cefepime continue. Follow CXR and volume status.  Severe protein calorie malnutrition: On TPN managed by pharmacy. Albumin of 2.1 with BMI of 21 with muscle loss. Monitor.  Hypophosphatemia: resolved.  Right eighth rib fracture, incidentally found: Continue close monitoring and address as indicated.  DM II: Hemoglobin A1c 8.3. Glucoses are followed with FSBS and SSI. FSBS over the past 24 hours have been 123-172. Continue to hold Home meds include glipizide and Metformin.   Essential hypertension: Blood pressures are well controlled with metoprolol only now. Monitor and add back lisinopril iff  needed.   Hyperlipidemia: Continue statin as at home.  CAD: Continue Statin, ASA, and metoprolol.   COPD: Noted and stable. Continue bronchodilators.  GERD:  Continue IV PPI  Current daily smoker: Tobacco cessation counseling at bedside. Nicotine patch is available.  BPH: Flomax on hold due to n.p.o.  Monitor urine output.  I have seen and examined this patient myself. I have spent 30 minutes in his evaluation and care.  DVT prophylaxis:Heparin subcu 3 times daily Code Status:Code Status: Full Code Family communication: Wife at bedside.  Disposition: TBD.  Unique Sillas, DO [facility] Direct contact: see [email or web address]  7PM-7AM contact night coverage as above 04/10/2019, 4:02 PM  LOS: 14 days

## 2019-04-11 LAB — URINALYSIS, COMPLETE (UACMP) WITH MICROSCOPIC
Bacteria, UA: NONE SEEN
Bilirubin Urine: NEGATIVE
Glucose, UA: NEGATIVE mg/dL
Ketones, ur: NEGATIVE mg/dL
Leukocytes,Ua: NEGATIVE
Nitrite: NEGATIVE
Protein, ur: 30 mg/dL — AB
Specific Gravity, Urine: 1.008 (ref 1.005–1.030)
Squamous Epithelial / HPF: NONE SEEN (ref 0–5)
pH: 6 (ref 5.0–8.0)

## 2019-04-11 LAB — BASIC METABOLIC PANEL
Anion gap: 7 (ref 5–15)
BUN: 46 mg/dL — ABNORMAL HIGH (ref 8–23)
CO2: 26 mmol/L (ref 22–32)
Calcium: 8.4 mg/dL — ABNORMAL LOW (ref 8.9–10.3)
Chloride: 100 mmol/L (ref 98–111)
Creatinine, Ser: 1.32 mg/dL — ABNORMAL HIGH (ref 0.61–1.24)
GFR calc Af Amer: 57 mL/min — ABNORMAL LOW (ref >60)
GFR calc non Af Amer: 49 mL/min — ABNORMAL LOW (ref >60)
Glucose, Bld: 109 mg/dL — ABNORMAL HIGH (ref 70–99)
Potassium: 4.6 mmol/L (ref 3.5–5.1)
Sodium: 133 mmol/L — ABNORMAL LOW (ref 135–145)

## 2019-04-11 LAB — GLUCOSE, CAPILLARY
Glucose-Capillary: 158 mg/dL — ABNORMAL HIGH (ref 70–99)
Glucose-Capillary: 214 mg/dL — ABNORMAL HIGH (ref 70–99)
Glucose-Capillary: 84 mg/dL (ref 70–99)
Glucose-Capillary: 207 mg/dL — ABNORMAL HIGH (ref 70–99)
Glucose-Capillary: 213 mg/dL — ABNORMAL HIGH (ref 70–99)
Glucose-Capillary: 117 mg/dL — ABNORMAL HIGH (ref 70–99)

## 2019-04-11 LAB — CBC
HCT: 25.9 % — ABNORMAL LOW (ref 39.0–52.0)
Hemoglobin: 8.6 g/dL — ABNORMAL LOW (ref 13.0–17.0)
MCH: 29.9 pg (ref 26.0–34.0)
MCHC: 33.2 g/dL (ref 30.0–36.0)
MCV: 89.9 fL (ref 80.0–100.0)
Platelets: 246 10*3/uL (ref 150–400)
RBC: 2.88 MIL/uL — ABNORMAL LOW (ref 4.22–5.81)
RDW: 14.6 % (ref 11.5–15.5)
WBC: 14.6 10*3/uL — ABNORMAL HIGH (ref 4.0–10.5)
nRBC: 0 % (ref 0.0–0.2)

## 2019-04-11 MED ORDER — FAT EMULSION PLANT BASED 20 % IV EMUL
250.0000 mL | INTRAVENOUS | Status: AC
  Administered 2019-04-11: 250 mL via INTRAVENOUS
  Filled 2019-04-11: qty 250

## 2019-04-11 MED ORDER — TRACE MINERALS CR-CU-MN-SE-ZN 10-1000-500-60 MCG/ML IV SOLN
INTRAVENOUS | Status: AC
  Administered 2019-04-11: 18:00:00 via INTRAVENOUS
  Filled 2019-04-11: qty 960

## 2019-04-11 NOTE — Consult Note (Signed)
PHARMACY - ADULT TOTAL PARENTERAL NUTRITION CONSULT NOTE   Pharmacy Consult for TPN Indication: Bowel obstruction  Patient Measurements: Height: 5\' 1"  (154.9 cm) Weight: 133 lb 9.6 oz (60.6 kg) IBW/kg (Calculated) : 52.3 TPN AdjBW (KG): 55.6 Body mass index is 25.24 kg/m.   Assessment:  Micheal [name] a 85 y.o.malewith Past medical history ofCOPD, CAD, type II DM, GERD, HTN, HLD, active smoker. Patient will likely have surgery today, d/t SBO and has been NPO. He is noted to be clinically improved this morning with reports of flatus 11 Days Post-Op s/pexploratory laparotomy and lysis of adhesionsfor small bowel obstruction.  Anticoagulant: LMWH Hgb 12.2---->9.1 PLT wnl GI: PPI IV -LBM 11/19 Endo: moderate SSI Q4H, insulin in TPN  A1c 8.3 -SSI Insulin requirements in the past 24 hours: 18 u (Novolog) -Insulin in TPN: 20 units (regular) Lytes:  Hyponatremia corrected w/ decreased volume All other electrolytes wnl Renal: Scr 2.14--->1.10-->1.32 Pulm:  Cards:  Hepatobil: Neuro: ID:WBC 3.8 > 11.5 ---> 22.8--> 15.4-->18.4--->15.7 11/12 Aspiration PNA w/ Psudomonas in tracheal aspirate  Cefepime 11/12>> Azithromycin 11/12>>11/17 Metronidazole 11/12>>11/16  TPN Access:  PICC line placement 11/9  TPN start date: 03/30/2019 Nutritional Goals (per RD recommendation on 03/30/2019): Cal: 1863 Protein: 96 g Fluid: 1.6-1.8 L Goal TPN rate is 83 ml/hr (Provides 1863 calories and 96 grams of protein)  Current Nutrition:   Plan:   Decrease Clinimix E 5/15 TPN to 40 mL/hr.   Continue 20% lipid emulsion 235ml daily @ 20 mL/hr x 12 hrs   Electrolytes in TPN: No additional  Add MVI daily, trace elements daily, thiamine [id number] mg daily, folic acid 1 mg, hx ETOH  Insulin:decrease to 10 units of regular insulin in TPN    Continue Q4H moderate/NPO SSI and adjust as needed. To start CLD 11/20  -Monitor TPN labs:   Albumin Monday and Thursday  BMP, Mag, Phosphorus Monday  and Thursday  Liver function weekly  TG weekly   -F/U: Electrolytes with AM labs and monitor BG   Pamalee Marcoe K, RPH 04/11/2019,11:53 AM

## 2019-04-11 NOTE — Progress Notes (Signed)
Debilitated pt s/p E. lap SBO Full liquid.  Still debilitated.  On TPN. Zoster  Wbc continues to improve  PE debilitated Abd: soft, incision staples in plce, no peritonitis  A/p liberalize diet Wean off tpn DC planning PT

## 2019-04-11 NOTE — Progress Notes (Signed)
MD notified: The patient indicates he has started to have burning with urination.

## 2019-04-11 NOTE — Progress Notes (Signed)
PROGRESS NOTE  Micheal [name] [id number] DOB: [birth year] DOA: 03/25/2019 PCP: Tennis Must, MD  Brief History   The patient is an 85 yr old man with a medical history significant for COPD, CAD, DM II, GERD, hypertension, hyperlipidemia, tobacco abuse. He presented to [facility] ED on 03/26/2019 from home with complaint of abdominal pain, nausea, and vomiting. It had been 6 days since his last BM. CT of the abdomen demonstrated a high grade wmall bowel obstruction with transition point in the central lower abdomen potentially due to an adhesion.   The patient was admitted to a general surgery service. The hospitalist service was consulted for comanagement of medical issues. The patient was taken to the OR on 03/30/2019 for exploratory laparotomy with adhesiolysis after conservative measures failed to resolve the problem. The patient's hospital course have been complicated by an ICU admission for worsening clinical picture and singles. In the last 24 hours the patient has developed coarse upper airway sounds. However he has had a BM and return of bowel function. NGT has been removed. He has had 3 BM's in the last 24 hours. The patient is now on a clear liquid diet.  The patient remains on TPN per surgery.  Consultants  . [facility]  Procedures  Exploratory laparotomy with adhesiolysis on 03/30/2019  Antibiotics   Anti-infectives (From admission, onward)   Start     Dose/Rate Route Frequency Ordered Stop   04/08/19 1000  ceFEPIme (MAXIPIME) 2 g in sodium chloride 0.9 % 100 mL IVPB     2 g 200 mL/hr over 30 Minutes Intravenous Every 12 hours 04/08/19 0916     04/07/19 1700  piperacillin-tazobactam (ZOSYN) IVPB 3.375 g  Status:  Discontinued     3.375 g 12.5 mL/hr over 240 Minutes Intravenous Every 8 hours 04/07/19 1520 04/08/19 0847   04/05/19 1730  valACYclovir (VALTREX) tablet 1,000 mg     1,000 mg Oral Daily 04/05/19 1724 04/11/19 0907   04/05/19 1715  valACYclovir (VALTREX) tablet 1,000  mg  Status:  Discontinued     1,000 mg Oral Daily 04/05/19 1714 04/05/19 1724   04/03/19 2200  metroNIDAZOLE (FLAGYL) IVPB 500 mg  Status:  Discontinued     500 mg 100 mL/hr over 60 Minutes Intravenous Every 8 hours 04/03/19 1234 04/06/19 1155   04/03/19 1800  azithromycin (ZITHROMAX) 500 mg in sodium chloride 0.9 % 250 mL IVPB  Status:  Discontinued     500 mg 250 mL/hr over 60 Minutes Intravenous Every 24 hours 04/03/19 1234 04/07/19 1520   04/03/19 1400  ceFEPIme (MAXIPIME) 2 g in sodium chloride 0.9 % 100 mL IVPB  Status:  Discontinued     2 g 200 mL/hr over 30 Minutes Intravenous Every 12 hours 04/03/19 1234 04/07/19 1520   04/02/19 0900  Ampicillin-Sulbactam (UNASYN) 3 g in sodium chloride 0.9 % 100 mL IVPB  Status:  Discontinued     3 g 200 mL/hr over 30 Minutes Intravenous Every 6 hours 04/02/19 0756 04/03/19 1234   03/30/19 1600  ertapenem (INVANZ) 1,000 mg in sodium chloride 0.9 % 100 mL IVPB     1 g 200 mL/hr over 30 Minutes Intravenous On call to O.R. 03/30/19 1558 03/30/19 1642     Subjective  The patient is resting comfortably. No new complaints.   Objective   Vitals:  Vitals:   04/11/19 0548 04/11/19 1209  BP: (!) 135/58 (!) 139/54  Pulse: 100 93  Resp: 20 17  Temp: 98.3 F (36.8 C) 98  F (36.7 C)  SpO2: 95% 95%   Exam:  Constitutional:  . The patient is awake, alert, and oriented x 3. No acute distress. Respiratory:  . No increased work of breathing. . No wheezes, rales, or rhonchi . No tactile fremitus Cardiovascular:  . Regular rate and rhythm . No murmurs, ectopy, or gallups. . No lateral PMI. No thrills. Abdomen:  . Abdomen is soft, non-tender, non-distended . No hernias, masses, or organomegaly . Normoactive bowel sounds.  . Surgical site is clean and dry. Marland Kitchen NGT is removed. Musculoskeletal:  . No cyanosis, clubbing, or edema Skin:  . No rashes, lesions, ulcers . palpation of skin: no induration or nodules Neurologic:  . CN 2-12 intact .  Sensation all 4 extremities intact Psychiatric:  . Mental status o Mood, affect appropriate o Orientation to person, place, time  . judgment and insight appear intact  I have personally reviewed the following:   Today's Data  . Vitals, I's and O's, BMP, CBC  Micro Data  . Pulmonary aspirate is positive for pseudomonas aeruginosa.  Imaging  . Abdominal film 04/07/2019: Ileus . CXR 04/02/2019: Progressive bibasilar infiltrates. And fractured 9th rib.  Scheduled Meds: . Chlorhexidine Gluconate Cloth  6 each Topical Daily  . enoxaparin (LOVENOX) injection  40 mg Subcutaneous Q24H  . feeding supplement (ENSURE ENLIVE)  237 mL Oral TID BM  . insulin aspart  0-15 Units Subcutaneous Q4H  . mouth rinse  15 mL Mouth Rinse BID  . metoprolol tartrate  5 mg Intravenous Q6H  . mometasone-formoterol  2 puff Inhalation BID  . nicotine  14 mg Transdermal Daily  . pantoprazole (PROTONIX) IV  40 mg Intravenous QHS  . polyvinyl alcohol  1 drop Both Eyes BID  . tamsulosin  0.4 mg Oral Daily   Continuous Infusions: . Marland KitchenTPN (CLINIMIX-E) Adult 70 mL/hr at 04/10/19 1800  . Marland KitchenTPN (CLINIMIX-E) Adult    . sodium chloride 10 mL (04/11/19 0905)  . ceFEPime (MAXIPIME) IV 2 g (04/11/19 0906)  . Fat emulsion      Principal Problem:   SBO (small bowel obstruction) (HCC) Active Problems:   Type 2 diabetes mellitus, uncontrolled, with renal complications (HCC)   COPD (chronic obstructive pulmonary disease) (HCC)   Gastroesophageal reflux disease without esophagitis   HTN (hypertension)   Smoker   Acute renal failure superimposed on stage 3b chronic kidney disease (HCC)   Hyponatremia   Leucocytosis   LOS: 17 days   A & P  Sepsis suspect secondary to Pseudomonas pneumonia/aspiration pneumonia:  Leukocytosis is trending down 18.4K to 14.6 today. Procalcitonin 0.21 on 04/05/19 from  0.4 on 04/04/19 from 1.28 on 04/02/2019. Continue with cefepime 2 gm Q 12 hours. IV azithromycin stopped after  04/06/2019.  Sputum culture grew Pseudomonas, continue to follow cultures for sensitivities. Sensitivity to Cefepime. Blood cultures have had no growth to date. Continue to Monitor fever curve and WBC. Patient is afebrile.  Monitor leukocytosis.  Acute hypoxic respiratory failure suspect secondary to Pseudomonas/aspiration pneumonia: Not on oxygen supplementation at baseline. He is currently saturating 95% on room air. Will discontinuoud continuous pulse oximetry. Continue bronchodilators. Patient refuses any further suctioning per RT. Coarse airway sounds have cleared.  Suspected zoster: Left buttock and lateral left lower extremity. The patient is receiving valtrex. Wound care following. No history of zoster vaccination.  Post op s/p exploratory laparotomy and lysis of adhesions: Management per primary service. NGT out today. TPN continued. The patient is a clear liquid diet. Tolerating well. 3 BM's  yesterday.  AKI: Resolved. The patient's baseline creatinine appears to be 1.0 with GFR greater than 60. He presented with creatinine 2.1 with GFR of 27. Today's creatinine is 1.35. Monitor creatinine, volume status, and electrolytes. Avoid nephrotoxins and hypotension.   Hypovolemic hyponatremia: Na+ 133. Managed with TPN. Follow chemistry.  Small bilateral pleural effusions: On antibiotic cefepime continue. Follow CXR and volume status.  Severe protein calorie malnutrition: On TPN managed by pharmacy. Albumin of 2.1 with BMI of 21 with muscle loss. Monitor.  Hypophosphatemia: resolved.  Right eighth rib fracture, incidentally found: Continue close monitoring and address as indicated.  DM II: Hemoglobin A1c 8.3. Glucoses are followed with FSBS and SSI. FSBS over the past 24 hours have been 84-207. Continue to hold Home meds include glipizide and Metformin.   Essential hypertension: Blood pressures are well controlled with metoprolol only now. Monitor and add back lisinopril iff needed.    Hyperlipidemia: Continue statin as at home.  CAD: Continue Statin, ASA, and metoprolol.   COPD: Noted and stable. Continue bronchodilators.  GERD:  Continue IV PPI  Current daily smoker: Tobacco cessation counseling at bedside. Nicotine patch is available.  BPH: Flomax on hold due to n.p.o.  Monitor urine output.  I have seen and examined this patient myself. I have spent 32 minutes in his evaluation and care.  DVT prophylaxis:Heparin subcu 3 times daily Code Status:Code Status: Full Code Family communication: Wife at bedside.  Disposition: TBD.  Reuven Braver, DO [facility] Direct contact: see [email or web address]  7PM-7AM contact night coverage as above 04/11/2019, 2:39 PM  LOS: 14 days

## 2019-04-11 NOTE — Progress Notes (Signed)
MD notified: The patient eat 100% of his lunch and 50% of breakfast today. He had two ensures as well.

## 2019-04-12 LAB — GLUCOSE, CAPILLARY
Glucose-Capillary: 118 mg/dL — ABNORMAL HIGH (ref 70–99)
Glucose-Capillary: 160 mg/dL — ABNORMAL HIGH (ref 70–99)
Glucose-Capillary: 170 mg/dL — ABNORMAL HIGH (ref 70–99)
Glucose-Capillary: 171 mg/dL — ABNORMAL HIGH (ref 70–99)
Glucose-Capillary: 214 mg/dL — ABNORMAL HIGH (ref 70–99)
Glucose-Capillary: 220 mg/dL — ABNORMAL HIGH (ref 70–99)

## 2019-04-12 LAB — BASIC METABOLIC PANEL
Anion gap: 9 (ref 5–15)
BUN: 45 mg/dL — ABNORMAL HIGH (ref 8–23)
CO2: 25 mmol/L (ref 22–32)
Calcium: 8.5 mg/dL — ABNORMAL LOW (ref 8.9–10.3)
Chloride: 99 mmol/L (ref 98–111)
Creatinine, Ser: 1.49 mg/dL — ABNORMAL HIGH (ref 0.61–1.24)
GFR calc Af Amer: 49 mL/min — ABNORMAL LOW (ref >60)
GFR calc non Af Amer: 42 mL/min — ABNORMAL LOW (ref >60)
Glucose, Bld: 130 mg/dL — ABNORMAL HIGH (ref 70–99)
Potassium: 5.1 mmol/L (ref 3.5–5.1)
Sodium: 133 mmol/L — ABNORMAL LOW (ref 135–145)

## 2019-04-12 LAB — CBC
HCT: 26.5 % — ABNORMAL LOW (ref 39.0–52.0)
Hemoglobin: 8.6 g/dL — ABNORMAL LOW (ref 13.0–17.0)
MCH: 30.5 pg (ref 26.0–34.0)
MCHC: 32.5 g/dL (ref 30.0–36.0)
MCV: 94 fL (ref 80.0–100.0)
Platelets: 263 10*3/uL (ref 150–400)
RBC: 2.82 MIL/uL — ABNORMAL LOW (ref 4.22–5.81)
RDW: 14.8 % (ref 11.5–15.5)
WBC: 15.1 10*3/uL — ABNORMAL HIGH (ref 4.0–10.5)
nRBC: 0 % (ref 0.0–0.2)

## 2019-04-12 MED ORDER — SODIUM CHLORIDE 0.9 % IV SOLN
2.0000 g | INTRAVENOUS | Status: DC
  Administered 2019-04-13: 2 g via INTRAVENOUS
  Filled 2019-04-12 (×2): qty 2

## 2019-04-12 MED ORDER — PHENAZOPYRIDINE HCL 100 MG PO TABS
100.0000 mg | ORAL_TABLET | Freq: Three times a day (TID) | ORAL | Status: AC
  Administered 2019-04-12 – 2019-04-13 (×3): 100 mg via ORAL
  Filled 2019-04-12 (×4): qty 1

## 2019-04-12 MED ORDER — GUAIFENESIN 100 MG/5ML PO SOLN
5.0000 mL | ORAL | Status: DC | PRN
  Administered 2019-04-12: 100 mg via ORAL
  Filled 2019-04-12 (×2): qty 5

## 2019-04-12 MED ORDER — ENOXAPARIN SODIUM 30 MG/0.3ML ~~LOC~~ SOLN
30.0000 mg | SUBCUTANEOUS | Status: DC
  Administered 2019-04-12 – 2019-04-13 (×2): 30 mg via SUBCUTANEOUS
  Filled 2019-04-12 (×2): qty 0.3

## 2019-04-12 NOTE — Progress Notes (Signed)
Debilitated pt s/p E. lap SBO 11/09 Regular.  Still debilitated.  weaning  TPN. Zoster  C/o burning when urinating  PE debilitated Abd: soft, incision staples in plce, no peritonitis, there is some distension. No infection condom catheter, clear urine  A/p liberalize diet Three doses of pyridium abd xray in am Wean off tpn PT

## 2019-04-12 NOTE — Progress Notes (Signed)
Anticoagulation monitoring(Lovenox):  86yo male ordered Lovenox 40 mg Q24h  Filed Weights   04/10/19 0500 04/11/19 0548 04/12/19 0436  Weight: 133 lb 9.6 oz (60.6 kg) 133 lb 9.6 oz (60.6 kg) 132 lb 4.4 oz (60 kg)   BMI 25   Lab Results  Component Value Date   CREATININE 1.49 (H) 04/12/2019   CREATININE 1.32 (H) 04/11/2019   CREATININE 1.32 (H) 04/10/2019   Estimated Creatinine Clearance: 27.3 mL/min (A) (by C-G formula based on SCr of 1.49 mg/dL (H)). Hemoglobin & Hematocrit     Component Value Date/Time   HGB 8.6 (L) 04/12/2019 0636   HCT 26.5 (L) 04/12/2019 0636     Per Protocol for Patient with estCrcl < 30 ml/min and BMI < 40, will transition to Lovenox 30 mg Q24h.

## 2019-04-12 NOTE — Consult Note (Signed)
[place] NOTE   Pharmacy Consult for TPN Indication: Bowel obstruction  11/22 Per MD, can stop TPN today.  Patient Measurements: Height: 5\' 1"  (154.9 cm) Weight: 132 lb 4.4 oz (60 kg) IBW/kg (Calculated) : 52.3 TPN AdjBW (KG): 55.6 Body mass index is 24.99 kg/m.   Assessment:  MAUD TORREZ a 85 y.o.malewith Past medical history ofCOPD, CAD, type II DM, GERD, HTN, HLD, active smoker. Patient will likely have surgery today, d/t SBO and has been NPO. He is noted to be clinically improved this morning with reports of flatus 11 Days Post-Op s/pexploratory laparotomy and lysis of adhesionsfor small bowel obstruction.  Anticoagulant: LMWH Hgb 12.2---->9.1 PLT wnl GI: PPI IV -LBM 11/19 Endo: moderate SSI Q4H, insulin in TPN  A1c 8.3 -SSI Insulin requirements in the past 24 hours: 18 u (Novolog) -Insulin in TPN: 20 units (regular) Lytes:  Hyponatremia corrected w/ decreased volume All other electrolytes wnl Renal: Scr 2.14--->1.10-->1.32 Pulm:  Cards:  Hepatobil: Neuro: ID:WBC 3.8 > 11.5 ---> 22.8--> 15.4-->18.4--->15.7 11/12 Aspiration PNA w/ Psudomonas in tracheal aspirate  Cefepime 11/12>> Azithromycin 11/12>>11/17 Metronidazole 11/12>>11/16  TPN Access:  PICC line placement 11/9  TPN start date: 03/30/2019 Nutritional Goals (per RD recommendation on 03/30/2019): Cal: 1863 Protein: 96 g Fluid: 1.6-1.8 L Goal TPN rate is 83 ml/hr (Provides 1863 calories and 96 grams of protein)  Current Nutrition:   Plan:   Decrease Clinimix E 5/15 TPN to 40 mL/hr.   Continue 20% lipid emulsion 257ml daily @ 20 mL/hr x 12 hrs   Electrolytes in TPN: No additional  Add MVI daily, trace elements daily, thiamine [id number] mg daily, folic acid 1 mg, hx ETOH  Insulin:decrease to 10 units of regular insulin in TPN    Continue Q4H moderate/NPO SSI and adjust as needed. To start CLD 11/20  -Monitor TPN labs:   Albumin Monday and  Thursday  BMP, Mag, Phosphorus Monday and Thursday  Liver function weekly  TG weekly   -F/U: Electrolytes with AM labs and monitor BG   Verginia Toohey K, RPH 04/12/2019,12:35 PM

## 2019-04-12 NOTE — Progress Notes (Signed)
PHARMACY NOTE:  ANTIMICROBIAL RENAL DOSAGE ADJUSTMENT  Current antimicrobial regimen includes a mismatch between antimicrobial dosage and estimated renal function.  As per policy approved by the Pharmacy & Therapeutics and Medical Executive Committees, the antimicrobial dosage will be adjusted accordingly.  Current antimicrobial dosage:  Cefepime 2g IV every 12 hours.  Indication: aspiration pneumonia  Renal Function:  Estimated Creatinine Clearance: 27.3 mL/min (A) (by C-G formula based on SCr of 1.49 mg/dL (H)). []      On intermittent HD, scheduled: []      On CRRT    Antimicrobial dosage has been changed to:  Cefepime 2g IV every 24 hours.  Additional comments:   Thank you for allowing pharmacy to be a part of this patient's care.  Olivia Canter, [facility] 04/12/2019 1:59 PM

## 2019-04-12 NOTE — Progress Notes (Signed)
MD notified: Micheal [name], the patient still complains of burning when urinating.

## 2019-04-12 NOTE — Progress Notes (Signed)
PROGRESS NOTE  KAMAHAO STEFFENSMEIER [id number] DOB: 12/21/1932 DOA: 03/25/2019 PCP: Tennis Must, MD  Brief History   The patient is an 85 yr old man with a medical history significant for COPD, CAD, DM II, GERD, hypertension, hyperlipidemia, tobacco abuse. He presented to [facility] ED on 03/26/2019 from home with complaint of abdominal pain, nausea, and vomiting. It had been 6 days since his last BM. CT of the abdomen demonstrated a high grade wmall bowel obstruction with transition point in the central lower abdomen potentially due to an adhesion.   The patient was admitted to a general surgery service. The hospitalist service was consulted for comanagement of medical issues. The patient was taken to the OR on 03/30/2019 for exploratory laparotomy with adhesiolysis after conservative measures failed to resolve the problem. The patient's hospital course have been complicated by an ICU admission for worsening clinical picture and singles. In the last 24 hours the patient has developed coarse upper airway sounds. However he has had a BM and return of bowel function. NGT has been removed. He has had 3 BM's in the last 24 hours. The patient is now on a regular diet. His oral intake remains low. The patient remains on TPN per surgery.  Consultants  . [facility]  Procedures  Exploratory laparotomy with adhesiolysis on 03/30/2019  Antibiotics   Anti-infectives (From admission, onward)   Start     Dose/Rate Route Frequency Ordered Stop   04/08/19 1000  ceFEPIme (MAXIPIME) 2 g in sodium chloride 0.9 % 100 mL IVPB     2 g 200 mL/hr over 30 Minutes Intravenous Every 12 hours 04/08/19 0916     04/07/19 1700  piperacillin-tazobactam (ZOSYN) IVPB 3.375 g  Status:  Discontinued     3.375 g 12.5 mL/hr over 240 Minutes Intravenous Every 8 hours 04/07/19 1520 04/08/19 0847   04/05/19 1730  valACYclovir (VALTREX) tablet 1,000 mg     1,000 mg Oral Daily 04/05/19 1724 04/11/19 0907   04/05/19 1715  valACYclovir  (VALTREX) tablet 1,000 mg  Status:  Discontinued     1,000 mg Oral Daily 04/05/19 1714 04/05/19 1724   04/03/19 2200  metroNIDAZOLE (FLAGYL) IVPB 500 mg  Status:  Discontinued     500 mg 100 mL/hr over 60 Minutes Intravenous Every 8 hours 04/03/19 1234 04/06/19 1155   04/03/19 1800  azithromycin (ZITHROMAX) 500 mg in sodium chloride 0.9 % 250 mL IVPB  Status:  Discontinued     500 mg 250 mL/hr over 60 Minutes Intravenous Every 24 hours 04/03/19 1234 04/07/19 1520   04/03/19 1400  ceFEPIme (MAXIPIME) 2 g in sodium chloride 0.9 % 100 mL IVPB  Status:  Discontinued     2 g 200 mL/hr over 30 Minutes Intravenous Every 12 hours 04/03/19 1234 04/07/19 1520   04/02/19 0900  Ampicillin-Sulbactam (UNASYN) 3 g in sodium chloride 0.9 % 100 mL IVPB  Status:  Discontinued     3 g 200 mL/hr over 30 Minutes Intravenous Every 6 hours 04/02/19 0756 04/03/19 1234   03/30/19 1600  ertapenem (INVANZ) 1,000 mg in sodium chloride 0.9 % 100 mL IVPB     1 g 200 mL/hr over 30 Minutes Intravenous On call to O.R. 03/30/19 1558 03/30/19 1642     Subjective  The patient is resting comfortably. No new complaints.   Objective   Vitals:  Vitals:   04/12/19 0408 04/12/19 1144  BP: (!) 128/58 (!) 131/57  Pulse: (!) 106 (!) 101  Resp: 20 18  Temp: Marland Kitchen)  97.5 F (36.4 C) 98.2 F (36.8 C)  SpO2: 95% 99%   Exam:  Constitutional:  . The patient is awake, alert, and oriented x 3. No acute distress. Respiratory:  . No increased work of breathing. . No wheezes, rales, or rhonchi . No tactile fremitus Cardiovascular:  . Regular rate and rhythm . No murmurs, ectopy, or gallups. . No lateral PMI. No thrills. Abdomen:  . Abdomen is soft, non-tender, non-distended . No hernias, masses, or organomegaly . Normoactive bowel sounds.  . Surgical site is clean and dry. Musculoskeletal:  . No cyanosis, clubbing, or edema Skin:  . No rashes, lesions, ulcers . palpation of skin: no induration or nodules Neurologic:   . CN 2-12 intact . Sensation all 4 extremities intact Psychiatric:  . Mental status o Mood, affect appropriate o Orientation to person, place, time  . judgment and insight appear intact  I have personally reviewed the following:   Today's Data  . Vitals, I's and O's, BMP, CBC  Micro Data  . Pulmonary aspirate was positive for pseudomonas aeruginosa 04/05/2019.  Imaging  . Abdominal film 04/07/2019: Ileus . CXR 04/02/2019: Progressive bibasilar infiltrates. And fractured 9th rib.  Scheduled Meds: . Chlorhexidine Gluconate Cloth  6 each Topical Daily  . enoxaparin (LOVENOX) injection  40 mg Subcutaneous Q24H  . feeding supplement (ENSURE ENLIVE)  237 mL Oral TID BM  . insulin aspart  0-15 Units Subcutaneous Q4H  . mouth rinse  15 mL Mouth Rinse BID  . metoprolol tartrate  5 mg Intravenous Q6H  . mometasone-formoterol  2 puff Inhalation BID  . nicotine  14 mg Transdermal Daily  . pantoprazole (PROTONIX) IV  40 mg Intravenous QHS  . phenazopyridine  100 mg Oral TID WC  . polyvinyl alcohol  1 drop Both Eyes BID  . tamsulosin  0.4 mg Oral Daily   Continuous Infusions: . Marland KitchenTPN (CLINIMIX-E) Adult 40 mL/hr at 04/11/19 2013  . sodium chloride 100 mL (04/12/19 0829)  . ceFEPime (MAXIPIME) IV 2 g (04/12/19 [id number])    Principal Problem:   SBO (small bowel obstruction) (HCC) Active Problems:   Type 2 diabetes mellitus, uncontrolled, with renal complications (HCC)   COPD (chronic obstructive pulmonary disease) (HCC)   Gastroesophageal reflux disease without esophagitis   HTN (hypertension)   Smoker   Acute renal failure superimposed on stage 3b chronic kidney disease (HCC)   Hyponatremia   Leucocytosis   LOS: 18 days   A & P  Sepsis suspect secondary to Pseudomonas pneumonia/aspiration pneumonia:  Leukocytosis is trending down 18.4K to 14.6 today. Procalcitonin 0.21 on 04/05/19 from  0.4 on 04/04/19 from 1.28 on 04/02/2019. Continue with cefepime 2 gm Q 12 hours. IV  azithromycin stopped after 04/06/2019.  Sputum culture grew Pseudomonas, continue to follow cultures for sensitivities. Sensitivity to Cefepime. Blood cultures have had no growth to date. Continue to Monitor fever curve and WBC. Patient is afebrile. Monitor leukocytosis.  Acute hypoxic respiratory failure suspect secondary to Pseudomonas/aspiration pneumonia: Not on oxygen supplementation at baseline. He is currently saturating 95% on room air. Will discontinuoud continuous pulse oximetry. Continue bronchodilators. Patient refuses any further suctioning per RT. Coarse airway sounds have cleared.  Suspected zoster: Left buttock and lateral left lower extremity. The patient is receiving valtrex. Wound care following. No history of zoster vaccination.  Post op s/p exploratory laparotomy and lysis of adhesions: Management per primary service. NGT out today. TPN continued. The patient is a clear liquid diet. Tolerating well. 3 BM's yesterday.  AKI:  Resolved. The patient's baseline creatinine appears to be 1.0 with GFR greater than 60. He presented with creatinine 2.1 with GFR of 27. Today's creatinine is 1.49. Monitor creatinine, volume status, and electrolytes. Avoid nephrotoxins and hypotension.   Hypovolemic hyponatremia: Na+ 133. Managed with TPN. Follow chemistry.  Small bilateral pleural effusions: On antibiotic cefepime continue. Follow CXR and volume status.  Severe protein calorie malnutrition: On TPN managed by pharmacy. Albumin of 2.1 with BMI of 21 with muscle loss. Monitor. Diet is now regular, but patient's oral intake remains poor.  Hypophosphatemia: resolved.  Right eighth rib fracture, incidentally found: Continue close monitoring and address as indicated.  DM II: Hemoglobin A1c 8.3. Glucoses are followed with FSBS and SSI. FSBS over the past 24 hours have been 118-220. Continue to hold Home meds include glipizide and Metformin.   Essential hypertension: Blood pressures  are well controlled with metoprolol only now. Monitor and add back lisinopril if needed.   Hyperlipidemia: Continue statin as at home.  CAD: Continue Statin, ASA, and metoprolol.   COPD: Noted and stable. Continue bronchodilators.  GERD:  Continue IV PPI  Current daily smoker: Tobacco cessation counseling at bedside. Nicotine patch is available.  BPH: Flomax on hold due to n.p.o.  Monitor urine output.  I have seen and examined this patient myself. I have spent 30 minutes in his evaluation and care.  DVT prophylaxis:Heparin subcu 3 times daily Code Status:Code Status: Full Code Family communication: Wife at bedside.  Disposition: TBD.  Ellery Meroney, DO [facility] Direct contact: see [email or web address]  7PM-7AM contact night coverage as above 04/12/2019, 1:15 PM  LOS: 14 days

## 2019-04-13 ENCOUNTER — Inpatient Hospital Stay: Payer: Medicare Other

## 2019-04-13 LAB — BASIC METABOLIC PANEL
Anion gap: 8 (ref 5–15)
Anion gap: 7 (ref 5–15)
BUN: 49 mg/dL — ABNORMAL HIGH (ref 8–23)
BUN: 47 mg/dL — ABNORMAL HIGH (ref 8–23)
CO2: 26 mmol/L (ref 22–32)
CO2: 28 mmol/L (ref 22–32)
Calcium: 8.7 mg/dL — ABNORMAL LOW (ref 8.9–10.3)
Calcium: 8.5 mg/dL — ABNORMAL LOW (ref 8.9–10.3)
Chloride: 98 mmol/L (ref 98–111)
Chloride: 99 mmol/L (ref 98–111)
Creatinine, Ser: 1.72 mg/dL — ABNORMAL HIGH (ref 0.61–1.24)
Creatinine, Ser: 1.48 mg/dL — ABNORMAL HIGH (ref 0.61–1.24)
GFR calc Af Amer: 41 mL/min — ABNORMAL LOW (ref >60)
GFR calc Af Amer: 50 mL/min — ABNORMAL LOW (ref >60)
GFR calc non Af Amer: 36 mL/min — ABNORMAL LOW (ref >60)
GFR calc non Af Amer: 43 mL/min — ABNORMAL LOW (ref >60)
Glucose, Bld: 291 mg/dL — ABNORMAL HIGH (ref 70–99)
Glucose, Bld: 178 mg/dL — ABNORMAL HIGH (ref 70–99)
Potassium: 5.7 mmol/L — ABNORMAL HIGH (ref 3.5–5.1)
Potassium: 5.3 mmol/L — ABNORMAL HIGH (ref 3.5–5.1)
Sodium: 132 mmol/L — ABNORMAL LOW (ref 135–145)
Sodium: 134 mmol/L — ABNORMAL LOW (ref 135–145)

## 2019-04-13 LAB — CBC
HCT: 26.7 % — ABNORMAL LOW (ref 39.0–52.0)
Hemoglobin: 8.3 g/dL — ABNORMAL LOW (ref 13.0–17.0)
MCH: 29.2 pg (ref 26.0–34.0)
MCHC: 31.1 g/dL (ref 30.0–36.0)
MCV: 94 fL (ref 80.0–100.0)
Platelets: 268 10*3/uL (ref 150–400)
RBC: 2.84 MIL/uL — ABNORMAL LOW (ref 4.22–5.81)
RDW: 14.7 % (ref 11.5–15.5)
WBC: 16 10*3/uL — ABNORMAL HIGH (ref 4.0–10.5)
nRBC: 0 % (ref 0.0–0.2)

## 2019-04-13 LAB — COMPREHENSIVE METABOLIC PANEL
ALT: 25 U/L (ref 0–44)
AST: 25 U/L (ref 15–41)
Albumin: 2.2 g/dL — ABNORMAL LOW (ref 3.5–5.0)
Alkaline Phosphatase: 72 U/L (ref 38–126)
Anion gap: 7 (ref 5–15)
BUN: 49 mg/dL — ABNORMAL HIGH (ref 8–23)
CO2: 26 mmol/L (ref 22–32)
Calcium: 8.5 mg/dL — ABNORMAL LOW (ref 8.9–10.3)
Chloride: 101 mmol/L (ref 98–111)
Creatinine, Ser: 1.61 mg/dL — ABNORMAL HIGH (ref 0.61–1.24)
GFR calc Af Amer: 45 mL/min — ABNORMAL LOW (ref >60)
GFR calc non Af Amer: 39 mL/min — ABNORMAL LOW (ref >60)
Glucose, Bld: 189 mg/dL — ABNORMAL HIGH (ref 70–99)
Potassium: 5.8 mmol/L — ABNORMAL HIGH (ref 3.5–5.1)
Sodium: 134 mmol/L — ABNORMAL LOW (ref 135–145)
Total Bilirubin: 0.4 mg/dL (ref 0.3–1.2)
Total Protein: 5.9 g/dL — ABNORMAL LOW (ref 6.5–8.1)

## 2019-04-13 LAB — URINE CULTURE: Culture: NO GROWTH

## 2019-04-13 LAB — GLUCOSE, CAPILLARY
Glucose-Capillary: 266 mg/dL — ABNORMAL HIGH (ref 70–99)
Glucose-Capillary: 156 mg/dL — ABNORMAL HIGH (ref 70–99)
Glucose-Capillary: 169 mg/dL — ABNORMAL HIGH (ref 70–99)

## 2019-04-13 LAB — PHOSPHORUS: Phosphorus: 3.2 mg/dL (ref 2.5–4.6)

## 2019-04-13 LAB — MAGNESIUM: Magnesium: 1.9 mg/dL (ref 1.7–2.4)

## 2019-04-13 MED ORDER — CARVEDILOL 6.25 MG PO TABS
6.2500 mg | ORAL_TABLET | Freq: Two times a day (BID) | ORAL | Status: DC
  Administered 2019-04-13 – 2019-04-15 (×6): 6.25 mg via ORAL
  Filled 2019-04-13 (×6): qty 1

## 2019-04-13 MED ORDER — INSULIN ASPART 100 UNIT/ML ~~LOC~~ SOLN
0.0000 [IU] | Freq: Every day | SUBCUTANEOUS | Status: DC
  Administered 2019-04-14: 3 [IU] via SUBCUTANEOUS
  Filled 2019-04-13: qty 1

## 2019-04-13 MED ORDER — POLYETHYLENE GLYCOL 3350 17 G PO PACK
17.0000 g | PACK | Freq: Every day | ORAL | Status: DC
  Administered 2019-04-13 – 2019-04-15 (×3): 17 g via ORAL
  Filled 2019-04-13 (×3): qty 1

## 2019-04-13 MED ORDER — DEXTROSE 50 % IV SOLN
1.0000 | Freq: Once | INTRAVENOUS | Status: AC
  Administered 2019-04-13: 50 mL via INTRAVENOUS
  Filled 2019-04-13: qty 50

## 2019-04-13 MED ORDER — SODIUM CHLORIDE 0.9 % IV SOLN: INTRAVENOUS | Status: DC

## 2019-04-13 MED ORDER — METOPROLOL TARTRATE 5 MG/5ML IV SOLN: 5.0000 mg | Freq: Four times a day (QID) | INTRAVENOUS | Status: DC | PRN

## 2019-04-13 MED ORDER — CALCIUM GLUCONATE-NACL 1-0.675 GM/50ML-% IV SOLN
1.0000 g | Freq: Once | INTRAVENOUS | Status: AC
  Administered 2019-04-13: 1000 mg via INTRAVENOUS
  Filled 2019-04-13: qty 50

## 2019-04-13 MED ORDER — ADULT MULTIVITAMIN W/MINERALS CH
1.0000 | ORAL_TABLET | Freq: Every day | ORAL | Status: DC
  Administered 2019-04-14 – 2019-04-15 (×2): 1 via ORAL
  Filled 2019-04-13 (×2): qty 1

## 2019-04-13 MED ORDER — INSULIN ASPART 100 UNIT/ML ~~LOC~~ SOLN
10.0000 [IU] | Freq: Once | SUBCUTANEOUS | Status: AC
  Administered 2019-04-13: 10 [IU] via INTRAVENOUS
  Filled 2019-04-13: qty 1
  Filled 2019-04-13: qty 0.1

## 2019-04-13 MED ORDER — ASPIRIN EC 81 MG PO TBEC
81.0000 mg | DELAYED_RELEASE_TABLET | Freq: Every day | ORAL | Status: DC
  Administered 2019-04-13 – 2019-04-15 (×3): 81 mg via ORAL
  Filled 2019-04-13 (×3): qty 1

## 2019-04-13 MED ORDER — INSULIN ASPART 100 UNIT/ML ~~LOC~~ SOLN
0.0000 [IU] | Freq: Three times a day (TID) | SUBCUTANEOUS | Status: DC
  Administered 2019-04-13: 8 [IU] via SUBCUTANEOUS
  Administered 2019-04-13: 3 [IU] via SUBCUTANEOUS
  Administered 2019-04-14 (×2): 5 [IU] via SUBCUTANEOUS
  Administered 2019-04-14: 3 [IU] via SUBCUTANEOUS
  Administered 2019-04-15 (×3): 5 [IU] via SUBCUTANEOUS
  Filled 2019-04-13 (×8): qty 1

## 2019-04-13 MED ORDER — NEPRO/CARBSTEADY PO LIQD
237.0000 mL | Freq: Three times a day (TID) | ORAL | Status: DC
  Administered 2019-04-13 – 2019-04-15 (×6): 237 mL via ORAL

## 2019-04-13 MED ORDER — PANTOPRAZOLE SODIUM 40 MG PO TBEC
40.0000 mg | DELAYED_RELEASE_TABLET | Freq: Every day | ORAL | Status: DC
  Administered 2019-04-13 – 2019-04-14 (×2): 40 mg via ORAL
  Filled 2019-04-13 (×2): qty 1

## 2019-04-13 MED ORDER — DEXTROSE-NACL 5-0.45 % IV SOLN
INTRAVENOUS | Status: DC
  Administered 2019-04-13 – 2019-04-15 (×5): via INTRAVENOUS

## 2019-04-13 MED ORDER — INSULIN ASPART 100 UNIT/ML ~~LOC~~ SOLN: 10.0000 [IU] | Freq: Once | SUBCUTANEOUS | Status: DC

## 2019-04-13 NOTE — Progress Notes (Signed)
PT Cancellation Note  Patient Details Name: Micheal [name] MRN: [id number] DOB: May 16, 1933   Cancelled Treatment:    Reason Eval/Treat Not Completed: Medical issues which prohibited therapy; Pt's Ka 5.7 falling outside guidelines for participation with PT services.  Will attempt to see pt at a future date/time as medically appropriate.     Linus Salmons PT, DPT 04/13/19, 2:18 PM

## 2019-04-13 NOTE — Progress Notes (Signed)
PROGRESS NOTE  Micheal [name] [id number] DOB: 1932-10-25 DOA: 03/25/2019 PCP: Tennis Must, MD  Brief History   The patient is an 85 yr old man with a medical history significant for COPD, CAD, DM II, GERD, hypertension, hyperlipidemia, tobacco abuse. He presented to [facility] ED on 03/26/2019 from home with complaint of abdominal pain, nausea, and vomiting. It had been 6 days since his last BM. CT of the abdomen demonstrated a high grade wmall bowel obstruction with transition point in the central lower abdomen potentially due to an adhesion.   The patient was admitted to a general surgery service. The hospitalist service was consulted for comanagement of medical issues. The patient was taken to the OR on 03/30/2019 for exploratory laparotomy with adhesiolysis after conservative measures failed to resolve the problem. The patient's hospital course have been complicated by an ICU admission for worsening clinical picture and singles. In the last 24 hours the patient has developed coarse upper airway sounds. However he has had a BM and return of bowel function. NGT has been removed. He has had 3 BM's in the last 24 hours. The patient is now on a regular diet. His oral intake remains low. TPN stopped on 04/12/2019.  Consultants  . [facility]  Procedures  Exploratory laparotomy with adhesiolysis on 03/30/2019  Antibiotics   Anti-infectives (From admission, onward)   Start     Dose/Rate Route Frequency Ordered Stop   04/13/19 1000  ceFEPIme (MAXIPIME) 2 g in sodium chloride 0.9 % 100 mL IVPB     2 g 200 mL/hr over 30 Minutes Intravenous Every 24 hours 04/12/19 1358     04/08/19 1000  ceFEPIme (MAXIPIME) 2 g in sodium chloride 0.9 % 100 mL IVPB  Status:  Discontinued     2 g 200 mL/hr over 30 Minutes Intravenous Every 12 hours 04/08/19 0916 04/12/19 1358   04/07/19 1700  piperacillin-tazobactam (ZOSYN) IVPB 3.375 g  Status:  Discontinued     3.375 g 12.5 mL/hr over 240 Minutes Intravenous  Every 8 hours 04/07/19 1520 04/08/19 0847   04/05/19 1730  valACYclovir (VALTREX) tablet 1,000 mg     1,000 mg Oral Daily 04/05/19 1724 04/11/19 0907   04/05/19 1715  valACYclovir (VALTREX) tablet 1,000 mg  Status:  Discontinued     1,000 mg Oral Daily 04/05/19 1714 04/05/19 1724   04/03/19 2200  metroNIDAZOLE (FLAGYL) IVPB 500 mg  Status:  Discontinued     500 mg 100 mL/hr over 60 Minutes Intravenous Every 8 hours 04/03/19 1234 04/06/19 1155   04/03/19 1800  azithromycin (ZITHROMAX) 500 mg in sodium chloride 0.9 % 250 mL IVPB  Status:  Discontinued     500 mg 250 mL/hr over 60 Minutes Intravenous Every 24 hours 04/03/19 1234 04/07/19 1520   04/03/19 1400  ceFEPIme (MAXIPIME) 2 g in sodium chloride 0.9 % 100 mL IVPB  Status:  Discontinued     2 g 200 mL/hr over 30 Minutes Intravenous Every 12 hours 04/03/19 1234 04/07/19 1520   04/02/19 0900  Ampicillin-Sulbactam (UNASYN) 3 g in sodium chloride 0.9 % 100 mL IVPB  Status:  Discontinued     3 g 200 mL/hr over 30 Minutes Intravenous Every 6 hours 04/02/19 0756 04/03/19 1234   03/30/19 1600  ertapenem (INVANZ) 1,000 mg in sodium chloride 0.9 % 100 mL IVPB     1 g 200 mL/hr over 30 Minutes Intravenous On call to O.R. 03/30/19 1558 03/30/19 1642     Subjective  The patient is  resting comfortably. No new complaints.   Objective   Vitals:  Vitals:   04/13/19 0602 04/13/19 1157  BP: (!) 114/57 (!) 123/57  Pulse: 99 86  Resp: 20 18  Temp: 97.8 F (36.6 C) (!) 97.4 F (36.3 C)  SpO2: 92% 96%   Exam:  Constitutional:  . The patient is awake, alert, and oriented x 3. No acute distress. Respiratory:  . No increased work of breathing. . No wheezes, rales, or rhonchi . No tactile fremitus Cardiovascular:  . Regular rate and rhythm . No murmurs, ectopy, or gallups. . No lateral PMI. No thrills. Abdomen:  . Abdomen is soft, non-tender, non-distended . No hernias, masses, or organomegaly . Normoactive bowel sounds.  . Surgical site  is clean and dry. Musculoskeletal:  . No cyanosis, clubbing, or edema Skin:  . No rashes, lesions, ulcers . palpation of skin: no induration or nodules Neurologic:  . CN 2-12 intact . Sensation all 4 extremities intact Psychiatric:  . Mental status o Mood, affect appropriate o Orientation to person, place, time  . judgment and insight appear intact  I have personally reviewed the following:   Today's Data  . Vitals, I's and O's, BMP  Micro Data  . Pulmonary aspirate was positive for pseudomonas aeruginosa 04/05/2019.  Imaging  . Abdominal film 04/07/2019: Ileus . CXR 04/02/2019: Progressive bibasilar infiltrates. And fractured 9th rib.  Scheduled Meds: . aspirin EC  81 mg Oral Daily  . carvedilol  6.25 mg Oral BID WC  . Chlorhexidine Gluconate Cloth  6 each Topical Daily  . enoxaparin (LOVENOX) injection  30 mg Subcutaneous Q24H  . feeding supplement (NEPRO CARB STEADY)  237 mL Oral TID BM  . insulin aspart  0-15 Units Subcutaneous TID WC  . insulin aspart  0-5 Units Subcutaneous QHS  . mouth rinse  15 mL Mouth Rinse BID  . mometasone-formoterol  2 puff Inhalation BID  . [START ON 04/14/2019] multivitamin with minerals  1 tablet Oral Daily  . nicotine  14 mg Transdermal Daily  . pantoprazole  40 mg Oral QHS  . polyethylene glycol  17 g Oral Daily  . polyvinyl alcohol  1 drop Both Eyes BID  . tamsulosin  0.4 mg Oral Daily   Continuous Infusions: . sodium chloride Stopped (04/13/19 1020)  . ceFEPime (MAXIPIME) IV Stopped (04/13/19 1016)  . dextrose 5 % and 0.45% NaCl 100 mL/hr at 04/13/19 1723    Principal Problem:   SBO (small bowel obstruction) (HCC) Active Problems:   Type 2 diabetes mellitus, uncontrolled, with renal complications (HCC)   COPD (chronic obstructive pulmonary disease) (HCC)   Gastroesophageal reflux disease without esophagitis   HTN (hypertension)   Smoker   Acute renal failure superimposed on stage 3b chronic kidney disease (HCC)    Hyponatremia   Leucocytosis   LOS: 19 days   A & P  Sepsis suspect secondary to Pseudomonas pneumonia/aspiration pneumonia:  Leukocytosis is trending down 18.4K to 14.6 today. Procalcitonin 0.21 on 04/05/19 from  0.4 on 04/04/19 from 1.28 on 04/02/2019. Continue with cefepime 2 gm Q 12 hours. IV azithromycin stopped after 04/06/2019.  Sputum culture grew Pseudomonas, continue to follow cultures for sensitivities. Sensitivity to Cefepime. Blood cultures have had no growth to date. Continue to Monitor fever curve and WBC. Patient is afebrile. Monitor leukocytosis.  Acute hypoxic respiratory failure suspect secondary to Pseudomonas/aspiration pneumonia: Not on oxygen supplementation at baseline. He is currently saturating 95% on room air. Will discontinuoud continuous pulse oximetry. Continue bronchodilators. Patient  refuses any further suctioning per RT. Coarse airway sounds have cleared.  Suspected zoster: Left buttock and lateral left lower extremity. The patient is receiving valtrex. Wound care following. No history of zoster vaccination.  Post op s/p exploratory laparotomy and lysis of adhesions: Management per primary service. NGT out today. TPN continued. The patient is a clear liquid diet. Tolerating well. 3 BM's yesterday.  AKI: Resolved. The patient's baseline creatinine appears to be 1.0 with GFR greater than 60. He presented with creatinine 2.1 with GFR of 27. Today's creatinine is 1.67.Perhaps this is increased due to cessation of clinimix. Will give IV NS. Monitor.  Hyperkalemia: Related to AKI?   Hypovolemic hyponatremia: NA 132 today. Monitor.  Dehydration: BUN 49, Creatinine 1.72. IV fluids.  Small bilateral pleural effusions: On antibiotic cefepime continue. Follow CXR and volume status.  Severe protein calorie malnutrition: On TPN managed by pharmacy. Albumin of 2.1 with BMI of 21 with muscle loss. Monitor. Diet is now regular, but patient's oral intake remains poor.   Hypophosphatemia: resolved.  Right eighth rib fracture, incidentally found: Continue close monitoring and address as indicated.  DM II: Hemoglobin A1c 8.3. Glucoses are followed with FSBS and SSI. FSBS over the past 24 hours have been 118-220. Continue to hold Home meds include glipizide and Metformin.   Essential hypertension: Blood pressures are well controlled with metoprolol only now. Monitor and add back lisinopril if needed.   Hyperlipidemia: Continue statin as at home.  CAD: Continue Statin, ASA, and metoprolol.   COPD: Noted and stable. Continue bronchodilators.  GERD:  Continue IV PPI  Current daily smoker: Tobacco cessation counseling at bedside. Nicotine patch is available.  BPH: Flomax on hold due to n.p.o.  Monitor urine output.  I have seen and examined this patient myself. I have spent 32 minutes in his evaluation and care.  DVT prophylaxis:Heparin subcu 3 times daily Code Status:Code Status: Full Code Family communication: Wife at bedside.  Disposition: TBD.  Tyshawn Ciullo, DO [facility] Direct contact: see [email or web address]  7PM-7AM contact night coverage as above 04/13/2019, 6:43 PM  LOS: 14 days

## 2019-04-13 NOTE — Progress Notes (Addendum)
Nutrition Follow-up  DOCUMENTATION CODES:   Not applicable  INTERVENTION:   Nepro Shake po TID, each supplement provides 425 kcal and 19 grams protein  MVI daily   NUTRITION DIAGNOSIS:   Inadequate oral intake related to acute illness(SBO) as evidenced by NPO status. -resolving   GOAL:   Patient will meet greater than or equal to 90% of their needs Previously met with TPN  MONITOR:   PO intake, Supplement acceptance, Labs, Weight trends, Skin, I & O's  ASSESSMENT:   85 yo male Adm for SBO. PMH significant of DM, COPD, CAD, GERD, MI(2005), HTN, Hypercholestemia.   Spoke with pt via phone. Pt reports improving appetite and oral intake. Pt advanced to a regular diet 11/21; pt eating 50-100% of meals and drinking Ensure. Pt reports eating a bowl of cereal and milk for breakfast today along with an Ensure. Pt is planning to eat a [employer] cheeseburger at lunch that his wife is bringing in. TPN discontinued yesterday. Refeed labs stable. Pt continues to have hyperglycemia. Pt's weights have stabilized. Will add MVI in setting of etoh abuse. Will change Ensure to Nepro in setting of hyperkalemia.    Medications reviewed and include: aspirin, lovenox, insulin, protonix, nicotine, miralax, cefepime  Labs reviewed: Na 134(L), K 5.8(H), BUN 49(H), creat 1.61(H), P 3.2 wnl, Mg 1.9 wnl Wbc- 16.0(H), Hgb 8.3(L), Hct 26.7(L) cbgs- 220, 118, 214, 170, 160 x 24 hrs  Diet Order:   Diet Order            Diet regular Room service appropriate? Yes; Fluid consistency: Thin  Diet effective now             EDUCATION NEEDS:   Education needs have been addressed  Skin:  Skin Assessment: Skin Integrity Issues: Skin Integrity Issues:: Incisions Incisions: abdomen  Last BM:  11/22- type 6  Height:   Ht Readings from Last 1 Encounters:  04/02/19 _0  (1.549 m)    Weight:   Wt Readings from Last 1 Encounters:  04/13/19 60 kg    Ideal Body Weight:  67.3 kg  BMI:  Body mass  index is 24.99 kg/m.  Estimated Nutritional Needs:   Kcal:  1700-1900  Protein:  85-95 grams  Fluid:  1.6-1.8 L  Koleen Distance MS, RD, LDN Pager #- [phone] Office#- [phone] After Hours Pager: [phone]

## 2019-04-13 NOTE — Progress Notes (Signed)
04/13/2019  Subjective: Patient is 14 Days Post-Op s/p exlap and LOA for small bowel obstruction.  Had prolonged ileus and over the past few days, his NG was removed, and his diet was advanced.  He had CXR and KUB this morning which do not show any further dilated loops of bowel and his CXR does not show any new infiltrates or edema.  He continues having bowel function and had BM yesterday.  Denies any nausea or distention.  Does report having dysuria, but had recent negative U/A and was started on pyridium yesterday.  Today, his creatinine and BUN are more elevated, in the setting of hyperkalemia.  Was given D50/insulin iv and calcium gluconate this morning by Dr. Dahlia Byes.  Vital signs: Temp:  [97.8 F (36.6 C)-98.3 F (36.8 C)] 97.8 F (36.6 C) (11/23 0602) Pulse Rate:  [99-103] 99 (11/23 0602) Resp:  [18-20] 20 (11/23 0602) BP: (114-131)/(53-57) 114/57 (11/23 0602) SpO2:  [92 %-99 %] 92 % (11/23 0602) Weight:  [60 kg] 60 kg (11/23 0448)   Intake/Output: 11/22 0701 - 11/23 0700 In: 1522.9 [P.O.:477; I.V.:1045.9] Out: 2500 [Urine:2500] Last BM Date: 04/12/19  Physical Exam: Constitutional: No acute distress Abdomen:  Soft, non-distended (patient reports is his baseline), and non-tender to palpation.  Incision is clean, dry, with staples in place. GU:  Condom cath in place, with yellow urine Skin:  Two areas of shingles lesions are now scabbed over without any other lesions or blisters.  Labs:  Recent Labs    04/12/19 0636 04/13/19 0432  WBC 15.1* 16.0*  HGB 8.6* 8.3*  HCT 26.5* 26.7*  PLT 263 268   Recent Labs    04/13/19 0432 04/13/19 0941  NA 134* 132*  K 5.8* 5.7*  CL 101 98  CO2 26 26  GLUCOSE 189* 291*  BUN 49* 49*  CREATININE 1.61* 1.72*  CALCIUM 8.5* 8.7*   No results for input(s): LABPROT, INR in the last 72 hours.  Imaging: Dg Abd Acute 2+v W 1v Chest  Result Date: 04/13/2019 CLINICAL DATA:  Ileus EXAM: DG ABDOMEN ACUTE W/ 1V CHEST COMPARISON:   April 07, 2019 FINDINGS: Right PICC line overlies the SVC. Enteric tube is no longer present. Persistent trace bilateral pleural effusions. No new consolidation or edema. Decreased gaseous distention of bowel. Moderate stool burden throughout the colon. IMPRESSION: Improved gaseous distention of bowel. Electronically Signed   By: Macy Mis M.D.   On: 04/13/2019 08:15    Assessment/Plan: This is a 85 y.o. male s/p exlap and LOA.  --Will continue regular diet today.  His TPN is now off and is on po supplements for nutrition. --KUB without any further dilated loops of bowel, but has stool burden in colon.  Will add MiraLax daily to help with a bowel regimen. --Shingles lesions have scabbed now.  Valtrex course completed.  Discussed with his RN and she will contact infection prevention about discontinuing contact precautions. --Discussed with Dr. Benny Lennert about his hyperkalemia.  Has IV fluids ordered now and will repeat BMP today.  Appreciate her help with his medical management. --Continue Flomax for his BPH.  At this point, would hold off on further pyridium as his renal function is somewhat worse today.  His U/A and culture on 11/21 was negative. --Continue DVT/GI prophylaxis --Continue PT/ambulation as tolerated. --Depending on his renal function and electrolytes, may be close to discharge in the next 24-48 hours.     Melvyn Neth, [facility]

## 2019-04-14 LAB — CBC WITH DIFFERENTIAL/PLATELET
Abs Immature Granulocytes: 0.27 10*3/uL — ABNORMAL HIGH (ref 0.00–0.07)
Basophils Absolute: 0.1 10*3/uL (ref 0.0–0.1)
Basophils Relative: 0 %
Eosinophils Absolute: 0.9 10*3/uL — ABNORMAL HIGH (ref 0.0–0.5)
Eosinophils Relative: 7 %
HCT: 25.7 % — ABNORMAL LOW (ref 39.0–52.0)
Hemoglobin: 8.4 g/dL — ABNORMAL LOW (ref 13.0–17.0)
Immature Granulocytes: 2 %
Lymphocytes Relative: 10 %
Lymphs Abs: 1.3 10*3/uL (ref 0.7–4.0)
MCH: 29.8 pg (ref 26.0–34.0)
MCHC: 32.7 g/dL (ref 30.0–36.0)
MCV: 91.1 fL (ref 80.0–100.0)
Monocytes Absolute: 0.9 10*3/uL (ref 0.1–1.0)
Monocytes Relative: 7 %
Neutro Abs: 9.8 10*3/uL — ABNORMAL HIGH (ref 1.7–7.7)
Neutrophils Relative %: 74 %
Platelets: 262 10*3/uL (ref 150–400)
RBC: 2.82 MIL/uL — ABNORMAL LOW (ref 4.22–5.81)
RDW: 14.7 % (ref 11.5–15.5)
WBC: 13.2 10*3/uL — ABNORMAL HIGH (ref 4.0–10.5)
nRBC: 0 % (ref 0.0–0.2)

## 2019-04-14 LAB — URINALYSIS, ROUTINE W REFLEX MICROSCOPIC
Bacteria, UA: NONE SEEN
Bilirubin Urine: NEGATIVE
Glucose, UA: 150 mg/dL — AB
Hgb urine dipstick: NEGATIVE
Ketones, ur: NEGATIVE mg/dL
Leukocytes,Ua: NEGATIVE
Nitrite: POSITIVE — AB
Protein, ur: 30 mg/dL — AB
Specific Gravity, Urine: 1.01 (ref 1.005–1.030)
Squamous Epithelial / HPF: NONE SEEN (ref 0–5)
pH: 6 (ref 5.0–8.0)

## 2019-04-14 LAB — BASIC METABOLIC PANEL
Anion gap: 6 (ref 5–15)
BUN: 46 mg/dL — ABNORMAL HIGH (ref 8–23)
CO2: 29 mmol/L (ref 22–32)
Calcium: 8.4 mg/dL — ABNORMAL LOW (ref 8.9–10.3)
Chloride: 97 mmol/L — ABNORMAL LOW (ref 98–111)
Creatinine, Ser: 1.39 mg/dL — ABNORMAL HIGH (ref 0.61–1.24)
GFR calc Af Amer: 54 mL/min — ABNORMAL LOW (ref >60)
GFR calc non Af Amer: 46 mL/min — ABNORMAL LOW (ref >60)
Glucose, Bld: 274 mg/dL — ABNORMAL HIGH (ref 70–99)
Potassium: 5.1 mmol/L (ref 3.5–5.1)
Sodium: 132 mmol/L — ABNORMAL LOW (ref 135–145)

## 2019-04-14 LAB — GLUCOSE, CAPILLARY
Glucose-Capillary: 228 mg/dL — ABNORMAL HIGH (ref 70–99)
Glucose-Capillary: 199 mg/dL — ABNORMAL HIGH (ref 70–99)
Glucose-Capillary: 217 mg/dL — ABNORMAL HIGH (ref 70–99)
Glucose-Capillary: 254 mg/dL — ABNORMAL HIGH (ref 70–99)

## 2019-04-14 MED ORDER — BELLADONNA ALKALOIDS-OPIUM 16.2-60 MG RE SUPP
0.5000 | Freq: Once | RECTAL | Status: AC
  Administered 2019-04-14: 0.5 via RECTAL
  Filled 2019-04-14: qty 1

## 2019-04-14 MED ORDER — PHENAZOPYRIDINE HCL 100 MG PO TABS: 100.0000 mg | ORAL_TABLET | Freq: Once | ORAL | Status: DC

## 2019-04-14 MED ORDER — ENOXAPARIN SODIUM 40 MG/0.4ML ~~LOC~~ SOLN
40.0000 mg | SUBCUTANEOUS | Status: DC
  Administered 2019-04-14: 40 mg via SUBCUTANEOUS
  Filled 2019-04-14: qty 0.4

## 2019-04-14 NOTE — Progress Notes (Signed)
[place] Hospital Day(s): 20.   Post op day(s): 15 Days Post-Op.   Interval History:  Patient seen and examined,  no acute events or new complaints overnight.  Patient reports he is feeling better this morning. Incisional soreness improving, no nausea, emesis, or distension. Still passing flatus and having BM He does endorse continue dysuria although U/A a few days ago was without evidence of infection Still with productive cough but he is able to better manage sputum Renal function baseline, hyperkalemia improving, now 5.1, U/O 2.1L Leukocytosis improving 13K, on Cefepime (Day 12 of IV Abx) PT following but unable to work with him secondary to electrolyte derangement   Vital signs in last 24 hours: [min-max] current  Temp:  [97.4 F (36.3 C)-97.7 F (36.5 C)] 97.5 F (36.4 C) (11/24 0549) Pulse Rate:  [80-88] 88 (11/24 0838) Resp:  [18-24] 24 (11/24 0549) BP: (123-132)/(55-62) 132/61 (11/24 0838) SpO2:  [95 %-99 %] 97 % (11/24 0838) Weight:  [63.5 kg] 63.5 kg (11/24 0500)     Height: 5\' 1"  (154.9 cm) Weight: 63.5 kg BMI (Calculated): 26.47   Intake/Output last 2 shifts:  11/23 0701 - 11/24 0700 In: 1433.8 [P.O.:420; I.V.:913.8; IV Piggyback:100] Out: 2100 [Urine:2100]   Physical Exam:  Constitutional: alert, cooperative and no distress  Respiratory: breathing non-labored at rest  Cardiovascular: regular rate and sinus rhythm  Gastrointestinal: soft, non-tender, non-distended, no rebound/guarding.No peritonitis  Integumentary: Laparotomy incision is CDI with staples, no erythema or drainage. Vesicle lesion on LLE are crusted over  Labs:  CBC Latest Ref Rng & Units 04/14/2019 04/13/2019 04/12/2019  WBC 4.0 - 10.5 K/uL 13.2(H) 16.0(H) 15.1(H)  Hemoglobin 13.0 - 17.0 g/dL 8.4(L) 8.3(L) 8.6(L)  Hematocrit 39.0 - 52.0 % 25.7(L) 26.7(L) 26.5(L)  Platelets 150 - 400 K/uL 262 268 263   CMP Latest Ref Rng & Units 04/14/2019  04/13/2019 04/13/2019  Glucose 70 - 99 mg/dL 274(H) 178(H) 291(H)  BUN 8 - 23 mg/dL 46(H) 47(H) 49(H)  Creatinine 0.61 - 1.24 mg/dL 1.39(H) 1.48(H) 1.72(H)  Sodium 135 - 145 mmol/L 132(L) 134(L) 132(L)  Potassium 3.5 - 5.1 mmol/L 5.1 5.3(H) 5.7(H)  Chloride 98 - 111 mmol/L 97(L) 99 98  CO2 22 - 32 mmol/L 29 28 26   Calcium 8.9 - 10.3 mg/dL 8.4(L) 8.5(L) 8.7(L)  Total Protein 6.5 - 8.1 g/dL - - -  Total Bilirubin 0.3 - 1.2 mg/dL - - -  Alkaline Phos 38 - 126 U/L - - -  AST 15 - 41 U/L - - -  ALT 0 - 44 U/L - - -    Imaging studies: No new pertinent imaging studies   Assessment/Plan: 85 y.o. male with improving electrolyte derangement and leukocytosis otherwise doing well from a surgical standpoint 15 Days Post-Op s/p exploratory laparotomy and lysis of adhesionsfor small bowel obstruction   - Continue regular diet    - ABx per medicine service  - Continue IVF resuscitation and K+ correction; monitor  - pain control prn; antiemetics prn  - monitor abdominal examiantion  - Will obtain U/A to ensure no UTI  - Staples out  - Continue mobilization: PT following  - medical management of comorbidities per medicine; appreciate their assistance    - Discharge planning: Hopefully home in 24-48 hours pending medicine clearance  All of the above findings and recommendations were discussed with the patient, and the medical team, and all of patient's questions were answered to his expressed satisfaction.  -- Micheal Simon, PA-C [facility] 04/14/2019, 8:41 AM [phone] M-F: 7am - 4pm

## 2019-04-14 NOTE — Care Management Important Message (Signed)
Important Message  Patient Details  Name: Micheal [name] MRN: [id number] Date of Birth: 24-Nov-1932   Medicare Important Message Given:  Yes     Dannette Barbara 04/14/2019, 11:32 AM

## 2019-04-14 NOTE — Progress Notes (Signed)
PROGRESS NOTE  Micheal [name] [id number] DOB: [birth year] DOA: 03/25/2019 PCP: Tennis Must, MD  Brief History   The patient is an 85 yr old man with a medical history significant for COPD, CAD, DM II, GERD, hypertension, hyperlipidemia, tobacco abuse. He presented to [facility] ED on 03/26/2019 from home with complaint of abdominal pain, nausea, and vomiting. It had been 6 days since his last BM. CT of the abdomen demonstrated a high grade wmall bowel obstruction with transition point in the central lower abdomen potentially due to an adhesion.   The patient was admitted to a general surgery service. The hospitalist service was consulted for comanagement of medical issues. The patient was taken to the OR on 03/30/2019 for exploratory laparotomy with adhesiolysis after conservative measures failed to resolve the problem. The patient's hospital course have been complicated by an ICU admission for worsening clinical picture and singles. In the last 24 hours the patient has developed coarse upper airway sounds. However he has had a BM and return of bowel function. NGT has been removed. He has had 3 BM's in the last 24 hours. The patient is now on a regular diet. His oral intake remains low. TPN stopped on 04/12/2019. Creatinine increased as did potassium. They have come down when IV fluids started. Oral intake of fluids has been encouraged.  Consultants  . [facility]  Procedures  Exploratory laparotomy with adhesiolysis on 03/30/2019  Antibiotics   Anti-infectives (From admission, onward)   Start     Dose/Rate Route Frequency Ordered Stop   04/13/19 1000  ceFEPIme (MAXIPIME) 2 g in sodium chloride 0.9 % 100 mL IVPB  Status:  Discontinued     2 g 200 mL/hr over 30 Minutes Intravenous Every 24 hours 04/12/19 1358 04/14/19 0815   04/08/19 1000  ceFEPIme (MAXIPIME) 2 g in sodium chloride 0.9 % 100 mL IVPB  Status:  Discontinued     2 g 200 mL/hr over 30 Minutes Intravenous Every 12 hours 04/08/19  0916 04/12/19 1358   04/07/19 1700  piperacillin-tazobactam (ZOSYN) IVPB 3.375 g  Status:  Discontinued     3.375 g 12.5 mL/hr over 240 Minutes Intravenous Every 8 hours 04/07/19 1520 04/08/19 0847   04/05/19 1730  valACYclovir (VALTREX) tablet 1,000 mg     1,000 mg Oral Daily 04/05/19 1724 04/11/19 0907   04/05/19 1715  valACYclovir (VALTREX) tablet 1,000 mg  Status:  Discontinued     1,000 mg Oral Daily 04/05/19 1714 04/05/19 1724   04/03/19 2200  metroNIDAZOLE (FLAGYL) IVPB 500 mg  Status:  Discontinued     500 mg 100 mL/hr over 60 Minutes Intravenous Every 8 hours 04/03/19 1234 04/06/19 1155   04/03/19 1800  azithromycin (ZITHROMAX) 500 mg in sodium chloride 0.9 % 250 mL IVPB  Status:  Discontinued     500 mg 250 mL/hr over 60 Minutes Intravenous Every 24 hours 04/03/19 1234 04/07/19 1520   04/03/19 1400  ceFEPIme (MAXIPIME) 2 g in sodium chloride 0.9 % 100 mL IVPB  Status:  Discontinued     2 g 200 mL/hr over 30 Minutes Intravenous Every 12 hours 04/03/19 1234 04/07/19 1520   04/02/19 0900  Ampicillin-Sulbactam (UNASYN) 3 g in sodium chloride 0.9 % 100 mL IVPB  Status:  Discontinued     3 g 200 mL/hr over 30 Minutes Intravenous Every 6 hours 04/02/19 0756 04/03/19 1234   03/30/19 1600  ertapenem (INVANZ) 1,000 mg in sodium chloride 0.9 % 100 mL IVPB     1  g 200 mL/hr over 30 Minutes Intravenous On call to O.R. 03/30/19 1558 03/30/19 1642     Subjective  The patient is resting comfortably. No new complaints.   Objective   Vitals:  Vitals:   04/14/19 0838 04/14/19 1206  BP: 132/61 (!) 127/53  Pulse: 88 82  Resp:  18  Temp:  98.4 F (36.9 C)  SpO2: 97% 96%   Exam:  Constitutional:  . The patient is awake, alert, and oriented x 3. No acute distress. Respiratory:  . No increased work of breathing. . No wheezes, rales, or rhonchi . No tactile fremitus Cardiovascular:  . Regular rate and rhythm . No murmurs, ectopy, or gallups. . No lateral PMI. No thrills. Abdomen:   . Abdomen is soft, non-tender, non-distended . No hernias, masses, or organomegaly . Normoactive bowel sounds.  . Surgical site is clean and dry. Musculoskeletal:  . No cyanosis, clubbing, or edema Skin:  . No rashes, lesions, ulcers . palpation of skin: no induration or nodules Neurologic:  . CN 2-12 intact . Sensation all 4 extremities intact Psychiatric:  . Mental status o Mood, affect appropriate o Orientation to person, place, time  . judgment and insight appear intact  I have personally reviewed the following:   Today's Data  . Vitals, I's and O's, BMP, CBC  Micro Data  . Pulmonary aspirate was positive for pseudomonas aeruginosa 04/05/2019.  Imaging  . Abdominal film 04/07/2019: Ileus . CXR 04/02/2019: Progressive bibasilar infiltrates. And fractured 9th rib.  Scheduled Meds: . aspirin EC  81 mg Oral Daily  . carvedilol  6.25 mg Oral BID WC  . Chlorhexidine Gluconate Cloth  6 each Topical Daily  . enoxaparin (LOVENOX) injection  40 mg Subcutaneous Q24H  . feeding supplement (NEPRO CARB STEADY)  237 mL Oral TID BM  . insulin aspart  0-15 Units Subcutaneous TID WC  . insulin aspart  0-5 Units Subcutaneous QHS  . mouth rinse  15 mL Mouth Rinse BID  . mometasone-formoterol  2 puff Inhalation BID  . multivitamin with minerals  1 tablet Oral Daily  . nicotine  14 mg Transdermal Daily  . pantoprazole  40 mg Oral QHS  . polyethylene glycol  17 g Oral Daily  . polyvinyl alcohol  1 drop Both Eyes BID  . tamsulosin  0.4 mg Oral Daily   Continuous Infusions: . sodium chloride Stopped (04/13/19 1020)  . dextrose 5 % and 0.45% NaCl 100 mL/hr at 04/14/19 0845    Principal Problem:   SBO (small bowel obstruction) (HCC) Active Problems:   Type 2 diabetes mellitus, uncontrolled, with renal complications (HCC)   COPD (chronic obstructive pulmonary disease) (HCC)   Gastroesophageal reflux disease without esophagitis   HTN (hypertension)   Smoker   Acute renal failure  superimposed on stage 3b chronic kidney disease (HCC)   Hyponatremia   Leucocytosis   LOS: 20 days   A & P  Sepsis suspect secondary to Pseudomonas pneumonia/aspiration pneumonia:  Leukocytosis is trending down 18.4K to 14.6 today. Procalcitonin 0.21 on 04/05/19 from  0.4 on 04/04/19 from 1.28 on 04/02/2019. Continue with cefepime 2 gm Q 12 hours. IV azithromycin stopped after 04/06/2019.  Sputum culture grew Pseudomonas, continue to follow cultures for sensitivities. Sensitivity to Cefepime. Blood cultures have had no growth to date. Continue to Monitor fever curve and WBC. Patient is afebrile. Monitor leukocytosis.  Acute hypoxic respiratory failure suspect secondary to Pseudomonas/aspiration pneumonia: Not on oxygen supplementation at baseline. He is currently saturating 95% on room  air. Will discontinuoud continuous pulse oximetry. Continue bronchodilators. Patient refuses any further suctioning per RT. Coarse airway sounds have cleared.  Suspected zoster: Left buttock and lateral left lower extremity. The patient is receiving valtrex. Wound care following. No history of zoster vaccination.  Post op s/p exploratory laparotomy and lysis of adhesions: Management per primary service. NGT out today. TPN continued. The patient is a clear liquid diet. Tolerating well. 3 BM's yesterday.  AKI: Worsened after discontinuation of TPN with increase in creatinine and potassium. Creatinine and potassium declining with IV fluids. Patient has been encouraged to take in more fluids by mouth. Monitor. The patient's baseline creatinine appears to be 1.0 with GFR greater than 60.   Hyperkalemia: Resolvign with IV fluids and decrease in creatinine.  Hypovolemic hyponatremia: NA 132 today. Monitor.  Dehydration: BUN 46, Creatinine 1.39. IV fluids.  Small bilateral pleural effusions: On antibiotic cefepime continue. Follow CXR and volume status.  Severe protein calorie malnutrition: On TPN managed by  pharmacy. Albumin of 2.1 with BMI of 21 with muscle loss. Monitor. Diet is now regular, but patient's oral intake, particularly of fluids remains poor.  Hypophosphatemia: resolved.  Right eighth rib fracture, incidentally found: Continue close monitoring and address as indicated.  DM II: Hemoglobin A1c 8.3. Glucoses are followed with FSBS and SSI. FSBS over the past 24 hours have been 156-228. Continue to hold Home meds include glipizide and Metformin.   Essential hypertension: Blood pressures are well controlled with metoprolol only now. Monitor and add back lisinopril if needed and AKI is resolved.  Hyperlipidemia: Continue statin as at home.  CAD: Continue Statin, ASA, and metoprolol.   COPD: Noted and stable. Continue bronchodilators.  GERD:  Continue IV PPI  Current daily smoker: Tobacco cessation counseling at bedside. Nicotine patch is available.  BPH: Flomax on hold due to n.p.o.  Monitor urine output.  I have seen and examined this patient myself. I have spent 30 minutes in his evaluation and care.  DVT prophylaxis:Heparin subcu 3 times daily Code Status:Code Status: Full Code Family communication: Wife at bedside.  Disposition: TBD.  Stefanos Haynesworth, DO [facility] Direct contact: see [email or web address]  7PM-7AM contact night coverage as above 04/14/2019, 4:02 PM  LOS: 14 days

## 2019-04-14 NOTE — Progress Notes (Signed)
Physical Therapy Treatment Patient Details Name: Micheal [name] MRN: [id number] DOB: 1932/12/01 Today's Date: 04/14/2019    History of Present Illness Pt is an 85 y.o. male presenting to hospital 11/4 with several days of generalized abdominal discomfort, vomiting, and nausea.  Pt admitted with SBO with NG tube placed. Pt s/p 11/9 exploratory lap and lysis of adhesions.  PMH includes h/o balance problem, COPD, DM, htn, MI, PNA, cardiac cath, ORIF L ankle fx 01/10/18, L distal radius fx 01/10/19, anterolateral R 9th and 10th rib fx's 11/27/18.    PT Comments    Pt presented with deficits in strength, transfers, mobility, gait, balance, and activity tolerance but continues to make progress towards goals.  Pt was Mod Ind with bed mobility tasks with increased speed this session and was steady with transfers with good eccentric and concentric control.  Pt ambulated 80' with slow cadence but good stability with amb distance interrupted by pt suddenly stating he needed to have a BM.  Pt was unable to make it to the commode in time with CNA contacted at the end of the session to assist with hygiene.  Pt will benefit from HHPT services upon discharge to safely address above deficits for decreased caregiver assistance and eventual return to PLOF.      Follow Up Recommendations  Home health PT;Supervision - Intermittent     Equipment Recommendations  Rolling walker with 5" wheels;3in1 (PT)    Recommendations for Other Services       Precautions / Restrictions Precautions Precautions: Fall Precaution Comments: abdominal incision Restrictions Weight Bearing Restrictions: No    Mobility  Bed Mobility Overal bed mobility: Modified Independent             General bed mobility comments: Extra time and effort during rolling and sidelying to sit but no assistance or cues for sequencing required this session  Transfers Overall transfer level: Needs assistance Equipment used: Rolling walker (2  wheeled) Transfers: Sit to/from Stand Sit to Stand: Min guard         General transfer comment: Min verbal cues for hand placement  Ambulation/Gait Ambulation/Gait assistance: Min guard Gait Distance (Feet): 80 Feet Assistive device: Rolling walker (2 wheeled) Gait Pattern/deviations: Step-through pattern;Decreased step length - right;Decreased step length - left;Trunk flexed Gait velocity: decreased   General Gait Details: Slow cadence with short B step length but steady without LOB; min verbal cues for upright posture and amb closer to the [employer]    Modified Rankin (Stroke Patients Only)       Balance Overall balance assessment: Needs assistance Sitting-balance support: Feet supported;Single extremity supported Sitting balance-Leahy Scale: Good     Standing balance support: During functional activity;Bilateral upper extremity supported Standing balance-Leahy Scale: Good Standing balance comment: Min BUE lean on RW for balance during ambulation                            Cognition Arousal/Alertness: Awake/alert Behavior During Therapy: WFL for tasks assessed/performed Overall Cognitive Status: Within Functional Limits for tasks assessed                                        Exercises Total Joint Exercises Ankle Circles/Pumps: Strengthening;Both;10 reps Quad Sets: Strengthening;Both;10 reps Gluteal Sets: Strengthening;Both;10 reps Towel Squeeze:  Strengthening;Both;10 reps Heel Slides: AROM;Both;10 reps Hip ABduction/ADduction: AROM;Both;10 reps Long Arc Quad: AROM;Both;10 reps;Strengthening Knee Flexion: AROM;Strengthening;Both;10 reps Bridges: Strengthening;Both;10 reps    General Comments        Pertinent Vitals/Pain Pain Assessment: No/denies pain    Home Living                      Prior Function            PT Goals (current goals can now be found in the care  plan section) Progress towards PT goals: Progressing toward goals    Frequency    Min 2X/week      PT Plan Current plan remains appropriate    Co-evaluation              AM-PAC PT "6 Clicks" Mobility   Outcome Measure  Help needed turning from your back to your side while in a flat bed without using bedrails?: A Little Help needed moving from lying on your back to sitting on the side of a flat bed without using bedrails?: A Little Help needed moving to and from a bed to a chair (including a wheelchair)?: A Little Help needed standing up from a chair using your arms (e.g., wheelchair or bedside chair)?: A Little Help needed to walk in hospital room?: A Little Help needed climbing 3-5 steps with a railing? : A Little 6 Click Score: 18    End of Session Equipment Utilized During Treatment: Gait belt;Other (comment)(Under arms to avoid incision) Activity Tolerance: Patient tolerated treatment well Patient left: in bed;with call bell/phone within reach Nurse Communication: Mobility status;Other (comment)(Nursing aware pt sitting at EOB in need of hygiene assistance) PT Visit Diagnosis: Difficulty in walking, not elsewhere classified (R26.2);Muscle weakness (generalized) (M62.81)     Time: [id number] PT Time Calculation (min) (ACUTE ONLY): 27 min  Charges:  $Gait Training: 8-22 mins $Therapeutic Exercise: 8-22 mins                     D. Scott Freedom Peddy PT, DPT 04/14/19, 1:08 PM

## 2019-04-15 ENCOUNTER — Inpatient Hospital Stay: Payer: Medicare Other

## 2019-04-15 DIAGNOSIS — E871 Hypo-osmolality and hyponatremia: Secondary | ICD-10-CM

## 2019-04-15 DIAGNOSIS — E1129 Type 2 diabetes mellitus with other diabetic kidney complication: Secondary | ICD-10-CM

## 2019-04-15 DIAGNOSIS — E1165 Type 2 diabetes mellitus with hyperglycemia: Secondary | ICD-10-CM

## 2019-04-15 DIAGNOSIS — J449 Chronic obstructive pulmonary disease, unspecified: Secondary | ICD-10-CM

## 2019-04-15 DIAGNOSIS — D72829 Elevated white blood cell count, unspecified: Secondary | ICD-10-CM

## 2019-04-15 DIAGNOSIS — F17200 Nicotine dependence, unspecified, uncomplicated: Secondary | ICD-10-CM

## 2019-04-15 DIAGNOSIS — I10 Essential (primary) hypertension: Secondary | ICD-10-CM

## 2019-04-15 DIAGNOSIS — N179 Acute kidney failure, unspecified: Secondary | ICD-10-CM

## 2019-04-15 DIAGNOSIS — K219 Gastro-esophageal reflux disease without esophagitis: Secondary | ICD-10-CM

## 2019-04-15 DIAGNOSIS — N1832 Chronic kidney disease, stage 3b: Secondary | ICD-10-CM

## 2019-04-15 LAB — GLUCOSE, CAPILLARY
Glucose-Capillary: 231 mg/dL — ABNORMAL HIGH (ref 70–99)
Glucose-Capillary: 249 mg/dL — ABNORMAL HIGH (ref 70–99)
Glucose-Capillary: 244 mg/dL — ABNORMAL HIGH (ref 70–99)

## 2019-04-15 LAB — CBC
HCT: 28.4 % — ABNORMAL LOW (ref 39.0–52.0)
Hemoglobin: 8.9 g/dL — ABNORMAL LOW (ref 13.0–17.0)
MCH: 29.8 pg (ref 26.0–34.0)
MCHC: 31.3 g/dL (ref 30.0–36.0)
MCV: 95 fL (ref 80.0–100.0)
Platelets: 301 10*3/uL (ref 150–400)
RBC: 2.99 MIL/uL — ABNORMAL LOW (ref 4.22–5.81)
RDW: 14.7 % (ref 11.5–15.5)
WBC: 12.7 10*3/uL — ABNORMAL HIGH (ref 4.0–10.5)
nRBC: 0 % (ref 0.0–0.2)

## 2019-04-15 LAB — BASIC METABOLIC PANEL
Anion gap: 7 (ref 5–15)
BUN: 42 mg/dL — ABNORMAL HIGH (ref 8–23)
CO2: 29 mmol/L (ref 22–32)
Calcium: 8.3 mg/dL — ABNORMAL LOW (ref 8.9–10.3)
Chloride: 98 mmol/L (ref 98–111)
Creatinine, Ser: 1.24 mg/dL (ref 0.61–1.24)
GFR calc Af Amer: >60 mL/min (ref >60)
GFR calc non Af Amer: 53 mL/min — ABNORMAL LOW (ref >60)
Glucose, Bld: 287 mg/dL — ABNORMAL HIGH (ref 70–99)
Potassium: 5.2 mmol/L — ABNORMAL HIGH (ref 3.5–5.1)
Sodium: 134 mmol/L — ABNORMAL LOW (ref 135–145)

## 2019-04-15 MED ORDER — FLUCONAZOLE 100 MG PO TABS
100.0000 mg | ORAL_TABLET | Freq: Once | ORAL | Status: AC
  Administered 2019-04-15: 100 mg via ORAL
  Filled 2019-04-15: qty 1

## 2019-04-15 MED ORDER — FLUCONAZOLE 50 MG PO TABS: 50.0000 mg | ORAL_TABLET | Freq: Every day | ORAL | Status: DC

## 2019-04-15 MED ORDER — OXYCODONE HCL 5 MG PO TABS: 5.0000 mg | ORAL_TABLET | Freq: Four times a day (QID) | ORAL | 0 refills | Status: DC | PRN

## 2019-04-15 MED ORDER — CIPROFLOXACIN HCL 500 MG PO TABS: 500.0000 mg | ORAL_TABLET | Freq: Two times a day (BID) | ORAL | 0 refills | Status: AC

## 2019-04-15 MED ORDER — IBUPROFEN 800 MG PO TABS: 800.0000 mg | ORAL_TABLET | Freq: Three times a day (TID) | ORAL | 0 refills | Status: DC | PRN

## 2019-04-15 MED ORDER — FLUCONAZOLE 50 MG PO TABS: 50.0000 mg | ORAL_TABLET | Freq: Every day | ORAL | 0 refills | Status: AC

## 2019-04-15 NOTE — Progress Notes (Signed)
PROGRESS NOTE    Micheal [name]  [id number] DOB: [birth year] DOA: 03/25/2019 PCP: Tennis Must, MD   Brief Narrative:  The patient is an 85 yr old man with a medical history significant for COPD, CAD, DM II, GERD, hypertension, hyperlipidemia, tobacco abuse. He presented to [facility] ED on 03/26/2019 from home with complaint of abdominal pain, nausea, and vomiting. It had been 6 days since his last BM. CT of the abdomen demonstrated a high grade wmall bowel obstruction with transition point in the central lower abdomen potentially due to an adhesion.   The patient was admitted to a general surgery service. The hospitalist service was consulted for comanagement of medical issues. The patient was taken to the OR on 03/30/2019 for exploratory laparotomy with adhesiolysis after conservative measures failed to resolve the problem. The patient's hospital course have been complicated by an ICU admission for worsening clinical picture and singles. In the last 24 hours the patient has developed coarse upper airway sounds. However he has had a BM and return of bowel function. NGT has been removed. He has had 3 BM's in the last 24 hours. The patient is now on a regular diet. His oral intake remains low. TPN stopped on 04/12/2019. Creatinine increased as did potassium. They have come down when IV fluids started. Oral intake of fluids has been encouraged.   Assessment & Plan:   Principal Problem:   SBO (small bowel obstruction) (HCC) Active Problems:   Type 2 diabetes mellitus, uncontrolled, with renal complications (HCC)   COPD (chronic obstructive pulmonary disease) (HCC)   Gastroesophageal reflux disease without esophagitis   HTN (hypertension)   Smoker   Acute renal failure superimposed on stage 3b chronic kidney disease (HCC)   Hyponatremia   Leucocytosis   Sepsis suspect secondary to Pseudomonas pneumonia/aspiration pneumonia:  -Leukocytosis is trending down 18.4K to 14.6 today.  -Procalcitonin  0.21 on 04/05/19 from 0.4 on 04/04/19 from 1.28 on 04/02/2019. -Completed 10 days of cefepime 11/13-11/23--confimed with parmacy, -IV azithromycin stopped after 04/06/2019.   -Sputum culture grew Pseudomonas, -Blood cultures have had no growth to date.  -Continue toMonitor fever curve and WBC.   Acute hypoxic respiratory failure suspect secondary to Pseudomonas/aspiration pneumonia:  -Not on oxygen supplementation at baseline.  -He is currently saturating 95% on room air.  -Continue bronchodilators.  -Patient refuses any further suctioning per RT. Marland Kitchen  Suspected zoster: Left buttock and lateral left lower extremity.  -The patient is receiving valtrex.  -Wound care following. No history of zoster vaccination.  Post op s/p exploratory laparotomy and lysis of adhesions:  -Management per primary service. NGT out today.  The patient is a reg diet. Tolerating well.   AKI:  -improving 1.72>1.48>1.39>1.24 -c/w mild fluids if po intake inadequate  Hyperkalemia:  -minimally elevated -monitor  Hypovolemic hyponatremia:  -resolved, Monitor.  Dehydration: BUN 42, Creatinine 1.24. IV fluids if po intake drops off  Small bilateral pleural effusions: . Follow CXR and volume status.  Severe protein calorie malnutrition:  Albumin of 2.2 with BMI of 21 with muscle loss. Monitor. Diet is now regular, but patient's oral intake, particularly of fluids remains poor.  Hypophosphatemia: resolved.  Right eighth rib fracture, incidentally found: Continue close monitoring and address as indicated.  DM II: Hemoglobin A1c 8.3. Glucoses are followed with FSBS and SSI.  -Continue to hold Home meds include glipizide and Metformin.   Essential hypertension: Blood pressures are well controlled with metoprolol only now. Monitor and add back lisinopril if needed and AKI is  resolved.  Hyperlipidemia: Continue statin as at home.  CAD: Continue Statin, ASA, and metoprolol.   COPD: Noted and  stable.Continue bronchodilators.  GERD:  Continue IV PPI  Current daily smoker: Tobacco cessation counseling at bedside. Nicotine patch is available.  BPH: Flomax on hold due to n.p.o. Monitor urine output.  DVT prophylaxis: Lovenox SQ  Code Status: Full    Code Status Orders  (From admission, onward)         Start     Ordered   03/25/19 1641  Full code  Continuous     03/25/19 1644        Code Status History    Date Active Date Inactive Code Status Order ID Comments User Context   01/09/2018 1926 01/12/2018 1627 Full Code [id number]  Dereck Leep, MD ED   08/19/2017 0322 08/21/2017 2038 Full Code [id number]  Salary, Avel Peace, MD Inpatient   Advance Care Planning Activity     Family Communication: none today  Disposition Plan:   Remain inpatient for continued treatment with antifungals and antibiotics.  Disposition per primary team Consults called: [facility] Admission status: Inpatient   Consultants:   [facility]  Procedures:  Ct Abdomen Pelvis Wo Contrast  Result Date: 03/25/2019 CLINICAL DATA:  Vomiting and constipation. EXAM: CT ABDOMEN AND PELVIS WITHOUT CONTRAST TECHNIQUE: Multidetector CT imaging of the abdomen and pelvis was performed following the standard protocol without IV contrast. COMPARISON:  Abdominal x-rays from same day. CT abdomen and pelvis dated November 27, 2018. FINDINGS: Lower chest: No acute abnormality. Hepatobiliary: No focal liver abnormality is seen. No gallstones, gallbladder wall thickening, or biliary dilatation. Pancreas: Unremarkable. No pancreatic ductal dilatation or surrounding inflammatory changes. Spleen: Normal in size without focal abnormality. Punctate calcified granuloma. Adrenals/Urinary Tract: Adrenal glands are unremarkable. Kidneys are normal, without renal calculi, focal lesion, or hydronephrosis. Bladder is unremarkable. Stomach/Bowel: Multiple dilated loops of proximal mid small bowel with transition point in the central lower abdomen  (series 2, image 50). Mild inflammatory changes surrounding the dilated small bowel loops without significant wall thickening. No pneumatosis. The ileum is decompressed. The stomach is unremarkable. Mild left-sided colonic diverticulosis. History of appendectomy. Vascular/Lymphatic: Aortic atherosclerosis. No enlarged abdominal or pelvic lymph nodes. Reproductive: Mild prostatomegaly. Other: No free fluid or pneumoperitoneum. Musculoskeletal: No acute or significant osseous findings. Subacute to chronic right eighth through tenth rib fractures. IMPRESSION: 1. High-grade small bowel obstruction with transition point in the central lower abdomen, potentially due to an adhesion. Mild small bowel inflammatory changes without wall thickening or pneumatosis. 2.  Aortic atherosclerosis (ICD10-I70.0). Electronically Signed   By: Titus Dubin M.D.   On: 03/25/2019 15:42   Dg Chest 2 View  Result Date: 04/01/2019 CLINICAL DATA:  Cough and abdominal soreness. Patient diagnosed with a right rib fracture on plain film of the chest earlier today. EXAM: CHEST - 2 VIEW COMPARISON:  Plain film of the chest earlier today. FINDINGS: NG tube has been removed. Right PICC remains in place. The patient has small bilateral pleural effusions and basilar atelectasis, greater on the right. No pneumothorax. Right eighth rib fracture again seen. IMPRESSION: Negative for pneumothorax. Small bilateral pleural effusions and basilar atelectasis, greater on the right. Right eighth rib fracture. Electronically Signed   By: Inge Rise M.D.   On: 04/01/2019 10:47   Dg Abd 1 View  Result Date: 04/06/2019 CLINICAL DATA:  Small-bowel obstruction, post lysis of adhesions on 03/30/2019. EXAM: ABDOMEN - 1 VIEW COMPARISON:  04/03/2019; CT abdomen pelvis-04/02/2019 FINDINGS: Enteric tube  tip and side port projects over the expected location of the gastric fundus. Grossly unchanged mild-to-moderate gaseous distention of multiple loops of  large and small bowel. Moderate colonic stool burden. No supine evidence of pneumoperitoneum. No pneumatosis or portal venous gas Limited visualization lower thorax demonstrates blunting the bilateral costophrenic angles. Multiple skin staples overlie the midline of the lower abdomen/pelvis. No acute osseous abnormalities. Degenerative change the lower lumbar spine and bilateral hips is suspected though incompletely evaluated. IMPRESSION: Similar findings most suggestive of ileus. Electronically Signed   By: Sandi Mariscal M.D.   On: 04/06/2019 07:26   Dg Abd 1 View  Result Date: 03/30/2019 CLINICAL DATA:  Small bowel obstruction. EXAM: ABDOMEN - 1 VIEW COMPARISON:  March 28, 2019. FINDINGS: Nasogastric tube tip seen in proximal stomach. Stable small bowel dilatation is noted concerning for distal small bowel obstruction. No colonic dilatation is noted. No abnormal calcifications are noted. IMPRESSION: Stable small bowel dilatation is noted concerning for distal small bowel obstruction. Electronically Signed   By: Marijo Conception M.D.   On: 03/30/2019 07:42   Dg Abd 1 View  Result Date: 03/28/2019 CLINICAL DATA:  NG tube in place, following SBO EXAM: ABDOMEN - 1 VIEW COMPARISON:  Abdominal radiograph 03/27/2019, 03/25/2019 FINDINGS: The nasogastric tube side port appropriately projects over the stomach. Persistent multiple dilated loops of small bowel measuring up to 4.7 cm in diameter, similar to prior. No supine evidence for free air. IMPRESSION: 1. Stable position of the nasogastric tube with side port projecting over the stomach. 2. Persistent multiple air-filled dilated loops of small bowel consistent with small bowel obstruction. Electronically Signed   By: Audie Pinto M.D.   On: 03/28/2019 10:29   Dg Abd 1 View  Result Date: 03/27/2019 CLINICAL DATA:  NG tube placement. EXAM: ABDOMEN - 1 VIEW COMPARISON:  KUB earlier today. FINDINGS: Nasogastric tube is present with tip over the stomach in  the left upper quadrant. Again noted are multiple air-filled dilated small bowel loops without significant change. No evidence of free peritoneal air. Remainder of the exam is unchanged. IMPRESSION: Nasogastric tube with tip over the stomach in the left upper quadrant. Evidence of patient's known small bowel obstruction. Electronically Signed   By: Marin Olp M.D.   On: 03/27/2019 12:37   Dg Abd 1 View  Result Date: 03/27/2019 CLINICAL DATA:  Small bowel obstruction.  NG tube in place. EXAM: ABDOMEN - 1 VIEW COMPARISON:  03/26/2019 FINDINGS: Nasogastric tube is present with tip over the stomach in the left upper quadrant. Examination demonstrates stable persistent air-filled dilated small bowel loops measuring up to 3.8 cm in diameter. Air and stool present throughout the colon. No free peritoneal air. Remainder the exam is unchanged. IMPRESSION: Persistent air-filled dilated small bowel loops without significant compatible with known small bowel obstruction. Nasogastric tube with tip over the stomach in the left upper quadrant. Electronically Signed   By: Marin Olp M.D.   On: 03/27/2019 09:48   Dg Abd 1 View  Result Date: 03/26/2019 CLINICAL DATA:  Small-bowel obstruction EXAM: ABDOMEN - 1 VIEW COMPARISON:  03/25/2019 FINDINGS: NG tube in the proximal stomach with the side hole distal esophagus. Recommend advancing NG tube into the stomach. Dilated small bowel loops with mild interval improvement. Colon nondilated. Stool in the right colon. IMPRESSION: NG side hole distal esophagus, recommend advancing into the stomach Mild improvement in small bowel dilatation. Electronically Signed   By: Franchot Gallo M.D.   On: 03/26/2019 07:36  Dg Abdomen 1 View  Result Date: 03/25/2019 CLINICAL DATA:  Encounter for NG tube placement. EXAM: ABDOMEN - 1 VIEW COMPARISON:  Abdominal radiograph 03/25/2019 at 12:57 p.m. FINDINGS: The nasogastric tube side port projects over the distal esophagus above the GE  junction. Redemonstrated multiple dilated loops of bowel consistent with small bowel obstruction. No evidence of free air. IMPRESSION: The nasogastric tube side-port projects over the distal esophagus. Recommend advancement of approximately 9 cm into the stomach. Electronically Signed   By: Audie Pinto M.D.   On: 03/25/2019 18:43   Ct Chest W Contrast  Result Date: 04/02/2019 CLINICAL DATA:  Inpatient. Small bowel obstruction status post exploratory laparotomy and lysis of adhesions 03/30/2019. Worsening leukocytosis and tachycardia. Abdominal distension. EXAM: CT CHEST, ABDOMEN, AND PELVIS WITH CONTRAST TECHNIQUE: Multidetector CT imaging of the chest, abdomen and pelvis was performed following the standard protocol during bolus administration of intravenous contrast. CONTRAST:  141mL OMNIPAQUE IOHEXOL 300 MG/ML  SOLN COMPARISON:  11/27/2018 chest CT.  03/25/2019 CT abdomen/pelvis. FINDINGS: CT CHEST FINDINGS Cardiovascular: Normal heart size. No significant pericardial effusion/thickening. Three-vessel coronary atherosclerosis. Right PICC terminates in the lower third of the SVC. Atherosclerotic nonaneurysmal thoracic aorta. Normal caliber pulmonary arteries. No central pulmonary emboli. Mediastinum/Nodes: Peripherally calcified centrally low-attenuation 1.8 cm structure along the posterior right thyroid, unchanged, potentially an exophytic right thyroid nodule. Enteric tube terminates in the proximal stomach. Normal esophagus. No pathologically enlarged axillary, mediastinal or hilar lymph nodes. Lungs/Pleura: No pneumothorax. Small dependent bilateral pleural effusions, right greater than left. Moderate centrilobular emphysema with diffuse bronchial wall thickening. Mild passive atelectasis in the dependent lower lobes, right greater than left. There is patchy peribronchovascular consolidation in posterior lower lobes, left greater than right. No lung masses or significant pulmonary nodules. Layering  debris in trachea and central bronchi. Musculoskeletal: No aggressive appearing focal osseous lesions. Healing subacute lateral right eighth, ninth and tenth rib fractures. Mild thoracic spondylosis. CT ABDOMEN PELVIS FINDINGS Hepatobiliary: Normal liver size. No discrete liver masses. Normal gallbladder with no radiopaque cholelithiasis. New minimal central intrahepatic biliary ductal dilatation. Dilated common bile duct (10 mm diameter), new since 03/24/2009 CT. No radiopaque choledocholithiasis. Pancreas: Normal, with no mass or duct dilation. Spleen: Normal size. No mass. Adrenals/Urinary Tract: Normal adrenals. No hydronephrosis. Scattered subcentimeter hypodense renal cortical lesions in the right kidney are too small to characterize and require no follow-up. No left renal lesions. Markedly distended urinary bladder. Gas in the nondependent bladder. No definite bladder wall thickening. Stomach/Bowel: Enteric tube terminates in the proximal stomach. Stomach is nondistended and appears normal. Mild diffuse small bowel dilatation up to 3.8 cm diameter, improved compared to the preoperative 03/25/2019 CT. No discrete small bowel caliber transition. No significant small bowel wall thickening. Appendectomy. Chronic postsurgical changes from partial distal colectomy with intact appearing distal colonic anastomosis. Mild diffuse gaseous distention of remnant large-bowel. No large bowel wall thickening. Mild distal colonic diverticulosis. Vascular/Lymphatic: Atherosclerotic nonaneurysmal abdominal aorta. Patent portal, splenic, hepatic and renal veins. No pathologically enlarged lymph nodes in the abdomen or pelvis. Reproductive: Mildly enlarged prostate status post TURP. Other: Scattered small volume pneumoperitoneum throughout the anterior peritoneal cavity. Small volume ascites, predominantly perihepatic. No focal fluid collection. Mild presacral space edema. Status post midline vertical laparotomy with associated  skin staples. Anasarca. Musculoskeletal: No aggressive appearing focal osseous lesions. Mild lumbar spondylosis. IMPRESSION: 1. Patchy consolidation in posterior lower lobes, left greater than right. Layering debris in the trachea and central bronchi. Findings are most compatible with multilobar aspiration pneumonia. 2. Moderate  centrilobular emphysema with diffuse bronchial wall thickening, suggesting COPD. 3. Evidence of third spacing of fluid with small dependent bilateral pleural effusions right greater than left, anasarca, presacral space edema and small volume ascites. 4. Well positioned right PICC and enteric tube. 5. Biliary ductal dilatation with CBD diameter 10 mm, new since 03/25/2019 CT, potentially representing ampullary contraction if the patient is on opioid analgesia. No radiopaque choledocholithiasis. Recommend correlation with serum bilirubin levels. 6. Mild diffuse dilatation of the small and large bowel without focal bowel caliber transition, compatible with postoperative adynamic ileus. Scattered pneumoperitoneum is compatible with recent laparotomy. 7. Marked distention of the urinary bladder. No hydronephrosis. Please correlate clinically to exclude bladder outlet obstruction. Gas in the nondependent bladder, presumably due to recent instrumentation. 8. Healing subacute lateral right eighth through tenth rib fractures. 9. Aortic Atherosclerosis (ICD10-I70.0) and Emphysema (ICD10-J43.9). Electronically Signed   By: Ilona Sorrel M.D.   On: 04/02/2019 11:06   Ct Abdomen Pelvis W Contrast  Result Date: 04/02/2019 CLINICAL DATA:  Inpatient. Small bowel obstruction status post exploratory laparotomy and lysis of adhesions 03/30/2019. Worsening leukocytosis and tachycardia. Abdominal distension. EXAM: CT CHEST, ABDOMEN, AND PELVIS WITH CONTRAST TECHNIQUE: Multidetector CT imaging of the chest, abdomen and pelvis was performed following the standard protocol during bolus administration of  intravenous contrast. CONTRAST:  14mL OMNIPAQUE IOHEXOL 300 MG/ML  SOLN COMPARISON:  11/27/2018 chest CT.  03/25/2019 CT abdomen/pelvis. FINDINGS: CT CHEST FINDINGS Cardiovascular: Normal heart size. No significant pericardial effusion/thickening. Three-vessel coronary atherosclerosis. Right PICC terminates in the lower third of the SVC. Atherosclerotic nonaneurysmal thoracic aorta. Normal caliber pulmonary arteries. No central pulmonary emboli. Mediastinum/Nodes: Peripherally calcified centrally low-attenuation 1.8 cm structure along the posterior right thyroid, unchanged, potentially an exophytic right thyroid nodule. Enteric tube terminates in the proximal stomach. Normal esophagus. No pathologically enlarged axillary, mediastinal or hilar lymph nodes. Lungs/Pleura: No pneumothorax. Small dependent bilateral pleural effusions, right greater than left. Moderate centrilobular emphysema with diffuse bronchial wall thickening. Mild passive atelectasis in the dependent lower lobes, right greater than left. There is patchy peribronchovascular consolidation in posterior lower lobes, left greater than right. No lung masses or significant pulmonary nodules. Layering debris in trachea and central bronchi. Musculoskeletal: No aggressive appearing focal osseous lesions. Healing subacute lateral right eighth, ninth and tenth rib fractures. Mild thoracic spondylosis. CT ABDOMEN PELVIS FINDINGS Hepatobiliary: Normal liver size. No discrete liver masses. Normal gallbladder with no radiopaque cholelithiasis. New minimal central intrahepatic biliary ductal dilatation. Dilated common bile duct (10 mm diameter), new since 03/24/2009 CT. No radiopaque choledocholithiasis. Pancreas: Normal, with no mass or duct dilation. Spleen: Normal size. No mass. Adrenals/Urinary Tract: Normal adrenals. No hydronephrosis. Scattered subcentimeter hypodense renal cortical lesions in the right kidney are too small to characterize and require no  follow-up. No left renal lesions. Markedly distended urinary bladder. Gas in the nondependent bladder. No definite bladder wall thickening. Stomach/Bowel: Enteric tube terminates in the proximal stomach. Stomach is nondistended and appears normal. Mild diffuse small bowel dilatation up to 3.8 cm diameter, improved compared to the preoperative 03/25/2019 CT. No discrete small bowel caliber transition. No significant small bowel wall thickening. Appendectomy. Chronic postsurgical changes from partial distal colectomy with intact appearing distal colonic anastomosis. Mild diffuse gaseous distention of remnant large-bowel. No large bowel wall thickening. Mild distal colonic diverticulosis. Vascular/Lymphatic: Atherosclerotic nonaneurysmal abdominal aorta. Patent portal, splenic, hepatic and renal veins. No pathologically enlarged lymph nodes in the abdomen or pelvis. Reproductive: Mildly enlarged prostate status post TURP. Other: Scattered small volume pneumoperitoneum throughout  the anterior peritoneal cavity. Small volume ascites, predominantly perihepatic. No focal fluid collection. Mild presacral space edema. Status post midline vertical laparotomy with associated skin staples. Anasarca. Musculoskeletal: No aggressive appearing focal osseous lesions. Mild lumbar spondylosis. IMPRESSION: 1. Patchy consolidation in posterior lower lobes, left greater than right. Layering debris in the trachea and central bronchi. Findings are most compatible with multilobar aspiration pneumonia. 2. Moderate centrilobular emphysema with diffuse bronchial wall thickening, suggesting COPD. 3. Evidence of third spacing of fluid with small dependent bilateral pleural effusions right greater than left, anasarca, presacral space edema and small volume ascites. 4. Well positioned right PICC and enteric tube. 5. Biliary ductal dilatation with CBD diameter 10 mm, new since 03/25/2019 CT, potentially representing ampullary contraction if the  patient is on opioid analgesia. No radiopaque choledocholithiasis. Recommend correlation with serum bilirubin levels. 6. Mild diffuse dilatation of the small and large bowel without focal bowel caliber transition, compatible with postoperative adynamic ileus. Scattered pneumoperitoneum is compatible with recent laparotomy. 7. Marked distention of the urinary bladder. No hydronephrosis. Please correlate clinically to exclude bladder outlet obstruction. Gas in the nondependent bladder, presumably due to recent instrumentation. 8. Healing subacute lateral right eighth through tenth rib fractures. 9. Aortic Atherosclerosis (ICD10-I70.0) and Emphysema (ICD10-J43.9). Electronically Signed   By: Ilona Sorrel M.D.   On: 04/02/2019 11:06   Dg Chest Port 1 View  Result Date: 04/15/2019 CLINICAL DATA:  Aspiration pneumonia EXAM: PORTABLE CHEST 1 VIEW COMPARISON:  Portable exam 0901 hours compared old 04/02/2019 FINDINGS: RIGHT arm PICC line with tip projecting over SVC. Normal heart size, mediastinal contours, and pulmonary vascularity. Survey aorta RIGHT basilar infiltrate either representing pneumonia or aspiration. Associated small pleural effusion. BILATERAL nipple shadows, not seen on prior exam. Healing lower lateral RIGHT rib fractures. No pneumothorax. IMPRESSION: Persistent infiltrate at RIGHT base question pneumonia versus aspiration. Small associated RIGHT pleural effusion. Electronically Signed   By: Lavonia Dana M.D.   On: 04/15/2019 09:13   Dg Chest Port 1 View  Result Date: 04/02/2019 CLINICAL DATA:  Hypoxia. EXAM: PORTABLE CHEST 1 VIEW COMPARISON:  04/01/2019.  01/09/2018. FINDINGS: NG tube noted on today's exam. Tip projected over the upper stomach. Right PICC line stable position. Heart size normal. Progressive bibasilar pulmonary infiltrates/edema. Stable nodular opacity over the right base, most likely nipple shadow. Small pleural effusions unchanged. No pneumothorax. Right eighth rib fracture  again noted. IMPRESSION: 1. Interim placement of NG tube, its tip is projected over the upper stomach. 2. Progressive bibasilar pulmonary infiltrates/edema. Tiny bilateral pleural effusions again noted. 3.  Right eighth rib fracture again noted.  No pneumothorax. Electronically Signed   By: Marcello Moores  Register   On: 04/02/2019 06:54   Dg Chest Port 1 View  Addendum Date: 04/01/2019   ADDENDUM REPORT: 04/01/2019 09:24 ADDENDUM: These results were called by telephone at the time of interpretation on 04/01/2019 at 9:24 am to provider [facility] , who verbally acknowledged these results. Electronically Signed   By: Zetta Bills M.D.   On: 04/01/2019 09:24   Result Date: 04/01/2019 CLINICAL DATA:  Concern for pneumonia. EXAM: PORTABLE CHEST 1 VIEW COMPARISON:  It has FINDINGS: Mild to moderately displaced rib fracture on the right involving the right eighth rib laterally. Right-sided PICC line in situ terminates at the caval to atrial junction. Enteric tube with side port just below GE junction projecting off the field of the radiograph. Cardiomediastinal contours are normal. There is graded opacity at the right lung base with some lucency at the right  cardiophrenic angle. No apical pneumothorax on this semi-erect view. IMPRESSION: 1. Mild to moderately displaced rib fracture on the right involving the right eighth rib laterally. 2. There is graded opacity at the right lung base medially. Question lower lobe airspace disease and effusion. 3. Rib fracture with some subtle increased lucency at the right lung base. Raising the question of subtle sign of pneumothorax. Consider expiratory upright views of the chest for further assessment to exclude small pneumothorax. 4. A call is out to the referring provider to further discuss above findings. Electronically Signed: By: Zetta Bills M.D. On: 04/01/2019 09:07   Dg Abd Acute 2+v W 1v Chest  Result Date: 04/13/2019 CLINICAL DATA:  Ileus EXAM: DG ABDOMEN ACUTE  W/ 1V CHEST COMPARISON:  April 07, 2019 FINDINGS: Right PICC line overlies the SVC. Enteric tube is no longer present. Persistent trace bilateral pleural effusions. No new consolidation or edema. Decreased gaseous distention of bowel. Moderate stool burden throughout the colon. IMPRESSION: Improved gaseous distention of bowel. Electronically Signed   By: Macy Mis M.D.   On: 04/13/2019 08:15   Dg Abd Acute 2+v W 1v Chest  Result Date: 04/07/2019 CLINICAL DATA:  Ileus and pneumonia EXAM: DG ABDOMEN ACUTE W/ 1V CHEST COMPARISON:  04/06/19 20; 04/03/2019; 04/02/2019; chest CT-04/02/2019 FINDINGS: Grossly unchanged cardiac silhouette and mediastinal contours with atherosclerotic plaque within the thoracic aorta. The lungs remain hyperexpanded with unchanged trace bilateral pleural effusions. No discrete focal airspace opacities. No pneumothorax. Stable position of support apparatus. Redemonstrated diffuse gaseous distention of the large and small bowel, unchanged. No pneumoperitoneum, pneumatosis or portal venous gas Enteric suture line overlies the midline of the lower pelvis. Midline surgical staples. Degenerative change of the lumbar spine is suspected though incompletely evaluated. IMPRESSION: 1. Similar findings of lung hyperexpansion and trace bilateral pleural effusions. 2. Similar findings most suggestive of ileus, unchanged. Electronically Signed   By: Sandi Mariscal M.D.   On: 04/07/2019 08:24   Dg Abd Portable 1v  Result Date: 04/03/2019 CLINICAL DATA:  Check gastric catheter placement EXAM: PORTABLE ABDOMEN - 1 VIEW COMPARISON:  04/02/2019 FINDINGS: Gastric catheter has been advanced further into the stomach. Scattered large and small bowel gas is noted. IMPRESSION: Gastric catheter is noted within the stomach Electronically Signed   By: Inez Catalina M.D.   On: 04/03/2019 02:06   Dg Abd Portable 1v  Result Date: 04/02/2019 CLINICAL DATA:  Small-bowel obstruction. EXAM: PORTABLE ABDOMEN -  1 VIEW COMPARISON:  04/01/2019.  CT 03/25/2019. FINDINGS: NG tube noted with tip in the upper most portion stomach. Side hole at the gastroesophageal junction. NG tube advancement of approximately 10 cm should be considered. Surgical staples are noted over the abdomen. Distended loops of small and large bowel are again noted suggesting adynamic ileus. Continued follow-up exam suggested to demonstrate resolution. No free intraperitoneal air. Aortoiliac vascular calcifications noted. Degenerative change lumbar spine and both hips. IMPRESSION: 1. NG tube noted with tip in the upper most portion of the stomach. Side hole is at the gastroesophageal junction. NG tube advancement of approximately 10 cm should be considered. 2. Surgical staples noted over the abdomen. Distended loops of small large bowel are again noted suggesting adynamic ileus. Continued follow-up exam suggested to demonstrate resolution. Electronically Signed   By: Marcello Moores  Register   On: 04/02/2019 07:05   Dg Abd Portable 1v  Result Date: 04/01/2019 CLINICAL DATA:  NG tube placement EXAM: PORTABLE ABDOMEN - 1 VIEW COMPARISON:  April 01, 2019 FINDINGS: The enteric tube  tip now projects over the gastric body. There is gaseous distention of loops of small bowel and colon. There is persistent blunting of the right costophrenic angle. There is an acute to subacute fracture involving the ninth rib anterior laterally on the right. IMPRESSION: NG tube projects over the stomach. Electronically Signed   By: Constance Holster M.D.   On: 04/01/2019 15:17   Dg Abd Portable 1v  Result Date: 04/01/2019 CLINICAL DATA:  Small bowel obstruction. EXAM: PORTABLE ABDOMEN - 1 VIEW COMPARISON:  03/30/2019 and CT abdomen pelvis 03/25/2019. FINDINGS: Diffuse gaseous distention of small bowel and colon with gas and stool in the rectum. Nasogastric tube terminates in the stomach with the side port at or just above the gastroesophageal junction. Skin staples are seen  in the midline of the lower abdomen and pelvis. Visualized lung bases are grossly clear. IMPRESSION: 1. Bowel gas pattern is indicative of an ileus. 2. Nasogastric tube could be advanced 8-10 cm for better positioning within the stomach, as clinically indicated. Electronically Signed   By: Lorin Picket M.D.   On: 04/01/2019 08:41   Dg Abd Portable 2 Views  Result Date: 03/25/2019 CLINICAL DATA:  Constipation few days with vomiting 2 days. EXAM: PORTABLE ABDOMEN - 2 VIEW COMPARISON:  None. FINDINGS: Examination demonstrates several air-filled dilated small bowel loops over the left central to mid abdomen measuring up to 5.3 cm in diameter. There are a few scattered air-fluid levels present. No evidence of free peritoneal air. Air is present throughout the colon. Degenerative change of the spine and hips. IMPRESSION: Several air-filled dilated small bowel loops measuring up to 5.3 cm in diameter with a few scattered air-fluid levels. Findings likely due to early/partial small bowel obstruction and less likely ileus. Electronically Signed   By: Marin Olp M.D.   On: 03/25/2019 13:19   [place] Ekg Site Rite  Result Date: 03/30/2019 If Site Rite image not attached, placement could not be confirmed due to current cardiac rhythm.    Antimicrobials:   Completed 10 days of cefepime November 13 till November 23  Start of fluconazole today 11/25 for 4 days   Subjective: No acute events  Objective: Vitals:   04/14/19 2046 04/15/19 0518 04/15/19 0841 04/15/19 1201  BP: 137/64 130/69 (!) 146/72 (!) 111/49  Pulse: 89 77 88 94  Resp: 18   18  Temp: 98.5 F (36.9 C) (!) 97.4 F (36.3 C) 98 F (36.7 C) 98 F (36.7 C)  TempSrc: Oral Oral Oral Oral  SpO2: 95% 95% 94% 96%  Weight:      Height:        Intake/Output Summary (Last 24 hours) at 04/15/2019 1407 Last data filed at 04/15/2019 1009 Gross per 24 hour  Intake 1039.27 ml  Output 1800 ml  Net -760.73 ml   Filed Weights   04/12/19 0436  04/13/19 0448 04/14/19 0500  Weight: 60 kg 60 kg 63.5 kg    Examination:  General exam: Appears calm and comfortable  Respiratory system: Clear to auscultation. Respiratory effort normal. Cardiovascular system: S1 & S2 heard, RRR. No JVD, murmurs, rubs, gallops or clicks. No pedal edema. Gastrointestinal system: Abdomen is nondistended, soft and nontender. No organomegaly or masses felt. Normal bowel sounds heard. Central nervous system: Alert and oriented. Extremities: wwp, nv intact. Skin: No rashes, lesions or ulcers Psychiatry: Judgement and insight appear normal. Mood & affect flat     Data Reviewed: I have personally reviewed following labs and imaging studies  CBC: Recent Labs  Lab 04/11/19 0700 04/12/19 0636 04/13/19 0432 04/14/19 0539 04/15/19 0433  WBC 14.6* 15.1* 16.0* 13.2* 12.7*  NEUTROABS  --   --   --  9.8*  --   HGB 8.6* 8.6* 8.3* 8.4* 8.9*  HCT 25.9* 26.5* 26.7* 25.7* 28.4*  MCV 89.9 94.0 94.0 91.1 95.0  PLT 246 263 268 262 [id number]   Basic Metabolic Panel: Recent Labs  Lab 04/09/19 0524 04/10/19 0516  04/13/19 0432 04/13/19 0941 04/13/19 2113 04/14/19 0539 04/15/19 0433  NA 135 134*   < > 134* 132* 134* 132* 134*  K 4.5 4.7   < > 5.8* 5.7* 5.3* 5.1 5.2*  CL 97* 98   < > 101 98 99 97* 98  CO2 29 29   < > 26 26 28 29 29   GLUCOSE 195* 142*   < > 189* 291* 178* 274* 287*  BUN 37* 38*   < > 49* 49* 47* 46* 42*  CREATININE 1.37* 1.32*   < > 1.61* 1.72* 1.48* 1.39* 1.24  CALCIUM 8.4* 8.3*   < > 8.5* 8.7* 8.5* 8.4* 8.3*  MG 1.9 2.1  --  1.9  --   --   --   --   PHOS 3.1 3.5  --  3.2  --   --   --   --    < > = values in this interval not displayed.   GFR: Estimated Creatinine Clearance: 35.6 mL/min (by C-G formula based on SCr of 1.24 mg/dL). Liver Function Tests: Recent Labs  Lab 04/09/19 0524 04/13/19 0432  AST  --  25  ALT  --  25  ALKPHOS  --  72  BILITOT  --  0.4  PROT  --  5.9*  ALBUMIN 2.1* 2.2*   No results for input(s): LIPASE,  AMYLASE in the last 168 hours. No results for input(s): AMMONIA in the last 168 hours. Coagulation Profile: No results for input(s): INR, PROTIME in the last 168 hours. Cardiac Enzymes: No results for input(s): CKTOTAL, CKMB, CKMBINDEX, TROPONINI in the last 168 hours. BNP (last 3 results) No results for input(s): PROBNP in the last 8760 hours. HbA1C: No results for input(s): HGBA1C in the last 72 hours. CBG: Recent Labs  Lab 04/14/19 1135 04/14/19 1620 04/14/19 2110 04/15/19 0747 04/15/19 1201  GLUCAP 199* 217* 254* 231* 249*   Lipid Profile: No results for input(s): CHOL, HDL, LDLCALC, TRIG, CHOLHDL, LDLDIRECT in the last 72 hours. Thyroid Function Tests: No results for input(s): TSH, T4TOTAL, FREET4, T3FREE, THYROIDAB in the last 72 hours. Anemia Panel: No results for input(s): VITAMINB12, FOLATE, FERRITIN, TIBC, IRON, RETICCTPCT in the last 72 hours. Sepsis Labs: No results for input(s): PROCALCITON, LATICACIDVEN in the last 168 hours.  Recent Results (from the past 240 hour(s))  Urine Culture     Status: None   Collection Time: 04/11/19  3:10 PM   Specimen: Urine, Random  Result Value Ref Range Status   Specimen Description   Final    URINE, RANDOM Performed at [facility], [address], [place], [place] [address]    Special Requests   Final    NONE Performed at [facility], [address], [place], [place] [address]    Culture   Final    NO GROWTH Performed at [facility], [place] [address] [place], [place] [address]    Report Status 04/13/2019 FINAL  Final         Radiology Studies: Dg Chest [facility] 1 [address]  Result Date: 04/15/2019 CLINICAL DATA:  Aspiration pneumonia EXAM: PORTABLE CHEST 1 VIEW COMPARISON:  Portable exam 0901 hours compared old 04/02/2019 FINDINGS: RIGHT arm PICC line with tip projecting over SVC. Normal heart size, mediastinal contours, and pulmonary vascularity. Survey aorta RIGHT basilar infiltrate either  representing pneumonia or aspiration. Associated small pleural effusion. BILATERAL nipple shadows, not seen on prior exam. Healing lower lateral RIGHT rib fractures. No pneumothorax. IMPRESSION: Persistent infiltrate at RIGHT base question pneumonia versus aspiration. Small associated RIGHT pleural effusion. Electronically Signed   By: Lavonia Dana M.D.   On: 04/15/2019 09:13        Scheduled Meds:  aspirin EC  81 mg Oral Daily   carvedilol  6.25 mg Oral BID WC   Chlorhexidine Gluconate Cloth  6 each Topical Daily   enoxaparin (LOVENOX) injection  40 mg Subcutaneous Q24H   feeding supplement (NEPRO CARB STEADY)  237 mL Oral TID BM   [START ON 04/16/2019] fluconazole  50 mg Oral Daily   insulin aspart  0-15 Units Subcutaneous TID WC   insulin aspart  0-5 Units Subcutaneous QHS   mouth rinse  15 mL Mouth Rinse BID   mometasone-formoterol  2 puff Inhalation BID   multivitamin with minerals  1 tablet Oral Daily   nicotine  14 mg Transdermal Daily   pantoprazole  40 mg Oral QHS   polyethylene glycol  17 g Oral Daily   polyvinyl alcohol  1 drop Both Eyes BID   tamsulosin  0.4 mg Oral Daily   Continuous Infusions:  sodium chloride Stopped (04/13/19 1020)   dextrose 5 % and 0.45% NaCl 100 mL/hr at 04/15/19 0859     LOS: 21 days    Time spent: 35 min    Nicolette Bang, MD [facility]  If 7PM-7AM, please contact night-coverage  04/15/2019, 2:07 PM

## 2019-04-15 NOTE — Progress Notes (Signed)
Inpatient Diabetes Program Recommendations  AACE/ADA: New Consensus Statement on Inpatient Glycemic Control (2015)  Target Ranges:  Prepandial:   less than 140 mg/dL      Peak postprandial:   less than 180 mg/dL (1-2 hours)      Critically ill patients:  140 - 180 mg/dL   Lab Results  Component Value Date   GLUCAP 231 (H) 04/15/2019   HGBA1C 6.8 (H) 01/09/2018    Review of Glycemic Control Results for Micheal [name], Micheal [name] (MRN [id number]) as of 04/15/2019 10:45  Ref. Range 04/14/2019 07:40 04/14/2019 11:35 04/14/2019 16:20 04/14/2019 21:10 04/15/2019 07:47  Glucose-Capillary Latest Ref Range: 70 - 99 mg/dL 228 (H) 199 (H) 217 (H) 254 (H) 231 (H)    Inpatient Diabetes Program Recommendations:   While in the hospital: -Levemir 10 units daily (0.15 x 63 kg)  Thank you, Bethena Roys E. Kierre Hintz, RN, MSN, CDE  Diabetes Coordinator Inpatient Glycemic Control Team Team Pager [phone] (8am-5pm) 04/15/2019 10:46 AM

## 2019-04-15 NOTE — Discharge Summary (Signed)
[facility] SURGICAL DISCHARGE SUMMARY  Patient ID: COLLINS VANDENBOS MRN: [id number] DOB/AGE: 1932-11-03 85 y.o.  Admit date: 03/25/2019 Discharge date: 04/15/2019  Discharge Diagnoses Patient Active Problem List   Diagnosis Date Noted  . Smoker 03/26/2019  . Acute renal failure superimposed on stage 3b chronic kidney disease ([place]) 03/26/2019  . Hyponatremia 03/26/2019  . Leucocytosis 03/26/2019  . SBO (small bowel obstruction) ([place]) 03/25/2019  . Bimalleolar ankle fracture, left, closed, initial encounter 01/09/2018  . Respiratory failure with hypoxia and hypercapnia ([place]) 08/19/2017  . Community acquired pneumonia of left lower lobe of lung   . Type 2 diabetes mellitus, uncontrolled, with renal complications ([place]) [id number]  . Gastroesophageal reflux disease without esophagitis 05/26/2015  . Alcohol abuse 01/01/2013  . COPD (chronic obstructive pulmonary disease) ([place]) 10/22/2012  . HTN (hypertension) 10/22/2012    Consultants Medicine PCCM  Procedures 03/30/2019:    HPI: 85 y.o. male presented to [facility] ED today for abdominal pain. Patient reports the onset of centralized abdominal pain on Sunday. The pain has been intermittent since that time but recently became more constant. He described this as a sharp cramping pain. Nothing makes the pain better or worse. He endorses associated nausea and emesis with the pain over the last 2 days. No fever, chills, cough, congestion, CP, or SOB. Last BM was Saturday. Not passing gas. He does report a prior abdominal surgery in which "they removed part of my intestines" but he is uncertain as to when. PO intake in the last 4 days has been decreased. Work up in the ED was concerning for leukocytosis, AKI, and small bowel obstruction on CT.   Hospital Course: Patient was admitted to general surgery and attempted conservative management. However he did not progress as expected and had no improvement despite 5 days of NGT  decompression. On 11/09 he underwent exploratory laparotomy with Dr Hampton Abbot which revealed adhesions. Post-operatively he developed prolonged postsurgical ileus and required ICU admission for a brief period. He subsequently developed aspiration PNA and completed course of IV ABx here. He did require TPN nutrition for a prolonged time as well. By the day of discharge he was ambulating, pain was controlled, having bowel function, and tolerating a diet. He did develop a UTI and will be sent home with ABx as well as pain medication. He was offered home health PT but declined. He will be given DME rolling walker. He will follow up with surgery in 2 weeks.   Discharge Condition: Good     Allergies as of 04/15/2019   No Known Allergies     Medication List    TAKE these medications   albuterol 108 (90 Base) MCG/ACT inhaler Commonly known as: VENTOLIN HFA Inhale 2 puffs into the lungs every 6 (six) hours as needed for wheezing or shortness of breath.   albuterol (2.5 MG/3ML) 0.083% nebulizer solution Commonly known as: PROVENTIL Inhale 3 mLs into the lungs every 4 (four) hours as needed for wheezing or shortness of breath.   aspirin EC 81 MG tablet Take 81 mg by mouth daily.   atorvastatin 40 MG tablet Commonly known as: LIPITOR Take 40 mg by mouth daily.   carvedilol 6.25 MG tablet Commonly known as: COREG Take 6.25 mg by mouth 2 (two) times daily with a meal.   ciprofloxacin 500 MG tablet Commonly known as: Cipro Take 1 tablet (500 mg total) by mouth 2 (two) times daily for 7 days.   fluconazole 50 MG tablet Commonly known as: DIFLUCAN Take 1  tablet (50 mg total) by mouth daily for 5 days. Start taking on: April 16, 2019   Fluticasone-Salmeterol 100-50 MCG/DOSE Aepb Commonly known as: Advair Diskus Inhale 1 puff into the lungs 2 (two) times daily.   glipiZIDE 2.5 MG 24 hr tablet Commonly known as: GLUCOTROL XL Take 2.5 mg by mouth daily with breakfast.   ibuprofen 800 MG  tablet Commonly known as: ADVIL Take 1 tablet (800 mg total) by mouth every 8 (eight) hours as needed.   ipratropium-albuterol 0.5-2.5 (3) MG/3ML Soln Commonly known as: DUONEB Inhale 3 mLs into the lungs every 6 (six) hours as needed for wheezing.   lisinopril 10 MG tablet Commonly known as: ZESTRIL Take 10 mg by mouth daily.   metFORMIN 500 MG 24 hr tablet Commonly known as: GLUCOPHAGE-XR Take 1,000 mg by mouth every evening.   Multi-Vitamin tablet Take 1 tablet by mouth daily.   oxyCODONE 5 MG immediate release tablet Commonly known as: Oxy IR/ROXICODONE Take 1 tablet (5 mg total) by mouth every 6 (six) hours as needed for severe pain.   tamsulosin 0.4 MG Caps capsule Commonly known as: FLOMAX Take 0.4 mg by mouth daily.   tiotropium 18 MCG inhalation capsule Commonly known as: Spiriva HandiHaler Place 1 capsule (18 mcg total) into inhaler and inhale daily.   traMADol 50 MG tablet Commonly known as: Ultram Take 1 tablet (50 mg total) by mouth every 12 (twelve) hours as needed.            Durable Medical Equipment  (From admission, onward)         Start     Ordered   04/10/19 1432  For home use only DME Bedside commode  Once    Question:  Patient needs a bedside commode to treat with the following condition  Answer:  Weakness   04/10/19 1431           Follow-up Information    Piscoya, Jacqulyn Bath, MD. Schedule an appointment as soon as possible for a visit in 2 week(s).   Specialty: General Surgery Why: s/p exploratory laparotomy with Piscoya Contact information: [address] [place] [address] [phone]            Time spent on discharge management including discussion of hospital course, clinical condition, outpatient instructions, prescriptions, and follow up with the patient and members of the medical team: >30 minutes  -- Edison Simon , PA-C [facility]  04/15/2019, 3:55 PM [phone] M-F: 7am -  4pm

## 2019-04-15 NOTE — TOC Transition Note (Signed)
Transition of Care [facility]) - CM/SW Discharge Note   Patient Details  Name: Micheal [name] MRN: [id number] Date of Birth: April 09, 1933  Transition of Care [facility]) CM/SW Contact:  Beverly Sessions, RN Phone Number: 04/15/2019, 5:43 PM   Clinical Narrative:    Patient to discharge home today.  Again Declines home health services.  MD notified.  Wife to transport at discharge.  RW delivered to room         Patient Goals and CMS Choice        Discharge Placement                       Discharge Plan and Services   Discharge Planning Services: CM Consult            DME Arranged: Bedside commode DME Agency: [employer] Date DME Agency Contacted: 04/10/19   Representative spoke with at DME Agency: [facility] HH Arranged: Refused SNF, Patient Refused [facility]          Social Determinants of Health ([place]) Interventions     Readmission Risk Interventions Readmission Risk Prevention Plan 04/10/2019 01/12/2018  Transportation Screening Complete Complete  PCP or Specialist Appt within 5-7 Days - Complete  Home Care Screening - Complete  Medication Review (RN CM) - Complete  Medication Review (RN Care Manager) Complete -  [place] or [place] Patient refused -  Palliative Care Screening Not Applicable -  [place] Patient Refused -  Some recent data might be hidden

## 2019-04-15 NOTE — Progress Notes (Signed)
Physical Therapy Treatment Patient Details Name: Micheal [name] MRN: [id number] DOB: 11-Oct-1932 Today's Date: 04/15/2019    History of Present Illness Pt is an 85 y.o. male presenting to hospital 11/4 with several days of generalized abdominal discomfort, vomiting, and nausea.  Pt admitted with SBO with NG tube placed. Pt s/p 11/9 exploratory lap and lysis of adhesions.  PMH includes h/o balance problem, COPD, DM, htn, MI, PNA, cardiac cath, ORIF L ankle fx 01/10/18, L distal radius fx 01/10/19, anterolateral R 9th and 10th rib fx's 11/27/18.    PT Comments    Of note, pt's Ka was 5.2 with Dr. Hampton Abbot contacted and gave verbal order for patient ok to work with PT.  Pt presented with deficits in strength, transfers, mobility, gait, balance, and activity tolerance but continues to make progress towards goals.  Pt was steady with sit to/from stand transfers from various height surfaces with good eccentric and concentric control.  Pt continued to amb with a very slow cadence and short B step length but with no noted instability.  No adverse symptoms noted during the session with max measured HR 105 bpm after amb.  Pt will benefit from HHPT services upon discharge to safely address above deficits for decreased caregiver assistance and eventual return to PLOF.       Follow Up Recommendations  Home health PT;Supervision - Intermittent     Equipment Recommendations  Rolling walker with 5" wheels    Recommendations for Other Services       Precautions / Restrictions Precautions Precautions: Fall Precaution Comments: abdominal incision Restrictions Weight Bearing Restrictions: No    Mobility  Bed Mobility Overal bed mobility: Modified Independent             General bed mobility comments: Extra time and effort during rolling and sidelying to sit but no assistance or cues for sequencing required this session  Transfers Overall transfer level: Needs assistance Equipment used: Rolling  walker (2 wheeled) Transfers: Sit to/from Stand Sit to Stand: Supervision         General transfer comment: Min verbal cues for hand placement  Ambulation/Gait Ambulation/Gait assistance: Min guard Gait Distance (Feet): 80 Feet Assistive device: Rolling walker (2 wheeled) Gait Pattern/deviations: Step-through pattern;Decreased step length - right;Decreased step length - left;Trunk flexed Gait velocity: decreased   General Gait Details: Slow cadence with short B step length but steady without LOB; min verbal cues for upright posture and amb closer to the [employer]    Modified Rankin (Stroke Patients Only)       Balance Overall balance assessment: Needs assistance Sitting-balance support: Feet supported;Single extremity supported Sitting balance-Leahy Scale: Good     Standing balance support: During functional activity;Bilateral upper extremity supported Standing balance-Leahy Scale: Good Standing balance comment: Min BUE lean on RW for balance during ambulation                            Cognition Arousal/Alertness: Awake/alert Behavior During Therapy: WFL for tasks assessed/performed Overall Cognitive Status: Within Functional Limits for tasks assessed                                        Exercises Total Joint Exercises Ankle Circles/Pumps: Strengthening;Both;10 reps;5 reps Quad Sets: Strengthening;Both;10 reps;5 reps Gluteal Sets:  Strengthening;Both;10 reps;5 reps Towel Squeeze: Strengthening;Both;10 reps Hip ABduction/ADduction: AROM;Both;10 reps Long Arc Quad: AROM;Both;10 reps;Strengthening Knee Flexion: AROM;Strengthening;Both;10 reps    General Comments        Pertinent Vitals/Pain Pain Assessment: No/denies pain    Home Living                      Prior Function            PT Goals (current goals can now be found in the care plan section) Progress towards PT  goals: Progressing toward goals    Frequency    Min 2X/week      PT Plan Current plan remains appropriate    Co-evaluation              AM-PAC PT "6 Clicks" Mobility   Outcome Measure  Help needed turning from your back to your side while in a flat bed without using bedrails?: A Little Help needed moving from lying on your back to sitting on the side of a flat bed without using bedrails?: A Little Help needed moving to and from a bed to a chair (including a wheelchair)?: A Little Help needed standing up from a chair using your arms (e.g., wheelchair or bedside chair)?: A Little Help needed to walk in hospital room?: A Little Help needed climbing 3-5 steps with a railing? : A Little 6 Click Score: 18    End of Session Equipment Utilized During Treatment: Gait belt;Other (comment)(Gait belt under the arms to avoid incision) Activity Tolerance: Patient tolerated treatment well Patient left: in chair;with call bell/phone within reach;with chair alarm set Nurse Communication: Mobility status PT Visit Diagnosis: Difficulty in walking, not elsewhere classified (R26.2);Muscle weakness (generalized) (M62.81)     Time: [id number] PT Time Calculation (min) (ACUTE ONLY): 25 min  Charges:  $Gait Training: 8-22 mins $Therapeutic Exercise: 8-22 mins                     D. Scott Nawal Burling PT, DPT 04/15/19, 1:32 PM

## 2019-04-15 NOTE — Progress Notes (Signed)
Hollie Salk to be D/C'd Home per MD order.  Discussed prescriptions and follow up appointments with the patient. Prescriptions given to patient, medication list explained in detail. Pt verbalized understanding.  Allergies as of 04/15/2019   No Known Allergies     Medication List    TAKE these medications   albuterol 108 (90 Base) MCG/ACT inhaler Commonly known as: VENTOLIN HFA Inhale 2 puffs into the lungs every 6 (six) hours as needed for wheezing or shortness of breath.   albuterol (2.5 MG/3ML) 0.083% nebulizer solution Commonly known as: PROVENTIL Inhale 3 mLs into the lungs every 4 (four) hours as needed for wheezing or shortness of breath.   aspirin EC 81 MG tablet Take 81 mg by mouth daily.   atorvastatin 40 MG tablet Commonly known as: LIPITOR Take 40 mg by mouth daily.   carvedilol 6.25 MG tablet Commonly known as: COREG Take 6.25 mg by mouth 2 (two) times daily with a meal.   ciprofloxacin 500 MG tablet Commonly known as: Cipro Take 1 tablet (500 mg total) by mouth 2 (two) times daily for 7 days.   fluconazole 50 MG tablet Commonly known as: DIFLUCAN Take 1 tablet (50 mg total) by mouth daily for 5 days. Start taking on: April 16, 2019   Fluticasone-Salmeterol 100-50 MCG/DOSE Aepb Commonly known as: Advair Diskus Inhale 1 puff into the lungs 2 (two) times daily.   glipiZIDE 2.5 MG 24 hr tablet Commonly known as: GLUCOTROL XL Take 2.5 mg by mouth daily with breakfast.   ibuprofen 800 MG tablet Commonly known as: ADVIL Take 1 tablet (800 mg total) by mouth every 8 (eight) hours as needed.   ipratropium-albuterol 0.5-2.5 (3) MG/3ML Soln Commonly known as: DUONEB Inhale 3 mLs into the lungs every 6 (six) hours as needed for wheezing.   lisinopril 10 MG tablet Commonly known as: ZESTRIL Take 10 mg by mouth daily.   metFORMIN 500 MG 24 hr tablet Commonly known as: GLUCOPHAGE-XR Take 1,000 mg by mouth every evening.   Multi-Vitamin tablet Take 1  tablet by mouth daily.   oxyCODONE 5 MG immediate release tablet Commonly known as: Oxy IR/ROXICODONE Take 1 tablet (5 mg total) by mouth every 6 (six) hours as needed for severe pain.   tamsulosin 0.4 MG Caps capsule Commonly known as: FLOMAX Take 0.4 mg by mouth daily.   tiotropium 18 MCG inhalation capsule Commonly known as: Spiriva HandiHaler Place 1 capsule (18 mcg total) into inhaler and inhale daily.   traMADol 50 MG tablet Commonly known as: Ultram Take 1 tablet (50 mg total) by mouth every 12 (twelve) hours as needed.            Durable Medical Equipment  (From admission, onward)         Start     Ordered   04/10/19 1432  For home use only DME Bedside commode  Once    Question:  Patient needs a bedside commode to treat with the following condition  Answer:  Weakness   04/10/19 1431          Vitals:   04/15/19 1201 04/15/19 1645  BP: (!) 111/49 (!) 136/59  Pulse: 94 94  Resp: 18   Temp: 98 F (36.7 C)   SpO2: 96%     Skin clean, dry and intact without evidence of skin break down, no evidence of skin tears noted. IV catheter discontinued intact. Site without signs and symptoms of complications. Dressing and pressure applied. Pt denies pain at  this time. No complaints noted.  An After Visit Summary was printed and given to the patient. Patient escorted via [place], and D/C home via private auto.  Micheal [name]

## 2019-04-15 NOTE — Progress Notes (Signed)
[facility] Day(s): 21.   Post op day(s): 16 Days Post-Op.   Interval History: Patient seen and examined no acute events or new complaints overnight.  Patient reports he is feeling better. No complaints of abdominal pain, nausea, emesis, or distension. He continues to endorse a burning sensation with urination. His UA did show budding yeast.  K+ (5.2) this morning Renal function improved, U/O 3.0L Leukocytosis improving (12.7K) Regular diet, + BM   Vital signs in last 24 hours: [min-max] current  Temp:  [97.4 F (36.3 C)-98.5 F (36.9 C)] 97.4 F (36.3 C) (11/25 0518) Pulse Rate:  [77-89] 77 (11/25 0518) Resp:  [18] 18 (11/24 2046) BP: (127-137)/(53-69) 130/69 (11/25 0518) SpO2:  [95 %-97 %] 95 % (11/25 0518)     Height: 5\' 1"  (154.9 cm) Weight: 63.5 kg BMI (Calculated): 26.47   Intake/Output last 2 shifts:  11/24 0701 - 11/25 0700 In: 2256.4 [P.O.:480; I.V.:1776.4] Out: 3000 [Urine:3000]   Physical Exam:  Constitutional: alert, cooperative and no distress  Respiratory: breathing non-labored at rest  Cardiovascular: regular rate and sinus rhythm  Gastrointestinal: soft, non-tender,non-distended, no rebound/guarding.No peritonitis Integumentary:Laparotomy incision is healing well, staples out, no erythema or drainage. Vesicle lesion on LLE are crusted over   Labs:  CBC Latest Ref Rng & Units 04/15/2019 04/14/2019 04/13/2019  WBC 4.0 - 10.5 K/uL 12.7(H) 13.2(H) 16.0(H)  Hemoglobin 13.0 - 17.0 g/dL 8.9(L) 8.4(L) 8.3(L)  Hematocrit 39.0 - 52.0 % 28.4(L) 25.7(L) 26.7(L)  Platelets 150 - 400 K/uL 301 262 268   CMP Latest Ref Rng & Units 04/15/2019 04/14/2019 04/13/2019  Glucose 70 - 99 mg/dL 287(H) 274(H) 178(H)  BUN 8 - 23 mg/dL 42(H) 46(H) 47(H)  Creatinine 0.61 - 1.24 mg/dL 1.24 1.39(H) 1.48(H)  Sodium 135 - 145 mmol/L 134(L) 132(L) 134(L)  Potassium 3.5 - 5.1 mmol/L 5.2(H) 5.1 5.3(H)  Chloride 98 - 111 mmol/L 98  97(L) 99  CO2 22 - 32 mmol/L 29 29 28   Calcium 8.9 - 10.3 mg/dL 8.3(L) 8.4(L) 8.5(L)  Total Protein 6.5 - 8.1 g/dL - - -  Total Bilirubin 0.3 - 1.2 mg/dL - - -  Alkaline Phos 38 - 126 U/L - - -  AST 15 - 41 U/L - - -  ALT 0 - 44 U/L - - -     Imaging studies: No new pertinent imaging studies   Assessment/Plan:  85 y.o. male 16 Days Post-Op s/p exploratory laparotomy and lysis of adhesionsfor small bowel obstruction   - Continue regular diet               - Completed ABx  - Added Fluconazole to regimen given yeast in urine             - Continue IVF resuscitation and K+ correction; monitor             - pain control prn; antiemetics prn             - monitor abdominal examiantion             - Continue mobilization: PT following; recommending HHPT             - medical management of comorbidities per medicine; appreciate their assistance   All of the above findings and recommendations were discussed with the patient, and the medical team, and all of patient's questions were answered to his expressed satisfaction.  -- Edison Simon, PA-C [facility] 04/15/2019, 7:28 AM [phone] M-F:  7am - 4pm

## 2019-04-16 LAB — URINE CULTURE: Culture: >=100000 — AB

## 2019-04-29 ENCOUNTER — Encounter: Payer: Medicare Other | Admitting: Surgery

## 2019-04-30 ENCOUNTER — Telehealth: Payer: Self-pay

## 2019-05-01 ENCOUNTER — Encounter: Payer: Self-pay | Admitting: Physician Assistant

## 2019-05-01 ENCOUNTER — Ambulatory Visit (INDEPENDENT_AMBULATORY_CARE_PROVIDER_SITE_OTHER): Payer: Medicare Other | Admitting: Physician Assistant

## 2019-05-01 ENCOUNTER — Other Ambulatory Visit: Payer: Self-pay

## 2019-05-01 VITALS — BP 130/68 | HR 105 | Temp 99.0°F | Resp 16 | Ht 67.0 in | Wt 130.2 lb

## 2019-05-01 DIAGNOSIS — K56609 Unspecified intestinal obstruction, unspecified as to partial versus complete obstruction: Secondary | ICD-10-CM

## 2019-05-01 DIAGNOSIS — Z09 Encounter for follow-up examination after completed treatment for conditions other than malignant neoplasm: Secondary | ICD-10-CM

## 2019-05-01 NOTE — Patient Instructions (Addendum)
You can buy Ensure Nutrition Drink or Protein powder over the counter to help build up your appetite and get protein in your system.  Keep your appointment with your Primary Care Provider on Wednesday 05/06/19. Please continue to eat 3 meals a day to get your calories in to obtain your strength back.  Please see your appointments below.   GENERAL POST-OPERATIVE PATIENT INSTRUCTIONS   WOUND CARE INSTRUCTIONS:  Keep a dry clean dressing on the wound if there is drainage. The initial bandage may be removed after 24 hours.  Once the wound has quit draining you may leave it open to air.  If clothing rubs against the wound or causes irritation and the wound is not draining you may cover it with a dry dressing during the daytime.  Try to keep the wound dry and avoid ointments on the wound unless directed to do so.  If the wound becomes bright red and painful or starts to drain infected material that is not clear, please contact your physician immediately.  If the wound is mildly pink and has a thick firm ridge underneath it, this is normal, and is referred to as a healing ridge.  This will resolve over the next 4-6 weeks.  BATHING: You may shower if you have been informed of this by your surgeon. However, Please do not submerge in a tub, hot tub, or pool until incisions are completely sealed or have been told by your surgeon that you may do so.  DIET:  You may eat any foods that you can tolerate.  It is a good idea to eat a high fiber diet and take in plenty of fluids to prevent constipation.  If you do become constipated you may want to take a mild laxative or take ducolax tablets on a daily basis until your bowel habits are regular.  Constipation can be very uncomfortable, along with straining, after recent surgery.  ACTIVITY:  You are encouraged to cough and deep breath or use your incentive spirometer if you were given one, every 15-30 minutes when awake.  This will help prevent respiratory  complications and low grade fevers post-operatively if you had a general anesthetic.  You may want to hug a pillow when coughing and sneezing to add additional support to the surgical area, if you had abdominal or chest surgery, which will decrease pain during these times.  You are encouraged to walk and engage in light activity for the next two weeks.  You should not lift more than 20 pounds 6 weeks after your surgery as it could put you at increased risk for complications.  Twenty pounds is roughly equivalent to a plastic bag of groceries. At that time- Listen to your body when lifting, if you have pain when lifting, stop and then try again in a few days. Soreness after doing exercises or activities of daily living is normal as you get back in to your normal routine.  MEDICATIONS:  Try to take narcotic medications and anti-inflammatory medications, such as tylenol, ibuprofen, naprosyn, etc., with food.  This will minimize stomach upset from the medication.  Should you develop nausea and vomiting from the pain medication, or develop a rash, please discontinue the medication and contact your physician.  You should not drive, make important decisions, or operate machinery when taking narcotic pain medication.  SUNBLOCK Use sun block to incision area over the next year if this area will be exposed to sun. This helps decrease scarring and will allow you avoid a  permanent darkened area over your incision.  QUESTIONS:  Please feel free to call our office if you have any questions, and we will be glad to assist you.
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
## 2019-05-05 NOTE — Telephone Encounter (Signed)
error 

## 2019-05-19 ENCOUNTER — Ambulatory Visit (INDEPENDENT_AMBULATORY_CARE_PROVIDER_SITE_OTHER): Payer: Self-pay | Admitting: Surgery

## 2019-05-19 ENCOUNTER — Other Ambulatory Visit: Payer: Self-pay

## 2019-05-19 ENCOUNTER — Encounter: Payer: Self-pay | Admitting: Surgery

## 2019-05-19 VITALS — BP 108/67 | HR 78 | Temp 97.9°F | Ht 67.0 in | Wt 130.2 lb

## 2019-05-19 DIAGNOSIS — Z09 Encounter for follow-up examination after completed treatment for conditions other than malignant neoplasm: Secondary | ICD-10-CM

## 2019-05-19 DIAGNOSIS — K56609 Unspecified intestinal obstruction, unspecified as to partial versus complete obstruction: Secondary | ICD-10-CM

## 2019-05-19 NOTE — Patient Instructions (Signed)
   Follow-up with our office as needed.  Please call and ask to speak with a nurse if you develop questions or concerns.  

## 2019-05-19 NOTE — Progress Notes (Signed)
05/19/2019  HPI: TAELYN AMAN is a 85 y.o. male s/p exploratory laparotomy with lysis of adhesions on 11/9.  Patient presents today for follow-up.  He was last seen on 12/11.  At that point he was describing still having lower energy and appetite but otherwise has been feeling well.  Today patient continues to do well and although still has low appetite and his energy has been improving.  He has been ambulating better and now is using a cane instead of a walker.  He denies having any further coughing and denies any abdominal pain.  Vital signs: BP 108/67   Pulse 78   Temp 97.9 F (36.6 C)   Ht 5\' 7"  (1.702 m)   Wt 59.1 kg   SpO2 92%   BMI 20.39 kg/m    Physical Exam: Constitutional: No acute distress Abdomen: Soft, nondistended, nontender to palpation.  Midline incision is healed well with no evidence of hernia or infection.  Assessment/Plan: This is a 85 y.o. male s/p exploratory laparotomy with lysis of adhesions.  -Patient is doing very well and reassured him that as his energy continues to improve his appetite will also improve as well.  Recommended that he make a more conscious effort to eat more often as well as increase his Ensure intake to help with calorie intake and proteins. -He is already passed a 6 weeks of no heavy lifting or pushing restrictions.  He has no activity restrictions at this point. -He may follow-up as needed.   Melvyn Neth, [facility]
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
## 2022-02-27 ENCOUNTER — Emergency Department: Payer: Medicare PPO

## 2022-02-27 ENCOUNTER — Observation Stay
Admission: EM | Admit: 2022-02-27 | Discharge: 2022-03-02 | Disposition: A | Discharge status: Home / Self Care | Payer: Medicare PPO | Attending: Hospitalist | Admitting: Hospitalist

## 2022-02-27 ENCOUNTER — Other Ambulatory Visit: Payer: Self-pay

## 2022-02-27 ENCOUNTER — Encounter: Payer: Self-pay | Admitting: Emergency Medicine

## 2022-02-27 DIAGNOSIS — R197 Diarrhea, unspecified: Secondary | ICD-10-CM | POA: Diagnosis present

## 2022-02-27 DIAGNOSIS — K529 Noninfective gastroenteritis and colitis, unspecified: Secondary | ICD-10-CM

## 2022-02-27 DIAGNOSIS — D696 Thrombocytopenia, unspecified: Secondary | ICD-10-CM | POA: Diagnosis not present

## 2022-02-27 DIAGNOSIS — F1010 Alcohol abuse, uncomplicated: Secondary | ICD-10-CM | POA: Diagnosis present

## 2022-02-27 DIAGNOSIS — N1832 Chronic kidney disease, stage 3b: Secondary | ICD-10-CM | POA: Diagnosis not present

## 2022-02-27 DIAGNOSIS — K2960 Other gastritis without bleeding: Secondary | ICD-10-CM | POA: Diagnosis present

## 2022-02-27 DIAGNOSIS — D127 Benign neoplasm of rectosigmoid junction: Secondary | ICD-10-CM | POA: Diagnosis present

## 2022-02-27 DIAGNOSIS — R062 Wheezing: Secondary | ICD-10-CM | POA: Diagnosis not present

## 2022-02-27 DIAGNOSIS — Z7984 Long term (current) use of oral hypoglycemic drugs: Secondary | ICD-10-CM | POA: Insufficient documentation

## 2022-02-27 DIAGNOSIS — I252 Old myocardial infarction: Secondary | ICD-10-CM

## 2022-02-27 DIAGNOSIS — I2510 Atherosclerotic heart disease of native coronary artery without angina pectoris: Secondary | ICD-10-CM | POA: Diagnosis present

## 2022-02-27 DIAGNOSIS — R739 Hyperglycemia, unspecified: Secondary | ICD-10-CM

## 2022-02-27 DIAGNOSIS — Z8701 Personal history of pneumonia (recurrent): Secondary | ICD-10-CM

## 2022-02-27 DIAGNOSIS — D631 Anemia in chronic kidney disease: Secondary | ICD-10-CM | POA: Diagnosis present

## 2022-02-27 DIAGNOSIS — K922 Gastrointestinal hemorrhage, unspecified: Secondary | ICD-10-CM | POA: Diagnosis not present

## 2022-02-27 DIAGNOSIS — E7800 Pure hypercholesterolemia, unspecified: Secondary | ICD-10-CM | POA: Diagnosis present

## 2022-02-27 DIAGNOSIS — K3189 Other diseases of stomach and duodenum: Secondary | ICD-10-CM | POA: Diagnosis not present

## 2022-02-27 DIAGNOSIS — Z87891 Personal history of nicotine dependence: Secondary | ICD-10-CM | POA: Insufficient documentation

## 2022-02-27 DIAGNOSIS — Z8719 Personal history of other diseases of the digestive system: Secondary | ICD-10-CM

## 2022-02-27 DIAGNOSIS — J449 Chronic obstructive pulmonary disease, unspecified: Secondary | ICD-10-CM | POA: Diagnosis not present

## 2022-02-27 DIAGNOSIS — E1165 Type 2 diabetes mellitus with hyperglycemia: Secondary | ICD-10-CM | POA: Diagnosis not present

## 2022-02-27 DIAGNOSIS — E785 Hyperlipidemia, unspecified: Secondary | ICD-10-CM | POA: Diagnosis present

## 2022-02-27 DIAGNOSIS — Z7982 Long term (current) use of aspirin: Secondary | ICD-10-CM | POA: Insufficient documentation

## 2022-02-27 DIAGNOSIS — K635 Polyp of colon: Secondary | ICD-10-CM | POA: Insufficient documentation

## 2022-02-27 DIAGNOSIS — Z681 Body mass index (BMI) 19 or less, adult: Secondary | ICD-10-CM

## 2022-02-27 DIAGNOSIS — E43 Unspecified severe protein-calorie malnutrition: Secondary | ICD-10-CM | POA: Diagnosis not present

## 2022-02-27 DIAGNOSIS — K5730 Diverticulosis of large intestine without perforation or abscess without bleeding: Secondary | ICD-10-CM | POA: Diagnosis not present

## 2022-02-27 DIAGNOSIS — N179 Acute kidney failure, unspecified: Secondary | ICD-10-CM | POA: Diagnosis not present

## 2022-02-27 DIAGNOSIS — E1122 Type 2 diabetes mellitus with diabetic chronic kidney disease: Secondary | ICD-10-CM | POA: Insufficient documentation

## 2022-02-27 DIAGNOSIS — E440 Moderate protein-calorie malnutrition: Secondary | ICD-10-CM

## 2022-02-27 DIAGNOSIS — B961 Klebsiella pneumoniae [K. pneumoniae] as the cause of diseases classified elsewhere: Secondary | ICD-10-CM | POA: Diagnosis present

## 2022-02-27 DIAGNOSIS — N323 Diverticulum of bladder: Secondary | ICD-10-CM | POA: Diagnosis present

## 2022-02-27 DIAGNOSIS — E1100 Type 2 diabetes mellitus with hyperosmolarity without nonketotic hyperglycemic-hyperosmolar coma (NKHHC): Secondary | ICD-10-CM | POA: Diagnosis not present

## 2022-02-27 DIAGNOSIS — I129 Hypertensive chronic kidney disease with stage 1 through stage 4 chronic kidney disease, or unspecified chronic kidney disease: Secondary | ICD-10-CM | POA: Diagnosis not present

## 2022-02-27 DIAGNOSIS — Z7409 Other reduced mobility: Secondary | ICD-10-CM | POA: Insufficient documentation

## 2022-02-27 DIAGNOSIS — Z7951 Long term (current) use of inhaled steroids: Secondary | ICD-10-CM

## 2022-02-27 DIAGNOSIS — Z79899 Other long term (current) drug therapy: Secondary | ICD-10-CM | POA: Insufficient documentation

## 2022-02-27 DIAGNOSIS — R634 Abnormal weight loss: Secondary | ICD-10-CM | POA: Diagnosis not present

## 2022-02-27 DIAGNOSIS — N390 Urinary tract infection, site not specified: Secondary | ICD-10-CM | POA: Diagnosis present

## 2022-02-27 DIAGNOSIS — K6389 Other specified diseases of intestine: Secondary | ICD-10-CM | POA: Diagnosis not present

## 2022-02-27 DIAGNOSIS — R2681 Unsteadiness on feet: Secondary | ICD-10-CM | POA: Insufficient documentation

## 2022-02-27 DIAGNOSIS — E871 Hypo-osmolality and hyponatremia: Secondary | ICD-10-CM | POA: Diagnosis present

## 2022-02-27 DIAGNOSIS — E875 Hyperkalemia: Secondary | ICD-10-CM | POA: Diagnosis present

## 2022-02-27 DIAGNOSIS — D490 Neoplasm of unspecified behavior of digestive system: Secondary | ICD-10-CM | POA: Insufficient documentation

## 2022-02-27 DIAGNOSIS — J439 Emphysema, unspecified: Secondary | ICD-10-CM | POA: Diagnosis present

## 2022-02-27 DIAGNOSIS — Z955 Presence of coronary angioplasty implant and graft: Secondary | ICD-10-CM | POA: Insufficient documentation

## 2022-02-27 DIAGNOSIS — Z794 Long term (current) use of insulin: Secondary | ICD-10-CM | POA: Diagnosis not present

## 2022-02-27 DIAGNOSIS — Z87440 Personal history of urinary (tract) infections: Secondary | ICD-10-CM

## 2022-02-27 DIAGNOSIS — I10 Essential (primary) hypertension: Secondary | ICD-10-CM | POA: Diagnosis present

## 2022-02-27 DIAGNOSIS — K219 Gastro-esophageal reflux disease without esophagitis: Secondary | ICD-10-CM | POA: Diagnosis present

## 2022-02-27 DIAGNOSIS — Z720 Tobacco use: Secondary | ICD-10-CM | POA: Diagnosis present

## 2022-02-27 LAB — GASTROINTESTINAL PANEL BY PCR, STOOL (REPLACES STOOL CULTURE)

## 2022-02-27 LAB — HEMOGLOBIN A1C
Hgb A1c MFr Bld: 10.8 % — ABNORMAL HIGH (ref 4.8–5.6)
Mean Plasma Glucose: 263.26 mg/dL

## 2022-02-27 LAB — BASIC METABOLIC PANEL
Anion gap: 10 (ref 5–15)
Anion gap: 14 (ref 5–15)
Anion gap: 6 (ref 5–15)
BUN: 42 mg/dL — ABNORMAL HIGH (ref 8–23)
BUN: 39 mg/dL — ABNORMAL HIGH (ref 8–23)
BUN: 37 mg/dL — ABNORMAL HIGH (ref 8–23)
CO2: 22 mmol/L (ref 22–32)
CO2: 18 mmol/L — ABNORMAL LOW (ref 22–32)
CO2: 24 mmol/L (ref 22–32)
Calcium: 8.7 mg/dL — ABNORMAL LOW (ref 8.9–10.3)
Calcium: 8.6 mg/dL — ABNORMAL LOW (ref 8.9–10.3)
Calcium: 8.4 mg/dL — ABNORMAL LOW (ref 8.9–10.3)
Chloride: 96 mmol/L — ABNORMAL LOW (ref 98–111)
Chloride: 102 mmol/L (ref 98–111)
Chloride: 105 mmol/L (ref 98–111)
Creatinine, Ser: 1.76 mg/dL — ABNORMAL HIGH (ref 0.61–1.24)
Creatinine, Ser: 1.59 mg/dL — ABNORMAL HIGH (ref 0.61–1.24)
Creatinine, Ser: 1.42 mg/dL — ABNORMAL HIGH (ref 0.61–1.24)
GFR, Estimated: 37 mL/min — ABNORMAL LOW (ref >60)
GFR, Estimated: 42 mL/min — ABNORMAL LOW (ref >60)
GFR, Estimated: 48 mL/min — ABNORMAL LOW (ref >60)
Glucose, Bld: 547 mg/dL (ref 70–99)
Glucose, Bld: 248 mg/dL — ABNORMAL HIGH (ref 70–99)
Glucose, Bld: 182 mg/dL — ABNORMAL HIGH (ref 70–99)
Potassium: 6.4 mmol/L (ref 3.5–5.1)
Potassium: 4.8 mmol/L (ref 3.5–5.1)
Potassium: 4.7 mmol/L (ref 3.5–5.1)
Sodium: 128 mmol/L — ABNORMAL LOW (ref 135–145)
Sodium: 134 mmol/L — ABNORMAL LOW (ref 135–145)
Sodium: 135 mmol/L (ref 135–145)

## 2022-02-27 LAB — COMPREHENSIVE METABOLIC PANEL
ALT: 9 U/L (ref 0–44)
AST: 9 U/L — ABNORMAL LOW (ref 15–41)
Albumin: 3.1 g/dL — ABNORMAL LOW (ref 3.5–5.0)
Alkaline Phosphatase: 75 U/L (ref 38–126)
Anion gap: 10 (ref 5–15)
BUN: 46 mg/dL — ABNORMAL HIGH (ref 8–23)
CO2: 20 mmol/L — ABNORMAL LOW (ref 22–32)
Calcium: 8.4 mg/dL — ABNORMAL LOW (ref 8.9–10.3)
Chloride: 95 mmol/L — ABNORMAL LOW (ref 98–111)
Creatinine, Ser: 1.68 mg/dL — ABNORMAL HIGH (ref 0.61–1.24)
GFR, Estimated: 39 mL/min — ABNORMAL LOW (ref >60)
Glucose, Bld: 630 mg/dL (ref 70–99)
Potassium: 5.9 mmol/L — ABNORMAL HIGH (ref 3.5–5.1)
Sodium: 125 mmol/L — ABNORMAL LOW (ref 135–145)
Total Bilirubin: 0.8 mg/dL (ref 0.3–1.2)
Total Protein: 6.6 g/dL (ref 6.5–8.1)

## 2022-02-27 LAB — GLUCOSE, CAPILLARY
Glucose-Capillary: 122 mg/dL — ABNORMAL HIGH (ref 70–99)
Glucose-Capillary: 132 mg/dL — ABNORMAL HIGH (ref 70–99)
Glucose-Capillary: 139 mg/dL — ABNORMAL HIGH (ref 70–99)
Glucose-Capillary: 167 mg/dL — ABNORMAL HIGH (ref 70–99)
Glucose-Capillary: 206 mg/dL — ABNORMAL HIGH (ref 70–99)
Glucose-Capillary: 207 mg/dL — ABNORMAL HIGH (ref 70–99)
Glucose-Capillary: 290 mg/dL — ABNORMAL HIGH (ref 70–99)

## 2022-02-27 LAB — CBC WITH DIFFERENTIAL/PLATELET
Abs Immature Granulocytes: 0.03 10*3/uL (ref 0.00–0.07)
Basophils Absolute: 0 10*3/uL (ref 0.0–0.1)
Basophils Relative: 1 %
Eosinophils Absolute: 0 10*3/uL (ref 0.0–0.5)
Eosinophils Relative: 0 %
HCT: 33.4 % — ABNORMAL LOW (ref 39.0–52.0)
Hemoglobin: 10.6 g/dL — ABNORMAL LOW (ref 13.0–17.0)
Immature Granulocytes: 0 %
Lymphocytes Relative: 29 %
Lymphs Abs: 2.2 10*3/uL (ref 0.7–4.0)
MCH: 29.7 pg (ref 26.0–34.0)
MCHC: 31.7 g/dL (ref 30.0–36.0)
MCV: 93.6 fL (ref 80.0–100.0)
Monocytes Absolute: 0.4 10*3/uL (ref 0.1–1.0)
Monocytes Relative: 5 %
Neutro Abs: 5 10*3/uL (ref 1.7–7.7)
Neutrophils Relative %: 65 %
Platelets: 97 10*3/uL — ABNORMAL LOW (ref 150–400)
RBC: 3.57 MIL/uL — ABNORMAL LOW (ref 4.22–5.81)
RDW: 14.4 % (ref 11.5–15.5)
WBC: 7.6 10*3/uL (ref 4.0–10.5)
nRBC: 0 % (ref 0.0–0.2)

## 2022-02-27 LAB — TECHNOLOGIST SMEAR REVIEW: Plt Morphology: NORMAL

## 2022-02-27 LAB — OSMOLALITY
Osmolality: 316 mOsm/kg — ABNORMAL HIGH (ref 275–295)
Osmolality: 296 mOsm/kg — ABNORMAL HIGH (ref 275–295)

## 2022-02-27 LAB — URINALYSIS, ROUTINE W REFLEX MICROSCOPIC
Bacteria, UA: NONE SEEN
Bilirubin Urine: NEGATIVE
Glucose, UA: >=500 mg/dL — AB
Hgb urine dipstick: NEGATIVE
Ketones, ur: NEGATIVE mg/dL
Nitrite: NEGATIVE
Protein, ur: 30 mg/dL — AB
Specific Gravity, Urine: 1.02 (ref 1.005–1.030)
Squamous Epithelial / HPF: NONE SEEN (ref 0–5)
WBC, UA: >50 WBC/hpf — ABNORMAL HIGH (ref 0–5)
pH: 5 (ref 5.0–8.0)

## 2022-02-27 LAB — CBG MONITORING, ED
Glucose-Capillary: 518 mg/dL (ref 70–99)
Glucose-Capillary: 471 mg/dL — ABNORMAL HIGH (ref 70–99)
Glucose-Capillary: 425 mg/dL — ABNORMAL HIGH (ref 70–99)
Glucose-Capillary: 366 mg/dL — ABNORMAL HIGH (ref 70–99)

## 2022-02-27 LAB — BETA-HYDROXYBUTYRIC ACID: Beta-Hydroxybutyric Acid: 0.35 mmol/L — ABNORMAL HIGH (ref 0.05–0.27)

## 2022-02-27 LAB — BRAIN NATRIURETIC PEPTIDE: B Natriuretic Peptide: 137.5 pg/mL — ABNORMAL HIGH (ref 0.0–100.0)

## 2022-02-27 LAB — C DIFFICILE QUICK SCREEN W PCR REFLEX
C Diff antigen: NEGATIVE
C Diff interpretation: NOT DETECTED
C Diff toxin: NEGATIVE

## 2022-02-27 LAB — MRSA NEXT GEN BY PCR, NASAL: MRSA by PCR Next Gen: NOT DETECTED

## 2022-02-27 LAB — LACTATE DEHYDROGENASE: LDH: 97 U/L — ABNORMAL LOW (ref 98–192)

## 2022-02-27 LAB — LACTIC ACID, PLASMA: Lactic Acid, Venous: 1.7 mmol/L (ref 0.5–1.9)

## 2022-02-27 MED ORDER — NICOTINE 21 MG/24HR TD PT24
21.0000 mg | MEDICATED_PATCH | Freq: Every day | TRANSDERMAL | Status: DC
  Filled 2022-02-27: qty 1

## 2022-02-27 MED ORDER — HYDRALAZINE HCL 20 MG/ML IJ SOLN: 5.0000 mg | INTRAMUSCULAR | Status: DC | PRN

## 2022-02-27 MED ORDER — ACETAMINOPHEN 325 MG PO TABS
650.0000 mg | ORAL_TABLET | Freq: Four times a day (QID) | ORAL | Status: DC | PRN
  Administered 2022-02-27: 650 mg via ORAL
  Filled 2022-02-27: qty 2

## 2022-02-27 MED ORDER — ORAL CARE MOUTH RINSE: 15.0000 mL | OROMUCOSAL | Status: DC | PRN

## 2022-02-27 MED ORDER — PHENAZOPYRIDINE HCL 200 MG PO TABS
200.0000 mg | ORAL_TABLET | Freq: Once | ORAL | Status: AC
  Administered 2022-02-28: 200 mg via ORAL
  Filled 2022-02-27: qty 1

## 2022-02-27 MED ORDER — IPRATROPIUM-ALBUTEROL 0.5-2.5 (3) MG/3ML IN SOLN
3.0000 mL | Freq: Once | RESPIRATORY_TRACT | Status: AC
  Administered 2022-02-27: 3 mL via RESPIRATORY_TRACT
  Filled 2022-02-27: qty 3

## 2022-02-27 MED ORDER — CALCIUM GLUCONATE-NACL 1-0.675 GM/50ML-% IV SOLN
1.0000 g | Freq: Once | INTRAVENOUS | Status: AC
  Administered 2022-02-27: 1000 mg via INTRAVENOUS
  Filled 2022-02-27: qty 50

## 2022-02-27 MED ORDER — IPRATROPIUM-ALBUTEROL 0.5-2.5 (3) MG/3ML IN SOLN
3.0000 mL | Freq: Three times a day (TID) | RESPIRATORY_TRACT | Status: DC
  Administered 2022-02-28 (×2): 3 mL via RESPIRATORY_TRACT
  Filled 2022-02-27 (×4): qty 3

## 2022-02-27 MED ORDER — PREDNISONE 20 MG PO TABS
40.0000 mg | ORAL_TABLET | Freq: Once | ORAL | Status: AC
  Administered 2022-02-27: 40 mg via ORAL
  Filled 2022-02-27: qty 2

## 2022-02-27 MED ORDER — INSULIN REGULAR(HUMAN) IN NACL 100-0.9 UT/100ML-% IV SOLN
INTRAVENOUS | Status: AC
  Administered 2022-02-27: 8 [IU]/h via INTRAVENOUS
  Filled 2022-02-27: qty 100

## 2022-02-27 MED ORDER — DEXTROSE 50 % IV SOLN: 0.0000 mL | INTRAVENOUS | Status: DC | PRN

## 2022-02-27 MED ORDER — ENSURE ENLIVE PO LIQD: 237.0000 mL | Freq: Two times a day (BID) | ORAL | Status: DC

## 2022-02-27 MED ORDER — LACTATED RINGERS IV BOLUS
20.0000 mL/kg | Freq: Once | INTRAVENOUS | Status: AC
  Administered 2022-02-27: 1062 mL via INTRAVENOUS

## 2022-02-27 MED ORDER — LOSARTAN POTASSIUM 50 MG PO TABS
50.0000 mg | ORAL_TABLET | Freq: Every day | ORAL | Status: DC
  Filled 2022-02-27: qty 1

## 2022-02-27 MED ORDER — SODIUM ZIRCONIUM CYCLOSILICATE 5 G PO PACK
5.0000 g | PACK | Freq: Once | ORAL | Status: DC
  Filled 2022-02-27: qty 1

## 2022-02-27 MED ORDER — ONDANSETRON HCL 4 MG/2ML IJ SOLN: 4.0000 mg | Freq: Three times a day (TID) | INTRAMUSCULAR | Status: DC | PRN

## 2022-02-27 MED ORDER — DM-GUAIFENESIN ER 30-600 MG PO TB12: 1.0000 | ORAL_TABLET | Freq: Two times a day (BID) | ORAL | Status: DC | PRN

## 2022-02-27 MED ORDER — SODIUM CHLORIDE 0.9 % IV BOLUS
500.0000 mL | Freq: Once | INTRAVENOUS | Status: AC
  Administered 2022-02-27: 500 mL via INTRAVENOUS

## 2022-02-27 MED ORDER — DEXTROSE IN LACTATED RINGERS 5 % IV SOLN: INTRAVENOUS | Status: AC

## 2022-02-27 MED ORDER — PREDNISONE 10 MG PO TABS
40.0000 mg | ORAL_TABLET | Freq: Every day | ORAL | Status: DC
  Administered 2022-02-28: 40 mg via ORAL
  Filled 2022-02-27: qty 4

## 2022-02-27 MED ORDER — SODIUM CHLORIDE 0.9 % IV SOLN
1.0000 g | INTRAVENOUS | Status: DC
  Administered 2022-02-27 – 2022-03-01 (×3): 1 g via INTRAVENOUS
  Filled 2022-02-27: qty 10
  Filled 2022-02-27 (×2): qty 1

## 2022-02-27 MED ORDER — PANCRELIPASE (LIP-PROT-AMYL) 12000-38000 UNITS PO CPEP: 36000.0000 [IU] | ORAL_CAPSULE | Freq: Three times a day (TID) | ORAL | Status: DC

## 2022-02-27 MED ORDER — IPRATROPIUM-ALBUTEROL 0.5-2.5 (3) MG/3ML IN SOLN
3.0000 mL | RESPIRATORY_TRACT | Status: DC
  Administered 2022-02-27 (×2): 3 mL via RESPIRATORY_TRACT
  Filled 2022-02-27 (×2): qty 3

## 2022-02-27 MED ORDER — SODIUM CHLORIDE 0.9 % IV SOLN: INTRAVENOUS | Status: DC

## 2022-02-27 MED ORDER — ALBUTEROL SULFATE (2.5 MG/3ML) 0.083% IN NEBU: 2.5000 mg | INHALATION_SOLUTION | RESPIRATORY_TRACT | Status: DC | PRN

## 2022-02-27 MED ORDER — SODIUM CHLORIDE 0.9 % IV BOLUS: 1000.0000 mL | Freq: Once | INTRAVENOUS | Status: DC

## 2022-02-27 MED ORDER — ASPIRIN 81 MG PO TBEC
81.0000 mg | DELAYED_RELEASE_TABLET | Freq: Every day | ORAL | Status: DC
  Administered 2022-02-28 – 2022-03-02 (×3): 81 mg via ORAL
  Filled 2022-02-27 (×3): qty 1

## 2022-02-27 MED ORDER — METOPROLOL SUCCINATE ER 50 MG PO TB24
50.0000 mg | ORAL_TABLET | Freq: Every day | ORAL | Status: DC
  Administered 2022-03-01 – 2022-03-02 (×2): 50 mg via ORAL
  Filled 2022-02-27 (×2): qty 1

## 2022-02-27 NOTE — Progress Notes (Signed)
       CROSS COVER NOTE  NAME: Micheal [name] MRN: [id number] DOB : Apr 25, 1933 ATTENDING PHYSICIAN: Ivor Costa, MD    Notified by nursing they received ENDOTOOL alert that patient is to be transitioned off of insulin infusion.  Chart reviewed.  Gap is closed and beta-hydroxybutyrate level normalizing.    Patient not on scheduled basal insulin at home - will place on approx 0.3 units per kg of basal insulin therapy.   16U of basal insulin daily and 3 units before each meal ordered.  Insulin infusion to be stopped 2 hrs after administration of basal insulin administration.     Initiating accuchecks QAC and QHS with sliding scale insulin.  Pyridium ordered for patient report of dysuria. Urinalysis collected today and urine culture pending.  This document was prepared using Dragon voice recognition software and may include unintentional dictation errors.  Neomia Glass DNP, MBA, FNP-BC Nurse Practitioner [facility] Pager [phone]    This document was prepared using Dragon voice recognition software and may include unintentional dictation errors.  Neomia Glass DNP, MBA, FNP-BC Nurse Practitioner [facility] Pager [phone]

## 2022-02-27 NOTE — Consult Note (Signed)
Cephas Darby, MD [address]  [place]  [place],  [address]  Main: [phone]  Fax: [phone] Pager: [phone]   Consultation  Referring Provider:     No ref. provider found Primary Care Physician:  Tennis Must, MD Primary Gastroenterologist: Althia Forts         Reason for Consultation: Chronic diarrhea, unexplained weight loss  Date of Admission:  02/27/2022 Date of Consultation:  02/27/2022         HPI:   Micheal [name] is a 88 y.o. male history of diabetes, hypertension, COPD, coronary artery disease, small bowel obstruction in 03/2019 s/p ex lap and lysis of adhesions is admitted with chronic diarrhea, loss of appetite and unexplained weight loss within the last 6 months.  Patient is found to have hyperosmolar hyper ketotic state, his blood glucose levels were 630 with hyperkalemia, hyponatremia, AKI.  CBC revealed normocytic anemia, thrombocytopenia, normal serum lactic acid.  Patient is admitted for insulin drip.  When I interviewed the patient, he reports that he has been experiencing several bouts of nonbloody diarrhea for last 6 months associated with abdominal bloating and he lost about 30 to 40 pounds within a span of 6 months.  He lives independently with his wife at home.  Patient denies any rectal bleeding.  He denies any abdominal pain, sometimes has low back pain.  He has been a lifelong smoker.  His diabetes is not well controlled   NSAIDs: None  Antiplts/Anticoagulants/Anti thrombotics: None  GI Procedures: Reports undergoing colonoscopy several years ago  Past Medical History:  Diagnosis Date   Balance problem    INTERMITTENT WITH NEGATIVE WORKUP   COPD (chronic obstructive pulmonary disease) ([place])    Coronary artery disease    Diabetes mellitus without complication (H[facility])    GERD (gastroesophageal reflux disease)    Hypercholesteremia    Hypertension    Myocardial infarction ([place])    2005   Pneumonia    RECENT 5/17   Shortness  of breath dyspnea    Wheezing     Past Surgical History:  Procedure Laterality Date   APPENDECTOMY     CARDIAC CATHETERIZATION     CATARACT EXTRACTION W/PHACO Right 10/04/2015   Procedure: CATARACT EXTRACTION PHACO AND INTRAOCULAR LENS PLACEMENT ([place]);  Surgeon: [name], MD;  Location: [facility];  Service: Ophthalmology;  Laterality: Right;  [place] 02:06 AP% 17.9 CDE 22.65 fluid pack lot # [id number]   CATARACT EXTRACTION W/PHACO Left 12/13/2015   Procedure: CATARACT EXTRACTION PHACO AND INTRAOCULAR LENS PLACEMENT ([facility]);  Surgeon: [name], MD;  Location: [facility];  Service: Ophthalmology;  Laterality: Left;  [place] 1.39 AP% 25.3 CDE 26.37 Fluid pack lot # [id number]   COLONOSCOPY     CORONARY ANGIOPLASTY     Stent placement   LAPAROTOMY N/A 03/30/2019   Procedure: EXPLORATORY LAPAROTOMY, POSSIBLE BOWEL RESECTION,;  Surgeon: Olean Ree, MD;  Location: [facility];  Service: General;  Laterality: N/A;   ORIF ANKLE FRACTURE Left 01/10/2018   Procedure: OPEN REDUCTION INTERNAL FIXATION (ORIF) ANKLE FRACTURE;  Surgeon: Dereck Leep, MD;  Location: [facility];  Service: Orthopedics;  Laterality: Left;     Current Facility-Administered Medications:    0.9 %  sodium chloride infusion, , Intravenous, Continuous, Ivor Costa, MD, Last Rate: 125 mL/hr at 02/27/22 1747, Infusion Verify at 02/27/22 1747   acetaminophen (TYLENOL) tablet 650 mg, 650 mg, Oral, Q6H PRN, Ivor Costa, MD   albuterol (PROVENTIL) (2.5 MG/3ML) 0.083% nebulizer solution 2.5 mg, 2.5  mg, Inhalation, Q4H PRN, Ivor Costa, MD   [START ON 02/28/2022] aspirin EC tablet 81 mg, 81 mg, Oral, Daily, Ivor Costa, MD   cefTRIAXone (ROCEPHIN) 1 g in sodium chloride 0.9 % 100 mL IVPB, 1 g, Intravenous, Q24H, Ivor Costa, MD, Stopped at 02/27/22 1548   dextromethorphan-guaiFENesin ([place] DM) 30-600 MG per 12 hr tablet 1 tablet, 1 tablet, Oral, BID PRN, Ivor Costa, MD   dextrose 5 % in lactated ringers infusion, , Intravenous,  Continuous, Ivor Costa, MD   dextrose 50 % solution 0-50 mL, 0-50 mL, Intravenous, PRN, Ivor Costa, MD   feeding supplement (ENSURE ENLIVE / ENSURE PLUS) liquid 237 mL, 237 mL, Oral, BID BM, Ivor Costa, MD   hydrALAZINE (APRESOLINE) injection 5 mg, 5 mg, Intravenous, Q2H PRN, Ivor Costa, MD   insulin regular, human (MYXREDLIN) 100 units/ 100 mL infusion, , Intravenous, Continuous, Ivor Costa, MD, Last Rate: 7 mL/hr at 02/27/22 1747, 7 Units/hr at 02/27/22 1747   ipratropium-albuterol (DUONEB) 0.5-2.5 (3) MG/3ML nebulizer solution 3 mL, 3 mL, Nebulization, Q4H, Ivor Costa, MD, 3 mL at 02/27/22 1557   losartan (COZAAR) tablet 50 mg, 50 mg, Oral, Daily, Ivor Costa, MD   Derrill Memo ON 02/28/2022] metoprolol succinate (TOPROL-XL) 24 hr tablet 50 mg, 50 mg, Oral, Daily, Ivor Costa, MD   nicotine (NICODERM CQ - dosed in mg/24 hours) patch 21 mg, 21 mg, Transdermal, Daily, Ivor Costa, MD   ondansetron [facility]) injection 4 mg, 4 mg, Intravenous, Q8H PRN, Ivor Costa, MD   [START ON 02/28/2022] predniSONE (DELTASONE) tablet 40 mg, 40 mg, Oral, Q breakfast, Niu, Soledad Gerlach, MD   sodium chloride 0.9 % bolus 1,000 mL, 1,000 mL, Intravenous, Once, Ivor Costa, MD   History reviewed. No pertinent family history.   Social History   Tobacco Use   Smoking status: Former   Smokeless tobacco: Former  Substance Use Topics   Alcohol use: No    Comment: OCCAS   Drug use: Never    Allergies as of 02/27/2022   (No Known Allergies)    Review of Systems:    All systems reviewed and negative except where noted in HPI.   Physical Exam:  Vital signs in last 24 hours: Temp:  [97.6 F (36.4 C)-98 F (36.7 C)] 98 F (36.7 C) (10/10 1654) Pulse Rate:  [29-86] 45 (10/10 1700) Resp:  [11-21] 11 (10/10 1700) BP: (104-128)/(48-68) 117/48 (10/10 1700) SpO2:  [91 %-99 %] 91 % (10/10 1700) Weight:  [53.1 kg] 53.1 kg (10/10 1040) Last BM Date : 02/27/22 General:   Pleasant, cooperative in NAD, thin built, poorly  nourished Head:  Normocephalic and atraumatic, bitemporal wasting. Eyes:   No icterus.   Conjunctiva pink. PERRLA. Ears:  Normal auditory acuity. Neck:  Supple; no masses or thyroidomegaly Lungs: Respirations even and unlabored. Lungs clear to auscultation bilaterally.   No wheezes, crackles, or rhonchi.  Heart:  Regular rate and rhythm;  Without murmur, clicks, rubs or gallops Abdomen:  Soft, moderately distended, tympanic to percussion, nontender. Normal bowel sounds. No appreciable masses or hepatomegaly.  No rebound or guarding.  Rectal:  Not performed. Msk:  Symmetrical without gross deformities.  Strength generalized weakness Extremities:  Without edema, cyanosis or clubbing. Neurologic:  Alert and oriented x3;  grossly normal neurologically. Skin:  Intact without significant lesions or rashes. Psych:  Alert and cooperative. Normal affect.  LAB RESULTS:    Latest Ref Rng & Units 02/27/2022   12:40 PM 04/15/2019    4:33 AM 04/14/2019  5:39 AM  CBC  WBC 4.0 - 10.5 K/uL 7.6  12.7  13.2   Hemoglobin 13.0 - 17.0 g/dL 10.6  8.9  8.4   Hematocrit 39.0 - 52.0 % 33.4  28.4  25.7   Platelets 150 - 400 K/uL 97  301  262     BMET    Latest Ref Rng & Units 02/27/2022    2:12 PM 02/27/2022   10:43 AM 04/15/2019    4:33 AM  BMP  Glucose 70 - 99 mg/dL 547  630  287   BUN 8 - 23 mg/dL 42  46  42   Creatinine 0.61 - 1.24 mg/dL 1.76  1.68  1.24   Sodium 135 - 145 mmol/L 128  125  134   Potassium 3.5 - 5.1 mmol/L 6.4  5.9  5.2   Chloride 98 - 111 mmol/L 96  95  98   CO2 22 - 32 mmol/L _0 Calcium 8.9 - 10.3 mg/dL 8.7  8.4  8.3     LFT    Latest Ref Rng & Units 02/27/2022   10:43 AM 04/13/2019    4:32 AM 04/09/2019    5:24 AM  Hepatic Function  Total Protein 6.5 - 8.1 g/dL 6.6  5.9    Albumin 3.5 - 5.0 g/dL 3.1  2.2  2.1   AST 15 - 41 U/L 9  25    ALT 0 - 44 U/L 9  25    Alk Phosphatase 38 - 126 U/L 75  72    Total Bilirubin 0.3 - 1.2 mg/dL 0.8  0.4        STUDIES: DG Chest 2 View  Result Date: 02/27/2022 CLINICAL DATA:  Weight loss, hyponatremia EXAM: CHEST - 2 VIEW COMPARISON:  04/15/2019 FINDINGS: Normal cardiac and mediastinal contours. Aortic atherosclerosis. No focal pulmonary opacity. Trace pleural effusions versus pleural scarring. No pneumothorax. Remote right lower rib fractures. No acute osseous abnormality. IMPRESSION: Trace pleural effusions versus pleural scarring. Electronically Signed   By: Merilyn Baba M.D.   On: 02/27/2022 12:21      Impression / Plan:   Micheal [name] is a 88 y.o. male with history of chronic tobacco use, alcohol use, COPD, poorly controlled diabetes, small bowel obstruction status post ex lap and lysis of adhesions in 03/2019, coronary artery disease, hypertension is admitted with HHS, protein calorie malnutrition, unexplained weight loss, chronic diarrhea  Unexplained weight loss with chronic diarrhea and protein calorie malnutrition Recommend stool studies to rule out infection including C. difficile Given history of chronic tobacco use, need to rule out exocrine pancreatic insufficiency which could also explain his poorly controlled diabetes Recommend CT chest, abdomen and pelvis with contrast once AKI improves to evaluate for any underlying malignancy Check pancreatic fecal elastase levels If stool studies are negative for infection, recommend upper endoscopy and colonoscopy with possible TI evaluation and biopsies after resolution of HHS Check serum cortisol levels to evaluate for adrenal insufficiency with chronic diarrhea, hyperkalemia and weight loss Check thyroid profile Okay to start liquid diet from GI standpoint  Thank you for involving me in the care of this patient.  GI will follow along with you    LOS: 0 days   Sherri Sear, MD  02/27/2022, 6:16 PM    Note: This dictation was prepared with Dragon dictation along with smaller phrase technology. Any transcriptional errors that  result from this process are unintentional.

## 2022-02-27 NOTE — ED Triage Notes (Signed)
Sent from PCP for "drastic weight loss, loss of appetite and diarrhea that have been worsening over the past 6 months."

## 2022-02-27 NOTE — Assessment & Plan Note (Signed)
Patient states he drinks very little recently.  No signs of alcohol withdrawal. -Monitored closely for any signs of withdrawal

## 2022-02-27 NOTE — ED Provider Notes (Signed)
[facility] Provider Note    Event Date/Time   First MD Initiated Contact with Patient 02/27/22 1129     (approximate)   History   Weight Loss   HPI  Micheal [name] is a 88 y.o. male with a history of type 2 diabetes COPD emphysema  Patient comes after going to clinic yesterday and seeing a primary care.  He comes for concerns of having a significant weight loss, loose stools, and fatigue for about 6 months.  He had lab testing done and they are very concerned about his lab results  He comes with his son.  Reports he is still up walking living life, went to [place] a month ago and was treated for urinary tract infection when he had burning with urination and that is since improved and no further symptoms.  However he has poor appetite has lost many many pounds over the last 6 months, and feels like he is overall getting more fatigued and tired on a daily basis.  Reports his breathing feels normal, though 1 can hear that he is slightly wheezing when discussing with him.     Physical Exam   Triage Vital Signs: ED Triage Vitals  Enc Vitals Group     BP 02/27/22 1042 124/68     Pulse Rate 02/27/22 1042 86     Resp 02/27/22 1042 16     Temp 02/27/22 1042 97.6 F (36.4 C)     Temp Source 02/27/22 1042 Oral     SpO2 02/27/22 1042 94 %     Weight 02/27/22 1040 117 lb (53.1 kg)     Height 02/27/22 1040 '5\' 6"'$  (1.676 m)     Head Circumference --      Peak Flow --      Pain Score 02/27/22 1040 0     Pain Loc --      Pain Edu? --      Excl. in [place]? --     Most recent vital signs: Vitals:   02/27/22 1042  BP: 124/68  Pulse: 86  Resp: 16  Temp: 97.6 F (36.4 C)  SpO2: 94%     General: Awake, no distress.  Pleasant.  Mucous membranes slightly dry CV:  Good peripheral perfusion.  Normal heart tones and rate Resp:  Normal effort.  No accessory muscle use.  Mild expiratory wheezing.  Patient reports he has emphysema and his breathing right now  feels very normal to him Abd:  No distention.  Soft nontender nondistended Other:     ED Results / Procedures / Treatments   Labs (all labs ordered are listed, but only abnormal results are displayed) Labs Reviewed  COMPREHENSIVE METABOLIC PANEL - Abnormal; Notable for the following components:      Result Value   Sodium 125 (*)    Potassium 5.9 (*)    Chloride 95 (*)    CO2 20 (*)    Glucose, Bld 630 (*)    BUN 46 (*)    Creatinine, Ser 1.68 (*)    Calcium 8.4 (*)    Albumin 3.1 (*)    AST 9 (*)    GFR, Estimated 39 (*)    All other components within normal limits  URINALYSIS, ROUTINE W REFLEX MICROSCOPIC - Abnormal; Notable for the following components:   Color, Urine YELLOW (*)    APPearance TURBID (*)    Glucose, UA >=500 (*)    Protein, ur 30 (*)    Leukocytes,Ua LARGE (*)  WBC, UA >50 (*)    All other components within normal limits  CBC WITH DIFFERENTIAL/PLATELET - Abnormal; Notable for the following components:   RBC 3.57 (*)    Hemoglobin 10.6 (*)    HCT 33.4 (*)    Platelets 97 (*)    All other components within normal limits  BETA-HYDROXYBUTYRIC ACID - Abnormal; Notable for the following components:   Beta-Hydroxybutyric Acid 0.35 (*)    All other components within normal limits  OSMOLALITY - Abnormal; Notable for the following components:   Osmolality 316 (*)    All other components within normal limits  BRAIN NATRIURETIC PEPTIDE - Abnormal; Notable for the following components:   B Natriuretic Peptide 137.5 (*)    All other components within normal limits  CBG MONITORING, ED - Abnormal; Notable for the following components:   Glucose-Capillary 518 (*)    All other components within normal limits  CBG MONITORING, ED - Abnormal; Notable for the following components:   Glucose-Capillary 471 (*)    All other components within normal limits  URINE CULTURE  CULTURE, BLOOD (ROUTINE X 2)  CULTURE, BLOOD (ROUTINE X 2)  LACTIC ACID, PLASMA  TECHNOLOGIST  SMEAR REVIEW  CBC WITH DIFFERENTIAL/PLATELET  BASIC METABOLIC PANEL  BASIC METABOLIC PANEL  BASIC METABOLIC PANEL  BASIC METABOLIC PANEL  HEMOGLOBIN A1C  OSMOLALITY     EKG  And interpreted by me at 1250 heart rate 80 QRS 70 QTc 440 Normal sinus rhythm, occasional PVC.  Mild repolarization abnormality.  Right axis deviation.  No evidence of acute cardiac ischemia or obvious effect of hyperkalemia   RADIOLOGY  Chest x-ray interpreted by me as emphysematous changes.  Radiologist reports possible scarring versus small bilateral pleural effusions  DG Chest 2 View  Result Date: 02/27/2022 CLINICAL DATA:  Weight loss, hyponatremia EXAM: CHEST - 2 VIEW COMPARISON:  04/15/2019 FINDINGS: Normal cardiac and mediastinal contours. Aortic atherosclerosis. No focal pulmonary opacity. Trace pleural effusions versus pleural scarring. No pneumothorax. Remote right lower rib fractures. No acute osseous abnormality. IMPRESSION: Trace pleural effusions versus pleural scarring. Electronically Signed   By: Merilyn Baba M.D.   On: 02/27/2022 12:21        PROCEDURES:  Critical Care performed: No  Hyperkalemia treated with IV glucagon and also albuterol and IV fluids.  Suspect related to prerenal/dehydration and anticipate potassium will begin to correct as treatment of hyperglycemia as well as rehydration occur.  Procedures   MEDICATIONS ORDERED IN ED: Medications  ondansetron (ZOFRAN) injection 4 mg (has no administration in time range)  acetaminophen (TYLENOL) tablet 650 mg (has no administration in time range)  hydrALAZINE (APRESOLINE) injection 5 mg (has no administration in time range)  cefTRIAXone (ROCEPHIN) 1 g in sodium chloride 0.9 % 100 mL IVPB (1 g Intravenous New Bag/Given 02/27/22 1505)  insulin regular, human (MYXREDLIN) 100 units/ 100 mL infusion (8 Units/hr Intravenous New Bag/Given 02/27/22 1517)  dextrose 5 % in lactated ringers infusion (has no administration in time range)   dextrose 50 % solution 0-50 mL (has no administration in time range)  0.9 %  sodium chloride infusion ( Intravenous New Bag/Given 02/27/22 1517)  sodium chloride 0.9 % bolus 500 mL (0 mLs Intravenous Stopped 02/27/22 1406)  calcium gluconate 1 g/ 50 mL sodium chloride IVPB (0 mg Intravenous Stopped 02/27/22 1406)  ipratropium-albuterol (DUONEB) 0.5-2.5 (3) MG/3ML nebulizer solution 3 mL (3 mLs Nebulization Given 02/27/22 1257)  lactated ringers bolus 1,062 mL (1,062 mLs Intravenous New Bag/Given 02/27/22 1506)  IMPRESSION / MDM / ASSESSMENT AND PLAN / ED COURSE  I reviewed the triage vital signs and the nursing notes.                              Differential diagnosis includes, but is not limited to, possible underlying malignancy, chronic infection, uncontrolled diabetes, complications from diabetes, renal disease, etc.  Differential diagnosis quite broad but what is apparent today is the patient will require admission he has notable electrolyte abnormalities including hyponatremia with some element of pseudohyponatremia, hyperkalemia reduced CO2 creatinine of 1.7 with evidence of AKI with a creatinine greater than 2 at lab draw yesterday.  Urinalysis with white cells present and leukocytes but no bacteria seen.  He reports no ongoing urinary symptoms or dysuria and was treated for UTI a month ago.  We will send for culture  Chest x-ray reviewed negative for infiltrate.  No obvious frank infectious symptoms except he reports loose stools for over a 6 months time    Patient's presentation is most consistent with acute complicated illness / injury requiring diagnostic workup.  The patient is on the cardiac monitor to evaluate for evidence of arrhythmia and/or significant heart rate changes.  Clinical Course as of 02/27/22 1519  Tue Feb 27, 2022  1255 Sodium corrects to 133 when accounting for hyperglycemia [MQ]    Clinical Course User Index [MQ] Delman Kitten, MD   Plan to  initiate hydration along with IV calcium gluconate.  Chest x-ray no obvious mass lesion.  Anticipate admission for further work-up as to cause.  Peripheral smear ordered to assist in evaluation for possible underlying malignancy as well.  Patient does have evidence of AKI, likely prerenal, hyponatremia hyperglycemia uncontrolled diabetes to this point but no strong indication of DKA though pending testing  ----------------------------------------- 3:18 PM on 02/27/2022 ----------------------------------------- Consulted with and patient excepted and admission by Dr. Blaine Hamper.  Dr. Blaine Hamper advises he will arrange for glycemic treatments, and further work-up under the hospitalist service.  Patient and patient's son both understanding very agreeable with plan for admission  FINAL CLINICAL IMPRESSION(S) / ED DIAGNOSES   Final diagnoses:  Hyperglycemia  AKI (acute kidney injury) ([place])     Rx / DC Orders   ED Discharge Orders     None        Note:  This document was prepared using Dragon voice recognition software and may include unintentional dictation errors.   Delman Kitten, MD 02/27/22 [phone]

## 2022-02-27 NOTE — Assessment & Plan Note (Addendum)
Patient reported loose stool and incontinence for the past 6 months.  No nausea, vomiting, abdominal pain.  Etiology is not clear.  Patient has a significant weight loss.  -Check C. 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

## 2022-02-27 NOTE — Assessment & Plan Note (Addendum)
Ruled out exacerbation --prednisone started on admission, d/c'ed

## 2022-02-27 NOTE — Assessment & Plan Note (Signed)
Lipitor 

## 2022-02-27 NOTE — H&P (Signed)
History and Physical    Micheal [name] OQH:[id number] DOB: [birth year] DOA: 02/27/2022  Referring MD/NP/PA:   PCP: Tennis Must, MD   Patient coming from:  The patient is coming from home.  At baseline, pt is independent for most of ADL.        Chief Complaint: Diarrhea, weight loss, increased urinary frequency  HPI: Micheal [name] is a 88 y.o. male with medical history significant of hypertension, hyperlipidemia, diabetes mellitus, COPD, GERD, CAD, tobacco abuse, alcohol abuse (has cut down on drinking recently), CKD 3B, small bowel obstruction, who presents with diarrhea, weight loss, increased urinary frequency.  Tenderness at the bedside, patient has been having diarrhea for more than 6 months, several loose stool every day, sometimes with fecal incontinence.  Denies nausea vomiting, abdominal pain.  No fever or chills.  Patient has lasted more than 10 pounds recently. Patient has mild dry cough, mild wheezing, denies chest pain or shortness breath.  Patient has increased urinary frequency, denies dysuria or burning with urination.  Data reviewed independently and ED Course: pt was found to have HHS (blood sugar 630 with normal anion gap 10), WBC 7.6, positive urinalysis for UTI (turbid appearance, large amount of leukocyte, negative bacteria, WBC> 50), beta hydroxybutyric acid 0.35, temperature normal, blood pressure 124/68, heart rate 86, RR 16, oxygen saturation 94% on room air.  Chest x-ray showed trace pleural effusion.  Patient is admitted to stepdown as inpatient.  Dr. Marius Ditch of GI is consulted  EKG: I have personally reviewed.  Sinus rhythm, QTc 441, PVC, low voltage, possible right axis deviation, possible incomplete right bundle blockade.  Review of Systems:   General: no fevers, chills,  has weight loss, has poor appetite, has fatigue HEENT: no blurry vision, hearing changes or sore throat Respiratory: no dyspnea, dry coughing, has mild wheezing CV: no chest pain, no  palpitations GI: no nausea, vomiting, abdominal pain, has diarrhea, no constipation GU: no dysuria, burning on urination, increased urinary frequency, hematuria  Ext: no leg edema Neuro: no unilateral weakness, numbness, or tingling, no vision change or hearing loss Skin: no rash, no skin tear. MSK: No muscle spasm, no deformity, no limitation of range of movement in spin Heme: No easy bruising.  Travel history: No recent long distant travel.   Allergy: No Known Allergies  Past Medical History:  Diagnosis Date   Balance problem    INTERMITTENT WITH NEGATIVE WORKUP   COPD (chronic obstructive pulmonary disease) (HCC)    Coronary artery disease    Diabetes mellitus without complication (HCC)    GERD (gastroesophageal reflux disease)    Hypercholesteremia    Hypertension    Myocardial infarction [facility])    2005   Pneumonia    RECENT 5/17   Shortness of breath dyspnea    Wheezing     Past Surgical History:  Procedure Laterality Date   APPENDECTOMY     CARDIAC CATHETERIZATION     CATARACT EXTRACTION W/PHACO Right 10/04/2015   Procedure: CATARACT EXTRACTION PHACO AND INTRAOCULAR LENS PLACEMENT ([place]);  Surgeon: [name], MD;  Location: [facility] ORS;  Service: Ophthalmology;  Laterality: Right;  [place] 02:06 AP% 17.9 CDE 22.65 fluid pack lot # [id number]   CATARACT EXTRACTION W/PHACO Left 12/13/2015   Procedure: CATARACT EXTRACTION PHACO AND INTRAOCULAR LENS PLACEMENT ([facility]);  Surgeon: [name], MD;  Location: [facility] ORS;  Service: Ophthalmology;  Laterality: Left;  [place] 1.39 AP% 25.3 CDE 26.37 Fluid pack lot # [id number]   COLONOSCOPY     CORONARY ANGIOPLASTY  Stent placement   LAPAROTOMY N/A 03/30/2019   Procedure: EXPLORATORY LAPAROTOMY, POSSIBLE BOWEL RESECTION,;  Surgeon: Olean Ree, MD;  Location: [facility] ORS;  Service: General;  Laterality: N/A;   ORIF ANKLE FRACTURE Left 01/10/2018   Procedure: OPEN REDUCTION INTERNAL FIXATION (ORIF) ANKLE FRACTURE;  Surgeon: Dereck Leep, MD;  Location: [facility] ORS;  Service: Orthopedics;  Laterality: Left;    Social History:  reports that he has quit smoking. He has quit using smokeless tobacco. He reports that he does not drink alcohol and does not use drugs.  Family History: History reviewed. No pertinent family history.  I tried to have reviewed with patient about his family medical history, but patient states he does not know detailed information about his family medical history.  Prior to Admission medications   Medication Sig Start Date End Date Taking? Authorizing Provider  albuterol (PROVENTIL HFA;VENTOLIN HFA) 108 (90 Base) MCG/ACT inhaler Inhale 2 puffs into the lungs every 6 (six) hours as needed for wheezing or shortness of breath. 08/21/17   [name], MD  albuterol (PROVENTIL) (2.5 MG/3ML) 0.083% nebulizer solution Inhale 3 mLs into the lungs every 4 (four) hours as needed for wheezing or shortness of breath. 08/26/17   [provider]  aspirin EC 81 MG tablet Take 81 mg by mouth daily. 08/26/17   [provider]  atorvastatin (LIPITOR) 40 MG tablet Take 40 mg by mouth daily.     [provider]  carvedilol (COREG) 6.25 MG tablet Take 6.25 mg by mouth 2 (two) times daily with a meal.     [provider]  Fluticasone-Salmeterol (ADVAIR DISKUS) 100-50 MCG/DOSE AEPB Inhale 1 puff into the lungs 2 (two) times daily. 08/21/17 03/25/19  [name], MD  glipiZIDE (GLUCOTROL XL) 2.5 MG 24 hr tablet Take 2.5 mg by mouth daily with breakfast.    [provider]  ibuprofen (ADVIL) 800 MG tablet Take 1 tablet (800 mg total) by mouth every 8 (eight) hours as needed. 04/15/19   Tylene Fantasia, PA-C  ipratropium-albuterol (DUONEB) 0.5-2.5 (3) MG/3ML SOLN Inhale 3 mLs into the lungs every 6 (six) hours as needed for wheezing. 08/26/17   [provider]  lisinopril (PRINIVIL,ZESTRIL) 10 MG tablet Take 10 mg by mouth daily.    [provider]  lisinopril  (ZESTRIL) 20 MG tablet Take 20 mg by mouth daily. 04/01/19   [provider]  metFORMIN (GLUCOPHAGE-XR) 500 MG 24 hr tablet Take 1,000 mg by mouth every evening.    [provider]  Multiple Vitamin (MULTI-VITAMIN) tablet Take 1 tablet by mouth daily.    [provider]  tamsulosin (FLOMAX) 0.4 MG CAPS capsule Take 0.4 mg by mouth daily. 12/11/18   [provider]  tiotropium (SPIRIVA HANDIHALER) 18 MCG inhalation capsule Place 1 capsule (18 mcg total) into inhaler and inhale daily. 08/21/17 03/25/19  [name], MD    Physical Exam: Vitals:   02/27/22 1330 02/27/22 1521 02/27/22 1533 02/27/22 1600  BP: (!) 104/51 (!) 110/56  (!) 108/51  Pulse: 82 66  69  Resp:  (!) 21  (!) 21  Temp:   97.8 F (36.6 C)   TempSrc:   Oral   SpO2:  99%  94%  Weight:      Height:       General: Not in acute distress.  Thin body habitus HEENT:       Eyes: PERRL, EOMI, no scleral icterus.       ENT: No discharge from  the ears and nose, no pharynx injection, no tonsillar enlargement.        Neck: No JVD, no bruit, no mass felt. Heme: No neck lymph node enlargement. Cardiac: S1/S2, RRR, No murmurs, No gallops or rubs. Respiratory: Has mild wheezing bilaterally GI: Soft, nondistended, nontender, no rebound pain, no organomegaly, BS present. GU: No hematuria Ext: No pitting leg edema bilaterally. 1+DP/PT pulse bilaterally. Musculoskeletal: No joint deformities, No joint redness or warmth, no limitation of ROM in spin. Skin: No rashes.  Neuro: Alert, oriented X3, cranial nerves II-XII grossly intact, moves all extremities normally.  Psych: Patient is not psychotic, no suicidal or hemocidal ideation.  Labs on Admission: I have personally reviewed following labs and imaging studies  CBC: Recent Labs  Lab 02/27/22 1240  WBC 7.6  NEUTROABS 5.0  HGB 10.6*  HCT 33.4*  MCV 93.6  PLT 97*   Basic Metabolic Panel: Recent Labs  Lab 02/27/22 1043 02/27/22 1412   NA 125* 128*  K 5.9* 6.4*  CL 95* 96*  CO2 20* 22  GLUCOSE 630* 547*  BUN 46* 42*  CREATININE 1.68* 1.76*  CALCIUM 8.4* 8.7*   GFR: Estimated Creatinine Clearance: 22.2 mL/min (A) (by C-G formula based on SCr of 1.76 mg/dL (H)). Liver Function Tests: Recent Labs  Lab 02/27/22 1043  AST 9*  ALT 9  ALKPHOS 75  BILITOT 0.8  PROT 6.6  ALBUMIN 3.1*   No results for input(s): "LIPASE", "AMYLASE" in the last 168 hours. No results for input(s): "AMMONIA" in the last 168 hours. Coagulation Profile: No results for input(s): "INR", "PROTIME" in the last 168 hours. Cardiac Enzymes: No results for input(s): "CKTOTAL", "CKMB", "CKMBINDEX", "TROPONINI" in the last 168 hours. BNP (last 3 results) No results for input(s): "PROBNP" in the last 8760 hours. HbA1C: No results for input(s): "HGBA1C" in the last 72 hours. CBG: Recent Labs  Lab 02/27/22 1341 02/27/22 1514 02/27/22 1556 02/27/22 1636  GLUCAP 518* 471* 425* 366*   Lipid Profile: No results for input(s): "CHOL", "HDL", "LDLCALC", "TRIG", "CHOLHDL", "LDLDIRECT" in the last 72 hours. Thyroid Function Tests: No results for input(s): "TSH", "T4TOTAL", "FREET4", "T3FREE", "THYROIDAB" in the last 72 hours. Anemia Panel: No results for input(s): "VITAMINB12", "FOLATE", "FERRITIN", "TIBC", "IRON", "RETICCTPCT" in the last 72 hours. Urine analysis:    Component Value Date/Time   COLORURINE YELLOW (A) 02/27/2022 1044   APPEARANCEUR TURBID (A) 02/27/2022 1044   LABSPEC 1.020 02/27/2022 1044   PHURINE 5.0 02/27/2022 1044   GLUCOSEU >=500 (A) 02/27/2022 1044   HGBUR NEGATIVE 02/27/2022 [place] 02/27/2022 1044   [place] 02/27/2022 1044   PROTEINUR 30 (A) 02/27/2022 1044   NITRITE NEGATIVE 02/27/2022 1044   LEUKOCYTESUR LARGE (A) 02/27/2022 1044   Sepsis Labs: '@LABRCNTIP'$ (procalcitonin:4,lacticidven:4) )No results found for this or any previous visit (from the past 240 hour(s)).   Radiological  Exams on Admission: DG Chest 2 View  Result Date: 02/27/2022 CLINICAL DATA:  Weight loss, hyponatremia EXAM: CHEST - 2 VIEW COMPARISON:  04/15/2019 FINDINGS: Normal cardiac and mediastinal contours. Aortic atherosclerosis. No focal pulmonary opacity. Trace pleural effusions versus pleural scarring. No pneumothorax. Remote right lower rib fractures. No acute osseous abnormality. IMPRESSION: Trace pleural effusions versus pleural scarring. Electronically Signed   By: Merilyn Baba M.D.   On: 02/27/2022 12:21      Assessment/Plan Principal Problem:   Hyperosmolar hyperglycemic state (HHS) ([place]) Active Problems:   Diarrhea   UTI (urinary tract infection)   CAD (coronary artery disease)  Chronic kidney disease, stage 3b (HCC)   COPD (chronic obstructive pulmonary disease) (HCC)   HLD (hyperlipidemia)   Hyperkalemia   Protein-calorie malnutrition, moderate (HCC)   Thrombocytopenia (HCC)   HTN (hypertension)   Alcohol abuse   Tobacco abuse   Assessment and Plan: * Hyperosmolar hyperglycemic state (HHS) (HCC) Blood sugar 630, anion gap normal.  Beta hydroxybutyric acid 0.35, patient may have early stage of DKA.   Admit to stepdown as inpt - IVF:  1.5L of NS bolus - start DKA protocol with BMP q4h - IVF: LR at 125 cc/h, will switch to D5-LR at 125 cc/h when CBG<250 - replete K as needed - Zofran prn nausea  - NPO  - blood culture x 2 - consult to diabetic educator   Diarrhea Patient has a chronic diarrhea for more than 6 months.  No nausea, vomiting, abdominal pain.  Etiology is not clear.  Patient has a significant weight loss. -Check C. difficile, GI pathogen panel, stool culture, ova and parasites -consulted Dr. Marius Ditch for GI  UTI (urinary tract infection) - IV Rocephin -Follow-up urine cultures  CAD (coronary artery disease) No chest pain -Continue aspirin, Lipitor, Coreg  Chronic kidney disease, stage 3b ([place]) Renal function slightly worsened than baseline.  Recent  baseline creatinine 1.2-1.6.  His creatinine is 1.68, BUN 46 -Follow-up with BMP -Hold ibuprofen  COPD (chronic obstructive pulmonary disease) (HCC) Patient has mild wheezing and mild dry cough, denies chest pain or shortness breath, indicating mild COPD exacerbation. -Bronchodilators -As needed Mucinex -Prednisone 40 mg daily  HLD (hyperlipidemia) - Lipitor  Hyperkalemia Potassium 5.9 -Patient was given 1 g calcium gluconate -5 g of Lokelma -Patient is on insulin drip, expecting correction -IV fluid as above  Protein-calorie malnutrition, moderate (HCC) BMI 18.88 and BW 53.1 -Ensure -consult nutrition  Thrombocytopenia (HCC) Platelets 79, mental status normal.  Etiology is not clear.  May be related to his alcohol abuse history -Follow-up with CBC -Check LDH and peripheral smear  HTN (hypertension) - IV hydralazine as needed -Metoprolol, Cozaar  Alcohol abuse Patient states he drinks very little recently.  No signs of alcohol withdrawal. -Monitored closely for any signs of withdrawal  Tobacco abuse - Nicotine patch          DVT ppx: SCD  Code Status: Full code  Family Communication:  Yes, patient's son at bed side.  Disposition Plan:  Anticipate discharge back to previous environment  Consults called:  Dr. Marius Ditch of GI  Admission status and Level of care: Stepdown:  as inpt           Dispo: The patient is from: Home              Anticipated d/c is to: Home              Anticipated d/c date is: 2 days              Patient currently is not medically stable to d/c.    Severity of Illness:  The appropriate patient status for this patient is INPATIENT. Inpatient status is judged to be reasonable and necessary in order to provide the required intensity of service to ensure the patient's safety. The patient's presenting symptoms, physical exam findings, and initial radiographic and laboratory data in the context of their chronic comorbidities is felt  to place them at high risk for further clinical deterioration. Furthermore, it is not anticipated that the patient will be medically stable for discharge from the hospital within 2 midnights  of admission.   * I certify that at the point of admission it is my clinical judgment that the patient will require inpatient hospital care spanning beyond 2 midnights from the point of admission due to high intensity of service, high risk for further deterioration and high frequency of surveillance required.*       Date of Service 02/27/2022    Ivor Costa [facility]   If 7PM-7AM, please contact night-coverage [email or web address] 02/27/2022, 4:44 PM

## 2022-02-27 NOTE — Assessment & Plan Note (Signed)
BMI 18.88 and BW 53.1 -Ensure -consult nutrition

## 2022-02-27 NOTE — Inpatient Diabetes Management (Signed)
Inpatient Diabetes Program Recommendations  AACE/ADA: New Consensus Statement on Inpatient Glycemic Control (2015)  Target Ranges:  Prepandial:   less than 140 mg/dL      Peak postprandial:   less than 180 mg/dL (1-2 hours)      Critically ill patients:  140 - 180 mg/dL   Lab Results  Component Value Date   GLUCAP 244 (H) 04/15/2019   HGBA1C 6.8 (H) 01/09/2018    Review of Glycemic Control  Latest Reference Range & Units 02/27/22 12:40  Beta-Hydroxybutyric Acid 0.05 - 0.27 mmol/L 0.35 (H)  (H): Data is abnormally high   Latest Reference Range & Units 02/27/22 10:43  CO2 22 - 32 mmol/L 20 (L)  Glucose 70 - 99 mg/dL 630 (HH)  Anion gap 5 - 15  10  (HH): Data is critically high (L): Data is abnormally low  Diabetes history: DM2 Outpatient Diabetes medications: Glipizide 2.5 mg QD, Metformin 1000 mg QHS Current orders for Inpatient glycemic control: None  Inpatient Diabetes Program Recommendations:     Novolog 0-15 units Q4H  Spoke with patient and son at bedside.   He states he has been losing weight has had increased thirst and urination.  He has been severely fatigued and needs to void multiple times during the night.  He does not check his blood glucose.  He states his doctor told him to stop his Glipizide 1 week ago which he did.    Was seen at [facility] yesterday and his A1C was 10.6% (average blood glucose of 258 mg/dL).  Son states he eats candy often and  does not limit CHO's.    Explained basic DM patho and long and short term complications of uncontrolled glucose.    Recommend insulin moving forward.  Will continue to follow while inpatient.  Thank you, Micheal Dixon, MSN, [place] Diabetes Coordinator Inpatient Diabetes Program [phone] (team pager from 8a-5p)

## 2022-02-27 NOTE — Assessment & Plan Note (Signed)
No chest pain -Continue aspirin, Lipitor, Coreg

## 2022-02-27 NOTE — Assessment & Plan Note (Signed)
-  Nicotine patch 

## 2022-02-27 NOTE — Assessment & Plan Note (Signed)
-   IV hydralazine as needed -Metoprolol, Cozaar

## 2022-02-27 NOTE — Assessment & Plan Note (Signed)
Platelets 79, mental status normal.  Etiology is not clear.  May be related to his alcohol abuse history -Follow-up with CBC -Check LDH and peripheral smear

## 2022-02-27 NOTE — Assessment & Plan Note (Signed)
Blood sugar 630, bicarb 20, anion gap normal.  Beta hydroxybutyric acid 0.35, osm 316. --received insulin gtt and IVF

## 2022-02-27 NOTE — Assessment & Plan Note (Addendum)
K[place] UTI --cont ceftriaxone, day 3 of 3

## 2022-02-27 NOTE — Assessment & Plan Note (Signed)
Renal function slightly worsened than baseline.  Recent baseline creatinine 1.2-1.6.  His creatinine is 1.68, BUN 46 -Follow-up with BMP -Hold ibuprofen

## 2022-02-27 NOTE — Assessment & Plan Note (Signed)
Potassium 5.9 -Patient was given 1 g calcium gluconate -5 g of Lokelma -Patient is on insulin drip, expecting correction -IV fluid as above

## 2022-02-28 ENCOUNTER — Telehealth (HOSPITAL_COMMUNITY): Payer: Self-pay | Admitting: Pharmacy Technician

## 2022-02-28 ENCOUNTER — Other Ambulatory Visit (HOSPITAL_COMMUNITY): Payer: Self-pay

## 2022-02-28 DIAGNOSIS — K529 Noninfective gastroenteritis and colitis, unspecified: Secondary | ICD-10-CM | POA: Diagnosis not present

## 2022-02-28 DIAGNOSIS — E1100 Type 2 diabetes mellitus with hyperosmolarity without nonketotic hyperglycemic-hyperosmolar coma (NKHHC): Principal | ICD-10-CM

## 2022-02-28 DIAGNOSIS — E43 Unspecified severe protein-calorie malnutrition: Secondary | ICD-10-CM

## 2022-02-28 LAB — GLUCOSE, RANDOM: Glucose, Bld: 370 mg/dL — ABNORMAL HIGH (ref 70–99)

## 2022-02-28 LAB — BASIC METABOLIC PANEL
Anion gap: 5 (ref 5–15)
Anion gap: 6 (ref 5–15)
Anion gap: 8 (ref 5–15)
BUN: 35 mg/dL — ABNORMAL HIGH (ref 8–23)
BUN: 34 mg/dL — ABNORMAL HIGH (ref 8–23)
BUN: 31 mg/dL — ABNORMAL HIGH (ref 8–23)
CO2: 25 mmol/L (ref 22–32)
CO2: 26 mmol/L (ref 22–32)
CO2: 24 mmol/L (ref 22–32)
Calcium: 8.8 mg/dL — ABNORMAL LOW (ref 8.9–10.3)
Calcium: 8.9 mg/dL (ref 8.9–10.3)
Calcium: 8.6 mg/dL — ABNORMAL LOW (ref 8.9–10.3)
Chloride: 107 mmol/L (ref 98–111)
Chloride: 106 mmol/L (ref 98–111)
Chloride: 103 mmol/L (ref 98–111)
Creatinine, Ser: 1.38 mg/dL — ABNORMAL HIGH (ref 0.61–1.24)
Creatinine, Ser: 1.38 mg/dL — ABNORMAL HIGH (ref 0.61–1.24)
Creatinine, Ser: 1.23 mg/dL (ref 0.61–1.24)
GFR, Estimated: 49 mL/min — ABNORMAL LOW (ref >60)
GFR, Estimated: 49 mL/min — ABNORMAL LOW (ref >60)
GFR, Estimated: 57 mL/min — ABNORMAL LOW (ref >60)
Glucose, Bld: 158 mg/dL — ABNORMAL HIGH (ref 70–99)
Glucose, Bld: 146 mg/dL — ABNORMAL HIGH (ref 70–99)
Glucose, Bld: 152 mg/dL — ABNORMAL HIGH (ref 70–99)
Potassium: 4.8 mmol/L (ref 3.5–5.1)
Potassium: 5.6 mmol/L — ABNORMAL HIGH (ref 3.5–5.1)
Potassium: 4.3 mmol/L (ref 3.5–5.1)
Sodium: 137 mmol/L (ref 135–145)
Sodium: 138 mmol/L (ref 135–145)
Sodium: 135 mmol/L (ref 135–145)

## 2022-02-28 LAB — VITAMIN B12: Vitamin B-12: 464 pg/mL (ref 180–914)

## 2022-02-28 LAB — CBC
HCT: 32.2 % — ABNORMAL LOW (ref 39.0–52.0)
Hemoglobin: 10.5 g/dL — ABNORMAL LOW (ref 13.0–17.0)
MCH: 29.7 pg (ref 26.0–34.0)
MCHC: 32.6 g/dL (ref 30.0–36.0)
MCV: 91.2 fL (ref 80.0–100.0)
Platelets: 90 10*3/uL — ABNORMAL LOW (ref 150–400)
RBC: 3.53 MIL/uL — ABNORMAL LOW (ref 4.22–5.81)
RDW: 14.5 % (ref 11.5–15.5)
WBC: 10.5 10*3/uL (ref 4.0–10.5)
nRBC: 0 % (ref 0.0–0.2)

## 2022-02-28 LAB — CORTISOL: Cortisol, Plasma: 7.1 ug/dL

## 2022-02-28 LAB — GLUCOSE, CAPILLARY
Glucose-Capillary: 148 mg/dL — ABNORMAL HIGH (ref 70–99)
Glucose-Capillary: 158 mg/dL — ABNORMAL HIGH (ref 70–99)
Glucose-Capillary: 164 mg/dL — ABNORMAL HIGH (ref 70–99)
Glucose-Capillary: 166 mg/dL — ABNORMAL HIGH (ref 70–99)
Glucose-Capillary: 170 mg/dL — ABNORMAL HIGH (ref 70–99)
Glucose-Capillary: 191 mg/dL — ABNORMAL HIGH (ref 70–99)
Glucose-Capillary: 201 mg/dL — ABNORMAL HIGH (ref 70–99)
Glucose-Capillary: 216 mg/dL — ABNORMAL HIGH (ref 70–99)
Glucose-Capillary: 362 mg/dL — ABNORMAL HIGH (ref 70–99)
Glucose-Capillary: 400 mg/dL — ABNORMAL HIGH (ref 70–99)
Glucose-Capillary: 402 mg/dL — ABNORMAL HIGH (ref 70–99)
Glucose-Capillary: 438 mg/dL — ABNORMAL HIGH (ref 70–99)

## 2022-02-28 LAB — FOLATE: Folate: 10.1 ng/mL (ref >5.9)

## 2022-02-28 LAB — MAGNESIUM: Magnesium: 1.7 mg/dL (ref 1.7–2.4)

## 2022-02-28 LAB — PHOSPHORUS: Phosphorus: 2.4 mg/dL — ABNORMAL LOW (ref 2.5–4.6)

## 2022-02-28 MED ORDER — SODIUM ZIRCONIUM CYCLOSILICATE 5 G PO PACK
10.0000 g | PACK | Freq: Once | ORAL | Status: AC
  Administered 2022-02-28: 10 g via ORAL
  Filled 2022-02-28: qty 2

## 2022-02-28 MED ORDER — ENOXAPARIN SODIUM 40 MG/0.4ML IJ SOSY
40.0000 mg | PREFILLED_SYRINGE | INTRAMUSCULAR | Status: DC
  Administered 2022-02-28 – 2022-03-01 (×2): 40 mg via SUBCUTANEOUS
  Filled 2022-02-28 (×2): qty 0.4

## 2022-02-28 MED ORDER — INSULIN DETEMIR 100 UNIT/ML ~~LOC~~ SOLN
0.3000 [IU]/kg | SUBCUTANEOUS | Status: DC
  Administered 2022-02-28: 16 [IU] via SUBCUTANEOUS
  Filled 2022-02-28 (×2): qty 0.16

## 2022-02-28 MED ORDER — FOLIC ACID 1 MG PO TABS
1.0000 mg | ORAL_TABLET | Freq: Every day | ORAL | Status: DC
  Administered 2022-03-01 – 2022-03-02 (×2): 1 mg via ORAL
  Filled 2022-02-28 (×2): qty 1

## 2022-02-28 MED ORDER — ADULT MULTIVITAMIN W/MINERALS CH
1.0000 | ORAL_TABLET | Freq: Every day | ORAL | Status: DC
  Administered 2022-03-01 – 2022-03-02 (×2): 1 via ORAL
  Filled 2022-02-28 (×2): qty 1

## 2022-02-28 MED ORDER — INSULIN ASPART 100 UNIT/ML IJ SOLN
0.0000 [IU] | Freq: Three times a day (TID) | INTRAMUSCULAR | Status: DC
  Administered 2022-02-28: 1 [IU] via SUBCUTANEOUS
  Administered 2022-02-28: 2 [IU] via SUBCUTANEOUS
  Administered 2022-02-28: 7 [IU] via SUBCUTANEOUS
  Filled 2022-02-28 (×3): qty 1

## 2022-02-28 MED ORDER — THIAMINE HCL 100 MG PO TABS
100.0000 mg | ORAL_TABLET | Freq: Every day | ORAL | Status: DC
  Administered 2022-03-01 – 2022-03-02 (×2): 100 mg via ORAL
  Filled 2022-02-28 (×4): qty 1

## 2022-02-28 MED ORDER — POLYETHYLENE GLYCOL 3350 17 GM/SCOOP PO POWD
1.0000 | Freq: Once | ORAL | Status: AC
  Administered 2022-02-28: 255 g via ORAL
  Filled 2022-02-28: qty 255

## 2022-02-28 MED ORDER — INSULIN ASPART 100 UNIT/ML IJ SOLN
0.0000 [IU] | Freq: Every day | INTRAMUSCULAR | Status: DC
  Administered 2022-03-01: 5 [IU] via SUBCUTANEOUS
  Filled 2022-02-28: qty 1

## 2022-02-28 MED ORDER — INSULIN STARTER KIT- PEN NEEDLES (ENGLISH)
1.0000 | Freq: Once | Status: AC
  Administered 2022-02-28: 1
  Filled 2022-02-28: qty 1

## 2022-02-28 MED ORDER — INSULIN ASPART 100 UNIT/ML IJ SOLN: 0.0000 [IU] | Freq: Three times a day (TID) | INTRAMUSCULAR | Status: DC

## 2022-02-28 MED ORDER — INSULIN ASPART 100 UNIT/ML IJ SOLN
0.0000 [IU] | Freq: Three times a day (TID) | INTRAMUSCULAR | Status: DC
  Administered 2022-02-28: 15 [IU] via SUBCUTANEOUS
  Administered 2022-03-01: 3 [IU] via SUBCUTANEOUS
  Administered 2022-03-02: 8 [IU] via SUBCUTANEOUS
  Administered 2022-03-02: 15 [IU] via SUBCUTANEOUS
  Administered 2022-03-02: 8 [IU] via SUBCUTANEOUS
  Filled 2022-02-28 (×5): qty 1

## 2022-02-28 MED ORDER — LIVING WELL WITH DIABETES BOOK
Freq: Once | Status: AC
  Filled 2022-02-28: qty 1

## 2022-02-28 MED ORDER — SODIUM CHLORIDE 0.9 % IV SOLN: INTRAVENOUS | Status: DC

## 2022-02-28 MED ORDER — INSULIN ASPART 100 UNIT/ML IJ SOLN
3.0000 [IU] | Freq: Three times a day (TID) | INTRAMUSCULAR | Status: DC
  Administered 2022-02-28 – 2022-03-02 (×4): 3 [IU] via SUBCUTANEOUS
  Filled 2022-02-28 (×5): qty 1

## 2022-02-28 MED ORDER — CHLORHEXIDINE GLUCONATE CLOTH 2 % EX PADS
6.0000 | MEDICATED_PAD | Freq: Every day | CUTANEOUS | Status: DC
  Administered 2022-02-28: 6 via TOPICAL

## 2022-02-28 NOTE — TOC Benefit Eligibility Note (Signed)
Patient Teacher, English as a foreign language completed.    The patient is currently admitted and upon discharge could be taking Lantus Pens.  The current 30 day co-pay is $10.35.   The patient is currently admitted and upon discharge could be taking Novolog Pens.  The current 30 day co-pay is $10.35.   The patient is insured through [place] [place] Patient Advocate Specialist [place] Patient Advocate Team Direct Number: [phone]  Fax: [phone]

## 2022-02-28 NOTE — Telephone Encounter (Signed)
Pharmacy Patient Advocate Encounter  Insurance verification completed.    The patient is insured through [employer] Part D   The patient is currently admitted and ran test claims for the following: Novolog, Lantus.  Copays and coinsurance results were relayed to Inpatient clinical team.

## 2022-02-28 NOTE — Progress Notes (Signed)
Initial Nutrition Assessment  DOCUMENTATION CODES:   Severe malnutrition in context of chronic illness  INTERVENTION:   Glucerna Shake po TID with diet advancement, each supplement provides 220 kcal and 10 grams of protein   MVI, thiamine and folic acid po daily   Pt at high refeed risk; recommend monitor potassium, magnesium and phosphorus labs daily until stable  Check vitamins D, A, folate, niacin, zinc, copper, B12 and thiamine.   NUTRITION DIAGNOSIS:   Severe Malnutrition related to chronic illness (COPD, etoh abuse, uncontrolled DM) as evidenced by severe fat depletion, severe muscle depletion, percent weight loss.  GOAL:   Patient will meet greater than or equal to 90% of their needs  MONITOR:   PO intake, Supplement acceptance, Labs, Diet advancement, Weight trends, Skin, I & O's  REASON FOR ASSESSMENT:   Consult Assessment of nutrition requirement/status  ASSESSMENT:   88 y/o male with h/o COPD, DM, GERD, HTN, HLD, etoh and polysubstance abuse, CKD III, SBO s/p LOA 03/2019, CAD, MI s/p stent, PUD, BPH s/p TURBT and s/p partial distal colectomy who is admitted with HHS, UTI and chronic diarrhea.  Met with pt and pt's wife in room today. Pt reports decreased appetite and oral intake for the past 3-6 months. Pt reports early satiety and bloating; pt reports he gets full after a few bites. Pt denies any changes with his taste. Pt with a 30lb(20%) weight loss over the past 8 months; this is severe weight loss. Pt denies diarrhea but reports he has been having "soft" stools. Pt reports that sometimes he may have 3-4 bowel movements a day and sometimes he may not have a bowel movement for a day. Pt reports that he has been drinking chocolate, diabetic supplement drinks at home but he does not drink these regularly. Pt with h/o partial colectomy and SBO with no small bowel resection. Pt reports h/o gastric ulcers. Pt with h/o diarrhea with metformin. Pt did start Trijardy around  May of this year. Pt currently on clear liquid diet. Pt is asking for regular food today. RD will add supplements with diet advancement. Pt is at high refeed risk. Will check vitamin labs where deficiency can lead to diarrhea. GI is following. C-diff and GI panel negative.   Medications reviewed and include: aspirin, insulin, nicotine, ceftriaxone  Labs reviewed: K 4.3 wnl, BUN 31(H) Hgb 10.5(L), Hct 32.2(L)  NUTRITION - FOCUSED PHYSICAL EXAM:  Flowsheet Row Most Recent Value  Orbital Region Moderate depletion  Upper Arm Region Severe depletion  Thoracic and Lumbar Region Severe depletion  Buccal Region Severe depletion  Temple Region Moderate depletion  Clavicle Bone Region Severe depletion  Clavicle and Acromion Bone Region Severe depletion  Scapular Bone Region Severe depletion  Dorsal Hand Severe depletion  Patellar Region Severe depletion  Anterior Thigh Region Severe depletion  Posterior Calf Region Severe depletion  Edema (RD Assessment) None  Hair Reviewed  Eyes Reviewed  Mouth Reviewed  Skin Reviewed  Nails Reviewed   Diet Order:   Diet Order             Diet clear liquid Room service appropriate? Yes; Fluid consistency: Thin  Diet effective now                  EDUCATION NEEDS:   Education needs have been addressed  Skin:  Skin Assessment: Reviewed RN Assessment  Last BM:  10/11- type 5  Height:   Ht Readings from Last 1 Encounters:  02/27/22 5' 6"  (1.676 m)  Weight:   Wt Readings from Last 1 Encounters:  02/27/22 53.1 kg    Ideal Body Weight:  64.5 kg  BMI:  Body mass index is 18.88 kg/m.  Estimated Nutritional Needs:   Kcal:  1600-1800kcal/day  Protein:  80-90g/day  Fluid:  1.4-1.6L/day  Koleen Distance MS, RD, LDN Please refer to [facility] for RD and/or RD on-call/weekend/after hours pager

## 2022-02-28 NOTE — Evaluation (Signed)
Physical Therapy Evaluation Patient Details Name: Micheal [name] MRN: [id number] DOB: 04/14/1933 Today's Date: 02/28/2022  History of Present Illness  Pt is an 88 year old male presenting with diarrhea, weight loss, increased urinary frequency, admitted with hyperosmolar hyperglycemic state, hyperkalemia, and UTI.  PMH significant for hypertension, hyperlipidemia, diabetes mellitus, COPD, GERD, CAD, tobacco abuse, alcohol abuse (has cut down on drinking recently), CKD 3B, small bowel obstruction.   Clinical Impression  Pt was pleasant and motivated to participate during the session and put forth good effort throughout. Pt required no physical assistance with functional tasks but did present with mild instability during gait, especially upon initiating steps.  Pt's stability grossly improved as gait progressed.  Pt reported no adverse symptoms during the session with SpO2 and HR WNL on room air.  Pt will benefit from HHPT upon discharge to safely address deficits listed in patient problem list for decreased caregiver assistance and eventual return to PLOF.         Recommendations for follow up therapy are one component of a multi-disciplinary discharge planning process, led by the attending physician.  Recommendations may be updated based on patient status, additional functional criteria and insurance authorization.  Follow Up Recommendations Home health PT      Assistance Recommended at Discharge Intermittent Supervision/Assistance  Patient can return home with the following  A little help with walking and/or transfers;Assist for transportation;Help with stairs or ramp for entrance    Equipment Recommendations None recommended by PT  Recommendations for Other Services       Functional Status Assessment Patient has had a recent decline in their functional status and demonstrates the ability to make significant improvements in function in a reasonable and predictable amount of time.      Precautions / Restrictions Precautions Precautions: Fall Restrictions Weight Bearing Restrictions: No      Mobility  Bed Mobility Overal bed mobility: Modified Independent             General bed mobility comments: Min extra time and effort only    Transfers Overall transfer level: Needs assistance Equipment used: Straight cane Transfers: Sit to/from Stand Sit to Stand: Supervision                Ambulation/Gait Ambulation/Gait assistance: Min guard, Supervision Gait Distance (Feet): 150 Feet Assistive device: Straight cane Gait Pattern/deviations: Step-through pattern, Decreased step length - right, Decreased step length - left, Drifts right/left Gait velocity: decreased     General Gait Details: Min to mod drifting upon initiating gait but pt able to self-correct without physical assist.  Stability grossly improved as session progressed  Stairs            Wheelchair Mobility    Modified Rankin (Stroke Patients Only)       Balance Overall balance assessment: Needs assistance Sitting-balance support: Feet supported Sitting balance-Leahy Scale: Good     Standing balance support: Single extremity supported, During functional activity, Reliant on assistive device for balance Standing balance-Leahy Scale: Fair                               Pertinent Vitals/Pain Pain Assessment Pain Assessment: No/denies pain    Home Living Family/patient expects to be discharged to:: Private residence Living Arrangements: Spouse/significant other Available Help at Discharge: Family;Available PRN/intermittently Type of Home: House Home Access: Level entry       Home Layout: One level Home Equipment: Conservation officer, nature (2 wheels);Shower  seat;Rollator (4 wheels);Cane - single point      Prior Function Prior Level of Function : Independent/Modified Independent             Mobility Comments: Mod Ind amb with SPC community distances, no falls  in the last six months ADLs Comments: drives, mod I in ADL, uses shower chair, does not cook- goes out to eat     Hand Dominance   Dominant Hand: Right    Extremity/Trunk Assessment   Upper Extremity Assessment Upper Extremity Assessment: Generalized weakness    Lower Extremity Assessment Lower Extremity Assessment: Generalized weakness    Cervical / Trunk Assessment Cervical / Trunk Assessment: Normal  Communication   Communication: No difficulties  Cognition Arousal/Alertness: Awake/alert Behavior During Therapy: WFL for tasks assessed/performed Overall Cognitive Status: Within Functional Limits for tasks assessed                                          General Comments General comments (skin integrity, edema, etc.): Bp 105/62 (MAP 60) HR 88  in supine, 114/63 (Map 78) HR 84 seated on edge of bed, no report of dizziness throughout. Spo2 >95% on RA thoughout.    Exercises Total Joint Exercises Ankle Circles/Pumps: AROM, Strengthening, Both, 10 reps Quad Sets: Strengthening, Both, 10 reps Gluteal Sets: Strengthening, Both, 10 reps Hip ABduction/ADduction: Strengthening, Both, 5 reps Straight Leg Raises: Strengthening, Both, 5 reps Long Arc Quad: Strengthening, Both, 10 reps Other Exercises Other Exercises: HEP education for BLE APs, QS, LAQs, and GS   Assessment/Plan    PT Assessment Patient needs continued PT services  PT Problem List Decreased strength;Decreased activity tolerance;Decreased balance;Decreased mobility;Decreased knowledge of use of DME       PT Treatment Interventions DME instruction;Gait training;Functional mobility training;Therapeutic activities;Therapeutic exercise;Balance training;Patient/family education    PT Goals (Current goals can be found in the Care Plan section)  Acute Rehab PT Goals Patient Stated Goal: To return home PT Goal Formulation: With patient Time For Goal Achievement: 03/13/22 Potential to Achieve  Goals: Good    Frequency Min 2X/week     Co-evaluation               AM-PAC PT "6 Clicks" Mobility  Outcome Measure Help needed turning from your back to your side while in a flat bed without using bedrails?: None Help needed moving from lying on your back to sitting on the side of a flat bed without using bedrails?: None Help needed moving to and from a bed to a chair (including a wheelchair)?: A Little Help needed standing up from a chair using your arms (e.g., wheelchair or bedside chair)?: A Little Help needed to walk in hospital room?: A Little Help needed climbing 3-5 steps with a railing? : A Little 6 Click Score: 20    End of Session Equipment Utilized During Treatment: Gait belt Activity Tolerance: Patient tolerated treatment well Patient left: in bed;with call bell/phone within reach Nurse Communication: Mobility status PT Visit Diagnosis: Unsteadiness on feet (R26.81);Muscle weakness (generalized) (M62.81)    Time: 2563-8937 PT Time Calculation (min) (ACUTE ONLY): 24 min   Charges:   PT Evaluation $PT Eval Moderate Complexity: 1 Mod PT Treatments $Therapeutic Exercise: 8-22 mins        D. Royetta Asal PT, DPT 02/28/22, 3:28 PM

## 2022-02-28 NOTE — Progress Notes (Signed)
  PROGRESS NOTE    Micheal [name]  ZOX:[id number] DOB: 01-30-1933 DOA: 02/27/2022 PCP: Tennis Must, MD  129A/129A-AA  LOS: 1 day   Brief hospital course:   Assessment & Plan: Micheal [name] is a 88 y.o. male with medical history significant of hypertension, hyperlipidemia, diabetes mellitus, COPD, GERD, CAD, tobacco abuse, alcohol abuse (has cut down on drinking recently), CKD 3B, small bowel obstruction, who presents with diarrhea, weight loss, increased urinary frequency.   * Hyperosmolar hyperglycemic state (HHS) (HCC) Blood sugar 630, bicarb 20, anion gap normal.  Beta hydroxybutyric acid 0.35, osm 316. --received insulin gtt and IVF   DM (diabetes mellitus), type 2, uncontrolled, with hyperosmolarity (HCC) --A1c 10.8.  Was not on insulin PTA. --cont Levemir 16u daily --increase SSI to mod scale --mealtime 3u TID   Diarrhea Patient reported loose stool and incontinence for the past 6 months.  No nausea, vomiting, abdominal pain.  Etiology is not clear.  Patient has a significant weight loss.  -Check C. difficile, GI pathogen panel neg -consulted Dr. Marius Ditch for GI  UTI (urinary tract infection) - IV Rocephin -Follow-up urine cultures  CAD (coronary artery disease) No chest pain -Continue aspirin, Lipitor, Coreg  Chronic kidney disease, stage 3b ([place]) Renal function slightly worsened than baseline.  Recent baseline creatinine 1.2-1.6.  His creatinine is 1.68, BUN 46 -Follow-up with BMP -Hold ibuprofen  COPD (chronic obstructive pulmonary disease) (HCC) Ruled out exacerbation --prednisone started on admission, d/c today  HLD (hyperlipidemia) - Lipitor  Hyperkalemia -Patient was given 1 g calcium gluconate -5 g of Lokelma Resolved.    Thrombocytopenia (HCC) Platelets 79, mental status normal.  Etiology is not clear.  May be related to his alcohol abuse history  HTN (hypertension) - IV hydralazine as needed -Metoprolol, Cozaar  Alcohol abuse Patient  states he drinks very little recently.  No signs of alcohol withdrawal. -Monitored closely for any signs of withdrawal  Tobacco abuse - Nicotine patch  Protein-calorie malnutrition, severe --supplements per dietitian   DVT prophylaxis: Lovenox SQ Code Status: Full code  Family Communication:  Level of care: Med-Surg Dispo:   The patient is from: home Anticipated d/c is to: home Anticipated d/c date is: 1-2 days   Subjective and Interval History:  Pt reported dysuria improved some.  No dyspnea.     Objective: Vitals:   02/28/22 1312 02/28/22 1400 02/28/22 1500 02/28/22 1602  BP:  (!) 123/57 109/67 (!) 109/56  Pulse:  70 93 87  Resp:  (!) '26 17 16  '$ Temp:    97.9 F (36.6 C)  TempSrc:      SpO2: 100% 100% 97% 92%  Weight:      Height:        Intake/Output Summary (Last 24 hours) at 02/28/2022 1745 Last data filed at 02/28/2022 1300 Gross per 24 hour  Intake 2189.46 ml  Output 1101 ml  Net 1088.46 ml   Filed Weights   02/27/22 1040  Weight: 53.1 kg    Examination:   Constitutional: NAD, AAOx3 HEENT: conjunctivae and lids normal, EOMI CV: No cyanosis.   RESP: normal respiratory effort, no wheezes, on RA Extremities: No effusions, edema in BLE SKIN: warm, dry Neuro: II - XII grossly intact.     Data Reviewed: I have personally reviewed labs and imaging studies  Time spent: 50 minutes  Enzo Bi, MD [facility] If 7PM-7AM, please contact night-coverage 02/28/2022, 5:45 PM

## 2022-02-28 NOTE — Inpatient Diabetes Management (Signed)
Inpatient Diabetes Program Recommendations  AACE/ADA: New Consensus Statement on Inpatient Glycemic Control (2015)  Target Ranges:  Prepandial:   less than 140 mg/dL      Peak postprandial:   less than 180 mg/dL (1-2 hours)      Critically ill patients:  140 - 180 mg/dL   Lab Results  Component Value Date   GLUCAP 216 (H) 02/28/2022   HGBA1C 10.8 (H) 02/27/2022    Review of Glycemic Control  Latest Reference Range & Units 02/28/22 07:38 02/28/22 11:46  Glucose-Capillary 70 - 99 mg/dL 148 (H) 216 (H)  (H): Data is abnormally high  Diabetes history: DM2 Outpatient Diabetes medications: Glipizide 2.5 mg QD, Metformin 1000 mg QD Current orders for Inpatient glycemic control:  Levemir 16 units QD, Novolog 0-9 units TID and 3 units TID with meals   Spoke with patient at bedside.  Educated patient on insulin pen use at home. Reviewed contents of insulin flexpen starter kit. Reviewed all steps of insulin pen including attachment of needle, 2-unit air shot, dialing up dose, giving injection, removing needle, disposal of sharps, storage of unused insulin, disposal of insulin etc. Also reviewed troubleshooting with insulin pen. MD to give patient Rxs for insulin pens and insulin pen needles.  Discussed basal and rapid insulins.    Educated on The Plate Method, CHO's, portion control, CBGs at home fasting and mid afternoon, F/U with PCP every 3 months, bring meter to PCP office, long and short term complications of uncontrolled BG, and importance of exercise.  Discussed basic patho of type 2 DM and hypoglycemia signs, symptoms and treatments.    Asked pharmacy for a benefit check on insulins.  He is interested in the [employer] 2 CGM.  He will need the reader as he does not have a smart phone.  He can ask his PCP about this.  He should follow up with PCP in 1-2 weeks for blood sugar review.  He should call PCP for glucose consistently >200 mg/dL or < 100 mg/dL.    Please use each patient  interaction to provide diabetes education. Please review Living Well with Diabetes booklet with the patient, have patient watch patient education videos on diabetes, and instruct on insulin administration. Please allow patient to be actively engaged with diabetes management by allowing patient to check own glucose and self-administer insulin injections. Diabetes Coordinator will follow up with patient and reinforce diabetes education.  For DC please order:  Glucometer Order # [id number] Insulin pen needles Order # [id number] Lantus Solostar insulin pen Order # [id number] (co-pay $10.35) Novolog Flex pen Order # [id number] (co-pay-$10.35)  Given age,  might be appropriate to discharge on basal only and continue Metformin & Glipizide??     Will continue to follow while inpatient.  Thank you, Reche Dixon, MSN, [facility] Diabetes Coordinator Inpatient Diabetes Program [phone] (team pager from 8a-5p)

## 2022-02-28 NOTE — Evaluation (Addendum)
Occupational Therapy Evaluation Patient Details Name: Micheal [name] MRN: [id number] DOB: 1932/08/03 Today's Date: 02/28/2022   History of Present Illness Pt is an 88 year old male presenting with diarrhea, weight loss, increased urinary frequency, admitted with Hyperosmolar hyperglycemic state  PMH significant for hypertension, hyperlipidemia, diabetes mellitus, COPD, GERD, CAD, tobacco abuse, alcohol abuse (has cut down on drinking recently), CKD 3B, small bowel obstruction   Clinical Impression   Chart reviewed, pt greeted in room, agreeable to OT evaluation. Step daughter present throughout. Pt is alert and oriented x4, fair-good safety awareness. PTA pt was amb with SPC, driving, pt reports he would go out to eat. While he lives with his wife, he reports they are cohabitating and she is not available to assist with Adl/IADL as needed. Pt presents with deficits in strength, endurance, activity tolerance, balance affecting safe and optimal ADL completion.Bed mobility performed with supervision, STS with supervision, amb in room approx 20' with SPC with CGA. Pt is able to complete grooming tasks standing at sink level with CGA-supervision. Pt reports he would like to discharge home, step daughter reports they will problem solve if pt requires increased assist for ADL/IADL. Pt is left in bedside chair, all needs met. OT will continue to follow acutely.      Recommendations for follow up therapy are one component of a multi-disciplinary discharge planning process, led by the attending physician.  Recommendations may be updated based on patient status, additional functional criteria and insurance authorization.   Follow Up Recommendations  Home health OT    Assistance Recommended at Discharge Intermittent Supervision/Assistance  Patient can return home with the following A little help with walking and/or transfers;A little help with bathing/dressing/bathroom    Functional Status Assessment   Patient has had a recent decline in their functional status and demonstrates the ability to make significant improvements in function in a reasonable and predictable amount of time.  Equipment Recommendations  BSC/3in1    Recommendations for Other Services       Precautions / Restrictions Precautions Precautions: Fall Restrictions Weight Bearing Restrictions: No      Mobility Bed Mobility Overal bed mobility: Needs Assistance Bed Mobility: Supine to Sit                Transfers Overall transfer level: Needs assistance Equipment used: Straight cane Transfers: Sit to/from Stand Sit to Stand: Supervision                  Balance Overall balance assessment: Needs assistance Sitting-balance support: Feet supported Sitting balance-Leahy Scale: Good     Standing balance support: Single extremity supported, During functional activity Standing balance-Leahy Scale: Fair                             ADL either performed or assessed with clinical judgement   ADL Overall ADL's : Needs assistance/impaired Eating/Feeding: Set up;Sitting   Grooming: Wash/dry hands;Standing;Supervision/safety               Lower Body Dressing: Min guard   Toilet Transfer: Min guard;Ambulation Toilet Transfer Details (indicate cue type and reason): with SPC, intermittent vcs for safety         Functional mobility during ADLs: Supervision/safety;Min guard;Cane (approx 20' in room)       Vision Patient Visual Report: No change from baseline       Perception     Praxis      Pertinent Vitals/Pain Pain Assessment  Pain Assessment: Faces Faces Pain Scale: Hurts a little bit Pain Location: pain with attempting to urinate Pain Intervention(s): Monitored during session     Hand Dominance Right   Extremity/Trunk Assessment Upper Extremity Assessment Upper Extremity Assessment: Generalized weakness   Lower Extremity Assessment Lower Extremity Assessment:  Generalized weakness   Cervical / Trunk Assessment Cervical / Trunk Assessment: Normal   Communication Communication Communication: No difficulties   Cognition Arousal/Alertness: Awake/alert Behavior During Therapy: WFL for tasks assessed/performed Overall Cognitive Status: Within Functional Limits for tasks assessed                                 General Comments: fair safety awareness- pt endroses potential difficulty with support at current living situation, pt step daugther reports she does not like to go over there but will figure out how to help     General Comments  Bp 105/62 (MAP 60) HR 88  in supine, 114/63 (Map 78) HR 84 seated on edge of bed, no report of dizziness throughout. Spo2 >95% on RA thoughout.    Exercises Other Exercises Other Exercises: edu re: role of OT, role of rehab, discharge recommendations, home safety, falls prevention, DME use   Shoulder Instructions      Home Living Family/patient expects to be discharged to:: Private residence Living Arrangements: Spouse/significant other Available Help at Discharge: Family;Available PRN/intermittently Type of Home: House Home Access: Level entry     Home Layout: One level     Bathroom Shower/Tub: Teacher, early years/pre: Handicapped height Bathroom Accessibility: Yes   Home Equipment: Conservation officer, nature (2 wheels);Shower seat [facility])          Prior Functioning/Environment Prior Level of Function : Independent/Modified Independent             Mobility Comments: amb with SPC, no falls in the last six months ADLs Comments: drives, mod I in ADL, uses shower chair, does not cook- goes out to eat        OT Problem List: Decreased strength;Decreased activity tolerance;Impaired balance (sitting and/or standing)      OT Treatment/Interventions: Self-care/ADL training;Therapeutic activities;DME and/or AE instruction;Therapeutic exercise;Balance training;Patient/family education     OT Goals(Current goals can be found in the care plan section) Acute Rehab OT Goals Patient Stated Goal: go home OT Goal Formulation: With patient/family Time For Goal Achievement: 03/14/22 Potential to Achieve Goals: Good ADL Goals Pt Will Perform Grooming: with modified independence;standing Pt Will Perform Lower Body Dressing: with modified independence;sit to/from stand Pt Will Transfer to Toilet: with modified independence;ambulating Pt Will Perform Toileting - Clothing Manipulation and hygiene: with modified independence;sit to/from stand  OT Frequency: Min 2X/week    Co-evaluation              AM-PAC OT "6 Clicks" Daily Activity     Outcome Measure Help from another person eating meals?: None Help from another person taking care of personal grooming?: None Help from another person toileting, which includes using toliet, bedpan, or urinal?: A Little Help from another person bathing (including washing, rinsing, drying)?: A Little Help from another person to put on and taking off regular upper body clothing?: None Help from another person to put on and taking off regular lower body clothing?: A Little 6 Click Score: 21   End of Session Equipment Utilized During Treatment: Gait belt;Other (comment) [facility]) Nurse Communication: Mobility status  Activity Tolerance: Patient tolerated treatment well Patient left:  in chair;with call bell/phone within reach;with chair alarm set;with family/visitor present  OT Visit Diagnosis: Unsteadiness on feet (R26.81);Muscle weakness (generalized) (M62.81)                Time: 2429-9806 OT Time Calculation (min): 26 min Charges:  OT General Charges $OT Visit: 1 Visit OT Evaluation $OT Eval Moderate Complexity: 1 Mod  Shanon Payor, OTD OTR/L  02/28/22, 1:43 PM

## 2022-02-28 NOTE — Progress Notes (Signed)
Cephas Darby, MD [address]  [place]  [place], [place] [address]  Main: [phone]  Fax: [phone] Pager: [phone]   Subjective: No acute events overnight, patient is tolerating p.o. diet.  He denies any abdominal pain.  Reports having soft mushy bowel movements   Objective: Vital signs in last 24 hours: Vitals:   02/28/22 1400 02/28/22 1500 02/28/22 1602 02/28/22 2022  BP: (!) 123/57 109/67 (!) 109/56 (!) 131/44  Pulse: 70 93 87 92  Resp: (!) '26 17 16 17  '$ Temp:   97.9 F (36.6 C) 97.8 F (36.6 C)  TempSrc:      SpO2: 100% 97% 92% 98%  Weight:      Height:       Weight change:   Intake/Output Summary (Last 24 hours) at 02/28/2022 2321 Last data filed at 02/28/2022 2029 Gross per 24 hour  Intake 1904.42 ml  Output 1301 ml  Net 603.42 ml    Exam: Heart:: Regular rate and rhythm, S1S2 present, or without murmur or extra heart sounds Lungs: normal and clear to auscultation Abdomen: soft, nontender, normal bowel sounds   Lab Results:    Latest Ref Rng & Units 02/28/2022    1:38 AM 02/27/2022   12:40 PM 04/15/2019    4:33 AM  CBC  WBC 4.0 - 10.5 K/uL 10.5  7.6  12.7   Hemoglobin 13.0 - 17.0 g/dL 10.5  10.6  8.9   Hematocrit 39.0 - 52.0 % 32.2  33.4  28.4   Platelets 150 - 400 K/uL 90  97  301       Latest Ref Rng & Units 02/28/2022    9:58 PM 02/28/2022   10:01 AM 02/28/2022    6:41 AM  CMP  Glucose 70 - 99 mg/dL 370  152  146   BUN 8 - 23 mg/dL  31  34   Creatinine 0.61 - 1.24 mg/dL  1.23  1.38   Sodium 135 - 145 mmol/L  135  138   Potassium 3.5 - 5.1 mmol/L  4.3  5.6   Chloride 98 - 111 mmol/L  103  106   CO2 22 - 32 mmol/L  24  26   Calcium 8.9 - 10.3 mg/dL  8.6  8.9     Micro Results: Recent Results (from the past 240 hour(s))  Urine Culture     Status: Abnormal (Preliminary result)   Collection Time: 02/27/22 10:44 AM   Specimen: Urine, Clean Catch  Result Value Ref Range Status   Specimen Description   Final     URINE, CLEAN CATCH Performed at [facility], [address], [place], [place] [address]    Special Requests   Final    NONE Performed at [facility], [address], [place],  [address]    Culture (A)  Final    >=100,000 COLONIES/mL KLEBSIELLA PNEUMONIAE SUSCEPTIBILITIES TO FOLLOW Performed at [facility], [place] [address], [place],  [address]    Report Status PENDING  Incomplete  Blood culture (routine x 2)     Status: None (Preliminary result)   Collection Time: 02/27/22 12:40 PM   Specimen: BLOOD  Result Value Ref Range Status   Specimen Description BLOOD LEFT AC  Final   Special Requests   Final    BOTTLES DRAWN AEROBIC AND ANAEROBIC Blood Culture adequate volume   Culture   Final    NO GROWTH < 24 HOURS Performed at [facility], [address] [place], [place] [address]    Report Status PENDING  Incomplete  Blood culture (routine x 2)     Status: None (Preliminary result)   Collection Time: 02/27/22  2:58 PM   Specimen: BLOOD  Result Value Ref Range Status   Specimen Description BLOOD BLOOD LEFT FOREARM  Final   Special Requests   Final    BOTTLES DRAWN AEROBIC AND ANAEROBIC Blood Culture results may not be optimal due to an inadequate volume of blood received in culture bottles   Culture   Final    NO GROWTH < 24 HOURS Performed at [name], [address], [place], [place] [address]    Report Status PENDING  Incomplete  C Difficile Quick Screen w PCR reflex     Status: None   Collection Time: 02/27/22  5:05 PM   Specimen: STOOL  Result Value Ref Range Status   C Diff antigen NEGATIVE NEGATIVE Final   C Diff toxin NEGATIVE NEGATIVE Final   C Diff interpretation No C. difficile detected.  Final    Comment: Performed at [facility], [address] [place], [place] [address]  Gastrointestinal Panel by PCR , Stool     Status: None   Collection Time: 02/27/22  5:05 PM   Specimen: Stool   Result Value Ref Range Status   Campylobacter species NOT DETECTED NOT DETECTED Final   Plesimonas shigelloides NOT DETECTED NOT DETECTED Final   Salmonella species NOT DETECTED NOT DETECTED Final   Yersinia enterocolitica NOT DETECTED NOT DETECTED Final   Vibrio species NOT DETECTED NOT DETECTED Final   Vibrio cholerae NOT DETECTED NOT DETECTED Final   Enteroaggregative E coli (EAEC) NOT DETECTED NOT DETECTED Final   Enteropathogenic E coli (EPEC) NOT DETECTED NOT DETECTED Final   Enterotoxigenic E coli (ETEC) NOT DETECTED NOT DETECTED Final   Shiga like toxin producing E coli (STEC) NOT DETECTED NOT DETECTED Final   Shigella/Enteroinvasive E coli (EIEC) NOT DETECTED NOT DETECTED Final   Cryptosporidium NOT DETECTED NOT DETECTED Final   Cyclospora cayetanensis NOT DETECTED NOT DETECTED Final   Entamoeba histolytica NOT DETECTED NOT DETECTED Final   Giardia lamblia NOT DETECTED NOT DETECTED Final   Adenovirus F40/41 NOT DETECTED NOT DETECTED Final   Astrovirus NOT DETECTED NOT DETECTED Final   Norovirus GI/GII NOT DETECTED NOT DETECTED Final   Rotavirus A NOT DETECTED NOT DETECTED Final   Sapovirus (I, II, IV, and V) NOT DETECTED NOT DETECTED Final    Comment: Performed at [facility], [address] [place], [place] [address]  MRSA Next Gen by PCR, Nasal     Status: None   Collection Time: 02/27/22  5:08 PM   Specimen: Nasal Mucosa; Nasal Swab  Result Value Ref Range Status   MRSA by PCR Next Gen NOT DETECTED NOT DETECTED Final    Comment: (NOTE) The GeneXpert MRSA Assay (FDA approved for NASAL specimens only), is one component of a comprehensive MRSA colonization surveillance program. It is not intended to diagnose MRSA infection nor to guide or monitor treatment for MRSA infections. Test performance is not FDA approved in patients less than 6 years old. Performed at [facility], [address] [place], [place] [address]    Studies/Results: DG Chest  2 View  Result Date: 02/27/2022 CLINICAL DATA:  Weight loss, hyponatremia EXAM: CHEST - 2 VIEW COMPARISON:  04/15/2019 FINDINGS: Normal cardiac and mediastinal contours. Aortic atherosclerosis. No focal pulmonary opacity. Trace pleural effusions versus pleural scarring. No  pneumothorax. Remote right lower rib fractures. No acute osseous abnormality. IMPRESSION: Trace pleural effusions versus pleural scarring. Electronically Signed   By: Merilyn Baba M.D.   On: 02/27/2022 12:21   Medications: I have reviewed the patient's current medications. Prior to Admission:  Medications Prior to Admission  Medication Sig Dispense Refill Last Dose   albuterol (PROVENTIL) (2.5 MG/3ML) 0.083% nebulizer solution Inhale 3 mLs into the lungs every 4 (four) hours as needed for wheezing or shortness of breath.   02/26/2022   aspirin EC 81 MG tablet Take 81 mg by mouth daily.   02/27/2022   atorvastatin (LIPITOR) 40 MG tablet Take 40 mg by mouth daily.   02/27/2022   ipratropium-albuterol (DUONEB) 0.5-2.5 (3) MG/3ML SOLN Inhale 3 mLs into the lungs every 6 (six) hours as needed for wheezing.   02/26/2022   losartan (COZAAR) 50 MG tablet Take 50 mg by mouth daily.   Past Week   metoprolol succinate (TOPROL-XL) 50 MG 24 hr tablet Take 50 mg by mouth daily.   02/27/2022   triamcinolone ointment (KENALOG) 0.1 % Apply topically 2 (two) times daily.   Past Week   TRIJARDY XR 25-09-998 MG TB24 Take 1 tablet by mouth daily.   02/27/2022   albuterol (PROVENTIL HFA;VENTOLIN HFA) 108 (90 Base) MCG/ACT inhaler Inhale 2 puffs into the lungs every 6 (six) hours as needed for wheezing or shortness of breath. 1 Inhaler 2 prn   Scheduled:  aspirin EC  81 mg Oral Daily   Chlorhexidine Gluconate Cloth  6 each Topical Daily   enoxaparin (LOVENOX) injection  40 mg Subcutaneous Q24H   [START ON 25/36/6440] folic acid  1 mg Oral Daily   insulin aspart  0-15 Units Subcutaneous TID WC   insulin aspart  0-5 Units Subcutaneous QHS   insulin  aspart  3 Units Subcutaneous TID WC   insulin detemir  0.3 Units/kg Subcutaneous Q24H   ipratropium-albuterol  3 mL Nebulization TID   losartan  50 mg Oral Daily   metoprolol succinate  50 mg Oral Daily   [START ON 03/01/2022] multivitamin with minerals  1 tablet Oral Daily   nicotine  21 mg Transdermal Daily   [START ON 03/01/2022] thiamine  100 mg Oral Daily   Continuous:  sodium chloride     cefTRIAXone (ROCEPHIN)  IV 1 g (02/28/22 1522)   sodium chloride     HKV:[id number], albuterol, dextromethorphan-guaiFENesin, dextrose, hydrALAZINE, ondansetron (ZOFRAN) IV, mouth rinse Anti-infectives (From admission, onward)    Start     Dose/Rate Route Frequency Ordered Stop   02/27/22 1415  cefTRIAXone (ROCEPHIN) 1 g in sodium chloride 0.9 % 100 mL IVPB        1 g 200 mL/hr over 30 Minutes Intravenous Every 24 hours 02/27/22 1410        Scheduled Meds:  aspirin EC  81 mg Oral Daily   Chlorhexidine Gluconate Cloth  6 each Topical Daily   enoxaparin (LOVENOX) injection  40 mg Subcutaneous Q24H   [START ON 32/95/1884] folic acid  1 mg Oral Daily   insulin aspart  0-15 Units Subcutaneous TID WC   insulin aspart  0-5 Units Subcutaneous QHS   insulin aspart  3 Units Subcutaneous TID WC   insulin detemir  0.3 Units/kg Subcutaneous Q24H   ipratropium-albuterol  3 mL Nebulization TID   losartan  50 mg Oral Daily   metoprolol succinate  50 mg Oral Daily   [START ON 03/01/2022] multivitamin with minerals  1 tablet Oral Daily  nicotine  21 mg Transdermal Daily   [START ON 03/01/2022] thiamine  100 mg Oral Daily   Continuous Infusions:  sodium chloride     cefTRIAXone (ROCEPHIN)  IV 1 g (02/28/22 1522)   sodium chloride     PRN Meds:.acetaminophen, albuterol, dextromethorphan-guaiFENesin, dextrose, hydrALAZINE, ondansetron (ZOFRAN) IV, mouth rinse   Assessment: Principal Problem:   Hyperosmolar hyperglycemic state (HHS) ([place]) Active Problems:   Alcohol abuse   COPD (chronic  obstructive pulmonary disease) (HCC)   HTN (hypertension)   HLD (hyperlipidemia)   CAD (coronary artery disease)   Tobacco abuse   Chronic kidney disease, stage 3b (HCC)   UTI (urinary tract infection)   Diarrhea   Thrombocytopenia (HCC)   Hyperkalemia   Chronic diarrhea of unknown origin   Protein-calorie malnutrition, severe   DM (diabetes mellitus), type 2, uncontrolled, with hyperosmolarity (HCC)  JERRE VANDRUNEN is a 88 y.o. male with history of chronic tobacco use, alcohol use, COPD, poorly controlled diabetes, small bowel obstruction status post ex lap and lysis of adhesions in 03/2019, coronary artery disease, hypertension is admitted with HHS, protein calorie malnutrition, unexplained weight loss, chronic diarrhea.  HHS has resolved  Plan: Unexplained weight loss with chronic diarrhea and protein calorie malnutrition stool studies were negative for infection including C. difficile Given history of chronic tobacco use, need to rule out exocrine pancreatic insufficiency which could also explain his poorly controlled diabetes Recommend CT chest, abdomen and pelvis with contrast once AKI improves to evaluate for any underlying malignancy Check pancreatic fecal elastase levels Recommend upper endoscopy and colonoscopy with possible TI evaluation and biopsies, tentative plan to proceed tomorrow if patient is able to finish the bowel prep and stools are clear Continue clear liquid diet N.p.o. effective 5 AM tomorrow  I have discussed alternative options, risks & benefits,  which include, but are not limited to, bleeding, infection, perforation,respiratory complication & drug reaction.  The patient agrees with this plan & written consent will be obtained.      LOS: 1 day   Avalene Sealy 02/28/2022, 11:21 PM

## 2022-02-28 NOTE — Assessment & Plan Note (Signed)
--  supplements per dietitian

## 2022-02-28 NOTE — Assessment & Plan Note (Addendum)
--  A1c 10.8.  Was not on insulin PTA. --cont Levemir 16u daily --SSI TID mod scale, d/c bed-time SSI --mealtime 3u TID

## 2022-03-01 ENCOUNTER — Inpatient Hospital Stay: Payer: Medicare PPO | Admitting: Anesthesiology

## 2022-03-01 ENCOUNTER — Encounter: Payer: Self-pay | Admitting: Internal Medicine

## 2022-03-01 ENCOUNTER — Encounter
Admission: EM | Disposition: A | Discharge status: Home / Self Care | Payer: Self-pay | Source: Home / Self Care | Attending: Emergency Medicine

## 2022-03-01 ENCOUNTER — Inpatient Hospital Stay: Payer: Medicare PPO

## 2022-03-01 DIAGNOSIS — K2960 Other gastritis without bleeding: Secondary | ICD-10-CM

## 2022-03-01 DIAGNOSIS — R634 Abnormal weight loss: Secondary | ICD-10-CM | POA: Diagnosis not present

## 2022-03-01 DIAGNOSIS — K529 Noninfective gastroenteritis and colitis, unspecified: Secondary | ICD-10-CM | POA: Diagnosis not present

## 2022-03-01 DIAGNOSIS — K6389 Other specified diseases of intestine: Secondary | ICD-10-CM

## 2022-03-01 DIAGNOSIS — K3189 Other diseases of stomach and duodenum: Secondary | ICD-10-CM

## 2022-03-01 DIAGNOSIS — E1100 Type 2 diabetes mellitus with hyperosmolarity without nonketotic hyperglycemic-hyperosmolar coma (NKHHC): Secondary | ICD-10-CM | POA: Diagnosis not present

## 2022-03-01 HISTORY — PX: ESOPHAGOGASTRODUODENOSCOPY: SHX5428

## 2022-03-01 HISTORY — PX: COLONOSCOPY WITH PROPOFOL: SHX5780

## 2022-03-01 LAB — GLUCOSE, CAPILLARY
Glucose-Capillary: 56 mg/dL — ABNORMAL LOW (ref 70–99)
Glucose-Capillary: 124 mg/dL — ABNORMAL HIGH (ref 70–99)
Glucose-Capillary: 127 mg/dL — ABNORMAL HIGH (ref 70–99)
Glucose-Capillary: 154 mg/dL — ABNORMAL HIGH (ref 70–99)
Glucose-Capillary: 170 mg/dL — ABNORMAL HIGH (ref 70–99)
Glucose-Capillary: 227 mg/dL — ABNORMAL HIGH (ref 70–99)

## 2022-03-01 LAB — CBC
HCT: 31.8 % — ABNORMAL LOW (ref 39.0–52.0)
Hemoglobin: 10.3 g/dL — ABNORMAL LOW (ref 13.0–17.0)
MCH: 30.2 pg (ref 26.0–34.0)
MCHC: 32.4 g/dL (ref 30.0–36.0)
MCV: 93.3 fL (ref 80.0–100.0)
Platelets: 110 10*3/uL — ABNORMAL LOW (ref 150–400)
RBC: 3.41 MIL/uL — ABNORMAL LOW (ref 4.22–5.81)
RDW: 14.7 % (ref 11.5–15.5)
WBC: 12.1 10*3/uL — ABNORMAL HIGH (ref 4.0–10.5)
nRBC: 0 % (ref 0.0–0.2)

## 2022-03-01 LAB — BASIC METABOLIC PANEL
Anion gap: 8 (ref 5–15)
BUN: 23 mg/dL (ref 8–23)
CO2: 24 mmol/L (ref 22–32)
Calcium: 8.4 mg/dL — ABNORMAL LOW (ref 8.9–10.3)
Chloride: 106 mmol/L (ref 98–111)
Creatinine, Ser: 1.02 mg/dL (ref 0.61–1.24)
GFR, Estimated: >60 mL/min (ref >60)
Glucose, Bld: 121 mg/dL — ABNORMAL HIGH (ref 70–99)
Potassium: 4.4 mmol/L (ref 3.5–5.1)
Sodium: 138 mmol/L (ref 135–145)

## 2022-03-01 LAB — IRON AND TIBC
Iron: 67 ug/dL (ref 45–182)
Saturation Ratios: 32 % (ref 17.9–39.5)
TIBC: 210 ug/dL — ABNORMAL LOW (ref 250–450)
UIBC: 143 ug/dL

## 2022-03-01 LAB — URINE CULTURE: Culture: >=100000 — AB

## 2022-03-01 LAB — THYROID PANEL WITH TSH
Free Thyroxine Index: 1.7 (ref 1.2–4.9)
T3 Uptake Ratio: 28 % (ref 24–39)
T4, Total: 6 ug/dL (ref 4.5–12.0)
TSH: 0.38 u[IU]/mL — ABNORMAL LOW (ref 0.450–4.500)

## 2022-03-01 LAB — MAGNESIUM: Magnesium: 1.9 mg/dL (ref 1.7–2.4)

## 2022-03-01 LAB — PHOSPHORUS: Phosphorus: 2.9 mg/dL (ref 2.5–4.6)

## 2022-03-01 LAB — FERRITIN: Ferritin: 279 ng/mL (ref 24–336)

## 2022-03-01 LAB — VITAMIN D 25 HYDROXY (VIT D DEFICIENCY, FRACTURES)

## 2022-03-01 SURGERY — EGD (ESOPHAGOGASTRODUODENOSCOPY)
Anesthesia: General

## 2022-03-01 MED ORDER — PANCRELIPASE (LIP-PROT-AMYL) 12000-38000 UNITS PO CPEP
36000.0000 [IU] | ORAL_CAPSULE | Freq: Three times a day (TID) | ORAL | Status: DC
  Administered 2022-03-01 – 2022-03-02 (×4): 36000 [IU] via ORAL
  Filled 2022-03-01: qty 3
  Filled 2022-03-01: qty 1
  Filled 2022-03-01 (×3): qty 3

## 2022-03-01 MED ORDER — PROPOFOL 10 MG/ML IV BOLUS
INTRAVENOUS | Status: DC | PRN
  Administered 2022-03-01: 60 mg via INTRAVENOUS

## 2022-03-01 MED ORDER — LIDOCAINE HCL (CARDIAC) PF 100 MG/5ML IV SOSY
PREFILLED_SYRINGE | INTRAVENOUS | Status: DC | PRN
  Administered 2022-03-01: 50 mg via INTRAVENOUS

## 2022-03-01 MED ORDER — PANTOPRAZOLE SODIUM 40 MG PO TBEC
40.0000 mg | DELAYED_RELEASE_TABLET | Freq: Two times a day (BID) | ORAL | Status: DC
  Administered 2022-03-01 – 2022-03-02 (×3): 40 mg via ORAL
  Filled 2022-03-01 (×3): qty 1

## 2022-03-01 MED ORDER — PROPOFOL 500 MG/50ML IV EMUL
INTRAVENOUS | Status: DC | PRN
  Administered 2022-03-01: 140 ug/kg/min via INTRAVENOUS

## 2022-03-01 MED ORDER — EPHEDRINE SULFATE (PRESSORS) 50 MG/ML IJ SOLN
INTRAMUSCULAR | Status: DC | PRN
  Administered 2022-03-01: 10 mg via INTRAVENOUS

## 2022-03-01 MED ORDER — DEXTROSE 50 % IV SOLN
25.0000 mL | Freq: Once | INTRAVENOUS | Status: AC
  Administered 2022-03-01: 25 mL via INTRAVENOUS
  Filled 2022-03-01: qty 50

## 2022-03-01 MED ORDER — VITAMIN D 25 MCG (1000 UNIT) PO TABS
1000.0000 [IU] | ORAL_TABLET | Freq: Every day | ORAL | Status: DC
  Administered 2022-03-02: 1000 [IU] via ORAL
  Filled 2022-03-01: qty 1

## 2022-03-01 MED ORDER — SPOT INK MARKER SYRINGE KIT
PACK | SUBMUCOSAL | Status: DC | PRN
  Administered 2022-03-01: 2 mL via SUBMUCOSAL

## 2022-03-01 MED ORDER — LOPERAMIDE HCL 2 MG PO CAPS: 2.0000 mg | ORAL_CAPSULE | ORAL | Status: DC | PRN

## 2022-03-01 MED ORDER — INSULIN DETEMIR 100 UNIT/ML ~~LOC~~ SOLN
0.3000 [IU]/kg | SUBCUTANEOUS | Status: DC
  Administered 2022-03-02: 16 [IU] via SUBCUTANEOUS
  Filled 2022-03-01: qty 0.16

## 2022-03-01 MED ORDER — IOHEXOL 300 MG/ML  SOLN
100.0000 mL | Freq: Once | INTRAMUSCULAR | Status: AC | PRN
  Administered 2022-03-01: 100 mL via INTRAVENOUS

## 2022-03-01 MED ORDER — PHENYLEPHRINE 80 MCG/ML (10ML) SYRINGE FOR IV PUSH (FOR BLOOD PRESSURE SUPPORT)
PREFILLED_SYRINGE | INTRAVENOUS | Status: DC | PRN
  Administered 2022-03-01 (×6): 160 ug via INTRAVENOUS

## 2022-03-01 MED ORDER — IOHEXOL 9 MG/ML PO SOLN
500.0000 mL | ORAL | Status: AC
  Administered 2022-03-01 (×2): 500 mL via ORAL

## 2022-03-01 NOTE — Op Note (Signed)
[facility] Patient Name: Micheal [name] Procedure Date: 03/01/2022 1:05 PM MRN: [id number] Account #: [id number] Date of Birth: [birth year] Admit Type: Inpatient Age: 88 Room: [facility] ENDO ROOM 3 Gender: Male Note Status: Finalized Instrument Name: Jasper Riling [id number] Procedure:             Colonoscopy Indications:           Chronic diarrhea, Clinically significant diarrhea of                         unexplained origin, Weight loss Providers:             Lin Landsman MD, MD Referring MD:          No Local Md, MD (Referring MD) Medicines:             General Anesthesia Complications:         No immediate complications. Estimated blood loss: None. Procedure:             Pre-Anesthesia Assessment:                        - Prior to the procedure, a History and Physical was                         performed, and patient medications and allergies were                         reviewed. The patient is competent. The risks and                         benefits of the procedure and the sedation options and                         risks were discussed with the patient. All questions                         were answered and informed consent was obtained.                         Patient identification and proposed procedure were                         verified by the physician, the nurse, the                         anesthesiologist, the anesthetist and the technician                         in the pre-procedure area in the procedure room in the                         endoscopy suite. Mental Status Examination: alert and                         oriented. Airway Examination: normal oropharyngeal                         airway and neck mobility. Respiratory Examination:  clear to auscultation. CV Examination: normal.                         Prophylactic Antibiotics: The patient does not require                         prophylactic  antibiotics. Prior Anticoagulants: The                         patient has taken no previous anticoagulant or                         antiplatelet agents. ASA Grade Assessment: III - A                         patient with severe systemic disease. After reviewing                         the risks and benefits, the patient was deemed in                         satisfactory condition to undergo the procedure. The                         anesthesia plan was to use general anesthesia.                         Immediately prior to administration of medications,                         the patient was re-assessed for adequacy to receive                         sedatives. The heart rate, respiratory rate, oxygen                         saturations, blood pressure, adequacy of pulmonary                         ventilation, and response to care were monitored                         throughout the procedure. The physical status of the                         patient was re-assessed after the procedure.                        After obtaining informed consent, the colonoscope was                         passed under direct vision. Throughout the procedure,                         the patient's blood pressure, pulse, and oxygen                         saturations were monitored continuously. The  Colonoscope was introduced through the anus and                         advanced to the the terminal ileum, with                         identification of the appendiceal orifice and IC                         valve. The colonoscopy was performed without                         difficulty. The patient tolerated the procedure well.                         The quality of the bowel preparation was good. Findings:      The perianal and digital rectal examinations were normal. Pertinent       negatives include normal sphincter tone and no palpable rectal lesions.      The terminal ileum  appeared normal.      A 4 mm polyp was found in the descending colon. The polyp was sessile.       The polyp was removed with a cold snare. Resection and retrieval were       complete. Estimated blood loss: none.      Normal mucosa was found in the entire colon. Biopsies for histology were       taken with a cold forceps from the entire colon for evaluation of       microscopic colitis.      A frond-like/villous and sessile non-obstructing large mass was found at       35 cm proximal to the anus. The mass was partially circumferential       (involving two-thirds of the lumen circumference). No bleeding was       present. Biopsies were taken with a cold forceps for histology. Area was       tattooed with an injection of Spot (carbon black).      Multiple diverticula were found in the sigmoid colon. Impression:            - The examined portion of the ileum was normal.                        - One 4 mm polyp in the descending colon, removed with                         a cold snare. Resected and retrieved.                        - Normal mucosa in the entire examined colon. Biopsied.                        - Rule out malignancy, tumor at 35 cm proximal to the                         anus. Biopsied. Tattooed.                        - Diverticulosis in the sigmoid colon. Recommendation:        -  Return patient to hospital ward for ongoing care.                        - Resume regular diet today.                        - Refer to a surgeon at appointment to be scheduled.                        - Await pathology results.                        - Perform CT scan (computed tomography) of the abdomen                         with contrast at appointment to be scheduled. Procedure Code(s):     --- Professional ---                        [phone], Colonoscopy, flexible; with removal of                         tumor(s), polyp(s), or other lesion(s) by snare                         technique                         45380, 63, Colonoscopy, flexible; with biopsy, single                         or multiple Diagnosis Code(s):     --- Professional ---                        K63.5, Polyp of colon                        D49.0, Neoplasm of unspecified behavior of digestive                         system                        K52.9, Noninfective gastroenteritis and colitis,                         unspecified                        R19.7, Diarrhea, unspecified                        R63.4, Abnormal weight loss                        K57.30, Diverticulosis of large intestine without                         perforation or abscess without bleeding CPT copyright 2019 American Medical [employer]. All rights reserved. The codes documented in this report are preliminary and upon coder review may  be revised to meet current compliance requirements. Dr. Ulyess Mort Tally Due  Marius Ditch MD, MD 03/01/2022 2:01:07 PM This report has been signed electronically. Number of Addenda: 0 Note Initiated On: 03/01/2022 1:05 PM Scope Withdrawal Time: 0 hours 22 minutes 36 seconds  Total Procedure Duration: 0 hours 27 minutes 13 seconds  Estimated Blood Loss:  Estimated blood loss: none. Estimated blood loss: none.      [facility]

## 2022-03-01 NOTE — Inpatient Diabetes Management (Signed)
Inpatient Diabetes Program Recommendations  AACE/ADA: New Consensus Statement on Inpatient Glycemic Control (2015)  Target Ranges:  Prepandial:   less than 140 mg/dL      Peak postprandial:   less than 180 mg/dL (1-2 hours)      Critically ill patients:  140 - 180 mg/dL   Lab Results  Component Value Date   GLUCAP 127 (H) 03/01/2022   HGBA1C 10.8 (H) 02/27/2022    Review of Glycemic Control  Latest Reference Range & Units 02/28/22 15:14 02/28/22 16:45 02/28/22 21:03 03/01/22 04:58 03/01/22 06:03 03/01/22 07:54  Glucose-Capillary 70 - 99 mg/dL 362 (H)  Novolog 10 units  402 (H)   438 (H)  Novolog 18 units 56 (L) 124 (H) 127 (H)  (H): Data is abnormally high (L): Data is abnormally low  Diabetes history: DM2 Outpatient Diabetes medications: Glipizide 2.5 mg QD, Metformin 1000 mg QD Current orders for Inpatient glycemic control:  Levemir 16 units QD, Novolog 0-15 units TID and 3 units TID with meals   Inpatient Diabetes Program Recommendations:    Please reduce correction to-  Novolog 0-9 units TID and 0-5 QHS.  Will continue to follow while inpatient.  Thank you, Reche Dixon, MSN, [place] Diabetes Coordinator Inpatient Diabetes Program [phone] (team pager from 8a-5p)

## 2022-03-01 NOTE — Op Note (Signed)
[facility] Gastroenterology Patient Name: Micheal [name] Procedure Date: 03/01/2022 1:06 PM MRN: [id number] Account #: [id number] Date of Birth: December 27, 1932 Admit Type: Inpatient Age: 88 Room: [facility] ENDO ROOM 3 Gender: Male Note Status: Finalized Instrument Name: Upper Endoscope [id number] Procedure:             Upper GI endoscopy Indications:           Diarrhea, Weight loss Providers:             Lin Landsman MD, MD Referring MD:          No Local Md, MD (Referring MD) Medicines:             General Anesthesia Complications:         No immediate complications. Estimated blood loss: None. Procedure:             Pre-Anesthesia Assessment:                        - Prior to the procedure, a History and Physical was                         performed, and patient medications and allergies were                         reviewed. The patient is competent. The risks and                         benefits of the procedure and the sedation options and                         risks were discussed with the patient. All questions                         were answered and informed consent was obtained.                         Patient identification and proposed procedure were                         verified by the physician, the nurse, the                         anesthesiologist, the anesthetist and the technician                         in the pre-procedure area in the procedure room in the                         endoscopy suite. Mental Status Examination: alert and                         oriented. Airway Examination: normal oropharyngeal                         airway and neck mobility. Respiratory Examination:                         clear to auscultation. CV Examination: normal.  Prophylactic Antibiotics: The patient does not require                         prophylactic antibiotics. Prior Anticoagulants: The                         patient has  taken no previous anticoagulant or                         antiplatelet agents. ASA Grade Assessment: III - A                         patient with severe systemic disease. After reviewing                         the risks and benefits, the patient was deemed in                         satisfactory condition to undergo the procedure. The                         anesthesia plan was to use general anesthesia.                         Immediately prior to administration of medications,                         the patient was re-assessed for adequacy to receive                         sedatives. The heart rate, respiratory rate, oxygen                         saturations, blood pressure, adequacy of pulmonary                         ventilation, and response to care were monitored                         throughout the procedure. The physical status of the                         patient was re-assessed after the procedure.                        After obtaining informed consent, the endoscope was                         passed under direct vision. Throughout the procedure,                         the patient's blood pressure, pulse, and oxygen                         saturations were monitored continuously. The Endoscope                         was introduced through the mouth, and advanced to the  second part of duodenum. The upper GI endoscopy was                         accomplished without difficulty. The patient tolerated                         the procedure well. Findings:      The second portion of the duodenum was normal. Biopsies were taken with       a cold forceps for histology.      Multiple diffuse, small erosions without bleeding were found in the       duodenal bulb.      Multiple dispersed small erosions with stigmata of recent bleeding were       found at the incisura and in the gastric antrum. Biopsies were taken       with a cold forceps for  Helicobacter pylori testing.      The gastric body was normal. Biopsies were taken with a cold forceps for       Helicobacter pylori testing.      The cardia and gastric fundus were normal on retroflexion.      The gastroesophageal junction and examined esophagus were normal. Impression:            - Normal second portion of the duodenum. Biopsied.                        - Erosive duodenopathy without bleeding.                        - Erosive gastropathy with stigmata of recent                         bleeding. Biopsied.                        - Normal gastric body. Biopsied.                        - Normal gastroesophageal junction and esophagus. Recommendation:        - Await pathology results.                        - Proceed with colonoscopy as scheduled                        See colonoscopy report                        - Use Protonix (pantoprazole) 40 mg PO BID for 3                         months.                        - Return to my office in 2 months. Procedure Code(s):     --- Professional ---                        [phone], Esophagogastroduodenoscopy, flexible,                         transoral; with biopsy, single  or multiple Diagnosis Code(s):     --- Professional ---                        K31.89, Other diseases of stomach and duodenum                        K92.2, Gastrointestinal hemorrhage, unspecified                        R19.7, Diarrhea, unspecified                        R63.4, Abnormal weight loss CPT copyright 2019 American Medical Association. All rights reserved. The codes documented in this report are preliminary and upon coder review may  be revised to meet current compliance requirements. Dr. Ulyess Mort Lin Landsman MD, MD 03/01/2022 1:25:59 PM This report has been signed electronically. Number of Addenda: 0 Note Initiated On: 03/01/2022 1:06 PM Estimated Blood Loss:  Estimated blood loss: none.      [facility]

## 2022-03-01 NOTE — Transfer of Care (Signed)
Immediate Anesthesia Transfer of Care Note  Patient: Micheal [name]  Procedure(s) Performed: ESOPHAGOGASTRODUODENOSCOPY (EGD) COLONOSCOPY WITH PROPOFOL  Patient Location: PACU  Anesthesia Type:General  Level of Consciousness: awake, alert  and oriented  Airway & Oxygen Therapy: Patient Spontanous Breathing  Post-op Assessment: Report given to RN and Post -op Vital signs reviewed and stable  Post vital signs: Reviewed and stable  Last Vitals:  Vitals Value Taken Time  BP 102/47 03/01/22 1402  Temp 35.8 C 03/01/22 1402  Pulse 69 03/01/22 1402  Resp 10 03/01/22 1402  SpO2 100 % 03/01/22 1402    Last Pain:  Vitals:   03/01/22 1402  TempSrc: Temporal  PainSc: Asleep         Complications: No notable events documented.

## 2022-03-01 NOTE — Progress Notes (Signed)
PT Cancellation Note  Patient Details Name: Micheal [name] MRN: [id number] DOB: 10-Feb-1933   Cancelled Treatment:    Reason Eval/Treat Not Completed: Patient declined, no reason specified. Chart reviewed. Upon entry to room, pt declining participation with PT as he has to finish his drink prep for a procedure. Will re-attempt as able.    [name]. Fairly IV, PT, DPT Physical Therapist- [facility]  03/01/2022, 3:25 PM

## 2022-03-01 NOTE — Progress Notes (Signed)
Brief Nutrition Follow-Up Note  RD checked labs to check for deficiencies secondary to diarrhea. Vitamin D, vitamin A, niacin, zinc, copper, and thiamine levels still pending. Vitamin B-12 and folate WDL.   RD will continue to follow and make adjustments to care plan accordingly.   Loistine Chance, RD, LDN, [place] Registered Dietitian II Certified Diabetes Care and Education Specialist Please refer to [facility] for RD and/or RD on-call/weekend/after hours pager

## 2022-03-01 NOTE — Anesthesia Postprocedure Evaluation (Signed)
Anesthesia Post Note  Patient: DONAL LYNAM  Procedure(s) Performed: ESOPHAGOGASTRODUODENOSCOPY (EGD) COLONOSCOPY WITH PROPOFOL  Patient location during evaluation: Endoscopy Anesthesia Type: General Level of consciousness: awake and alert Pain management: pain level controlled Vital Signs Assessment: post-procedure vital signs reviewed and stable Respiratory status: spontaneous breathing, nonlabored ventilation, respiratory function stable and patient connected to nasal cannula oxygen Cardiovascular status: blood pressure returned to baseline and stable Postop Assessment: no apparent nausea or vomiting Anesthetic complications: no   No notable events documented.   Last Vitals:  Vitals:   03/01/22 1412 03/01/22 1422  BP: (!) 112/59 114/81  Pulse: 78 79  Resp: 19 (!) 23  Temp:    SpO2: 99% 99%    Last Pain:  Vitals:   03/01/22 1422  TempSrc:   PainSc: 0-No pain                 Precious Haws Yostin Malacara

## 2022-03-01 NOTE — Anesthesia Preprocedure Evaluation (Addendum)
Anesthesia Evaluation  Patient identified by MRN, date of birth, ID band Patient awake    Reviewed: Allergy & Precautions, H&P , NPO status , Patient's Chart, lab work & pertinent test results, reviewed documented beta blocker date and time   Airway Mallampati: II  TM Distance: >3 FB Neck ROM: full    Dental  (+) Missing   Pulmonary shortness of breath and with exertion, COPD, former smoker,    Pulmonary exam normal        Cardiovascular Exercise Tolerance: Poor hypertension, On Medications + CAD, + Past MI (2005) and + DOE   Rhythm:regular Rate:Normal     Neuro/Psych PSYCHIATRIC DISORDERS negative neurological ROS     GI/Hepatic GERD  Medicated,(+)     substance abuse  alcohol use,   Endo/Other  diabetes, Poorly Controlled, Type 2  Renal/GU Renal InsufficiencyRenal disease     Musculoskeletal   Abdominal Normal abdominal exam  (+)   Peds  Hematology thrombocytopenia   Anesthesia Other Findings Per IM Note:   * Hyperosmolar hyperglycemic state (HHS) (HCC) Blood sugar 630, bicarb 20, anion gap normal.  Beta hydroxybutyric acid 0.35, osm 316. --received insulin gtt and IVF   DM (diabetes mellitus), type 2, uncontrolled, with hyperosmolarity (HCC) --A1c 10.8.  Was not on insulin PTA. --cont Levemir 16u daily --increase SSI to mod scale --mealtime 3u TID   Diarrhea Patient reported loose stool and incontinence for the past 6 months.  No nausea, vomiting, abdominal pain.  Etiology is not clear.  Patient has a significant weight loss.  -Check C. difficile, GI pathogen panel neg -consulted Dr. Marius Ditch for GI  UTI (urinary tract infection) - IV Rocephin -Follow-up urine cultures  CAD (coronary artery disease) No chest pain -Continue aspirin, Lipitor, Coreg  Chronic kidney disease, stage 3b ([place]) Renal function slightly worsened than baseline.  Recent baseline creatinine 1.2-1.6.  His creatinine is  1.68, BUN 46  COPD (chronic obstructive pulmonary disease) (HCC) HLD (hyperlipidemia) - Lipitor Hyperkalemia -Patient was given 1 g calcium gluconate -5 g of Lokelma Resolved.    Thrombocytopenia ([place]) - Etiology is not clear.  May be related to his alcohol abuse history HTN (hypertension) - IV hydralazine as needed -Metoprolol, Cozaar Alcohol abuse Patient states he drinks very little recently.  No signs of alcohol withdrawal. -Monitored closely for any signs of withdrawal Tobacco abuse - Nicotine patch Protein-calorie malnutrition, severe --supplements per dietitian    Past Medical History: No date: Balance problem     Comment:  INTERMITTENT WITH NEGATIVE WORKUP No date: COPD (chronic obstructive pulmonary disease) (HCC) No date: Coronary artery disease No date: Diabetes mellitus without complication (HCC) No date: GERD (gastroesophageal reflux disease) No date: Hypercholesteremia No date: Hypertension No date: Myocardial infarction [facility])     Comment:  2005 No date: Pneumonia     Comment:  RECENT 5/17 No date: Shortness of breath dyspnea No date: Wheezing Past Surgical History: No date: APPENDECTOMY No date: CARDIAC CATHETERIZATION 10/04/2015: CATARACT EXTRACTION W/PHACO; Right     Comment:  Procedure: CATARACT EXTRACTION PHACO AND INTRAOCULAR               LENS PLACEMENT (IOC);  Surgeon: [name], MD;                Location: [facility] ORS;  Service: Ophthalmology;  Laterality:              Right;  [place] 02:06 AP% 17.9 CDE 22.65 fluid pack lot #  [id number] 12/13/2015: CATARACT EXTRACTION W/PHACO; Left     Comment:  Procedure: CATARACT EXTRACTION PHACO AND INTRAOCULAR               LENS PLACEMENT (IOC);  Surgeon: [name], MD;                Location: [facility] ORS;  Service: Ophthalmology;  Laterality:              Left;  [place] 1.39 AP% 25.3 CDE 26.37 Fluid pack lot #               [id number] No date: COLONOSCOPY No date: CORONARY  ANGIOPLASTY     Comment:  Stent placement 01/10/2018: ORIF ANKLE FRACTURE; Left     Comment:  Procedure: OPEN REDUCTION INTERNAL FIXATION (ORIF) ANKLE              FRACTURE;  Surgeon: Dereck Leep, MD;  Location: [facility]               ORS;  Service: Orthopedics;  Laterality: Left; BMI    Body Mass Index: 21.93 kg/m     Reproductive/Obstetrics negative OB ROS                           Anesthesia Physical  Anesthesia Plan  ASA: 3  Anesthesia Plan: General   Post-op Pain Management: Minimal or no pain anticipated   Induction: Intravenous  PONV Risk Score and Plan: 3 and Propofol infusion, TIVA and Treatment may vary due to age or medical condition  Airway Management Planned: Natural Airway  Additional Equipment:   Intra-op Plan:   Post-operative Plan:   Informed Consent: I have reviewed the patients History and Physical, chart, labs and discussed the procedure including the risks, benefits and alternatives for the proposed anesthesia with the patient or authorized representative who has indicated his/her understanding and acceptance.     Dental Advisory Given  Plan Discussed with: CRNA  Anesthesia Plan Comments:        Anesthesia Quick Evaluation

## 2022-03-01 NOTE — Plan of Care (Signed)
  Problem: Education: Goal: Ability to describe self-care measures that may prevent or decrease complications (Diabetes Survival Skills Education) will improve Outcome: Progressing   Problem: Coping: Goal: Ability to adjust to condition or change in health will improve Outcome: Progressing   Problem: Fluid Volume: Goal: Ability to maintain a balanced intake and output will improve Outcome: Progressing   Problem: Health Behavior/Discharge Planning: Goal: Ability to identify and utilize available resources and services will improve Outcome: Progressing Goal: Ability to manage health-related needs will improve Outcome: Progressing   Problem: Metabolic: Goal: Ability to maintain appropriate glucose levels will improve Outcome: Progressing   Problem: Nutritional: Goal: Maintenance of adequate nutrition will improve Outcome: Progressing Goal: Progress toward achieving an optimal weight will improve Outcome: Progressing   Problem: Skin Integrity: Goal: Risk for impaired skin integrity will decrease Outcome: Progressing   Problem: Tissue Perfusion: Goal: Adequacy of tissue perfusion will improve Outcome: Progressing   Problem: Education: Goal: Ability to describe self-care measures that may prevent or decrease complications (Diabetes Survival Skills Education) will improve Outcome: Progressing   Problem: Cardiac: Goal: Ability to maintain an adequate cardiac output will improve Outcome: Progressing   Problem: Health Behavior/Discharge Planning: Goal: Ability to identify and utilize available resources and services will improve Outcome: Progressing Goal: Ability to manage health-related needs will improve Outcome: Progressing   Problem: Fluid Volume: Goal: Ability to achieve a balanced intake and output will improve Outcome: Progressing   Problem: Metabolic: Goal: Ability to maintain appropriate glucose levels will improve Outcome: Progressing   Problem:  Nutritional: Goal: Maintenance of adequate nutrition will improve Outcome: Progressing Goal: Maintenance of adequate weight for body size and type will improve Outcome: Progressing   Problem: Respiratory: Goal: Will regain and/or maintain adequate ventilation Outcome: Progressing   Problem: Urinary Elimination: Goal: Ability to achieve and maintain adequate renal perfusion and functioning will improve Outcome: Progressing   Problem: Education: Goal: Knowledge of General Education information will improve Description: Including pain rating scale, medication(s)/side effects and non-pharmacologic comfort measures Outcome: Progressing   Problem: Health Behavior/Discharge Planning: Goal: Ability to manage health-related needs will improve Outcome: Progressing   Problem: Clinical Measurements: Goal: Ability to maintain clinical measurements within normal limits will improve Outcome: Progressing Goal: Will remain free from infection Outcome: Progressing Goal: Diagnostic test results will improve Outcome: Progressing Goal: Respiratory complications will improve Outcome: Progressing Goal: Cardiovascular complication will be avoided Outcome: Progressing   Problem: Activity: Goal: Risk for activity intolerance will decrease Outcome: Progressing   Problem: Nutrition: Goal: Adequate nutrition will be maintained Outcome: Progressing   Problem: Coping: Goal: Level of anxiety will decrease Outcome: Progressing   Problem: Elimination: Goal: Will not experience complications related to bowel motility Outcome: Progressing Goal: Will not experience complications related to urinary retention Outcome: Progressing   Problem: Pain Managment: Goal: General experience of comfort will improve Outcome: Progressing   Problem: Safety: Goal: Ability to remain free from injury will improve Outcome: Progressing   Problem: Skin Integrity: Goal: Risk for impaired skin integrity will  decrease Outcome: Progressing   

## 2022-03-01 NOTE — Anesthesia Procedure Notes (Signed)
Date/Time: 03/01/2022 1:07 PM  Performed by: Johnna Acosta, CRNAPre-anesthesia Checklist: Patient identified, Emergency Drugs available, Suction available, Patient being monitored and Timeout performed Patient Re-evaluated:Patient Re-evaluated prior to induction Oxygen Delivery Method: Nasal cannula Preoxygenation: Pre-oxygenation with 100% oxygen Induction Type: IV induction

## 2022-03-01 NOTE — Assessment & Plan Note (Signed)
--  CT c/a/p w contrast today

## 2022-03-01 NOTE — Assessment & Plan Note (Signed)
--  EGD today, found Erosive gastropathy with stigmata of recent bleeding. --PPI BID

## 2022-03-01 NOTE — Progress Notes (Signed)
PT Cancellation Note  Patient Details Name: Micheal [name] MRN: [id number] DOB: August 14, 1932   Cancelled Treatment:    Reason Eval/Treat Not Completed: Patient at procedure or test/unavailable. Pt currently off floor for procedure. PT to re-attempt when pt is available.    [name]. Fairly IV, PT, DPT Physical Therapist- [facility]  03/01/2022, 1:04 PM

## 2022-03-01 NOTE — Anesthesia Postprocedure Evaluation (Deleted)
Anesthesia Post Note  Patient: Micheal [name]  Procedure(s) Performed: ESOPHAGOGASTRODUODENOSCOPY (EGD) COLONOSCOPY WITH PROPOFOL  Patient location during evaluation: Endoscopy Anesthesia Type: General Level of consciousness: awake and alert Pain management: pain level controlled Vital Signs Assessment: post-procedure vital signs reviewed and stable Respiratory status: spontaneous breathing, nonlabored ventilation and respiratory function stable Cardiovascular status: blood pressure returned to baseline and stable Postop Assessment: no apparent nausea or vomiting Anesthetic complications: no   No notable events documented.   Last Vitals:  Vitals:   03/01/22 0821 03/01/22 1225  BP: (!) 114/51 (!) 120/54  Pulse: 83 (!) 58  Resp: 17 16  Temp: 36.7 C (!) 35.6 C  SpO2: 95% 98%    Last Pain:  Vitals:   03/01/22 1225  TempSrc: Temporal  PainSc: 0-No pain                 [place] Ouch

## 2022-03-01 NOTE — Progress Notes (Signed)
PROGRESS NOTE    Micheal [name]  JYN:[id number] DOB: [birth year] DOA: 02/27/2022 PCP: Tennis Must, MD  129A/129A-AA  LOS: 2 days   Brief hospital course:   Assessment & Plan: Micheal [name] is a 88 y.o. male with medical history significant of hypertension, hyperlipidemia, diabetes mellitus, COPD, GERD, CAD, tobacco abuse, alcohol abuse (has cut down on drinking recently), CKD 3B, small bowel obstruction, who presents with diarrhea, weight loss, increased urinary frequency.   * Hyperosmolar hyperglycemic state (HHS) (HCC) Blood sugar 630, bicarb 20, anion gap normal.  Beta hydroxybutyric acid 0.35, osm 316. --received insulin gtt and IVF   DM (diabetes mellitus), type 2, uncontrolled, with hyperosmolarity (HCC) --A1c 10.8.  Was not on insulin PTA. --cont Levemir 16u daily --SSI TID mod scale, d/c bed-time SSI --mealtime 3u TID  Diarrhea Patient reported loose stool and incontinence for the past 6 months.  No nausea, vomiting, abdominal pain.  Etiology is not clear.  Patient has a significant weight loss.  -Check C. difficile, GI pathogen panel neg --colonoscopy today, found tumor at 35 cm proximal to the anus  UTI (urinary tract infection) Kleb UTI --cont ceftriaxone, day 3 of 3  CAD (coronary artery disease) No chest pain -Continue aspirin, Lipitor, Coreg  Chronic kidney disease, stage 3b ([place]) Renal function slightly worsened than baseline.  Recent baseline creatinine 1.2-1.6.  His creatinine is 1.68, BUN 46 -Follow-up with BMP -Hold ibuprofen  COPD (chronic obstructive pulmonary disease) (HCC) Ruled out exacerbation --prednisone started on admission, d/c'ed   HLD (hyperlipidemia) - Lipitor  Hyperkalemia -Patient was given 1 g calcium gluconate -5 g of Lokelma Resolved.    Thrombocytopenia (HCC) Platelets 79, mental status normal.  Etiology is not clear.  May be related to his alcohol abuse history  HTN (hypertension) - IV hydralazine as  needed -Metoprolol, Cozaar  Alcohol abuse Patient states he drinks very little recently.  No signs of alcohol withdrawal. -Monitored closely for any signs of withdrawal  Tobacco abuse - Nicotine patch  Erosive gastropathy --EGD today, found Erosive gastropathy with stigmata of recent bleeding. --PPI BID  Colonic mass --CT c/a/p w contrast today  Protein-calorie malnutrition, severe --supplements per dietitian   DVT prophylaxis: Lovenox SQ Code Status: Full code  Family Communication:  Level of care: Med-Surg Dispo:   The patient is from: home Anticipated d/c is to: home Anticipated d/c date is: likely tomorrow   Subjective and Interval History:  Denied pain.  Went for EGD and colonoscopy today.   Objective: Vitals:   03/01/22 1402 03/01/22 1412 03/01/22 1422 03/01/22 1522  BP: (!) 102/47 (!) 112/59 114/81 125/64  Pulse: 69 78 79 60  Resp: 10 19 (!) 23 17  Temp: (!) 96.5 F (35.8 C)   98.4 F (36.9 C)  TempSrc: Temporal     SpO2: 100% 99% 99% 97%  Weight:      Height:        Intake/Output Summary (Last 24 hours) at 03/01/2022 2035 Last data filed at 03/01/2022 1900 Gross per 24 hour  Intake 536.03 ml  Output 200 ml  Net 336.03 ml   Filed Weights   02/27/22 1040 03/01/22 1225  Weight: 53.1 kg 53 kg    Examination:   Constitutional: NAD, AAOx3 HEENT: conjunctivae and lids normal, EOMI CV: No cyanosis.   RESP: normal respiratory effort, on RA Neuro: II - XII grossly intact.   Psych: Normal mood and affect.  Appropriate judgement and reason   Data Reviewed: I have personally reviewed labs  and imaging studies  Time spent: 50 minutes  Enzo Bi, MD [facility] If 7PM-7AM, please contact night-coverage 03/01/2022, 8:35 PM

## 2022-03-02 ENCOUNTER — Encounter: Payer: Self-pay | Admitting: Gastroenterology

## 2022-03-02 DIAGNOSIS — E1100 Type 2 diabetes mellitus with hyperosmolarity without nonketotic hyperglycemic-hyperosmolar coma (NKHHC): Secondary | ICD-10-CM | POA: Diagnosis not present

## 2022-03-02 LAB — CBC
HCT: 30.7 % — ABNORMAL LOW (ref 39.0–52.0)
Hemoglobin: 9.8 g/dL — ABNORMAL LOW (ref 13.0–17.0)
MCH: 30.2 pg (ref 26.0–34.0)
MCHC: 31.9 g/dL (ref 30.0–36.0)
MCV: 94.5 fL (ref 80.0–100.0)
Platelets: 111 10*3/uL — ABNORMAL LOW (ref 150–400)
RBC: 3.25 MIL/uL — ABNORMAL LOW (ref 4.22–5.81)
RDW: 15 % (ref 11.5–15.5)
WBC: 9.8 10*3/uL (ref 4.0–10.5)
nRBC: 0 % (ref 0.0–0.2)

## 2022-03-02 LAB — VITAMIN A: Vitamin A (Retinoic Acid): 36.2 ug/dL (ref 22.0–69.5)

## 2022-03-02 LAB — GLUCOSE, CAPILLARY
Glucose-Capillary: 278 mg/dL — ABNORMAL HIGH (ref 70–99)
Glucose-Capillary: 444 mg/dL — ABNORMAL HIGH (ref 70–99)
Glucose-Capillary: 414 mg/dL — ABNORMAL HIGH (ref 70–99)
Glucose-Capillary: 291 mg/dL — ABNORMAL HIGH (ref 70–99)

## 2022-03-02 LAB — BASIC METABOLIC PANEL
Anion gap: 5 (ref 5–15)
BUN: 21 mg/dL (ref 8–23)
CO2: 26 mmol/L (ref 22–32)
Calcium: 8.3 mg/dL — ABNORMAL LOW (ref 8.9–10.3)
Chloride: 104 mmol/L (ref 98–111)
Creatinine, Ser: 1.31 mg/dL — ABNORMAL HIGH (ref 0.61–1.24)
GFR, Estimated: 53 mL/min — ABNORMAL LOW (ref >60)
Glucose, Bld: 335 mg/dL — ABNORMAL HIGH (ref 70–99)
Potassium: 4.7 mmol/L (ref 3.5–5.1)
Sodium: 135 mmol/L (ref 135–145)

## 2022-03-02 LAB — MAGNESIUM: Magnesium: 1.8 mg/dL (ref 1.7–2.4)

## 2022-03-02 MED ORDER — BLOOD GLUCOSE METER KIT: PACK | 0 refills | Status: DC

## 2022-03-02 MED ORDER — PANTOPRAZOLE SODIUM 40 MG PO TBEC: 40.0000 mg | DELAYED_RELEASE_TABLET | Freq: Two times a day (BID) | ORAL | 2 refills | Status: DC

## 2022-03-02 MED ORDER — GLIPIZIDE 5 MG PO TABS: 2.5000 mg | ORAL_TABLET | Freq: Two times a day (BID) | ORAL | 2 refills | Status: DC

## 2022-03-02 MED ORDER — FOLIC ACID 1 MG PO TABS: 1.0000 mg | ORAL_TABLET | Freq: Every day | ORAL | Status: DC

## 2022-03-02 MED ORDER — LIVING WELL WITH DIABETES BOOK
Freq: Once | Status: AC
  Filled 2022-03-02: qty 1

## 2022-03-02 MED ORDER — NICOTINE 21 MG/24HR TD PT24: 21.0000 mg | MEDICATED_PATCH | Freq: Every day | TRANSDERMAL | 0 refills | Status: DC

## 2022-03-02 MED ORDER — THIAMINE HCL 100 MG PO TABS: 100.0000 mg | ORAL_TABLET | Freq: Every day | ORAL | Status: DC

## 2022-03-02 MED ORDER — INSULIN PEN NEEDLE 32G X 4 MM MISC: 1.0000 [IU] | Freq: Every day | 2 refills | Status: DC

## 2022-03-02 MED ORDER — ADULT MULTIVITAMIN W/MINERALS CH: 1.0000 | ORAL_TABLET | Freq: Every day | ORAL | Status: DC

## 2022-03-02 MED ORDER — VITAMIN D3 25 MCG PO TABS: 1000.0000 [IU] | ORAL_TABLET | Freq: Every day | ORAL | Status: DC

## 2022-03-02 MED ORDER — LANTUS SOLOSTAR 100 UNIT/ML ~~LOC~~ SOPN: 16.0000 [IU] | PEN_INJECTOR | Freq: Every day | SUBCUTANEOUS | 2 refills | Status: DC

## 2022-03-02 MED ORDER — PANCRELIPASE (LIP-PROT-AMYL) 36000-114000 UNITS PO CPEP: 36000.0000 [IU] | ORAL_CAPSULE | Freq: Three times a day (TID) | ORAL | 2 refills | Status: DC

## 2022-03-02 NOTE — Consult Note (Signed)
Subjective:   CC: Sigmoid mass  HPI:  Micheal [name] is a 88 y.o. male who was consulted by Billie Ruddy for issue above.  Presented to ED for weight loss, chronic diarrhea.  Work-up including colonoscopy by GI noted a sigmoid mass concerning for malignancy.  Final pathology currently pending.  Patient states he has felt fatigued and has been losing weight for some time now.  Denies any blood in the stools but does endorse chronic diarrhea.  Denies any abdominal pain.   Past Medical History:  has a past medical history of Balance problem, COPD (chronic obstructive pulmonary disease) ([place]), Coronary artery disease, Diabetes mellitus without complication ([place]), GERD (gastroesophageal reflux disease), Hypercholesteremia, Hypertension, Myocardial infarction ([place]), Pneumonia, Shortness of breath dyspnea, and Wheezing.  Past Surgical History:  Past Surgical History:  Procedure Laterality Date   APPENDECTOMY     CARDIAC CATHETERIZATION     CATARACT EXTRACTION W/PHACO Right 10/04/2015   Procedure: CATARACT EXTRACTION PHACO AND INTRAOCULAR LENS PLACEMENT (IOC);  Surgeon: [name], MD;  Location: [facility] ORS;  Service: Ophthalmology;  Laterality: Right;  [place] 02:06 AP% 17.9 CDE 22.65 fluid pack lot # [id number]   CATARACT EXTRACTION W/PHACO Left 12/13/2015   Procedure: CATARACT EXTRACTION PHACO AND INTRAOCULAR LENS PLACEMENT (IOC);  Surgeon: [name], MD;  Location: [facility] OR[facility];  Service: Ophthalmology;  Laterality: Left;  [place] 1.39 AP% 25.3 CDE 26.37 Fluid pack lot # [id number]   COLONOSCOPY     CORONARY ANGIOPLASTY     Stent placement   LAPAROTOMY N/A 03/30/2019   Procedure: EXPLORATORY LAPAROTOMY, POSSIBLE BOWEL RESECTION,;  Surgeon: Olean Ree, MD;  Location: [facility] ORS;  Service: General;  Laterality: N/A;   ORIF ANKLE FRACTURE Left 01/10/2018   Procedure: OPEN REDUCTION INTERNAL FIXATION (ORIF) ANKLE FRACTURE;  Surgeon: Dereck Leep, MD;  Location: [facility] OR[facility];  Service: Orthopedics;  Laterality:  Left;    Family History: family history is not on file.  Social History:  reports that he has quit smoking. He has quit using smokeless tobacco. He reports that he does not drink alcohol and does not use drugs.  Current Medications:  Prior to Admission medications   Medication Sig Start Date End Date Taking? Authorizing Provider  albuterol (PROVENTIL) (2.5 MG/3ML) 0.083% nebulizer solution Inhale 3 mLs into the lungs every 4 (four) hours as needed for wheezing or shortness of breath. 08/26/17  Yes [provider]  aspirin EC 81 MG tablet Take 81 mg by mouth daily. 08/26/17  Yes [provider]  atorvastatin (LIPITOR) 40 MG tablet Take 40 mg by mouth daily.   Yes [provider]  ipratropium-albuterol (DUONEB) 0.5-2.5 (3) MG/3ML SOLN Inhale 3 mLs into the lungs every 6 (six) hours as needed for wheezing. 08/26/17  Yes [provider]  losartan (COZAAR) 50 MG tablet Take 50 mg by mouth daily. 12/10/21  Yes [provider]  metoprolol succinate (TOPROL-XL) 50 MG 24 hr tablet Take 50 mg by mouth daily. 12/10/21  Yes [provider]  triamcinolone ointment (KENALOG) 0.1 % Apply topically 2 (two) times daily. 11/15/21  Yes [provider]  TRIJARDY XR 25-09-998 MG TB24 Take 1 tablet by mouth daily. 01/01/22  Yes [provider]  albuterol (PROVENTIL HFA;VENTOLIN HFA) 108 (90 Base) MCG/ACT inhaler Inhale 2 puffs into the lungs every 6 (six) hours as needed for wheezing or shortness of breath. 08/21/17   [name], MD    Allergies:  Allergies as of 02/27/2022   (No Known Allergies)    ROS:  General: Denies weight gain, fatigue, fevers, chills, and night sweats. Eyes: Denies blurry vision, double vision, eye pain, itchy eyes, and tearing. Ears: Denies hearing loss, earache, and ringing in ears. Nose: Denies sinus pain, congestion, infections, runny nose, and nosebleeds. Mouth/throat: Denies hoarseness, sore throat, bleeding  gums, and difficulty swallowing. Heart: Denies chest pain, palpitations, racing heart, irregular heartbeat, leg pain or swelling, and decreased activity tolerance. Respiratory: Denies breathing difficulty, shortness of breath, wheezing, cough, and sputum. GI: Denies change in appetite, heartburn, nausea, vomiting, constipation, and blood in stool. GU: Denies difficulty urinating, pain with urinating, urgency, frequency, blood in urine. Musculoskeletal: Denies joint stiffness, pain, swelling, muscle weakness. Skin: Denies rash, itching, mass, tumors, sores, and boils Neurologic: Denies headache, fainting, dizziness, seizures, numbness, and tingling. Psychiatric: Denies depression, anxiety, difficulty sleeping, and memory loss. Endocrine: Denies heat or cold intolerance, and increased thirst or urination. Blood/lymph: Denies easy bruising, easy bruising, and swollen glands     Objective:     BP (!) 110/59 (BP Location: Right Arm)   Pulse 99   Temp 98.5 F (36.9 C)   Resp 18   Ht '5\' 6"'$  (1.676 m)   Wt 53 kg   SpO2 95%   BMI 18.86 kg/m   Constitutional :  alert, cooperative, appears stated age, and no distress  Lymphatics/Throat:  no asymmetry, masses, or scars  Respiratory:  Active expiratory wheeze  Cardiovascular:  regular rate and rhythm  Gastrointestinal: soft, non-tender; bowel sounds normal; no masses,  no organomegaly.   Musculoskeletal: Steady movement  Skin: Cool and moist, periumbilical surgical scar   Psychiatric: Normal affect, non-agitated, not confused       LABS:     Latest Ref Rng & Units 03/02/2022    5:51 AM 03/01/2022    5:35 AM 02/28/2022    9:58 PM  CMP  Glucose 70 - 99 mg/dL 335  121  370   BUN 8 - 23 mg/dL 21  23    Creatinine 0.61 - 1.24 mg/dL 1.31  1.02    Sodium 135 - 145 mmol/L 135  138    Potassium 3.5 - 5.1 mmol/L 4.7  4.4    Chloride 98 - 111 mmol/L 104  106    CO2 22 - 32 mmol/L 26  24    Calcium 8.9 - 10.3 mg/dL 8.3  8.4        Latest  Ref Rng & Units 03/02/2022    5:51 AM 03/01/2022    5:35 AM 02/28/2022    1:38 AM  CBC  WBC 4.0 - 10.5 K/uL 9.8  12.1  10.5   Hemoglobin 13.0 - 17.0 g/dL 9.8  10.3  10.5   Hematocrit 39.0 - 52.0 % 30.7  31.8  32.2   Platelets 150 - 400 K/uL 111  110  90     RADS: CLINICAL DATA:  Colon mass.  Looking for metastasis.   EXAM: CT CHEST, ABDOMEN, AND PELVIS WITH CONTRAST   TECHNIQUE: Multidetector CT imaging of the chest, abdomen and pelvis was performed following the standard protocol during bolus administration of intravenous contrast.   RADIATION DOSE REDUCTION: This exam was performed according to the departmental dose-optimization program which includes automated exposure control, adjustment of the mA and/or kV according to patient size and/or use of iterative reconstruction technique.   CONTRAST:  181m OMNIPAQUE IOHEXOL 300 MG/ML  SOLN   COMPARISON:  04/02/2019   FINDINGS: CT CHEST FINDINGS   Cardiovascular: Normal heart size. No pericardial effusions. Calcification of the  aorta and coronary arteries. No aortic aneurysm or dissection. Great vessel origins are patent.   Mediastinum/Nodes: Peripherally calcified low-attenuation lesion measuring 1.8 cm located posterior to the right thyroid and lateral to the esophagus. Likely representing an exophytic thyroid nodule. No change since prior study. Esophagus is decompressed but demonstrates residual gas and fluid suggesting reflux or dysmotility. Mild diffuse esophageal wall thickening suggesting reflux disease or esophagitis. No lymphadenopathy.   Lungs/Pleura: Emphysematous changes in the lungs. Several focal areas of subpleural scarring and pleural thickening. No focal pulmonary nodule or consolidation. Minimal bilateral pleural effusions with mild bilateral basilar atelectasis.   Musculoskeletal: Old bilateral rib fractures. No focal bone lesion or bone destruction.   CT ABDOMEN PELVIS FINDINGS    Hepatobiliary: No focal liver lesions. Mild gallbladder wall thickening and edema. No stones identified. No bile duct dilatation.   Pancreas: Unremarkable. No pancreatic ductal dilatation or surrounding inflammatory changes.   Spleen: Normal in size without focal abnormality.   Adrenals/Urinary Tract: No adrenal gland nodules. Kidneys are symmetrical with small subcentimeter cysts present. No imaging follow-up is indicated. No hydronephrosis. Bladder is moderately distended, possibly physiologic or outlet obstruction. No bladder wall thickening or stones. Small bladder diverticulum at the dome of the bladder.   Stomach/Bowel: Stomach, small bowel, and colon are not abnormally distended. Contrast material flows to the descending colon suggesting no evidence of obstruction. Anastomosis at the rectosigmoid junction appears patent. Appendix is not identified.   Vascular/Lymphatic: Aortic atherosclerosis. No enlarged abdominal or pelvic lymph nodes.   Reproductive: Uterus and bilateral adnexa are unremarkable.   Other: No abdominal wall hernia or abnormality. No abdominopelvic ascites.   Musculoskeletal: Degenerative changes. No focal bone lesions are identified.   IMPRESSION: 1. No evidence of metastatic disease in the chest, abdomen, or pelvis. 2. Stable appearance of peripherally calcified 1.8 cm right thyroid lesion. This has been evaluated on previous imaging. (ref: J Am Coll Radiol. 2015 Feb;12(2): 143-50). 3. Mild bladder wall thickening is nonspecific, possibly physiologic, inflammatory or related to liver disease. No stones. 4. Distended bladder with bladder diverticula. 5. Aortic atherosclerosis. 6. Emphysematous changes in the lungs. Minimal bilateral pleural effusions and basilar atelectasis.     Electronically Signed   By: Lucienne Capers M.D.   On: 03/01/2022 19:13   Assessment:   Sigmoid mass noted on pathology.  Likely malignant.  Patient would like to  proceed with surgery despite the high risk.  Plan:     The risk of surgery include, but not limited to, recurrence, bleeding, chronic pain, post-op infxn, post-op SBO or ileus, hernias, resection of bowel, re-anastamosis, possible ostomy placement and need for re-operation to address said risks. The risks of general anesthetic, if used, includes MI, CVA, sudden death or even reaction to anesthetic medications also discussed. Alternatives include continued observation and NG decompression.  Benefits include possible symptom relief, preventing further decline in health and possible death.  Typical post-op recovery time of additional days in hospital for observation afterwards also discussed.  The patient verbalized understanding and all questions were answered to the patient's satisfaction.  Discussed with son as well and he would like to honor his father's wishes of proceeding with surgery.  Both understand the high risk nature of the procedure secondary to patient comorbidities including COPD, heart disease, malnutrition, uncontrolled diabetes.  They understand postoperative complications can result in prolonged hospitalization, extended decrease in quality of life, in addition to the above noted risks specific to the surgery.  We will proceed with scheduling case  in the near future.  Patient is okay to be discharged and return as an outpatient from surgery standpoint.  labs/images/medications/previous chart entries reviewed personally and relevant changes/updates noted above.

## 2022-03-02 NOTE — Inpatient Diabetes Management (Addendum)
Inpatient Diabetes Program Recommendations  AACE/ADA: New Consensus Statement on Inpatient Glycemic Control (2015)  Target Ranges:  Prepandial:   less than 140 mg/dL      Peak postprandial:   less than 180 mg/dL (1-2 hours)      Critically ill patients:  140 - 180 mg/dL    Latest Reference Range & Units 02/27/22 14:12  Hemoglobin A1C 4.8 - 5.6 % 10.8 (H)  263 mg/dl  (H): Data is abnormally high  Latest Reference Range & Units 03/01/22 04:58 03/01/22 06:03 03/01/22 07:54 03/01/22 11:25 03/01/22 15:25 03/01/22 21:56  Glucose-Capillary 70 - 99 mg/dL 56 (L) 124 (H) 127 (H) 154 (H) 170 (H)  3 units Novolog  227 (H)    Latest Reference Range & Units 03/02/22 08:32  Glucose-Capillary 70 - 99 mg/dL 278 (H)  11 units Novolog  16 units Levemir  (H): Data is abnormally high    Admit with:  Hyperosmolar hyperglycemic state  Diarrhea UTI  History: DM  Home DM Meds: Glipizide 2.5 mg Daily      Metformin 1000 mg Daily  Current Orders: Levemir 16 units Daily      Novolog Moderate Correction Scale/ SSI (0-15 units) TID AC       Novolog 3 units TID with meals    Patient was seen by the Diabetes Coordinator 10/11 and was educated on using insulin pens at home  MD- Please note pt did not receive any Levemir insulin yesterday--CBG 278 this AM  Due for dose Levemir this AM (16 units)    For DC please order:   Glucometer Order # [id number] Insulin pen needles Order # [id number] Lantus Solostar insulin pen Order # [id number] (co-pay $10.35) Novolog Flex pen Order # [id number] (co-pay-$10.35)   Given age,  might be appropriate to discharge on basal only and continue Metformin & Glipizide??    Addendum 12:20pm--Met w/ pt at bedside to discuss going home on insulin.  Pt told me his wife lives in the home but they both live separately and he takes care of all his meds and needs himself.  Son lives in an apartment over the garage.  Pt told me he has not practiced giving insulin yet but is OK going  home on insulin.  Have asked the RNs to allow pt to give all his injections for practice.  Pt gave me permission to call his son and send his son a video on using insulin pens at home so son can assist pt at home if needed.  Have attached Insulin pen video (QR code) and written insulin pen education to d/c instructions.  Called son and he is agreeable to helping pt with injections if needed.  I asked him if I could send an insulin pen video to his email and son told me he doesn't know how to use email.  When son comes to visit pt, RNs can show son the insulin pen as well.    Educated patient on insulin pen use at home.  Reviewed all steps of insulin pen including attachment of needle, 2-unit air shot, dialing up dose, giving injection, rotation of injection sites, removing needle, disposal of sharps, storage of unused insulin, disposal of insulin etc.  Patient able to provide successful return demonstration with minor reinforcement.  Reviewed troubleshooting with insulin pen.  Also reviewed Signs/Symptoms of Hypoglycemia with patient and how to treat Hypoglycemia at home.  Have asked RNs caring for patient to please allow patient to give all injections here in hospital as  much as possible for practice.      --Will follow patient during hospitalization--  Wyn Quaker RN, MSN, [place] Diabetes Coordinator Inpatient Glycemic Control Team Team Pager: [phone] (8a-5p)

## 2022-03-02 NOTE — Progress Notes (Signed)
Occupational Therapy Treatment Patient Details Name: Micheal [name] MRN: [id number] DOB: 07/26/1932 Today's Date: 03/02/2022   History of present illness Pt is an 88 year old male presenting with diarrhea, weight loss, increased urinary frequency, admitted with hyperosmolar hyperglycemic state, hyperkalemia, and UTI.  PMH significant for hypertension, hyperlipidemia, diabetes mellitus, COPD, GERD, CAD, tobacco abuse, alcohol abuse (has cut down on drinking recently), CKD 3B, small bowel obstruction.   OT comments  Chart reviewed, pt greeted in bed agreeable to OT tx session. Pt declines self care tasks on this date, agreeable to mobility. Tx session targeted improving functional mobility in preparation for ADL tasks. Pt amb 240' in hallway with RW with supervision. Educated re: use of bsc at home. Pt is left as received, all needs met. OT will continue to follow acutely.    Recommendations for follow up therapy are one component of a multi-disciplinary discharge planning process, led by the attending physician.  Recommendations may be updated based on patient status, additional functional criteria and insurance authorization.    Follow Up Recommendations  Home health OT    Assistance Recommended at Discharge Intermittent Supervision/Assistance  Patient can return home with the following  A little help with walking and/or transfers;A little help with bathing/dressing/bathroom   Equipment Recommendations  BSC/3in1    Recommendations for Other Services      Precautions / Restrictions Precautions Precautions: Fall Restrictions Weight Bearing Restrictions: No       Mobility Bed Mobility Overal bed mobility: Modified Independent                  Transfers Overall transfer level: Needs assistance Equipment used: Rolling walker (2 wheels) Transfers: Sit to/from Stand Sit to Stand: Supervision                 Balance Overall balance assessment: Needs  assistance Sitting-balance support: Feet supported Sitting balance-Leahy Scale: Good     Standing balance support: Single extremity supported, During functional activity, Reliant on assistive device for balance Standing balance-Leahy Scale: Good                             ADL either performed or assessed with clinical judgement   ADL Overall ADL's : Needs assistance/impaired                                     Functional mobility during ADLs: Supervision/safety;Rolling walker (2 wheels) (approx 240' in hallway)      Extremity/Trunk Assessment              Vision       Perception     Praxis      Cognition Arousal/Alertness: Awake/alert Behavior During Therapy: WFL for tasks assessed/performed Overall Cognitive Status: Within Functional Limits for tasks assessed                                          Exercises      Shoulder Instructions       General Comments      Pertinent Vitals/ Pain       Pain Assessment Pain Assessment: No/denies pain  Home Living  Prior Functioning/Environment              Frequency  Min 2X/week        Progress Toward Goals  OT Goals(current goals can now be found in the care plan section)  Progress towards OT goals: Progressing toward goals     Plan Discharge plan remains appropriate    Co-evaluation                 AM-PAC OT "6 Clicks" Daily Activity     Outcome Measure   Help from another person eating meals?: None Help from another person taking care of personal grooming?: None Help from another person toileting, which includes using toliet, bedpan, or urinal?: A Little Help from another person bathing (including washing, rinsing, drying)?: A Little Help from another person to put on and taking off regular upper body clothing?: None Help from another person to put on and taking off regular lower  body clothing?: A Little 6 Click Score: 21    End of Session Equipment Utilized During Treatment: Rolling walker (2 wheels)  OT Visit Diagnosis: Unsteadiness on feet (R26.81);Muscle weakness (generalized) (M62.81)   Activity Tolerance Patient tolerated treatment well   Patient Left with call bell/phone within reach;in bed   Nurse Communication Mobility status        Time: 4098-1191 OT Time Calculation (min): 8 min  Charges: OT General Charges $OT Visit: 1 Visit OT Treatments $Therapeutic Activity: 8-22 mins  Shanon Payor, OTD OTR/L  03/02/22, 3:58 PM

## 2022-03-02 NOTE — Plan of Care (Signed)
Problem: Education: Goal: Ability to describe self-care measures that may prevent or decrease complications (Diabetes Survival Skills Education) will improve Outcome: Adequate for Discharge   Problem: Coping: Goal: Ability to adjust to condition or change in health will improve Outcome: Adequate for Discharge   Problem: Fluid Volume: Goal: Ability to maintain a balanced intake and output will improve Outcome: Adequate for Discharge   Problem: Health Behavior/Discharge Planning: Goal: Ability to identify and utilize available resources and services will improve Outcome: Adequate for Discharge Goal: Ability to manage health-related needs will improve Outcome: Adequate for Discharge   Problem: Metabolic: Goal: Ability to maintain appropriate glucose levels will improve Outcome: Adequate for Discharge   Problem: Nutritional: Goal: Maintenance of adequate nutrition will improve Outcome: Adequate for Discharge Goal: Progress toward achieving an optimal weight will improve Outcome: Adequate for Discharge   Problem: Skin Integrity: Goal: Risk for impaired skin integrity will decrease Outcome: Adequate for Discharge   Problem: Tissue Perfusion: Goal: Adequacy of tissue perfusion will improve Outcome: Adequate for Discharge   Problem: Education: Goal: Ability to describe self-care measures that may prevent or decrease complications (Diabetes Survival Skills Education) will improve Outcome: Adequate for Discharge   Problem: Cardiac: Goal: Ability to maintain an adequate cardiac output will improve Outcome: Adequate for Discharge   Problem: Health Behavior/Discharge Planning: Goal: Ability to identify and utilize available resources and services will improve Outcome: Adequate for Discharge Goal: Ability to manage health-related needs will improve Outcome: Adequate for Discharge   Problem: Fluid Volume: Goal: Ability to achieve a balanced intake and output will  improve Outcome: Adequate for Discharge   Problem: Metabolic: Goal: Ability to maintain appropriate glucose levels will improve Outcome: Adequate for Discharge   Problem: Nutritional: Goal: Maintenance of adequate nutrition will improve Outcome: Adequate for Discharge Goal: Maintenance of adequate weight for body size and type will improve Outcome: Adequate for Discharge   Problem: Respiratory: Goal: Will regain and/or maintain adequate ventilation Outcome: Adequate for Discharge   Problem: Urinary Elimination: Goal: Ability to achieve and maintain adequate renal perfusion and functioning will improve Outcome: Adequate for Discharge   Problem: Education: Goal: Knowledge of General Education information will improve Description: Including pain rating scale, medication(s)/side effects and non-pharmacologic comfort measures Outcome: Adequate for Discharge   Problem: Health Behavior/Discharge Planning: Goal: Ability to manage health-related needs will improve Outcome: Adequate for Discharge   Problem: Clinical Measurements: Goal: Ability to maintain clinical measurements within normal limits will improve Outcome: Adequate for Discharge Goal: Will remain free from infection Outcome: Adequate for Discharge Goal: Diagnostic test results will improve Outcome: Adequate for Discharge Goal: Respiratory complications will improve Outcome: Adequate for Discharge Goal: Cardiovascular complication will be avoided Outcome: Adequate for Discharge   Problem: Activity: Goal: Risk for activity intolerance will decrease Outcome: Adequate for Discharge   Problem: Nutrition: Goal: Adequate nutrition will be maintained Outcome: Adequate for Discharge   Problem: Coping: Goal: Level of anxiety will decrease Outcome: Adequate for Discharge   Problem: Elimination: Goal: Will not experience complications related to bowel motility Outcome: Adequate for Discharge Goal: Will not experience  complications related to urinary retention Outcome: Adequate for Discharge   Problem: Pain Managment: Goal: General experience of comfort will improve Outcome: Adequate for Discharge   Problem: Safety: Goal: Ability to remain free from injury will improve Outcome: Adequate for Discharge   Problem: Skin Integrity: Goal: Risk for impaired skin integrity will decrease Outcome: Adequate for Discharge  Patient discharged to home with home health. DME sent with patient  long with belongings. Discharge instructions given to patient and son. State understanding.

## 2022-03-02 NOTE — Care Management Important Message (Signed)
Important Message  Patient Details  Name: Micheal [name] MRN: [id number] Date of Birth: 11-18-1932   Medicare Important Message Given:  Other (see comment)  Patient is out of room.    Juliann Pulse A [name] 03/02/2022, 2:41 PM

## 2022-03-02 NOTE — Consult Note (Signed)
[place]  [phone] [phone]  ID: Micheal [name] OB: 1932-11-01  MR#: [id number]  OFB#:[id number]  Patient Care Team: Tennis Must, MD as PCP - General (Internal Medicine)  CHIEF COMPLAINT: Colon cancer  INTERVAL HISTORY: Patient is an 88 year old male with multiple medical problems who initially presented to the emergency room with weight loss and chronic diarrhea.  Work-up included CT scans and colonoscopy.  Luminal evaluation noted a colon mass highly suspicious for underlying malignancy.  Patient feels well and improved since admission.  He has no neurologic complaints.  He denies any recent fevers or illnesses.  He has a poor appetite and endorses weight loss.  He has no chest pain, shortness of breath, cough, or hemoptysis.  He denies any nausea, vomiting, or constipation.  He has no melena or hematochezia.  His diarrhea has improved.  He has no urinary complaints.  Patient offers no further specific complaints today.  REVIEW OF SYSTEMS:   Review of Systems  Constitutional:  Positive for malaise/fatigue and weight loss. Negative for fever.  Respiratory: Negative.  Negative for cough, hemoptysis and shortness of breath.   Cardiovascular: Negative.  Negative for chest pain and leg swelling.  Gastrointestinal:  Positive for diarrhea. Negative for abdominal pain, blood in stool and melena.  Genitourinary: Negative.  Negative for dysuria.  Musculoskeletal: Negative.  Negative for back pain.  Skin: Negative.  Negative for rash.  Neurological:  Positive for weakness. Negative for dizziness, focal weakness and headaches.  Psychiatric/Behavioral: Negative.  The patient is not nervous/anxious.     As per HPI. Otherwise, a complete review of systems is negative.  PAST MEDICAL HISTORY: Past Medical History:  Diagnosis Date   Balance problem    INTERMITTENT WITH NEGATIVE WORKUP   COPD (chronic obstructive pulmonary disease) ([place])    Coronary  artery disease    Diabetes mellitus without complication ([facility])    GERD (gastroesophageal reflux disease)    Hypercholesteremia    Hypertension    Myocardial infarction ([place])    2005   Pneumonia    RECENT 5/17   Shortness of breath dyspnea    Wheezing     PAST SURGICAL HISTORY: Past Surgical History:  Procedure Laterality Date   APPENDECTOMY     CARDIAC CATHETERIZATION     CATARACT EXTRACTION W/PHACO Right 10/04/2015   Procedure: CATARACT EXTRACTION PHACO AND INTRAOCULAR LENS PLACEMENT ([place]);  Surgeon: [name], MD;  Location: [facility] ORS;  Service: Ophthalmology;  Laterality: Right;  [place] 02:06 AP% 17.9 CDE 22.65 fluid pack lot # [id number]   CATARACT EXTRACTION W/PHACO Left 12/13/2015   Procedure: CATARACT EXTRACTION PHACO AND INTRAOCULAR LENS PLACEMENT (IOC);  Surgeon: [name], MD;  Location: [facility];  Service: Ophthalmology;  Laterality: Left;  [place] 1.39 AP% 25.3 CDE 26.37 Fluid pack lot # [id number]   COLONOSCOPY     CORONARY ANGIOPLASTY     Stent placement   LAPAROTOMY N/A 03/30/2019   Procedure: EXPLORATORY LAPAROTOMY, POSSIBLE BOWEL RESECTION,;  Surgeon: Olean Ree, MD;  Location: [facility];  Service: General;  Laterality: N/A;   ORIF ANKLE FRACTURE Left 01/10/2018   Procedure: OPEN REDUCTION INTERNAL FIXATION (ORIF) ANKLE FRACTURE;  Surgeon: Dereck Leep, MD;  Location: [facility];  Service: Orthopedics;  Laterality: Left;    FAMILY HISTORY: History reviewed. No pertinent family history.  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  Substance Use Topics   Alcohol use: No    Comment: OCCAS   Drug use: Never     Colonoscopy:  PAP:  Bone density:  Lipid panel:  No Known Allergies  Current Facility-Administered Medications  Medication Dose Route Frequency Provider Last Rate Last Admin   acetaminophen (TYLENOL)  tablet 650 mg  650 mg Oral Q6H PRN Ivor Costa, MD   650 mg at 02/27/22 2131   albuterol (PROVENTIL) (2.5 MG/3ML) 0.083% nebulizer solution 2.5 mg  2.5 mg Inhalation Q4H PRN Ivor Costa, MD       aspirin EC tablet 81 mg  81 mg Oral Daily Ivor Costa, MD   81 mg at 03/02/22 2694   cholecalciferol (VITAMIN D3) 25 MCG (1000 UNIT) tablet 1,000 Units  1,000 Units Oral Daily Enzo Bi, MD   1,000 Units at 03/02/22 0842   dextromethorphan-guaiFENesin ([place] DM) 30-600 MG per 12 hr tablet 1 tablet  1 tablet Oral BID PRN Ivor Costa, MD       dextrose 50 % solution 0-50 mL  0-50 mL Intravenous PRN Ivor Costa, MD       enoxaparin (LOVENOX) injection 40 mg  40 mg Subcutaneous Q24H Enzo Bi, MD   40 mg at 85/46/27 0350   folic acid (FOLVITE) tablet 1 mg  1 mg Oral Daily Enzo Bi, MD   1 mg at 03/02/22 0843   hydrALAZINE (APRESOLINE) injection 5 mg  5 mg Intravenous Q2H PRN Ivor Costa, MD       insulin aspart (novoLOG) injection 0-15 Units  0-15 Units Subcutaneous TID WC Enzo Bi, MD   8 Units at 03/02/22 0843   insulin aspart (novoLOG) injection 3 Units  3 Units Subcutaneous TID WC Foust, Katy L, NP   3 Units at 03/02/22 0849   insulin detemir (LEVEMIR) injection 16 Units  0.3 Units/kg Subcutaneous Q24H Sharion Settler, NP   16 Units at 03/02/22 0843   lipase/protease/amylase (CREON) capsule 36,000 Units  36,000 Units Oral TID WC Lin Landsman, MD   36,000 Units at 03/02/22 0938   loperamide (IMODIUM) capsule 2 mg  2 mg Oral PRN Lin Landsman, MD       losartan (COZAAR) tablet 50 mg  50 mg Oral Daily Ivor Costa, MD       metoprolol succinate (TOPROL-XL) 24 hr tablet 50 mg  50 mg Oral Daily Ivor Costa, MD   50 mg at 03/02/22 1829   multivitamin with minerals tablet 1 tablet  1 tablet Oral Daily Enzo Bi, MD   1 tablet at 03/02/22 9371   nicotine (NICODERM CQ - dosed in mg/24 hours) patch 21 mg  21 mg Transdermal Daily Ivor Costa, MD       ondansetron [facility]) injection 4 mg  4 mg Intravenous Q8H  PRN Ivor Costa, MD       Oral care mouth rinse  15 mL Mouth Rinse PRN Ivor Costa, MD       pantoprazole (PROTONIX) EC tablet 40 mg  40 mg Oral BID AC Lin Landsman, MD   40 mg at 03/02/22 0842   sodium chloride 0.9 % bolus 1,000 mL  1,000 mL Intravenous Once Ivor Costa, MD       thiamine (VITAMIN B1) tablet 100 mg  100 mg Oral Daily Enzo Bi, MD   100 mg at 03/02/22 0842    OBJECTIVE: Vitals:   03/02/22 0452 03/02/22 0825  BP: (!) 99/35 (!) 110/59  Pulse: 96 99  Resp: 18 18  Temp: 98.4 F (36.9 C) 98.5 F (36.9 C)  SpO2: 95% 95%     Body mass index is 18.86 kg/m.    ECOG FS:1 - Symptomatic but completely ambulatory  General: Well-developed, well-nourished, no acute distress. Eyes: Pink conjunctiva, anicteric sclera. HEENT: Normocephalic, moist mucous membranes. Lungs: No audible wheezing or coughing. Heart: Regular rate and rhythm. Abdomen: Soft, nontender, no obvious distention. Musculoskeletal: No edema, cyanosis, or clubbing. Neuro: Alert, answering all questions appropriately. Cranial nerves grossly intact. Skin: No rashes or petechiae noted. Psych: Normal affect. Lymphatics: No cervical, calvicular, axillary or inguinal LAD.   LAB RESULTS:  Lab Results  Component Value Date   NA 135 03/02/2022   K 4.7 03/02/2022   CL 104 03/02/2022   CO2 26 03/02/2022   GLUCOSE 335 (H) 03/02/2022   BUN 21 03/02/2022   CREATININE 1.31 (H) 03/02/2022   CALCIUM 8.3 (L) 03/02/2022   PROT 6.6 02/27/2022   ALBUMIN 3.1 (L) 02/27/2022   AST 9 (L) 02/27/2022   ALT 9 02/27/2022   ALKPHOS 75 02/27/2022   BILITOT 0.8 02/27/2022   GFRNONAA 53 (L) 03/02/2022   GFRAA >60 04/15/2019    Lab Results  Component Value Date   WBC 9.8 03/02/2022   NEUTROABS 5.0 02/27/2022   HGB 9.8 (L) 03/02/2022   HCT 30.7 (L) 03/02/2022   MCV 94.5 03/02/2022   PLT 111 (L) 03/02/2022     STUDIES: CT CHEST ABDOMEN PELVIS W CONTRAST  Result Date: 03/01/2022 CLINICAL DATA:  Colon mass.   Looking for metastasis. EXAM: CT CHEST, ABDOMEN, AND PELVIS WITH CONTRAST TECHNIQUE: Multidetector CT imaging of the chest, abdomen and pelvis was performed following the standard protocol during bolus administration of intravenous contrast. RADIATION DOSE REDUCTION: This exam was performed according to the departmental dose-optimization program which includes automated exposure control, adjustment of the mA and/or kV according to patient size and/or use of iterative reconstruction technique. CONTRAST:  174m OMNIPAQUE IOHEXOL 300 MG/ML  SOLN COMPARISON:  04/02/2019 FINDINGS: CT CHEST FINDINGS Cardiovascular: Normal heart size. No pericardial effusions. Calcification of the aorta and coronary arteries. No aortic aneurysm or dissection. Great vessel origins are patent. Mediastinum/Nodes: Peripherally calcified low-attenuation lesion measuring 1.8 cm located posterior to the right thyroid and lateral to the esophagus. Likely representing an exophytic thyroid nodule. No change since prior study. Esophagus is decompressed but demonstrates residual gas and fluid suggesting reflux or dysmotility. Mild diffuse esophageal wall thickening suggesting reflux disease or esophagitis. No lymphadenopathy. Lungs/Pleura: Emphysematous changes in the lungs. Several focal areas of subpleural scarring and pleural thickening. No focal pulmonary nodule or consolidation. Minimal bilateral pleural effusions with mild bilateral basilar atelectasis. Musculoskeletal: Old bilateral rib fractures. No focal bone lesion or bone destruction. CT ABDOMEN PELVIS FINDINGS Hepatobiliary: No focal liver lesions. Mild gallbladder wall thickening and edema. No stones identified. No bile duct dilatation. Pancreas: Unremarkable. No pancreatic ductal dilatation or surrounding inflammatory changes. Spleen: Normal in size without focal abnormality. Adrenals/Urinary Tract: No adrenal gland nodules. Kidneys are symmetrical with small subcentimeter cysts present.  No imaging follow-up is indicated. No hydronephrosis. Bladder is moderately distended, possibly physiologic or outlet obstruction. No bladder wall thickening or stones. Small bladder diverticulum at the dome of the bladder. Stomach/Bowel: Stomach, small bowel, and colon are not abnormally distended. Contrast material flows to the descending colon suggesting no evidence of obstruction. Anastomosis at the rectosigmoid junction appears patent. Appendix is not identified. Vascular/Lymphatic: Aortic atherosclerosis. No enlarged abdominal or pelvic lymph nodes. Reproductive: Uterus and  bilateral adnexa are unremarkable. Other: No abdominal wall hernia or abnormality. No abdominopelvic ascites. Musculoskeletal: Degenerative changes. No focal bone lesions are identified. IMPRESSION: 1. No evidence of metastatic disease in the chest, abdomen, or pelvis. 2. Stable appearance of peripherally calcified 1.8 cm right thyroid lesion. This has been evaluated on previous imaging. (ref: J Am Coll Radiol. 2015 Feb;12(2): 143-50). 3. Mild bladder wall thickening is nonspecific, possibly physiologic, inflammatory or related to liver disease. No stones. 4. Distended bladder with bladder diverticula. 5. Aortic atherosclerosis. 6. Emphysematous changes in the lungs. Minimal bilateral pleural effusions and basilar atelectasis. Electronically Signed   By: Lucienne Capers M.D.   On: 03/01/2022 19:13   DG Chest 2 View  Result Date: 02/27/2022 CLINICAL DATA:  Weight loss, hyponatremia EXAM: CHEST - 2 VIEW COMPARISON:  04/15/2019 FINDINGS: Normal cardiac and mediastinal contours. Aortic atherosclerosis. No focal pulmonary opacity. Trace pleural effusions versus pleural scarring. No pneumothorax. Remote right lower rib fractures. No acute osseous abnormality. IMPRESSION: Trace pleural effusions versus pleural scarring. Electronically Signed   By: Merilyn Baba M.D.   On: 02/27/2022 12:21    ASSESSMENT: Colon cancer  PLAN:    Colon  cancer: Colonoscopy noted mass in colon highly suspicious for underlying malignancy.  Pathology is pending at time of dictation.  CT scan results reviewed independently and report as above with no obvious evidence of metastatic disease.  A CEA has been ordered for completeness and is pending at time of dictation.  Case discussed with surgery and with hospitalist.  No further intervention needed at this time.  Patient will be set up for surgical resection in the next several weeks.  We will arrange follow-up in the cancer center 1 to 2 weeks after his surgery to discuss the final pathology results and any additional treatment necessary.  Given patient's age and multiple comorbidities, the use of chemotherapy will be unlikely. Anemia: Likely secondary to underlying malignancy.  Patient's hemoglobin is 9.8, monitor. Hyperglycemia: Patient continues to have poor blood glucose control. Renal insufficiency: Mild.  Patient's most recent creatinine is 1.31.  Appreciate consult, will follow.    Lloyd Huger, MD   03/02/2022 1:11 PM

## 2022-03-02 NOTE — TOC CM/SW Note (Signed)
Patient is not able to walk the distance required to go the bathroom, or he/she is unable to safely negotiate stairs required to access the bathroom.  A 3in1 BSC will alleviate this problem  

## 2022-03-02 NOTE — Discharge Summary (Signed)
Physician Discharge Summary   Micheal [name]  male DOB: 1932-08-01  LOV:[id number]  PCP: Tennis Must, MD  Admit date: 02/27/2022 Discharge date: 03/02/2022  Admitted From: home Disposition:  home CODE STATUS: Full code  Discharge Instructions     Diet Carb Modified   Complete by: As directed    Discharge instructions   Complete by: As directed    You have damage to the lining of your stomach, so please take protonix 40 mg twice a day for 3 months, and follow up with GI Dr. Marius Ditch in 2 months.  You have a mass in your colon.  You will follow up with surgeon Dr. Lysle Pearl as outpatient, and oncology Dr. Grayland Ormond.  Your diabetes is uncontrolled.  Please continue your Trijardy.  I have added insulin Lantus 16 units daily, and oral glipizide 2.5 mg twice a day to better control your blood sugars.     Dr. Enzo Bi [facility] Course:  For full details, please see H&P, progress notes, consult notes and ancillary notes.  Briefly,  Micheal [name] is a 88 y.o. male with medical history significant of hypertension, diabetes mellitus, COPD, GERD, CAD, tobacco abuse, alcohol abuse (has cut down on drinking recently), CKD 3B, small bowel obstruction, who presented with diarrhea, weight loss, increased urinary frequency.   * Hyperosmolar hyperglycemic state (HHS) (HCC) Blood sugar 630, bicarb 20, anion gap normal.  Beta hydroxybutyric acid 0.35, osm 316. --received insulin gtt and IVF   DM (diabetes mellitus), type 2, uncontrolled, with hyperosmolarity (HCC) --A1c 10.8.  Was not on insulin PTA. --was on Levemir 16u daily and SSI during hospitalization. --discharged on Lantus 16u daily, oral glipizide 2.5 mg BID.   --cont home Trijardy   Diarrhea chronic Patient reported loose stool and incontinence for the past 6 months.  No nausea, vomiting, abdominal pain.  Patient has a significant weight loss.  -Check C. difficile, GI pathogen panel neg --colonoscopy found tumor at 35 cm  proximal to the anus.  Colonic mass --CT c/a/p w contrast found no mets.  Oncology and GenSurg consulted.  Pt will follow up with Dr. Lysle Pearl outpatient for possible surgical resection, and then follow back with Dr. Grayland Ormond.  Erosive gastropathy --EGD found Erosive gastropathy with stigmata of recent bleeding. --PPI BID for 3 months and follow up with GI Dr. Marius Ditch in 2 months.  UTI 2/2 Klebsiella  --completed 3 days of ceftriaxone   CAD (coronary artery disease) No chest pain -Continue aspirin, Lipitor   Chronic kidney disease, stage 3b (HCC) Renal function slightly worsened than baseline.  Recent baseline creatinine 1.2-1.6.   COPD (chronic obstructive pulmonary disease) ([place]) Ruled out exacerbation --prednisone started on admission, d/c'ed    HLD (hyperlipidemia) - Lipitor   Hyperkalemia, resolved -Patient was given 1 g calcium gluconate, 10g Lokelma.    Thrombocytopenia (HCC) Etiology is not clear.  May be related to his alcohol abuse history.  Plt 111 prior to discharge.   HTN (hypertension) -Metoprolol, Cozaar   Alcohol abuse Patient states he drinks very little recently.  No signs of alcohol withdrawal.   Tobacco abuse - Nicotine patch  Protein-calorie malnutrition, severe --supplements per dietitian   Discharge Diagnoses:  Principal Problem:   Hyperosmolar hyperglycemic state (HHS) ([place]) Active Problems:   DM (diabetes mellitus), type 2, uncontrolled, with hyperosmolarity ([place])   Diarrhea   UTI (urinary tract infection)   CAD (coronary artery disease)   Chronic kidney disease, stage 3b ([place])  COPD (chronic obstructive pulmonary disease) (HCC)   HLD (hyperlipidemia)   Hyperkalemia   Thrombocytopenia (HCC)   HTN (hypertension)   Alcohol abuse   Tobacco abuse   Chronic diarrhea of unknown origin   Protein-calorie malnutrition, severe   Unexplained weight loss   Erosive gastritis   Colonic mass   Erosive gastropathy   30 Day Unplanned  Readmission Risk Score    Flowsheet Row ED to Hosp-Admission (Current) from 02/27/2022 in [place]  30 Day Unplanned Readmission Risk Score (%) 14.95 Filed at 03/02/2022 1200       This score is the patient's risk of an unplanned readmission within 30 days of being discharged (0 -100%). The score is based on dignosis, age, lab data, medications, orders, and past utilization.   Low:  0-14.9   Medium: 15-21.9   High: 22-29.9   Extreme: 30 and above         Discharge Instructions:  Allergies as of 03/02/2022   No Known Allergies      Medication List     TAKE these medications    albuterol 108 (90 Base) MCG/ACT inhaler Commonly known as: VENTOLIN HFA Inhale 2 puffs into the lungs every 6 (six) hours as needed for wheezing or shortness of breath.   albuterol (2.5 MG/3ML) 0.083% nebulizer solution Commonly known as: PROVENTIL Inhale 3 mLs into the lungs every 4 (four) hours as needed for wheezing or shortness of breath.   aspirin EC 81 MG tablet Take 81 mg by mouth daily.   atorvastatin 40 MG tablet Commonly known as: LIPITOR Take 40 mg by mouth daily.   blood glucose meter kit and supplies Dispense based on patient and insurance preference. Use up to four times daily as directed. (FOR ICD-10 E10.9, E11.9).   folic acid 1 MG tablet Commonly known as: FOLVITE Take 1 tablet (1 mg total) by mouth daily. Start taking on: March 03, 2022   glipiZIDE 5 MG tablet Commonly known as: Glucotrol Take 0.5 tablets (2.5 mg total) by mouth 2 (two) times daily before a meal.   Insulin Pen Needle 32G X 4 MM Misc 1 Units by Does not apply route daily.   ipratropium-albuterol 0.5-2.5 (3) MG/3ML Soln Commonly known as: DUONEB Inhale 3 mLs into the lungs every 6 (six) hours as needed for wheezing.   Lantus SoloStar 100 UNIT/ML Solostar Pen Generic drug: insulin glargine Inject 16 Units into the skin daily.   lipase/protease/amylase  36000 UNITS Cpep capsule Commonly known as: CREON Take 1 capsule (36,000 Units total) by mouth 3 (three) times daily with meals.   losartan 50 MG tablet Commonly known as: COZAAR Take 50 mg by mouth daily.   metoprolol succinate 50 MG 24 hr tablet Commonly known as: TOPROL-XL Take 50 mg by mouth daily.   multivitamin with minerals Tabs tablet Take 1 tablet by mouth daily. Start taking on: March 03, 2022   nicotine 21 mg/24hr patch Commonly known as: NICODERM CQ - dosed in mg/24 hours Place 1 patch (21 mg total) onto the skin daily. Start taking on: March 03, 2022   pantoprazole 40 MG tablet Commonly known as: PROTONIX Take 1 tablet (40 mg total) by mouth 2 (two) times daily before a meal.   thiamine 100 MG tablet Commonly known as: VITAMIN B1 Take 1 tablet (100 mg total) by mouth daily. Start taking on: March 03, 2022   triamcinolone ointment 0.1 % Commonly known as: KENALOG Apply topically 2 (two) times daily.  Trijardy XR 25-09-998 MG Tb24 Generic drug: Empagliflozin-Linaglip-Metform Take 1 tablet by mouth daily.   vitamin D3 25 MCG tablet Commonly known as: CHOLECALCIFEROL Take 1 tablet (1,000 Units total) by mouth daily. Start taking on: March 03, 2022               Durable Medical Equipment  (From admission, onward)           Start     Ordered   02/28/22 1329  For home use only DME 3 n 1  Once        02/28/22 1328             Follow-up Information     D'Lo, Isami, DO Follow up.   Specialty: Surgery Contact information: [address] [place] [address] [phone]         Lloyd Huger, MD Follow up.   Specialty: Oncology Contact information: [place] [address] [phone]         Lin Landsman, MD Follow up in 2 month(s).   Specialty: Gastroenterology Contact information: [place] [address] [phone]         Tennis Must, MD Follow up in  1 week(s).   Specialty: Internal Medicine Contact information: [address] [place] [address] [phone]                 No Known Allergies   The results of significant diagnostics from this hospitalization (including imaging, microbiology, ancillary and laboratory) are listed below for reference.   Consultations:   Procedures/Studies: CT CHEST ABDOMEN PELVIS W CONTRAST  Result Date: 03/01/2022 CLINICAL DATA:  Colon mass.  Looking for metastasis. EXAM: CT CHEST, ABDOMEN, AND PELVIS WITH CONTRAST TECHNIQUE: Multidetector CT imaging of the chest, abdomen and pelvis was performed following the standard protocol during bolus administration of intravenous contrast. RADIATION DOSE REDUCTION: This exam was performed according to the departmental dose-optimization program which includes automated exposure control, adjustment of the mA and/or kV according to patient size and/or use of iterative reconstruction technique. CONTRAST:  133m OMNIPAQUE IOHEXOL 300 MG/ML  SOLN COMPARISON:  04/02/2019 FINDINGS: CT CHEST FINDINGS Cardiovascular: Normal heart size. No pericardial effusions. Calcification of the aorta and coronary arteries. No aortic aneurysm or dissection. Great vessel origins are patent. Mediastinum/Nodes: Peripherally calcified low-attenuation lesion measuring 1.8 cm located posterior to the right thyroid and lateral to the esophagus. Likely representing an exophytic thyroid nodule. No change since prior study. Esophagus is decompressed but demonstrates residual gas and fluid suggesting reflux or dysmotility. Mild diffuse esophageal wall thickening suggesting reflux disease or esophagitis. No lymphadenopathy. Lungs/Pleura: Emphysematous changes in the lungs. Several focal areas of subpleural scarring and pleural thickening. No focal pulmonary nodule or consolidation. Minimal bilateral pleural effusions with mild bilateral basilar atelectasis. Musculoskeletal: Old bilateral rib  fractures. No focal bone lesion or bone destruction. CT ABDOMEN PELVIS FINDINGS Hepatobiliary: No focal liver lesions. Mild gallbladder wall thickening and edema. No stones identified. No bile duct dilatation. Pancreas: Unremarkable. No pancreatic ductal dilatation or surrounding inflammatory changes. Spleen: Normal in size without focal abnormality. Adrenals/Urinary Tract: No adrenal gland nodules. Kidneys are symmetrical with small subcentimeter cysts present. No imaging follow-up is indicated. No hydronephrosis. Bladder is moderately distended, possibly physiologic or outlet obstruction. No bladder wall thickening or stones. Small bladder diverticulum at the dome of the bladder. Stomach/Bowel: Stomach, small bowel, and colon are not abnormally distended. Contrast material flows to the descending colon suggesting no evidence of  obstruction. Anastomosis at the rectosigmoid junction appears patent. Appendix is not identified. Vascular/Lymphatic: Aortic atherosclerosis. No enlarged abdominal or pelvic lymph nodes. Reproductive: Uterus and bilateral adnexa are unremarkable. Other: No abdominal wall hernia or abnormality. No abdominopelvic ascites. Musculoskeletal: Degenerative changes. No focal bone lesions are identified. IMPRESSION: 1. No evidence of metastatic disease in the chest, abdomen, or pelvis. 2. Stable appearance of peripherally calcified 1.8 cm right thyroid lesion. This has been evaluated on previous imaging. (ref: J Am Coll Radiol. 2015 Feb;12(2): 143-50). 3. Mild bladder wall thickening is nonspecific, possibly physiologic, inflammatory or related to liver disease. No stones. 4. Distended bladder with bladder diverticula. 5. Aortic atherosclerosis. 6. Emphysematous changes in the lungs. Minimal bilateral pleural effusions and basilar atelectasis. Electronically Signed   By: Lucienne Capers M.D.   On: 03/01/2022 19:13   DG Chest 2 View  Result Date: 02/27/2022 CLINICAL DATA:  Weight loss,  hyponatremia EXAM: CHEST - 2 VIEW COMPARISON:  04/15/2019 FINDINGS: Normal cardiac and mediastinal contours. Aortic atherosclerosis. No focal pulmonary opacity. Trace pleural effusions versus pleural scarring. No pneumothorax. Remote right lower rib fractures. No acute osseous abnormality. IMPRESSION: Trace pleural effusions versus pleural scarring. Electronically Signed   By: Merilyn Baba M.D.   On: 02/27/2022 12:21      Labs: BNP (last 3 results) Recent Labs    02/27/22 1229  BNP 979.8*   Basic Metabolic Panel: Recent Labs  Lab 02/28/22 0138 02/28/22 0641 02/28/22 1001 02/28/22 1255 02/28/22 2158 03/01/22 0535 03/02/22 0551  NA 137 138 135  --   --  138 135  K 4.8 5.6* 4.3  --   --  4.4 4.7  CL 107 106 103  --   --  106 104  CO2 25 26 24   --   --  24 26  GLUCOSE 158* 146* 152*  --  370* 121* 335*  BUN 35* 34* 31*  --   --  23 21  CREATININE 1.38* 1.38* 1.23  --   --  1.02 1.31*  CALCIUM 8.8* 8.9 8.6*  --   --  8.4* 8.3*  MG  --   --   --  1.7  --  1.9 1.8  PHOS  --   --   --  2.4*  --  2.9  --    Liver Function Tests: Recent Labs  Lab 02/27/22 1043  AST 9*  ALT 9  ALKPHOS 75  BILITOT 0.8  PROT 6.6  ALBUMIN 3.1*   No results for input(s): "LIPASE", "AMYLASE" in the last 168 hours. No results for input(s): "AMMONIA" in the last 168 hours. CBC: Recent Labs  Lab 02/27/22 1240 02/28/22 0138 03/01/22 0535 03/02/22 0551  WBC 7.6 10.5 12.1* 9.8  NEUTROABS 5.0  --   --   --   HGB 10.6* 10.5* 10.3* 9.8*  HCT 33.4* 32.2* 31.8* 30.7*  MCV 93.6 91.2 93.3 94.5  PLT 97* 90* 110* 111*   Cardiac Enzymes: No results for input(s): "CKTOTAL", "CKMB", "CKMBINDEX", "TROPONINI" in the last 168 hours. BNP: Invalid input(s): "POCBNP" CBG: Recent Labs  Lab 03/01/22 1525 03/01/22 2156 03/02/22 0832 03/02/22 1217 03/02/22 1240  GLUCAP 170* 227* 278* 444* 414*   D-Dimer No results for input(s): "DDIMER" in the last 72 hours. Hgb A1c Recent Labs    02/27/22 1412   HGBA1C 10.8*   Lipid Profile No results for input(s): "CHOL", "HDL", "LDLCALC", "TRIG", "CHOLHDL", "LDLDIRECT" in the last 72 hours. Thyroid function studies Recent Labs    02/28/22  0138  TSH 0.380*  T4TOTAL 6.0   Anemia work up Recent Labs    02/28/22 1255 03/01/22 0535  VITAMINB12 464  --   FOLATE 10.1  --   FERRITIN  --  279  TIBC  --  210*  IRON  --  67   Urinalysis    Component Value Date/Time   COLORURINE YELLOW (A) 02/27/2022 1044   APPEARANCEUR TURBID (A) 02/27/2022 1044   LABSPEC 1.020 02/27/2022 1044   PHURINE 5.0 02/27/2022 1044   GLUCOSEU >=500 (A) 02/27/2022 1044   HGBUR NEGATIVE 02/27/2022 1044   BILIRUBINUR NEGATIVE 02/27/2022 [place] 02/27/2022 1044   PROTEINUR 30 (A) 02/27/2022 1044   NITRITE NEGATIVE 02/27/2022 1044   LEUKOCYTESUR LARGE (A) 02/27/2022 1044   Sepsis Labs Recent Labs  Lab 02/27/22 1240 02/28/22 0138 03/01/22 0535 03/02/22 0551  WBC 7.6 10.5 12.1* 9.8   Microbiology Recent Results (from the past 240 hour(s))  Urine Culture     Status: Abnormal   Collection Time: 02/27/22 10:44 AM   Specimen: Urine, Clean Catch  Result Value Ref Range Status   Specimen Description   Final    URINE, CLEAN CATCH Performed at [facility], [address] [place], [place] [address]    Special Requests   Final    NONE Performed at [facility], [address], [place], [place] [address]    Culture >=100,000 COLONIES/mL KLEBSIELLA PNEUMONIAE (A)  Final   Report Status 03/01/2022 FINAL  Final   Organism ID, Bacteria KLEBSIELLA PNEUMONIAE (A)  Final      Susceptibility   Klebsiella pneumoniae - MIC*    AMPICILLIN >=32 RESISTANT Resistant     CEFAZOLIN <=4 SENSITIVE Sensitive     CEFEPIME <=0.12 SENSITIVE Sensitive     CEFTRIAXONE <=0.25 SENSITIVE Sensitive     CIPROFLOXACIN <=0.25 SENSITIVE Sensitive     GENTAMICIN <=1 SENSITIVE Sensitive     IMIPENEM <=0.25 SENSITIVE Sensitive     NITROFURANTOIN 32  SENSITIVE Sensitive     TRIMETH/SULFA <=20 SENSITIVE Sensitive     AMPICILLIN/SULBACTAM 4 SENSITIVE Sensitive     PIP/TAZO <=4 SENSITIVE Sensitive     * >=100,000 COLONIES/mL KLEBSIELLA PNEUMONIAE  Blood culture (routine x 2)     Status: None (Preliminary result)   Collection Time: 02/27/22 12:40 PM   Specimen: BLOOD  Result Value Ref Range Status   Specimen Description BLOOD LEFT AC  Final   Special Requests   Final    BOTTLES DRAWN AEROBIC AND ANAEROBIC Blood Culture adequate volume   Culture   Final    NO GROWTH 3 DAYS Performed at [facility], [address], [place], [place] [address]    Report Status PENDING  Incomplete  Blood culture (routine x 2)     Status: None (Preliminary result)   Collection Time: 02/27/22  2:58 PM   Specimen: BLOOD  Result Value Ref Range Status   Specimen Description BLOOD BLOOD LEFT FOREARM  Final   Special Requests   Final    BOTTLES DRAWN AEROBIC AND ANAEROBIC Blood Culture results may not be optimal due to an inadequate volume of blood received in culture bottles   Culture   Final    NO GROWTH 3 DAYS Performed at [facility], [address] [place], [place] [address]    Report Status PENDING  Incomplete  C Difficile Quick Screen w PCR reflex     Status: None   Collection Time: 02/27/22  5:05 PM  Specimen: STOOL  Result Value Ref Range Status   C Diff antigen NEGATIVE NEGATIVE Final   C Diff toxin NEGATIVE NEGATIVE Final   C Diff interpretation No C. difficile detected.  Final    Comment: Performed at [facility], [address] [place], [place] [address]  Gastrointestinal Panel by PCR , Stool     Status: None   Collection Time: 02/27/22  5:05 PM   Specimen: Stool  Result Value Ref Range Status   Campylobacter species NOT DETECTED NOT DETECTED Final   Plesimonas shigelloides NOT DETECTED NOT DETECTED Final   Salmonella species NOT DETECTED NOT DETECTED Final   Yersinia enterocolitica NOT DETECTED NOT  DETECTED Final   Vibrio species NOT DETECTED NOT DETECTED Final   Vibrio cholerae NOT DETECTED NOT DETECTED Final   Enteroaggregative E coli (EAEC) NOT DETECTED NOT DETECTED Final   Enteropathogenic E coli (EPEC) NOT DETECTED NOT DETECTED Final   Enterotoxigenic E coli (ETEC) NOT DETECTED NOT DETECTED Final   Shiga like toxin producing E coli (STEC) NOT DETECTED NOT DETECTED Final   Shigella/Enteroinvasive E coli (EIEC) NOT DETECTED NOT DETECTED Final   Cryptosporidium NOT DETECTED NOT DETECTED Final   Cyclospora cayetanensis NOT DETECTED NOT DETECTED Final   Entamoeba histolytica NOT DETECTED NOT DETECTED Final   Giardia lamblia NOT DETECTED NOT DETECTED Final   Adenovirus F40/41 NOT DETECTED NOT DETECTED Final   Astrovirus NOT DETECTED NOT DETECTED Final   Norovirus GI/GII NOT DETECTED NOT DETECTED Final   Rotavirus A NOT DETECTED NOT DETECTED Final   Sapovirus (I, II, IV, and V) NOT DETECTED NOT DETECTED Final    Comment: Performed at [facility], [address] [place], [place] [address]  MRSA Next Gen by PCR, Nasal     Status: None   Collection Time: 02/27/22  5:08 PM   Specimen: Nasal Mucosa; Nasal Swab  Result Value Ref Range Status   MRSA by PCR Next Gen NOT DETECTED NOT DETECTED Final    Comment: (NOTE) The GeneXpert MRSA Assay (FDA approved for NASAL specimens only), is one component of a comprehensive MRSA colonization surveillance program. It is not intended to diagnose MRSA infection nor to guide or monitor treatment for MRSA infections. Test performance is not FDA approved in patients less than 77 years old. Performed at [facility], [place] [place],  [address]      Total time spend on discharging this patient, including the last patient exam, discussing the hospital stay, instructions for ongoing care as it relates to all pertinent caregivers, as well as preparing the medical discharge records, prescriptions, and/or referrals as  applicable, is 50 minutes.    Enzo Bi, MD  [facility] 03/02/2022, 1:53 PM

## 2022-03-02 NOTE — TOC Transition Note (Addendum)
Transition of Care [facility]) - CM/SW Discharge Note   Patient Details  Name: Micheal [name] MRN: [id number] Date of Birth: [birth year]  [facility] CM/SW Contact:  Colen Darling, [place] Phone Number: 03/02/2022, 2:40 PM   Clinical Narrative:     The patient will discharge home with [place] and [place] with [facility]. [employer] is delivering BSC to the room. Family will provide transport home.  Final next level of care: [place]     Patient Goals and CMS Choice Patient states their goals for this hospitalization and ongoing recovery are:: to return home CMS Medicare.gov Compare Post Acute Care list provided to:: Patient Choice offered to / list presented to : Patient  Discharge Placement                 [facility]PT and [place]      Discharge Plan and Services                DME Arranged: 3-N-1   Date DME Agency Contacted: 03/02/22 Time DME Agency Contacted: [phone] Representative spoke with at DME Agency: Thedore Mins HH Arranged: PT, OT HH Agency: [place] Date [place]: 03/02/22 Time [place]: 75 Representative spoke with at [place]: [place]) Interventions     Readmission Risk Interventions     No data to display

## 2022-03-03 LAB — VITAMIN B1: Vitamin B1 (Thiamine): 134.9 nmol/L (ref 66.5–200.0)

## 2022-03-03 LAB — CEA: CEA: 3.7 ng/mL (ref 0.0–4.7)

## 2022-03-04 LAB — CULTURE, BLOOD (ROUTINE X 2)
Culture: NO GROWTH
Culture: NO GROWTH
Special Requests: ADEQUATE

## 2022-03-05 LAB — SURGICAL PATHOLOGY

## 2022-03-06 LAB — PANCREATIC ELASTASE, FECAL: Pancreatic Elastase-1, Stool: <50 ug Elast./g — ABNORMAL LOW (ref >200)

## 2022-03-07 ENCOUNTER — Telehealth: Payer: Self-pay

## 2022-03-07 NOTE — Telephone Encounter (Signed)
-----   Message from Lin Landsman, MD sent at 03/07/2022  8:02 AM EDT ----- Caryl Pina  Please inform patient that his fecal elastase levels are very low suggestive of pancreatic insufficiency. Increase creon to 72K with first bite of each meal and 1 with snack which is long term. I will see him for follow up as scheduled   Rohini Vanga

## 2022-03-07 NOTE — Telephone Encounter (Signed)
Called and left a message for call back  

## 2022-03-07 NOTE — Telephone Encounter (Signed)
Pended the medication. Called and left a message for call back

## 2022-03-08 LAB — MISC LABCORP TEST (SEND OUT): Labcorp test code: 70115

## 2022-03-08 LAB — ZINC: Zinc: 59 ug/dL (ref 44–115)

## 2022-03-08 LAB — COPPER, SERUM: Copper: 139 ug/dL — ABNORMAL HIGH (ref 69–132)

## 2022-03-12 ENCOUNTER — Telehealth: Payer: Self-pay

## 2022-03-12 NOTE — Telephone Encounter (Signed)
Patient has a diagnosis of severe EPI.  We are trying to reach out to him to start him on enzyme replacement therapy.  I am not able to send a message to his PCP Dr. Nyra Capes.  Wondering if your office might be able to reach out to him  Caryl Pina  Please call his PCPs office and fax them a copy of the results   Sherri Sear, MD

## 2022-03-12 NOTE — Telephone Encounter (Signed)
Called and left a message for call back. Called emergency contact and left a message for call back  Unable to reach patient do you want me to send letter

## 2022-03-12 NOTE — Telephone Encounter (Signed)
Called Provider office and they took a telephone call to send to the provider.

## 2022-03-12 NOTE — Telephone Encounter (Signed)
Follow up on inpatient consultation with Dr. Grayland Ormond for colon cancer. Dr. Grayland Ormond will see 2 weeks after surgery. Surgery is pending scheduling with Dr. Lysle Pearl after clearance obtained.

## 2022-03-15 MED ORDER — PANCRELIPASE (LIP-PROT-AMYL) 36000-114000 UNITS PO CPEP: ORAL_CAPSULE | ORAL | 5 refills | Status: DC

## 2022-03-15 NOTE — Addendum Note (Signed)
Addended by: Ulyess Blossom L on: 03/15/2022 09:53 AM   Modules accepted: Orders

## 2022-03-15 NOTE — Telephone Encounter (Signed)
Patient called back and verbalized understanding he states his daughter will call today to get the information to and we can give her the results. Sent prescription to the pharmacy [facility] [address]

## 2022-03-15 NOTE — Addendum Note (Signed)
Addended by: Ulyess Blossom L on: 03/15/2022 01:05 PM   Modules accepted: Orders

## 2022-03-15 NOTE — Telephone Encounter (Signed)
Patient daughter called and verbalized understanding of results. She wants medication sent to [facility]

## 2022-03-23 NOTE — Progress Notes (Signed)
Follow up on inpatient consultation with Dr. Grayland Ormond for colon cancer. Dr. Grayland Ormond will see 2 weeks after surgery. Surgery is pending scheduling with Dr. Lysle Pearl after clearance obtained. Continue to await surgery scheduling.

## 2022-03-30 ENCOUNTER — Ambulatory Visit: Payer: Self-pay | Admitting: Surgery

## 2022-04-02 ENCOUNTER — Inpatient Hospital Stay: Admit: 2022-04-02 | Payer: Medicare PPO | Admitting: Surgery

## 2022-04-02 ENCOUNTER — Encounter (HOSPITAL_COMMUNITY): Payer: Self-pay

## 2022-04-02 ENCOUNTER — Inpatient Hospital Stay: Admission: RE | Admit: 2022-04-02 | Payer: Medicare PPO | Source: Ambulatory Visit

## 2022-04-02 ENCOUNTER — Inpatient Hospital Stay
Admission: RE | Admit: 2022-04-02 | Discharge: 2022-04-13 | DRG: 329 | Disposition: A | Discharge status: Hospice | Payer: Medicare PPO | Source: Ambulatory Visit | Attending: Internal Medicine | Admitting: Internal Medicine

## 2022-04-02 ENCOUNTER — Encounter: Payer: Self-pay | Admitting: Surgery

## 2022-04-02 DIAGNOSIS — F1010 Alcohol abuse, uncomplicated: Secondary | ICD-10-CM | POA: Diagnosis present

## 2022-04-02 DIAGNOSIS — Z66 Do not resuscitate: Secondary | ICD-10-CM | POA: Diagnosis not present

## 2022-04-02 DIAGNOSIS — I10 Essential (primary) hypertension: Secondary | ICD-10-CM | POA: Diagnosis present

## 2022-04-02 DIAGNOSIS — I252 Old myocardial infarction: Secondary | ICD-10-CM

## 2022-04-02 DIAGNOSIS — K659 Peritonitis, unspecified: Secondary | ICD-10-CM | POA: Diagnosis not present

## 2022-04-02 DIAGNOSIS — A419 Sepsis, unspecified organism: Secondary | ICD-10-CM | POA: Diagnosis not present

## 2022-04-02 DIAGNOSIS — I5031 Acute diastolic (congestive) heart failure: Secondary | ICD-10-CM | POA: Diagnosis not present

## 2022-04-02 DIAGNOSIS — R339 Retention of urine, unspecified: Secondary | ICD-10-CM | POA: Diagnosis not present

## 2022-04-02 DIAGNOSIS — Z7982 Long term (current) use of aspirin: Secondary | ICD-10-CM

## 2022-04-02 DIAGNOSIS — Z86010 Personal history of colonic polyps: Secondary | ICD-10-CM

## 2022-04-02 DIAGNOSIS — K567 Ileus, unspecified: Secondary | ICD-10-CM | POA: Diagnosis not present

## 2022-04-02 DIAGNOSIS — I509 Heart failure, unspecified: Secondary | ICD-10-CM | POA: Diagnosis not present

## 2022-04-02 DIAGNOSIS — K660 Peritoneal adhesions (postprocedural) (postinfection): Secondary | ICD-10-CM | POA: Diagnosis present

## 2022-04-02 DIAGNOSIS — E11649 Type 2 diabetes mellitus with hypoglycemia without coma: Secondary | ICD-10-CM | POA: Diagnosis not present

## 2022-04-02 DIAGNOSIS — K6389 Other specified diseases of intestine: Secondary | ICD-10-CM | POA: Diagnosis not present

## 2022-04-02 DIAGNOSIS — R54 Age-related physical debility: Secondary | ICD-10-CM | POA: Diagnosis present

## 2022-04-02 DIAGNOSIS — E119 Type 2 diabetes mellitus without complications: Secondary | ICD-10-CM | POA: Diagnosis not present

## 2022-04-02 DIAGNOSIS — F17210 Nicotine dependence, cigarettes, uncomplicated: Secondary | ICD-10-CM | POA: Diagnosis present

## 2022-04-02 DIAGNOSIS — I2510 Atherosclerotic heart disease of native coronary artery without angina pectoris: Secondary | ICD-10-CM | POA: Diagnosis present

## 2022-04-02 DIAGNOSIS — I130 Hypertensive heart and chronic kidney disease with heart failure and stage 1 through stage 4 chronic kidney disease, or unspecified chronic kidney disease: Secondary | ICD-10-CM | POA: Diagnosis present

## 2022-04-02 DIAGNOSIS — E1100 Type 2 diabetes mellitus with hyperosmolarity without nonketotic hyperglycemic-hyperosmolar coma (NKHHC): Secondary | ICD-10-CM | POA: Diagnosis present

## 2022-04-02 DIAGNOSIS — K9189 Other postprocedural complications and disorders of digestive system: Secondary | ICD-10-CM | POA: Diagnosis not present

## 2022-04-02 DIAGNOSIS — D696 Thrombocytopenia, unspecified: Secondary | ICD-10-CM | POA: Diagnosis present

## 2022-04-02 DIAGNOSIS — Z8249 Family history of ischemic heart disease and other diseases of the circulatory system: Secondary | ICD-10-CM

## 2022-04-02 DIAGNOSIS — C187 Malignant neoplasm of sigmoid colon: Principal | ICD-10-CM | POA: Diagnosis present

## 2022-04-02 DIAGNOSIS — E118 Type 2 diabetes mellitus with unspecified complications: Secondary | ICD-10-CM | POA: Diagnosis not present

## 2022-04-02 DIAGNOSIS — E7800 Pure hypercholesterolemia, unspecified: Secondary | ICD-10-CM | POA: Diagnosis present

## 2022-04-02 DIAGNOSIS — E872 Acidosis, unspecified: Secondary | ICD-10-CM | POA: Diagnosis present

## 2022-04-02 DIAGNOSIS — N1832 Chronic kidney disease, stage 3b: Secondary | ICD-10-CM | POA: Diagnosis present

## 2022-04-02 DIAGNOSIS — R6520 Severe sepsis without septic shock: Secondary | ICD-10-CM | POA: Diagnosis not present

## 2022-04-02 DIAGNOSIS — I959 Hypotension, unspecified: Secondary | ICD-10-CM | POA: Diagnosis not present

## 2022-04-02 DIAGNOSIS — I9589 Other hypotension: Secondary | ICD-10-CM | POA: Diagnosis not present

## 2022-04-02 DIAGNOSIS — N179 Acute kidney failure, unspecified: Secondary | ICD-10-CM | POA: Diagnosis present

## 2022-04-02 DIAGNOSIS — J449 Chronic obstructive pulmonary disease, unspecified: Secondary | ICD-10-CM | POA: Diagnosis present

## 2022-04-02 DIAGNOSIS — E1165 Type 2 diabetes mellitus with hyperglycemia: Secondary | ICD-10-CM | POA: Diagnosis present

## 2022-04-02 DIAGNOSIS — Y832 Surgical operation with anastomosis, bypass or graft as the cause of abnormal reaction of the patient, or of later complication, without mention of misadventure at the time of the procedure: Secondary | ICD-10-CM | POA: Diagnosis not present

## 2022-04-02 DIAGNOSIS — N1831 Chronic kidney disease, stage 3a: Secondary | ICD-10-CM | POA: Diagnosis not present

## 2022-04-02 DIAGNOSIS — Y733 Surgical instruments, materials and gastroenterology and urology devices (including sutures) associated with adverse incidents: Secondary | ICD-10-CM | POA: Diagnosis not present

## 2022-04-02 DIAGNOSIS — E1122 Type 2 diabetes mellitus with diabetic chronic kidney disease: Secondary | ICD-10-CM | POA: Diagnosis present

## 2022-04-02 DIAGNOSIS — Z515 Encounter for palliative care: Secondary | ICD-10-CM

## 2022-04-02 DIAGNOSIS — K2960 Other gastritis without bleeding: Secondary | ICD-10-CM | POA: Diagnosis present

## 2022-04-02 DIAGNOSIS — Z7984 Long term (current) use of oral hypoglycemic drugs: Secondary | ICD-10-CM | POA: Diagnosis not present

## 2022-04-02 DIAGNOSIS — K635 Polyp of colon: Secondary | ICD-10-CM | POA: Insufficient documentation

## 2022-04-02 DIAGNOSIS — Z79899 Other long term (current) drug therapy: Secondary | ICD-10-CM

## 2022-04-02 DIAGNOSIS — Z794 Long term (current) use of insulin: Secondary | ICD-10-CM | POA: Diagnosis not present

## 2022-04-02 DIAGNOSIS — D175 Benign lipomatous neoplasm of intra-abdominal organs: Secondary | ICD-10-CM | POA: Diagnosis present

## 2022-04-02 DIAGNOSIS — Z9861 Coronary angioplasty status: Secondary | ICD-10-CM

## 2022-04-02 LAB — COMPREHENSIVE METABOLIC PANEL
ALT: 15 U/L (ref 0–44)
AST: 17 U/L (ref 15–41)
Albumin: 3.2 g/dL — ABNORMAL LOW (ref 3.5–5.0)
Alkaline Phosphatase: 61 U/L (ref 38–126)
Anion gap: 7 (ref 5–15)
BUN: 40 mg/dL — ABNORMAL HIGH (ref 8–23)
CO2: 32 mmol/L (ref 22–32)
Calcium: 8.5 mg/dL — ABNORMAL LOW (ref 8.9–10.3)
Chloride: 100 mmol/L (ref 98–111)
Creatinine, Ser: 1.29 mg/dL — ABNORMAL HIGH (ref 0.61–1.24)
GFR, Estimated: 54 mL/min — ABNORMAL LOW (ref >60)
Glucose, Bld: 154 mg/dL — ABNORMAL HIGH (ref 70–99)
Potassium: 4.6 mmol/L (ref 3.5–5.1)
Sodium: 139 mmol/L (ref 135–145)
Total Bilirubin: 0.6 mg/dL (ref 0.3–1.2)
Total Protein: 6.6 g/dL (ref 6.5–8.1)

## 2022-04-02 LAB — CBC
HCT: 35.1 % — ABNORMAL LOW (ref 39.0–52.0)
Hemoglobin: 11.2 g/dL — ABNORMAL LOW (ref 13.0–17.0)
MCH: 30.9 pg (ref 26.0–34.0)
MCHC: 31.9 g/dL (ref 30.0–36.0)
MCV: 97 fL (ref 80.0–100.0)
Platelets: 116 10*3/uL — ABNORMAL LOW (ref 150–400)
RBC: 3.62 MIL/uL — ABNORMAL LOW (ref 4.22–5.81)
RDW: 16.8 % — ABNORMAL HIGH (ref 11.5–15.5)
WBC: 10.9 10*3/uL — ABNORMAL HIGH (ref 4.0–10.5)
nRBC: 0 % (ref 0.0–0.2)

## 2022-04-02 LAB — MAGNESIUM: Magnesium: 1.7 mg/dL (ref 1.7–2.4)

## 2022-04-02 LAB — PHOSPHORUS: Phosphorus: 3 mg/dL (ref 2.5–4.6)

## 2022-04-02 LAB — GLUCOSE, CAPILLARY: Glucose-Capillary: 94 mg/dL (ref 70–99)

## 2022-04-02 MED ORDER — HYDRALAZINE HCL 20 MG/ML IJ SOLN: 10.0000 mg | Freq: Four times a day (QID) | INTRAMUSCULAR | Status: DC | PRN

## 2022-04-02 MED ORDER — NICOTINE 21 MG/24HR TD PT24
21.0000 mg | MEDICATED_PATCH | Freq: Every day | TRANSDERMAL | Status: DC
  Administered 2022-04-02 – 2022-04-13 (×8): 21 mg via TRANSDERMAL
  Filled 2022-04-02 (×8): qty 1

## 2022-04-02 MED ORDER — ALBUTEROL SULFATE (2.5 MG/3ML) 0.083% IN NEBU
3.0000 mL | INHALATION_SOLUTION | RESPIRATORY_TRACT | Status: DC | PRN
  Administered 2022-04-03: 3 mL via RESPIRATORY_TRACT
  Filled 2022-04-02: qty 3

## 2022-04-02 MED ORDER — MORPHINE SULFATE (PF) 4 MG/ML IV SOLN: 4.0000 mg | INTRAVENOUS | Status: DC | PRN

## 2022-04-02 MED ORDER — ONDANSETRON HCL 4 MG/2ML IJ SOLN: 4.0000 mg | Freq: Four times a day (QID) | INTRAMUSCULAR | Status: DC | PRN

## 2022-04-02 MED ORDER — THIAMINE HCL 100 MG PO TABS
100.0000 mg | ORAL_TABLET | Freq: Every day | ORAL | Status: DC
  Filled 2022-04-02: qty 1

## 2022-04-02 MED ORDER — PANTOPRAZOLE SODIUM 40 MG PO TBEC
40.0000 mg | DELAYED_RELEASE_TABLET | Freq: Two times a day (BID) | ORAL | Status: DC
  Administered 2022-04-03 – 2022-04-08 (×10): 40 mg via ORAL
  Filled 2022-04-02 (×11): qty 1

## 2022-04-02 MED ORDER — ADULT MULTIVITAMIN W/MINERALS CH
1.0000 | ORAL_TABLET | Freq: Every day | ORAL | Status: DC
  Administered 2022-04-02 – 2022-04-08 (×6): 1 via ORAL
  Filled 2022-04-02 (×6): qty 1

## 2022-04-02 MED ORDER — IPRATROPIUM-ALBUTEROL 0.5-2.5 (3) MG/3ML IN SOLN
3.0000 mL | Freq: Four times a day (QID) | RESPIRATORY_TRACT | Status: DC | PRN
  Administered 2022-04-03 – 2022-04-10 (×2): 3 mL via RESPIRATORY_TRACT
  Filled 2022-04-02 (×2): qty 3

## 2022-04-02 MED ORDER — ENOXAPARIN SODIUM 40 MG/0.4ML IJ SOSY
40.0000 mg | PREFILLED_SYRINGE | INTRAMUSCULAR | Status: DC
  Filled 2022-04-02: qty 0.4

## 2022-04-02 MED ORDER — METOPROLOL SUCCINATE ER 50 MG PO TB24
50.0000 mg | ORAL_TABLET | Freq: Every day | ORAL | Status: DC
  Administered 2022-04-02 – 2022-04-06 (×5): 50 mg via ORAL
  Filled 2022-04-02 (×5): qty 1

## 2022-04-02 MED ORDER — TRIAMCINOLONE ACETONIDE 0.1 % EX OINT
TOPICAL_OINTMENT | Freq: Two times a day (BID) | CUTANEOUS | Status: DC
  Filled 2022-04-02 (×2): qty 15

## 2022-04-02 MED ORDER — DOCUSATE SODIUM 100 MG PO CAPS: 100.0000 mg | ORAL_CAPSULE | Freq: Two times a day (BID) | ORAL | Status: DC | PRN

## 2022-04-02 MED ORDER — ALBUTEROL SULFATE (2.5 MG/3ML) 0.083% IN NEBU: 2.5000 mg | INHALATION_SOLUTION | Freq: Four times a day (QID) | RESPIRATORY_TRACT | Status: DC | PRN

## 2022-04-02 MED ORDER — PANCRELIPASE (LIP-PROT-AMYL) 36000-114000 UNITS PO CPEP
36000.0000 [IU] | ORAL_CAPSULE | Freq: Three times a day (TID) | ORAL | Status: DC
  Administered 2022-04-03 – 2022-04-08 (×14): 36000 [IU] via ORAL
  Filled 2022-04-02 (×4): qty 3
  Filled 2022-04-02: qty 1
  Filled 2022-04-02 (×10): qty 3

## 2022-04-02 MED ORDER — ONDANSETRON 4 MG PO TBDP: 4.0000 mg | ORAL_TABLET | Freq: Four times a day (QID) | ORAL | Status: DC | PRN

## 2022-04-02 MED ORDER — INSULIN ASPART 100 UNIT/ML IJ SOLN: 0.0000 [IU] | Freq: Three times a day (TID) | INTRAMUSCULAR | Status: DC

## 2022-04-02 MED ORDER — LOSARTAN POTASSIUM 50 MG PO TABS
50.0000 mg | ORAL_TABLET | Freq: Every day | ORAL | Status: DC
  Filled 2022-04-02: qty 1

## 2022-04-02 NOTE — Assessment & Plan Note (Signed)
IV ppi.  Type/ screen.

## 2022-04-02 NOTE — Assessment & Plan Note (Addendum)
Lab Results  Component Value Date   CREATININE 1.29 (H) 04/02/2022   CREATININE 1.31 (H) 03/02/2022   CREATININE 1.02 03/01/2022  We will hold his losartan.

## 2022-04-02 NOTE — Assessment & Plan Note (Signed)
Stable no chest pain.  Continue metoprolol and statin.

## 2022-04-02 NOTE — Consult Note (Signed)
Initial Consultation Note   Patient: Micheal [name] GEX:[id number] DOB: Oct 15, 1932 PCP: Howard Pouch, NP DOA: 04/02/2022 DOS: the patient was seen and examined on 04/02/2022 Primary service: Benjamine Sprague, DO  Referring physician:  Dr.Sakai  Reason for consult: Hyperglycemia/ Medical Management.   Assessment/Plan: Assessment and Plan: DM (diabetes mellitus), type 2, uncontrolled, with hyperosmolarity ([place]) Poorly controlled last A1c is 10.8. We will start SSI coverage glycemic protocol.  As needed iv insulin.  Consistent carb cardiac diet.    Acute renal failure superimposed on stage 3b chronic kidney disease ([place]) Lab Results  Component Value Date   CREATININE 1.29 (H) 04/02/2022   CREATININE 1.31 (H) 03/02/2022   CREATININE 1.02 03/01/2022  We will hold his losartan.    Thrombocytopenia ([place]) Pt has had thrombocytopenia. He see hematologist and oncologist we will defer to them.  We will avoid DVT prophylaxis with antiplatelet and start mechanical prophylaxis.    CAD (coronary artery disease) Stable no chest pain.  Continue metoprolol and statin.   COPD (chronic obstructive pulmonary disease) (HCC) Stable.  PRN MDI.  Erosive gastritis IV ppi.  Type/ screen.   HTN (hypertension) Vitals:   04/02/22 2038  BP: 134/61  Continue metoprolol. Hold losartan to prevent potassium issue or worsening of renal failure. . Lab Results  Component Value Date   CREATININE 1.29 (H) 04/02/2022   CREATININE 1.31 (H) 03/02/2022   CREATININE 1.02 03/01/2022      Alcohol abuse Pt denies any alcohol history.  We will addl evel. Thiamine. CIWA/    TRH will continue to follow the patient.  HPI: Micheal [name] is a 88 y.o. male with past medical history of COPD/ CAD tobacco abuse admitted for colon surgery. PT being f/u with  oncology for cancer. Pt has hyperglycemia and is admitted to have E/M of tubulovillous adenoma.  Pt has a 60  pack year smoking history and is on  nicotine patch.  .  Review of Systems  Gastrointestinal:  Positive for diarrhea.  All other systems reviewed and are negative.   Past Medical History:  Diagnosis Date   Balance problem    INTERMITTENT WITH NEGATIVE WORKUP   Chronic kidney disease, stage 3b ([place])    Colonic mass    COPD (chronic obstructive pulmonary disease) ([place])    Coronary artery disease    Diabetes mellitus without complication ([place])    DM (diabetes mellitus), type 2, uncontrolled, with hyperosmolarity (HCC)    Erosive gastritis    GERD (gastroesophageal reflux disease)    Hypercholesteremia    Hypertension    Myocardial infarction ([place])    2005   Pneumonia    RECENT 5/17   Protein-calorie malnutrition (HCC)    Shortness of breath dyspnea    Thrombocytopenia (HCC)    Wheezing    Past Surgical History:  Procedure Laterality Date   APPENDECTOMY     CARDIAC CATHETERIZATION     CATARACT EXTRACTION W/PHACO Right 10/04/2015   Procedure: CATARACT EXTRACTION PHACO AND INTRAOCULAR LENS PLACEMENT (IOC);  Surgeon: [name], MD;  Location: [facility] ORS;  Service: Ophthalmology;  Laterality: Right;  [place] 02:06 AP% 17.9 CDE 22.65 fluid pack lot # [id number]   CATARACT EXTRACTION W/PHACO Left 12/13/2015   Procedure: CATARACT EXTRACTION PHACO AND INTRAOCULAR LENS PLACEMENT (IOC);  Surgeon: [name], MD;  Location: [facility] ORS;  Service: Ophthalmology;  Laterality: Left;  [place] 1.39 AP% 25.3 CDE 26.37 Fluid pack lot # [id number]   COLONOSCOPY     COLONOSCOPY WITH PROPOFOL N/A 03/01/2022  Procedure: COLONOSCOPY WITH PROPOFOL;  Surgeon: Lin Landsman, MD;  Location: [facility] ENDOSCOPY;  Service: Gastroenterology;  Laterality: N/A;   CORONARY ANGIOPLASTY     Stent placement   ESOPHAGOGASTRODUODENOSCOPY N/A 03/01/2022   Procedure: ESOPHAGOGASTRODUODENOSCOPY (EGD);  Surgeon: Lin Landsman, MD;  Location: [facility] ENDOSCOPY;  Service: Gastroenterology;  Laterality: N/A;   LAPAROTOMY N/A 03/30/2019   Procedure:  EXPLORATORY LAPAROTOMY, POSSIBLE BOWEL RESECTION,;  Surgeon: Olean Ree, MD;  Location: [facility] ORS;  Service: General;  Laterality: N/A;   ORIF ANKLE FRACTURE Left 01/10/2018   Procedure: OPEN REDUCTION INTERNAL FIXATION (ORIF) ANKLE FRACTURE;  Surgeon: Dereck Leep, MD;  Location: [facility] ORS;  Service: Orthopedics;  Laterality: Left;   Social History:  reports that he has quit smoking. He has quit using smokeless tobacco. He reports that he does not drink alcohol and does not use drugs.  No Known Allergies  Prior to Admission medications   Medication Sig Start Date End Date Taking? Authorizing Provider  albuterol (PROVENTIL HFA;VENTOLIN HFA) 108 (90 Base) MCG/ACT inhaler Inhale 2 puffs into the lungs every 6 (six) hours as needed for wheezing or shortness of breath. 08/21/17   [name], MD  albuterol (PROVENTIL) (2.5 MG/3ML) 0.083% nebulizer solution Inhale 3 mLs into the lungs every 4 (four) hours as needed for wheezing or shortness of breath. 08/26/17   [provider]  aspirin EC 81 MG tablet Take 81 mg by mouth daily. 08/26/17   [provider]  atorvastatin (LIPITOR) 40 MG tablet Take 40 mg by mouth daily.    [provider]  blood glucose meter kit and supplies Dispense based on patient and insurance preference. Use up to four times daily as directed. (FOR ICD-10 E10.9, E11.9). 03/02/22   Enzo Bi, MD  cholecalciferol (CHOLECALCIFEROL) 25 MCG tablet Take 1 tablet (1,000 Units total) by mouth daily. 03/03/22   Enzo Bi, MD  folic acid (FOLVITE) 1 MG tablet Take 1 tablet (1 mg total) by mouth daily. 03/03/22   Enzo Bi, MD  glipiZIDE (GLUCOTROL) 5 MG tablet Take 0.5 tablets (2.5 mg total) by mouth 2 (two) times daily before a meal. 03/02/22 05/31/22  Enzo Bi, MD  insulin glargine (LANTUS SOLOSTAR) 100 UNIT/ML Solostar Pen Inject 16 Units into the skin daily. 03/02/22 05/31/22  Enzo Bi, MD  Insulin Pen Needle 32G X 4 MM MISC 1 Units by Does not apply  route daily. 03/02/22 05/31/22  Enzo Bi, MD  ipratropium-albuterol (DUONEB) 0.5-2.5 (3) MG/3ML SOLN Inhale 3 mLs into the lungs every 6 (six) hours as needed for wheezing. 08/26/17   [provider]  lipase/protease/amylase (CREON) 36000 UNITS CPEP capsule Take 2 capsules with the first bite of each meal and 1 capsule with the first bite of each snack 03/15/22   Lin Landsman, MD  losartan (COZAAR) 50 MG tablet Take 50 mg by mouth daily. 12/10/21   [provider]  metoprolol succinate (TOPROL-XL) 50 MG 24 hr tablet Take 50 mg by mouth daily. 12/10/21   [provider]  Multiple Vitamin (MULTIVITAMIN WITH MINERALS) TABS tablet Take 1 tablet by mouth daily. 03/03/22   Enzo Bi, MD  nicotine (NICODERM CQ - DOSED IN MG/24 HOURS) 21 mg/24hr patch Place 1 patch (21 mg total) onto the skin daily. 03/03/22   Enzo Bi, MD  pantoprazole (PROTONIX) 40 MG tablet Take 1 tablet (40 mg total) by mouth 2 (two) times daily before a meal. 03/02/22 05/31/22  Enzo Bi, MD  thiamine (VITAMIN B1) 100 MG tablet  Take 1 tablet (100 mg total) by mouth daily. 03/03/22   Enzo Bi, MD  triamcinolone ointment (KENALOG) 0.1 % Apply topically 2 (two) times daily. 11/15/21   [provider]  TRIJARDY XR 25-09-998 MG TB24 Take 1 tablet by mouth daily. 01/01/22   [provider]    Physical Exam: Vitals:   04/02/22 2038  BP: 134/61  Pulse: 71  Resp: 18  Temp: 98.2 F (36.8 C)  TempSrc: Oral  SpO2: 95%  Weight: 60.1 kg  Height: _0  (1.702 m)   Physical Exam Vitals and nursing note reviewed.  Constitutional:      General: He is not in acute distress.    Appearance: He is not ill-appearing, toxic-appearing or diaphoretic.  HENT:     Head: Normocephalic and atraumatic.     Right Ear: Hearing and external ear normal.     Left Ear: Hearing and external ear normal.     Nose: Nose normal. No nasal deformity.     Mouth/Throat:     Lips: Pink.     Mouth: Mucous membranes  are moist.     Tongue: No lesions.     Pharynx: Oropharynx is clear.  Eyes:     Extraocular Movements: Extraocular movements intact.  Neck:     Vascular: No carotid bruit.  Cardiovascular:     Rate and Rhythm: Normal rate and regular rhythm.     Pulses: Normal pulses.     Heart sounds: Normal heart sounds.  Pulmonary:     Effort: Pulmonary effort is normal.     Breath sounds: Wheezing present.     Comments: Basilar wheezing bilaterally. Abdominal:     General: Bowel sounds are normal. There is no distension.     Palpations: Abdomen is soft. There is no mass.     Tenderness: There is no abdominal tenderness. There is no guarding.     Hernia: No hernia is present.  Musculoskeletal:     Right lower leg: No edema.     Left lower leg: No edema.  Skin:    General: Skin is warm.  Neurological:     General: No focal deficit present.     Mental Status: He is alert and oriented to person, place, and time.     Cranial Nerves: Cranial nerves 2-12 are intact.     Motor: Motor function is intact.  Psychiatric:        Attention and Perception: Attention normal.        Mood and Affect: Mood normal.        Speech: Speech normal.        Behavior: Behavior normal. Behavior is cooperative.        Cognition and Memory: Cognition normal.   Data Reviewed:  Results for orders placed or performed during the hospital encounter of 04/02/22 (from the past 24 hour(s))  Glucose, capillary     Status: None   Collection Time: 04/02/22  6:57 PM  Result Value Ref Range   Glucose-Capillary 94 70 - 99 mg/dL  CBC     Status: Abnormal   Collection Time: 04/02/22  8:22 PM  Result Value Ref Range   WBC 10.9 (H) 4.0 - 10.5 K/uL   RBC 3.62 (L) 4.22 - 5.81 MIL/uL   Hemoglobin 11.2 (L) 13.0 - 17.0 g/dL   HCT 35.1 (L) 39.0 - 52.0 %   MCV 97.0 80.0 - 100.0 fL   MCH 30.9 26.0 - 34.0 pg   MCHC 31.9 30.0 - 36.0  g/dL   RDW 16.8 (H) 11.5 - 15.5 %   Platelets 116 (L) 150 - 400 K/uL   nRBC 0.0 0.0 - 0.2 %   Comprehensive metabolic panel     Status: Abnormal   Collection Time: 04/02/22  8:22 PM  Result Value Ref Range   Sodium 139 135 - 145 mmol/L   Potassium 4.6 3.5 - 5.1 mmol/L   Chloride 100 98 - 111 mmol/L   CO2 32 22 - 32 mmol/L   Glucose, Bld 154 (H) 70 - 99 mg/dL   BUN 40 (H) 8 - 23 mg/dL   Creatinine, Ser 1.29 (H) 0.61 - 1.24 mg/dL   Calcium 8.5 (L) 8.9 - 10.3 mg/dL   Total Protein 6.6 6.5 - 8.1 g/dL   Albumin 3.2 (L) 3.5 - 5.0 g/dL   AST 17 15 - 41 U/L   ALT 15 0 - 44 U/L   Alkaline Phosphatase 61 38 - 126 U/L   Total Bilirubin 0.6 0.3 - 1.2 mg/dL   GFR, Estimated 54 (L) >60 mL/min   Anion gap 7 5 - 15  Magnesium     Status: None   Collection Time: 04/02/22  8:22 PM  Result Value Ref Range   Magnesium 1.7 1.7 - 2.4 mg/dL  Phosphorus     Status: None   Collection Time: 04/02/22  8:22 PM  Result Value Ref Range   Phosphorus 3.0 2.5 - 4.6 mg/dL   Thank you very much for involving [place] in the care of your patient.  Author: Para Skeans, MD 04/02/2022 9:09 PM  For on call review [email or web address].

## 2022-04-02 NOTE — Assessment & Plan Note (Signed)
Pt has had thrombocytopenia. He see hematologist and oncologist we will defer to them.  We will avoid DVT prophylaxis with antiplatelet and start mechanical prophylaxis.

## 2022-04-02 NOTE — Assessment & Plan Note (Signed)
Pt denies any alcohol history.  We will addl evel. Thiamine. CIWA/

## 2022-04-02 NOTE — H&P (Signed)
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 male who returns for evaluation of above. Continues to have high glucose reads since leaving hospital. States he has been taking medications as prescribed. Avoiding carbs. Does eat citrus fruits  Past Medical History: has a past medical history of CKD (chronic kidney disease), COPD (chronic obstructive pulmonary disease) (CMS-HCC), COPD (chronic obstructive pulmonary disease) (CMS-HCC), Diabetes mellitus type 2, uncomplicated (CMS-HCC), and Hypertension.  Past Surgical History: has a past surgical history that includes Open reduction and internal fixation of the left malleolar ankle fracture (01/10/2018).  Family History: family history includes Heart disease (age of onset: 42) in his father.  Social History: reports that he has quit smoking. His smoking use included cigarettes. He has a 60.00 pack-year smoking history. He has never used smokeless tobacco. He reports that he does not currently use alcohol. He reports that he does not use drugs.  Current Medications: has a current medication list which includes the following prescription(s): albuterol, aspirin, atorvastatin, blood glucose meter, cholecalciferol, fluticasone propion-salmeterol, glipizide, ipratropium-albuterol, lancets, losartan, metoprolol succinate, multivitamin, pancrelipase, pantoprazole, proair hfa, thiamine, triamcinolone, trijardy xr, and tamsulosin.  Allergies: No Known Allergies  ROS: A 15 point review of systems was performed and pertinent positives and negatives noted in HPI  Objective:   BP 106/57  Pulse 99  Ht 170.2 cm ('5\' 7"'$ ) Comment: per chart  Wt 66.7 kg (147 lb) Comment: per chart  BMI 23.02 kg/m  Constitutional : No distress, cooperative, alert Lymphatics/Throat: Supple with no lymphadenopathy Respiratory: Clear to auscultation bilaterally Cardiovascular: Regular rate and rhythm Gastrointestinal: Soft,  non-tender, non-distended, no organomegaly. Musculoskeletal: Steady gait and movement Skin: Cool and moist Psychiatric: Normal affect, non-agitated, not confused   LABS: SURGICAL PATHOLOGY CASE: [id number] PATIENT: Micheal [name] Surgical Pathology Report  Specimen Submitted: A. Duodenum; cbxs B. Stomach, random; cbxs C. Colon, random; cbxs D. Colon polyp, descending; cold snare E. Colon mass, at 35cm; cbxs  Clinical History: Chronic diarrhea, weight loss. Duodenal bulb erosions, erosive gastritis, colon polyp, colon mass  DIAGNOSIS: A. DUODENUM; COLD BIOPSY: - ENTERIC MUCOSA WITH PRESERVED VILLOUS ARCHITECTURE AND NO SIGNIFICANT HISTOPATHOLOGIC CHANGE. - NEGATIVE FOR FEATURES OF CELIAC, DYSPLASIA, AND MALIGNANCY.  B. STOMACH, RANDOM; COLD BIOPSY: - GASTRIC ANTRAL AND OXYNTIC MUCOSA DISPLAYING CHRONIC GASTRITIS WITH PATCHY ACTIVITY, AND REACTIVE FOVEOLAR HYPERPLASIA. - NEGATIVE FOR H. PYLORI, DYSPLASIA, AND MALIGNANCY.  Comment: Due to the presence of patchy active mucosal inflammation and difficulties visualizing superficial microorganisms, an immunohistochemical study directed against H. pylori was performed, and is negative.  C. COLON, RANDOM; COLD BIOPSY: - BENIGN COLONIC MUCOSA WITH PATCHY MELANOSIS COLI; OTHERWISE NO SIGNIFICANT HISTOPATHOLOGIC CHANGE. - NEGATIVE FOR ACTIVE MUCOSAL COLITIS AND FEATURES OF MICROSCOPIC COLITIS. - NEGATIVE FOR DYSPLASIA AND MALIGNANCY.  D. COLON POLYP, DESCENDING; COLD SNARE: - TUBULAR ADENOMA. - NEGATIVE FOR HIGH-GRADE DYSPLASIA AND MALIGNANCY.  E. COLON MASS, 35 CM; COLD BIOPSIES: - MULTIPLE FRAGMENTS OF TUBULOVILLOUS ADENOMA. - NO EVIDENCE OF HIGH-GRADE DYSPLASIA OR MALIGNANCY.  Comment: Multiple additional deeper recut levels were examined. The endoscopic impression of a frond-like, sessile, nonobstructing, partially circumferential mass is noted. There are no features present in the current biopsy diagnostic of  malignancy.  IHC slides were prepared by Launa Grill, [place]. All controls stained appropriately.  This test was developed and its performance characteristics determined by [facility]. It has not been cleared or approved by the [place] Food and Drug Administration. The FDA does not require this test to go through premarket FDA  review. This test is used for clinical purposes. It should not be regarded as investigational or for research. This laboratory is certified under the Clinical Laboratory Improvement Amendments (CLIA) as qualified to perform high complexity clinical laboratory testing.  GROSS DESCRIPTION: A. Labeled: Duodenum cbxs, unexplained weight loss and chronic diarrhea Received: Formalin Collection time: 1:14 PM on 03/01/2022 Placed into formalin time: 1:14 PM on 03/01/2022 Tissue fragment(s): 2 Size: Each 0.4 cm Description: Tan soft tissue fragments Entirely submitted in 1 cassette.  B. Labeled: Random stomach cbxs, gastric erosions Received: Formalin Collection time: 1:16 PM on 03/01/2022 Placed into formalin time: 1:16 PM on 03/01/2022 Tissue fragment(s): Multiple Size: Aggregate, 1.4 x 0.5 x 0.2 cm Description: Tan soft tissue fragments Entirely submitted in 1 cassette.  C. Labeled: Random colon cbxs, rule out microscopic colitis Received: Formalin Collection time: 1:34 PM on 03/01/2022 Placed into formalin time: 1:34 PM on 03/01/2022 Tissue fragment(s): Multiple Size: Aggregate, 1.5 x 0.8 x 0.2 cm Description: Tan soft tissue fragments Entirely submitted in 1 cassette.  D. Labeled: Descending colon polyp cold snare Received: Formalin Collection time: 1:40 PM on 03/01/2022 Placed into formalin time: 1:40 PM on 03/01/2022 Tissue fragment(s): Multiple Size: Aggregate, 1.7 x 0.9 x 0.2 cm Description: Received are at least 2 fragments of tan soft tissue admixed with intestinal debris. The ratio of soft tissue to intestinal debris is 30: 70. Entirely submitted  in 1 cassette.  E. Labeled: Colon mass at 35 cm cbxs Received: Formalin Collection time: 1:45 PM on 03/01/2022 Placed into formalin time: 1:45 PM on 03/01/2022 Tissue fragment(s): Multiple Size: Aggregate, 1.1 x 0.4 x 0.1 cm Description: Tan soft tissue fragments Entirely submitted in 1 cassette.  RB 03/02/2022  Final Diagnosis performed by Allena Napoleon, MD. Electronically signed 03/05/2022 5:12:39PM The electronic signature indicates that the named Attending Pathologist has evaluated the specimen Technical component performed at [facility], [address] [place], [place] [address] Lab: [phone] Dir: Rush Farmer, MD, MMM Professional component performed at [facility], [facility] [place], [place], [place] [address] Lab: [phone] Dir: Kathi Simpers, MD  CEA- 3.7  RADS: CLINICAL DATA: Colon mass. Looking for metastasis.  EXAM: CT CHEST, ABDOMEN, AND PELVIS WITH CONTRAST  TECHNIQUE: Multidetector CT imaging of the chest, abdomen and pelvis was performed following the standard protocol during bolus administration of intravenous contrast.  RADIATION DOSE REDUCTION: This exam was performed according to the departmental dose-optimization program which includes automated exposure control, adjustment of the mA and/or kV according to patient size and/or use of iterative reconstruction technique.  CONTRAST: 154m OMNIPAQUE IOHEXOL 300 MG/ML SOLN  COMPARISON: 04/02/2019  FINDINGS: CT CHEST FINDINGS  Cardiovascular: Normal heart size. No pericardial effusions. Calcification of the aorta and coronary arteries. No aortic aneurysm or dissection. Great vessel origins are patent.  Mediastinum/Nodes: Peripherally calcified low-attenuation lesion measuring 1.8 cm located posterior to the right thyroid and lateral to the esophagus. Likely representing an exophytic thyroid nodule. No change since prior study. Esophagus is decompressed but demonstrates  residual gas and fluid suggesting reflux or dysmotility. Mild diffuse esophageal wall thickening suggesting reflux disease or esophagitis. No lymphadenopathy.  Lungs/Pleura: Emphysematous changes in the lungs. Several focal areas of subpleural scarring and pleural thickening. No focal pulmonary nodule or consolidation. Minimal bilateral pleural effusions with mild bilateral basilar atelectasis.  Musculoskeletal: Old bilateral rib fractures. No focal bone lesion or bone destruction.  CT ABDOMEN PELVIS FINDINGS  Hepatobiliary: No focal liver lesions. Mild gallbladder wall thickening and edema. No stones identified. No  bile duct dilatation.  Pancreas: Unremarkable. No pancreatic ductal dilatation or surrounding inflammatory changes.  Spleen: Normal in size without focal abnormality.  Adrenals/Urinary Tract: No adrenal gland nodules. Kidneys are symmetrical with small subcentimeter cysts present. No imaging follow-up is indicated. No hydronephrosis. Bladder is moderately distended, possibly physiologic or outlet obstruction. No bladder wall thickening or stones. Small bladder diverticulum at the dome of the bladder.  Stomach/Bowel: Stomach, small bowel, and colon are not abnormally distended. Contrast material flows to the descending colon suggesting no evidence of obstruction. Anastomosis at the rectosigmoid junction appears patent. Appendix is not identified.  Vascular/Lymphatic: Aortic atherosclerosis. No enlarged abdominal or pelvic lymph nodes.  Reproductive: Uterus and bilateral adnexa are unremarkable.  Other: No abdominal wall hernia or abnormality. No abdominopelvic ascites.  Musculoskeletal: Degenerative changes. No focal bone lesions are identified.  IMPRESSION: 1. No evidence of metastatic disease in the chest, abdomen, or pelvis. 2. Stable appearance of peripherally calcified 1.8 cm right thyroid lesion. This has been evaluated on previous imaging. (ref: J Am  Coll Radiol. 2015 Feb;12(2): 143-50). 3. Mild bladder wall thickening is nonspecific, possibly physiologic, inflammatory or related to liver disease. No stones. 4. Distended bladder with bladder diverticula. 5. Aortic atherosclerosis. 6. Emphysematous changes in the lungs. Minimal bilateral pleural effusions and basilar atelectasis.  Electronically Signed By: Lucienne Capers M.D. 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 The risk of laparoscopic colon resection surgery includes, but not limited to, recurrence, bleeding, chronic pain, post-op infxn, post-op SBO or ileus, hernias, resection of bowel, re-anastamosis, possible ostomy placement and need for re-operation to address said risks. The risks of general anesthetic, if used, includes MI, CVA, sudden death or even reaction to anesthetic medications also discussed. Alternatives include continued observation. Benefits include possible symptom relief, preventing further decline in health and possible death.  Typical post-op recovery time of additional days in hospital for observation afterwards also discussed.  Pt continues to have glucose control issues so will admit to obtain euglycemia in preparation for upcoming surgery.  Hospitalist consulted.   labs/images/medications/previous chart entries reviewed personally and relevant changes/updates noted above.  Electronically signed by Benjamine Sprague, DO at 03/07/2022 8:05 AM EDT

## 2022-04-02 NOTE — Assessment & Plan Note (Signed)
Poorly controlled last A1c is 10.8. We will start SSI coverage glycemic protocol.  As needed iv insulin.  Consistent carb cardiac diet.

## 2022-04-02 NOTE — Assessment & Plan Note (Addendum)
Vitals:   04/02/22 2038  BP: 134/61  Continue metoprolol. Hold losartan to prevent potassium issue or worsening of renal failure. . Lab Results  Component Value Date   CREATININE 1.29 (H) 04/02/2022   CREATININE 1.31 (H) 03/02/2022   CREATININE 1.02 03/01/2022

## 2022-04-02 NOTE — Assessment & Plan Note (Signed)
Stable.  PRN MDI.

## 2022-04-03 DIAGNOSIS — E119 Type 2 diabetes mellitus without complications: Secondary | ICD-10-CM | POA: Diagnosis not present

## 2022-04-03 DIAGNOSIS — Z794 Long term (current) use of insulin: Secondary | ICD-10-CM | POA: Diagnosis not present

## 2022-04-03 LAB — GLUCOSE, CAPILLARY
Glucose-Capillary: 186 mg/dL — ABNORMAL HIGH (ref 70–99)
Glucose-Capillary: 318 mg/dL — ABNORMAL HIGH (ref 70–99)
Glucose-Capillary: 94 mg/dL (ref 70–99)
Glucose-Capillary: 319 mg/dL — ABNORMAL HIGH (ref 70–99)

## 2022-04-03 LAB — TYPE AND SCREEN
ABO/RH(D): A POS
Antibody Screen: NEGATIVE

## 2022-04-03 LAB — BASIC METABOLIC PANEL
Anion gap: 7 (ref 5–15)
BUN: 35 mg/dL — ABNORMAL HIGH (ref 8–23)
CO2: 32 mmol/L (ref 22–32)
Calcium: 8.8 mg/dL — ABNORMAL LOW (ref 8.9–10.3)
Chloride: 100 mmol/L (ref 98–111)
Creatinine, Ser: 1.33 mg/dL — ABNORMAL HIGH (ref 0.61–1.24)
GFR, Estimated: 52 mL/min — ABNORMAL LOW (ref >60)
Glucose, Bld: 246 mg/dL — ABNORMAL HIGH (ref 70–99)
Potassium: 4.5 mmol/L (ref 3.5–5.1)
Sodium: 139 mmol/L (ref 135–145)

## 2022-04-03 LAB — CBC
HCT: 35 % — ABNORMAL LOW (ref 39.0–52.0)
Hemoglobin: 11.2 g/dL — ABNORMAL LOW (ref 13.0–17.0)
MCH: 31 pg (ref 26.0–34.0)
MCHC: 32 g/dL (ref 30.0–36.0)
MCV: 97 fL (ref 80.0–100.0)
Platelets: 113 10*3/uL — ABNORMAL LOW (ref 150–400)
RBC: 3.61 MIL/uL — ABNORMAL LOW (ref 4.22–5.81)
RDW: 16.7 % — ABNORMAL HIGH (ref 11.5–15.5)
WBC: 8.8 10*3/uL (ref 4.0–10.5)
nRBC: 0 % (ref 0.0–0.2)

## 2022-04-03 MED ORDER — POLYETHYLENE GLYCOL 3350 17 GM/SCOOP PO POWD
1.0000 | Freq: Once | ORAL | Status: AC
  Administered 2022-04-04: 255 g via ORAL
  Filled 2022-04-03: qty 255

## 2022-04-03 MED ORDER — IPRATROPIUM-ALBUTEROL 0.5-2.5 (3) MG/3ML IN SOLN
3.0000 mL | Freq: Three times a day (TID) | RESPIRATORY_TRACT | Status: DC
  Administered 2022-04-03 – 2022-04-04 (×2): 3 mL via RESPIRATORY_TRACT
  Filled 2022-04-03 (×2): qty 3

## 2022-04-03 MED ORDER — IPRATROPIUM-ALBUTEROL 0.5-2.5 (3) MG/3ML IN SOLN
3.0000 mL | Freq: Three times a day (TID) | RESPIRATORY_TRACT | Status: DC
  Filled 2022-04-03: qty 3

## 2022-04-03 MED ORDER — INSULIN ASPART 100 UNIT/ML IJ SOLN
0.0000 [IU] | Freq: Every day | INTRAMUSCULAR | Status: DC
  Administered 2022-04-03: 4 [IU] via SUBCUTANEOUS
  Filled 2022-04-03 (×2): qty 1

## 2022-04-03 MED ORDER — METRONIDAZOLE 500 MG PO TABS
1000.0000 mg | ORAL_TABLET | ORAL | Status: AC
  Administered 2022-04-04 (×3): 1000 mg via ORAL
  Filled 2022-04-03 (×3): qty 2

## 2022-04-03 MED ORDER — SENNOSIDES-DOCUSATE SODIUM 8.6-50 MG PO TABS: 1.0000 | ORAL_TABLET | Freq: Every evening | ORAL | Status: DC | PRN

## 2022-04-03 MED ORDER — ERYTHROMYCIN BASE 250 MG PO TBEC
1000.0000 mg | DELAYED_RELEASE_TABLET | ORAL | Status: AC
  Administered 2022-04-04 (×3): 1000 mg via ORAL
  Filled 2022-04-03 (×3): qty 4

## 2022-04-03 MED ORDER — METOPROLOL TARTRATE 5 MG/5ML IV SOLN: 5.0000 mg | INTRAVENOUS | Status: DC | PRN

## 2022-04-03 MED ORDER — SODIUM CHLORIDE 0.9 % IV SOLN: INTRAVENOUS | Status: DC

## 2022-04-03 MED ORDER — GUAIFENESIN 100 MG/5ML PO LIQD: 5.0000 mL | ORAL | Status: DC | PRN

## 2022-04-03 MED ORDER — HYDRALAZINE HCL 20 MG/ML IJ SOLN: 10.0000 mg | INTRAMUSCULAR | Status: DC | PRN

## 2022-04-03 MED ORDER — INSULIN ASPART 100 UNIT/ML IJ SOLN
0.0000 [IU] | Freq: Three times a day (TID) | INTRAMUSCULAR | Status: DC
  Administered 2022-04-03: 3 [IU] via SUBCUTANEOUS
  Administered 2022-04-03: 11 [IU] via SUBCUTANEOUS
  Administered 2022-04-04: 2 [IU] via SUBCUTANEOUS
  Filled 2022-04-03 (×2): qty 1

## 2022-04-03 MED ORDER — INSULIN ASPART 100 UNIT/ML IJ SOLN
3.0000 [IU] | Freq: Three times a day (TID) | INTRAMUSCULAR | Status: DC
  Administered 2022-04-03 – 2022-04-04 (×2): 3 [IU] via SUBCUTANEOUS
  Filled 2022-04-03: qty 1

## 2022-04-03 MED ORDER — BISACODYL 5 MG PO TBEC
20.0000 mg | DELAYED_RELEASE_TABLET | Freq: Once | ORAL | Status: AC
  Administered 2022-04-04: 20 mg via ORAL
  Filled 2022-04-03: qty 4

## 2022-04-03 MED ORDER — INSULIN GLARGINE-YFGN 100 UNIT/ML ~~LOC~~ SOLN
20.0000 [IU] | Freq: Every day | SUBCUTANEOUS | Status: DC
  Administered 2022-04-03 – 2022-04-04 (×2): 20 [IU] via SUBCUTANEOUS
  Filled 2022-04-03 (×2): qty 0.2

## 2022-04-03 NOTE — TOC Initial Note (Signed)
Transition of Care [facility]) - Initial/Assessment Note    Patient Details  Name: Micheal [name] MRN: [id number] Date of Birth: 1932-11-15  Transition of Care [facility]) CM/SW Contact:    Beverly Sessions, RN Phone Number: 04/03/2022, 10:29 AM  Clinical Narrative:                    Admitted VEL:[id number] adenoma  Admitted from: home with wife PCP: Nancie Neas Current home health/prior home health/DME: RW, [facility], shower seat, Kasandra Knudsen,   Message sent to Gila with [facility] to determine if patient is still active     Patient Goals and CMS Choice        Expected Discharge Plan and Services                                                Prior Living Arrangements/Services                       Activities of Daily Living Home Assistive Devices/Equipment: Cane (specify quad or straight), CBG Meter ADL Screening (condition at time of admission) Patient's cognitive ability adequate to safely complete daily activities?: Yes Is the patient deaf or have difficulty hearing?: No Does the patient have difficulty seeing, even when wearing glasses/contacts?: No Does the patient have difficulty concentrating, remembering, or making decisions?: No Patient able to express need for assistance with ADLs?: Yes Does the patient have difficulty dressing or bathing?: No Independently performs ADLs?: Yes (appropriate for developmental age) Does the patient have difficulty walking or climbing stairs?: No Weakness of Legs: None Weakness of Arms/Hands: None  Permission Sought/Granted                  Emotional Assessment              Admission diagnosis:  Colon polyp [K63.5] Patient Active Problem List   Diagnosis Date Noted   Colon polyp 04/02/2022   Unexplained weight loss    Erosive gastritis    Colonic mass    Protein-calorie malnutrition, severe 02/28/2022   DM (diabetes mellitus), type 2, uncontrolled, with hyperosmolarity ([place]) 02/28/2022   Hyperosmolar  hyperglycemic state (HHS) ([place]) 02/27/2022   HLD (hyperlipidemia) 02/27/2022   CAD (coronary artery disease) 02/27/2022   Tobacco abuse 02/27/2022   Chronic kidney disease, stage 3b ([place]) 02/27/2022   UTI (urinary tract infection) 02/27/2022   Diarrhea 02/27/2022   Thrombocytopenia ([place]) 02/27/2022   Hyperkalemia 02/27/2022   Chronic diarrhea of unknown origin    Smoker 03/26/2019   Acute renal failure superimposed on stage 3b chronic kidney disease ([place]) 03/26/2019   Hyponatremia 03/26/2019   Leucocytosis 03/26/2019   SBO (small bowel obstruction) ([place]) 03/25/2019   Acute urinary retention 12/01/2018   Pleural effusion on right 12/01/2018   Bimalleolar ankle fracture, left, closed, initial encounter 01/09/2018   Respiratory failure with hypoxia and hypercapnia ([place]) 08/19/2017   Community acquired pneumonia of left lower lobe of lung    Gastroesophageal reflux disease without esophagitis 05/26/2015   Alcohol abuse 01/01/2013   COPD (chronic obstructive pulmonary disease) ([place]) 10/22/2012   HTN (hypertension) 10/22/2012   PCP:  Howard Pouch, NP Pharmacy:   [facility], [place] [address] Phone: [phone] Fax: [phone]  [id number] [place] [address] Phone: [phone] Fax: [place] [address]), [place] - [address] [place]) [place] [address] Phone: [phone] Fax: [phone]     Social Determinants of Health (SDOH) Interventions    Readmission Risk Interventions     No data to display

## 2022-04-03 NOTE — Progress Notes (Signed)
PROGRESS NOTE    Micheal [name]  GHW:[id number] DOB: [birth year] DOA: 04/02/2022 PCP: Howard Pouch, NP   Brief Narrative:  88 year old with history of COPD, CAD, CKD, DM 2, HTN, tubulovillous adenoma of colon tobacco use admitted for laparoscopic colon resection.  Medical team consulted for medical management especially with blood glucose.   Assessment & Plan:  Active Problems:   DM (diabetes mellitus), type 2, uncontrolled, with hyperosmolarity ([place])   Acute renal failure superimposed on stage 3b chronic kidney disease (HCC)   Thrombocytopenia (HCC)   CAD (coronary artery disease)   COPD (chronic obstructive pulmonary disease) (HCC)   Erosive gastritis   HTN (hypertension)   Alcohol abuse     Assessment and Plan: DM (diabetes mellitus), type 2, uncontrolled, with hyperosmolarity ([place]) Poorly controlled last A1c is 10.8.  Patient's home medication Lantus 16 units daily, glipizide 2.5 mg twice daily, Trijardy. -Currently on sliding scale.  Carb modified diet. - Hyperglycemia, long-acting Lantus, Premeal and sliding scale ordered.  Holding home p.o. regimen in anticipation of patient being n.p.o. for the surgery   Acute renal failure superimposed on stage 3b chronic kidney disease (HCC) Baseline creatinine 1.0, yesterday 1.29.  Lab work is currently pending.  Losartan on hold.  IV as needed ordered   COPD (chronic obstructive pulmonary disease) (HCC) Mild abnormal breath sounds Scheduled and as needed bronchodilators   Thrombocytopenia (HCC) Appears to be chronic.  No active signs of bleeding.  Continue to monitor.  Okay for chemo DVT prophylaxis if necessary postoperatively depending on surgical course  Colon mass, tubulovillous adenoma - Suspicion for malignancy.  Admitted under general surgery during this admission for laparoscopic resection  CAD (coronary artery disease) No chest pain.  On Toprol-XL   Erosive gastritis PPI twice daily  HTN  (hypertension) Continue Toprol-XL.  IV as needed ordered  Remote Hx of EtOH abuse, currently no signs of withdrawal.   DVT prophylaxis: Per general surgery, okay for chemoprophylaxis Code Status: Full code Family Communication:    Maintain hospital stay for surgical intervention   Subjective: Seen and bedside, eating his breakfast, does not have any complaints.  Overall feels well   Examination:  General exam: Appears calm and comfortable  Respiratory system: Bilateral diminished breath sounds Cardiovascular system: S1 & S2 heard, RRR. No JVD, murmurs, rubs, gallops or clicks. No pedal edema. Gastrointestinal system: Abdomen is nondistended, soft and nontender. No organomegaly or masses felt. Normal bowel sounds heard. Central nervous system: Alert and oriented. No focal neurological deficits. Extremities: Symmetric 5 x 5 power. Skin: No rashes, lesions or ulcers Psychiatry: Judgement and insight appear normal. Mood & affect appropriate.     Objective: Vitals:   04/02/22 2038 04/03/22 0057  BP: 134/61   Pulse: 71   Resp: 18   Temp: 98.2 F (36.8 C)   TempSrc: Oral   SpO2: 95% 92%  Weight: 60.1 kg   Height: '5\' 7"'$  (1.702 m)     Intake/Output Summary (Last 24 hours) at 04/03/2022 0756 Last data filed at 04/03/2022 0534 Gross per 24 hour  Intake 0 ml  Output --  Net 0 ml   Filed Weights   04/02/22 2038  Weight: 60.1 kg     Data Reviewed:   CBC: Recent Labs  Lab 04/02/22 2022  WBC 10.9*  HGB 11.2*  HCT 35.1*  MCV 97.0  PLT 789*   Basic Metabolic Panel: Recent Labs  Lab 04/02/22 2022  NA 139  K 4.6  CL 100  CO2 32  GLUCOSE 154*  BUN 40*  CREATININE 1.29*  CALCIUM 8.5*  MG 1.7  PHOS 3.0   GFR: Estimated Creatinine Clearance: 34.3 mL/min (A) (by C-G formula based on SCr of 1.29 mg/dL (H)). Liver Function Tests: Recent Labs  Lab 04/02/22 2022  AST 17  ALT 15  ALKPHOS 61  BILITOT 0.6  PROT 6.6  ALBUMIN 3.2*   No results for  input(s): "LIPASE", "AMYLASE" in the last 168 hours. No results for input(s): "AMMONIA" in the last 168 hours. Coagulation Profile: No results for input(s): "INR", "PROTIME" in the last 168 hours. Cardiac Enzymes: No results for input(s): "CKTOTAL", "CKMB", "CKMBINDEX", "TROPONINI" in the last 168 hours. BNP (last 3 results) No results for input(s): "PROBNP" in the last 8760 hours. HbA1C: No results for input(s): "HGBA1C" in the last 72 hours. CBG: Recent Labs  Lab 04/02/22 1857  GLUCAP 94   Lipid Profile: No results for input(s): "CHOL", "HDL", "LDLCALC", "TRIG", "CHOLHDL", "LDLDIRECT" in the last 72 hours. Thyroid Function Tests: No results for input(s): "TSH", "T4TOTAL", "FREET4", "T3FREE", "THYROIDAB" in the last 72 hours. Anemia Panel: No results for input(s): "VITAMINB12", "FOLATE", "FERRITIN", "TIBC", "IRON", "RETICCTPCT" in the last 72 hours. Sepsis Labs: No results for input(s): "PROCALCITON", "LATICACIDVEN" in the last 168 hours.  No results found for this or any previous visit (from the past 240 hour(s)).       Radiology Studies: No results found.      Scheduled Meds:  insulin aspart  0-15 Units Subcutaneous TID WC   lipase/protease/amylase  36,000 Units Oral TID AC   metoprolol succinate  50 mg Oral Daily   multivitamin with minerals  1 tablet Oral Daily   nicotine  21 mg Transdermal Daily   pantoprazole  40 mg Oral BID AC   thiamine  100 mg Oral Daily   triamcinolone ointment   Topical BID   Continuous Infusions:   LOS: 1 day   Time spent= 35 mins    Boubacar Lerette Arsenio Loader, MD [facility]  If 7PM-7AM, please contact night-coverage  04/03/2022, 7:56 AM

## 2022-04-03 NOTE — Progress Notes (Signed)
Subjective:  CC: Micheal [name] is a 88 y.o. male  Hospital stay day 1,   hyperglycemia, tubulovillous adenoma  HPI: No acute complaints this am.  ROS:  General: Denies weight loss, weight gain, fatigue, fevers, chills, and night sweats. Heart: Denies chest pain, palpitations, racing heart, irregular heartbeat, leg pain or swelling, and decreased activity tolerance. Respiratory: Denies breathing difficulty, shortness of breath, wheezing, cough, and sputum. GI: Denies change in appetite, heartburn, nausea, vomiting, constipation, diarrhea, and blood in stool. GU: Denies difficulty urinating, pain with urinating, urgency, frequency, blood in urine.   Objective:   Temp:  [98.2 F (36.8 C)] 98.2 F (36.8 C) (11/13 2038) Pulse Rate:  [71] 71 (11/13 2038) Resp:  [18] 18 (11/13 2038) BP: (134)/(61) 134/61 (11/13 2038) SpO2:  [92 %-95 %] 92 % (11/14 0057) Weight:  [60.1 kg] 60.1 kg (11/13 2038)     Height: '5\' 7"'$  (170.2 cm) Weight: 60.1 kg BMI (Calculated): 20.73   Intake/Output this shift:   Intake/Output Summary (Last 24 hours) at 04/03/2022 0854 Last data filed at 04/03/2022 0534 Gross per 24 hour  Intake 0 ml  Output --  Net 0 ml    Constitutional :  alert, cooperative, appears stated age, and no distress  Respiratory:  clear to auscultation bilaterally  Cardiovascular:  regular rate and rhythm  Gastrointestinal: soft, non-tender; bowel sounds normal; no masses,  no organomegaly.   Skin: Cool and moist.   Psychiatric: Normal affect, non-agitated, not confused       LABS:     Latest Ref Rng & Units 04/02/2022    8:22 PM 03/02/2022    5:51 AM 03/01/2022    5:35 AM  CMP  Glucose 70 - 99 mg/dL 154  335  121   BUN 8 - 23 mg/dL 40  21  23   Creatinine 0.61 - 1.24 mg/dL 1.29  1.31  1.02   Sodium 135 - 145 mmol/L 139  135  138   Potassium 3.5 - 5.1 mmol/L 4.6  4.7  4.4   Chloride 98 - 111 mmol/L 100  104  106   CO2 22 - 32 mmol/L 32  26  24   Calcium 8.9 - 10.3 mg/dL 8.5   8.3  8.4   Total Protein 6.5 - 8.1 g/dL 6.6     Total Bilirubin 0.3 - 1.2 mg/dL 0.6     Alkaline Phos 38 - 126 U/L 61     AST 15 - 41 U/L 17     ALT 0 - 44 U/L 15         Latest Ref Rng & Units 04/02/2022    8:22 PM 03/02/2022    5:51 AM 03/01/2022    5:35 AM  CBC  WBC 4.0 - 10.5 K/uL 10.9  9.8  12.1   Hemoglobin 13.0 - 17.0 g/dL 11.2  9.8  10.3   Hematocrit 39.0 - 52.0 % 35.1  30.7  31.8   Platelets 150 - 400 K/uL 116  111  110     RADS: N/a Assessment:   Hx of hyperglycemia, tubulovillous adenoma. Stable.  Discussed r/b/a to upcoming surgey again, all questions addressed.  Bowel prep orders in.  Will start tomorrow.  Appreciate hospitalist consult for comorbidities   labs/images/medications/previous chart entries reviewed personally and relevant changes/updates noted above.

## 2022-04-04 DIAGNOSIS — Z794 Long term (current) use of insulin: Secondary | ICD-10-CM | POA: Diagnosis not present

## 2022-04-04 DIAGNOSIS — E1122 Type 2 diabetes mellitus with diabetic chronic kidney disease: Secondary | ICD-10-CM

## 2022-04-04 DIAGNOSIS — N1831 Chronic kidney disease, stage 3a: Secondary | ICD-10-CM | POA: Diagnosis not present

## 2022-04-04 LAB — BASIC METABOLIC PANEL
Anion gap: 5 (ref 5–15)
BUN: 40 mg/dL — ABNORMAL HIGH (ref 8–23)
CO2: 30 mmol/L (ref 22–32)
Calcium: 8.2 mg/dL — ABNORMAL LOW (ref 8.9–10.3)
Chloride: 101 mmol/L (ref 98–111)
Creatinine, Ser: 1.36 mg/dL — ABNORMAL HIGH (ref 0.61–1.24)
GFR, Estimated: 50 mL/min — ABNORMAL LOW (ref >60)
Glucose, Bld: 200 mg/dL — ABNORMAL HIGH (ref 70–99)
Potassium: 4.2 mmol/L (ref 3.5–5.1)
Sodium: 136 mmol/L (ref 135–145)

## 2022-04-04 LAB — URINALYSIS, COMPLETE (UACMP) WITH MICROSCOPIC
Bacteria, UA: NONE SEEN
Bilirubin Urine: NEGATIVE
Glucose, UA: 50 mg/dL — AB
Hgb urine dipstick: NEGATIVE
Ketones, ur: NEGATIVE mg/dL
Leukocytes,Ua: NEGATIVE
Nitrite: NEGATIVE
Protein, ur: NEGATIVE mg/dL
Specific Gravity, Urine: 1.008 (ref 1.005–1.030)
pH: 6 (ref 5.0–8.0)

## 2022-04-04 LAB — GLUCOSE, CAPILLARY
Glucose-Capillary: 130 mg/dL — ABNORMAL HIGH (ref 70–99)
Glucose-Capillary: 92 mg/dL (ref 70–99)
Glucose-Capillary: 96 mg/dL (ref 70–99)
Glucose-Capillary: 233 mg/dL — ABNORMAL HIGH (ref 70–99)
Glucose-Capillary: 46 mg/dL — ABNORMAL LOW (ref 70–99)
Glucose-Capillary: 81 mg/dL (ref 70–99)

## 2022-04-04 LAB — BRAIN NATRIURETIC PEPTIDE: B Natriuretic Peptide: 345.4 pg/mL — ABNORMAL HIGH (ref 0.0–100.0)

## 2022-04-04 LAB — MAGNESIUM: Magnesium: 1.8 mg/dL (ref 1.7–2.4)

## 2022-04-04 MED ORDER — DEXTROSE 50 % IV SOLN
25.0000 g | INTRAVENOUS | Status: DC
  Filled 2022-04-04: qty 50

## 2022-04-04 MED ORDER — INSULIN ASPART 100 UNIT/ML IJ SOLN
0.0000 [IU] | INTRAMUSCULAR | Status: DC
  Administered 2022-04-04: 5 [IU] via SUBCUTANEOUS
  Administered 2022-04-06: 2 [IU] via SUBCUTANEOUS
  Filled 2022-04-04 (×2): qty 1

## 2022-04-04 MED ORDER — MAGNESIUM SULFATE 2 GM/50ML IV SOLN
2.0000 g | Freq: Once | INTRAVENOUS | Status: AC
  Administered 2022-04-04: 2 g via INTRAVENOUS
  Filled 2022-04-04: qty 50

## 2022-04-04 MED ORDER — SODIUM CHLORIDE 0.9 % IV SOLN: INTRAVENOUS | Status: DC

## 2022-04-04 NOTE — Inpatient Diabetes Management (Signed)
Inpatient Diabetes Program Recommendations  AACE/ADA: New Consensus Statement on Inpatient Glycemic Control (2015)  Target Ranges:  Prepandial:   less than 140 mg/dL      Peak postprandial:   less than 180 mg/dL (1-2 hours)      Critically ill patients:  140 - 180 mg/dL   Lab Results  Component Value Date   GLUCAP 130 (H) 04/04/2022   HGBA1C 10.8 (H) 02/27/2022      Current orders for Inpatient glycemic control: Novolog 0-15 units Q4H Novolog 0-5 units QHS Semglee 20 units QD  Inpatient Diabetes Program Recommendations:    Please discontinue Novolog 0-5 units QHS as he is currently ordered correction Q4H.  Will continue to follow while inpatient.  Thank you, Reche Dixon, MSN, [place] [facility] Coordinator Inpatient Diabetes Program [phone] (team pager from 8a-5p)

## 2022-04-04 NOTE — Progress Notes (Signed)
PROGRESS NOTE    Micheal [name]  AXE:[id number] DOB: Dec 08, 1932 DOA: 04/02/2022 PCP: Howard Pouch, NP    Brief Narrative:  88 year old with history of COPD, CAD, CKD, DM 2, HTN, tubulovillous adenoma of colon tobacco use admitted for laparoscopic colon resection.  Medical team consulted for medical management especially with blood glucose.    Assessment & Plan:   Active Problems:   DM (diabetes mellitus), type 2, uncontrolled, with hyperosmolarity ([name])   Acute renal failure superimposed on stage 3b chronic kidney disease (HCC)   Thrombocytopenia (HCC)   CAD (coronary artery disease)   COPD (chronic obstructive pulmonary disease) (HCC)   Erosive gastritis   HTN (hypertension)   Alcohol abuse  DM (diabetes mellitus), type 2, uncontrolled, with hyperosmolarity ([name]) Poorly controlled last A1c is 10.8.   Patient's home medication Lantus 16 units daily, glipizide 2.5 mg twice daily, Trijardy. Sugars labile Plan: Patient will be on clear liquid diet today and n.p.o. after midnight.  As such we will discontinue scheduled NovoLog.  Increase sliding scale to every 4 hours.  Discontinue nightly scale.  Continue Semglee 20 units daily.  Hold oral antihyperglycemic regimen   Acute renal failure superimposed on stage 3b chronic kidney disease (HCC) Creatinine worsening over interval.  Losartan on hold.  Will place on gentle IV fluids normal saline at 60 cc/h.  Recheck creatinine in a.m.     COPD (chronic obstructive pulmonary disease) (HCC) Mild abnormal breath sounds Breath sounds improved.  Air movement coarse but no obvious wheezing.  Patient on room air.  Continue bronchodilator regimen.     Thrombocytopenia ([place]) Appears to be chronic.  No active signs of bleeding.  Continue to monitor.  Okay for chemo DVT prophylaxis if necessary postoperatively depending on surgical course   Colon mass, tubulovillous adenoma - Suspicion for malignancy.  Admitted under general surgery  during this admission for laparoscopic resection.  Management per surgery   CAD (coronary artery disease) No chest pain.  On Toprol-XL     Erosive gastritis PPI twice daily   HTN (hypertension) Continue Toprol-XL.  IV as needed ordered   Remote Hx of EtOH abuse, currently no signs of withdrawal.   Thank you for this consult.  [facility] hospitalist service to continue following patient with you.  Recommendations discussed with primary attending Dr. Lysle Pearl.  DVT prophylaxis: Per general surgery Code Status: Full Family Communication: None Disposition Plan: Per general surgery Level of care: Med-Surg  Consultants:  Hospitalist  Procedures:  None  Antimicrobials: Flagyl Erythromycin   Subjective: Seen and examined.  Sitting up in bed.  No apparent distress.  No complaints of pain.  Objective: Vitals:   04/03/22 1941 04/03/22 2001 04/04/22 0737 04/04/22 0749  BP:  (!) 98/50  (!) 108/53  Pulse:  80  68  Resp:  20  18  Temp:  97.9 F (36.6 C)  97.8 F (36.6 C)  TempSrc:  Oral    SpO2: 94% 96% 96% 100%  Weight:      Height:        Intake/Output Summary (Last 24 hours) at 04/04/2022 1224 Last data filed at 04/03/2022 2154 Gross per 24 hour  Intake 240 ml  Output --  Net 240 ml   Filed Weights   04/02/22 2038  Weight: 60.1 kg    Examination:  General exam: NAD.  Appears fatigued Respiratory system: Coarse breath sounds bilaterally.  Diminished air movement.  Normal work of breathing.  Room air Cardiovascular system: S1-S2, RRR, no murmurs, no pedal  edema Gastrointestinal system: Thin, soft, NT/ND, normal bowel sounds Central nervous system: Alert and oriented. No focal neurological deficits. Extremities: Symmetric 5 x 5 power. Skin: No rashes, lesions or ulcers Psychiatry: Judgement and insight appear normal. Mood & affect appropriate.     Data Reviewed: I have personally reviewed following labs and imaging studies  CBC: Recent Labs  Lab 04/02/22 2022  04/03/22 0929  WBC 10.9* 8.8  HGB 11.2* 11.2*  HCT 35.1* 35.0*  MCV 97.0 97.0  PLT 116* 201*   Basic Metabolic Panel: Recent Labs  Lab 04/02/22 2022 04/03/22 0929 04/04/22 0433  NA 139 139 136  K 4.6 4.5 4.2  CL 100 100 101  CO2 32 32 30  GLUCOSE 154* 246* 200*  BUN 40* 35* 40*  CREATININE 1.29* 1.33* 1.36*  CALCIUM 8.5* 8.8* 8.2*  MG 1.7  --  1.8  PHOS 3.0  --   --    GFR: Estimated Creatinine Clearance: 32.5 mL/min (A) (by C-G formula based on SCr of 1.36 mg/dL (H)). Liver Function Tests: Recent Labs  Lab 04/02/22 2022  AST 17  ALT 15  ALKPHOS 61  BILITOT 0.6  PROT 6.6  ALBUMIN 3.2*   No results for input(s): "LIPASE", "AMYLASE" in the last 168 hours. No results for input(s): "AMMONIA" in the last 168 hours. Coagulation Profile: No results for input(s): "INR", "PROTIME" in the last 168 hours. Cardiac Enzymes: No results for input(s): "CKTOTAL", "CKMB", "CKMBINDEX", "TROPONINI" in the last 168 hours. BNP (last 3 results) No results for input(s): "PROBNP" in the last 8760 hours. HbA1C: No results for input(s): "HGBA1C" in the last 72 hours. CBG: Recent Labs  Lab 04/03/22 1142 04/03/22 1625 04/03/22 2129 04/04/22 0750 04/04/22 1216  GLUCAP 318* 94 319* 130* 92   Lipid Profile: No results for input(s): "CHOL", "HDL", "LDLCALC", "TRIG", "CHOLHDL", "LDLDIRECT" in the last 72 hours. Thyroid Function Tests: No results for input(s): "TSH", "T4TOTAL", "FREET4", "T3FREE", "THYROIDAB" in the last 72 hours. Anemia Panel: No results for input(s): "VITAMINB12", "FOLATE", "FERRITIN", "TIBC", "IRON", "RETICCTPCT" in the last 72 hours. Sepsis Labs: No results for input(s): "PROCALCITON", "LATICACIDVEN" in the last 168 hours.  No results found for this or any previous visit (from the past 240 hour(s)).       Radiology Studies: No results found.      Scheduled Meds:  erythromycin  1,000 mg Oral Q2H   insulin aspart  0-15 Units Subcutaneous Q4H    insulin glargine-yfgn  20 Units Subcutaneous Daily   lipase/protease/amylase  36,000 Units Oral TID AC   metoprolol succinate  50 mg Oral Daily   metroNIDAZOLE  1,000 mg Oral Q2H   multivitamin with minerals  1 tablet Oral Daily   nicotine  21 mg Transdermal Daily   pantoprazole  40 mg Oral BID AC   polyethylene glycol powder  1 Container Oral Once   triamcinolone ointment   Topical BID   Continuous Infusions:  sodium chloride 60 mL/hr at 04/04/22 1002     LOS: 2 days    Sidney Ace, MD [facility]   If 7PM-7AM, please contact night-coverage  04/04/2022, 12:24 PM

## 2022-04-04 NOTE — Progress Notes (Addendum)
   04/04/22 2309  Provider Notification  Provider Name/Title NP [place]  Date Provider Notified 04/04/22  Time Provider Notified 2309  Method of Notification Page (secure chat)  Notification Reason Other (Comment) (hypoglycemia, CBG 46)  Provider response No new orders;Other (Comment) (Hypoglycemia protocol activated.)   NP [place] updated of CBG rechecked after administering 8oz of orange juice, CBG up to '81mg'$ /dl.

## 2022-04-04 NOTE — TOC Progression Note (Signed)
Transition of Care [facility]) - Progression Note    Patient Details  Name: Micheal [name] MRN: [id number] Date of Birth: 11-15-1932  [facility] of [facility]) CM/SW Contact  Beverly Sessions, RN Phone Number: 04/04/2022, 1:55 PM  Clinical Narrative:     Per Merleen Nicely patient was not opened after last admission because he decline services after discharge        Expected Discharge Plan and Services                                                 Social Determinants of Health (SDOH) Interventions    Readmission Risk Interventions     No data to display

## 2022-04-05 ENCOUNTER — Inpatient Hospital Stay: Payer: Medicare PPO | Admitting: Anesthesiology

## 2022-04-05 ENCOUNTER — Encounter
Admission: RE | Disposition: A | Discharge status: Hospice | Payer: Self-pay | Source: Ambulatory Visit | Attending: Surgery

## 2022-04-05 ENCOUNTER — Other Ambulatory Visit: Payer: Self-pay

## 2022-04-05 ENCOUNTER — Encounter: Payer: Self-pay | Admitting: Surgery

## 2022-04-05 DIAGNOSIS — K6389 Other specified diseases of intestine: Secondary | ICD-10-CM

## 2022-04-05 DIAGNOSIS — E1122 Type 2 diabetes mellitus with diabetic chronic kidney disease: Secondary | ICD-10-CM | POA: Diagnosis not present

## 2022-04-05 DIAGNOSIS — N1831 Chronic kidney disease, stage 3a: Secondary | ICD-10-CM | POA: Diagnosis not present

## 2022-04-05 DIAGNOSIS — Z794 Long term (current) use of insulin: Secondary | ICD-10-CM | POA: Diagnosis not present

## 2022-04-05 LAB — GLUCOSE, CAPILLARY
Glucose-Capillary: 63 mg/dL — ABNORMAL LOW (ref 70–99)
Glucose-Capillary: 115 mg/dL — ABNORMAL HIGH (ref 70–99)
Glucose-Capillary: 110 mg/dL — ABNORMAL HIGH (ref 70–99)
Glucose-Capillary: 124 mg/dL — ABNORMAL HIGH (ref 70–99)
Glucose-Capillary: 60 mg/dL — ABNORMAL LOW (ref 70–99)
Glucose-Capillary: 77 mg/dL (ref 70–99)
Glucose-Capillary: 94 mg/dL (ref 70–99)
Glucose-Capillary: 120 mg/dL — ABNORMAL HIGH (ref 70–99)

## 2022-04-05 LAB — BASIC METABOLIC PANEL
Anion gap: 9 (ref 5–15)
BUN: 30 mg/dL — ABNORMAL HIGH (ref 8–23)
CO2: 26 mmol/L (ref 22–32)
Calcium: 8.5 mg/dL — ABNORMAL LOW (ref 8.9–10.3)
Chloride: 105 mmol/L (ref 98–111)
Creatinine, Ser: 1.17 mg/dL (ref 0.61–1.24)
GFR, Estimated: >60 mL/min (ref >60)
Glucose, Bld: 115 mg/dL — ABNORMAL HIGH (ref 70–99)
Potassium: 4.4 mmol/L (ref 3.5–5.1)
Sodium: 140 mmol/L (ref 135–145)

## 2022-04-05 LAB — SURGICAL PCR SCREEN
MRSA, PCR: NEGATIVE
Staphylococcus aureus: NEGATIVE

## 2022-04-05 LAB — MAGNESIUM: Magnesium: 2.3 mg/dL (ref 1.7–2.4)

## 2022-04-05 SURGERY — COLECTOMY, SIGMOID, ROBOT-ASSISTED
Anesthesia: General | Site: Abdomen

## 2022-04-05 MED ORDER — KETAMINE HCL 10 MG/ML IJ SOLN
INTRAMUSCULAR | Status: DC | PRN
  Administered 2022-04-05 (×2): 10 mg via INTRAVENOUS

## 2022-04-05 MED ORDER — PHENYLEPHRINE HCL-NACL 20-0.9 MG/250ML-% IV SOLN
INTRAVENOUS | Status: DC | PRN
  Administered 2022-04-05: 25 ug/min via INTRAVENOUS

## 2022-04-05 MED ORDER — INSULIN GLARGINE-YFGN 100 UNIT/ML ~~LOC~~ SOLN
8.0000 [IU] | Freq: Every day | SUBCUTANEOUS | Status: DC
  Administered 2022-04-05 – 2022-04-08 (×4): 8 [IU] via SUBCUTANEOUS
  Filled 2022-04-05 (×6): qty 0.08

## 2022-04-05 MED ORDER — LIDOCAINE HCL (CARDIAC) PF 100 MG/5ML IV SOSY
PREFILLED_SYRINGE | INTRAVENOUS | Status: DC | PRN
  Administered 2022-04-05: 60 mg via INTRAVENOUS

## 2022-04-05 MED ORDER — SODIUM CHLORIDE 0.9 % IV SOLN
INTRAVENOUS | Status: AC | PRN
  Administered 2022-04-05: 1000 mL

## 2022-04-05 MED ORDER — 0.9 % SODIUM CHLORIDE (POUR BTL) OPTIME
TOPICAL | Status: DC | PRN
  Administered 2022-04-05: 500 mL
  Administered 2022-04-05: 1000 mL

## 2022-04-05 MED ORDER — FENTANYL CITRATE (PF) 100 MCG/2ML IJ SOLN
INTRAMUSCULAR | Status: DC | PRN
  Administered 2022-04-05 (×2): 50 ug via INTRAVENOUS

## 2022-04-05 MED ORDER — ALBUTEROL SULFATE HFA 108 (90 BASE) MCG/ACT IN AERS
INHALATION_SPRAY | RESPIRATORY_TRACT | Status: DC | PRN
  Administered 2022-04-05 (×2): 4 via RESPIRATORY_TRACT

## 2022-04-05 MED ORDER — SODIUM CHLORIDE 0.9 % IV SOLN
INTRAVENOUS | Status: AC
  Filled 2022-04-05: qty 2

## 2022-04-05 MED ORDER — OXYCODONE HCL 5 MG PO TABS
5.0000 mg | ORAL_TABLET | ORAL | Status: DC | PRN
  Administered 2022-04-08: 5 mg via ORAL
  Filled 2022-04-05: qty 1

## 2022-04-05 MED ORDER — VISTASEAL 10 ML SINGLE DOSE KIT
PACK | CUTANEOUS | Status: DC | PRN
  Administered 2022-04-05: 10 mL via TOPICAL

## 2022-04-05 MED ORDER — CELECOXIB 200 MG PO CAPS
ORAL_CAPSULE | ORAL | Status: AC
  Administered 2022-04-05: 200 mg via ORAL
  Filled 2022-04-05: qty 1

## 2022-04-05 MED ORDER — ACETAMINOPHEN 500 MG PO TABS
1000.0000 mg | ORAL_TABLET | ORAL | Status: AC
  Administered 2022-04-05: 1000 mg via ORAL
  Filled 2022-04-05: qty 2

## 2022-04-05 MED ORDER — DEXTROSE 50 % IV SOLN
12.5000 g | INTRAVENOUS | Status: AC
  Administered 2022-04-05: 12.5 g via INTRAVENOUS

## 2022-04-05 MED ORDER — ROCURONIUM BROMIDE 10 MG/ML (PF) SYRINGE
PREFILLED_SYRINGE | INTRAVENOUS | Status: AC
  Filled 2022-04-05: qty 10

## 2022-04-05 MED ORDER — CELECOXIB 200 MG PO CAPS
200.0000 mg | ORAL_CAPSULE | ORAL | Status: AC
  Filled 2022-04-05: qty 1

## 2022-04-05 MED ORDER — SODIUM CHLORIDE (PF) 0.9 % IJ SOLN
INTRAMUSCULAR | Status: AC
  Filled 2022-04-05: qty 50

## 2022-04-05 MED ORDER — PROPOFOL 10 MG/ML IV BOLUS
INTRAVENOUS | Status: AC
  Filled 2022-04-05: qty 20

## 2022-04-05 MED ORDER — INDOCYANINE GREEN 25 MG IV SOLR
INTRAVENOUS | Status: DC | PRN
  Administered 2022-04-05: 2.5 mg via INTRAVENOUS
  Administered 2022-04-05: 5 mg via INTRAVENOUS

## 2022-04-05 MED ORDER — SODIUM CHLORIDE 0.9 % IV SOLN: INTRAVENOUS | Status: DC | PRN

## 2022-04-05 MED ORDER — DEXTROSE 50 % IV SOLN
12.5000 g | INTRAVENOUS | Status: AC
  Administered 2022-04-05: 12.5 g via INTRAVENOUS
  Filled 2022-04-05: qty 50

## 2022-04-05 MED ORDER — BUPIVACAINE-EPINEPHRINE (PF) 0.5% -1:200000 IJ SOLN
INTRAMUSCULAR | Status: DC | PRN
  Administered 2022-04-05: 30 mL

## 2022-04-05 MED ORDER — PHENYLEPHRINE HCL (PRESSORS) 10 MG/ML IV SOLN
INTRAVENOUS | Status: DC | PRN
  Administered 2022-04-05 (×4): 160 ug via INTRAVENOUS
  Administered 2022-04-05: 80 ug via INTRAVENOUS
  Administered 2022-04-05: 240 ug via INTRAVENOUS
  Administered 2022-04-05: 80 ug via INTRAVENOUS
  Administered 2022-04-05 (×2): 160 ug via INTRAVENOUS

## 2022-04-05 MED ORDER — ONDANSETRON HCL 4 MG/2ML IJ SOLN
INTRAMUSCULAR | Status: AC
  Filled 2022-04-05: qty 2

## 2022-04-05 MED ORDER — VISTASEAL 10 ML SINGLE DOSE KIT
PACK | CUTANEOUS | Status: AC
  Filled 2022-04-05: qty 10

## 2022-04-05 MED ORDER — DEXAMETHASONE SODIUM PHOSPHATE 10 MG/ML IJ SOLN
INTRAMUSCULAR | Status: AC
  Filled 2022-04-05: qty 1

## 2022-04-05 MED ORDER — KETAMINE HCL 50 MG/5ML IJ SOSY
PREFILLED_SYRINGE | INTRAMUSCULAR | Status: AC
  Filled 2022-04-05: qty 5

## 2022-04-05 MED ORDER — SODIUM CHLORIDE 0.9 % IV SOLN
2.0000 g | INTRAVENOUS | Status: AC
  Administered 2022-04-05 (×2): 2 g via INTRAVENOUS
  Filled 2022-04-05: qty 2

## 2022-04-05 MED ORDER — DEXTROSE 50 % IV SOLN
INTRAVENOUS | Status: AC
  Filled 2022-04-05: qty 50

## 2022-04-05 MED ORDER — PHENYLEPHRINE HCL-NACL 20-0.9 MG/250ML-% IV SOLN
INTRAVENOUS | Status: AC
  Filled 2022-04-05: qty 250

## 2022-04-05 MED ORDER — INSULIN GLARGINE-YFGN 100 UNIT/ML ~~LOC~~ SOLN
15.0000 [IU] | Freq: Every day | SUBCUTANEOUS | Status: DC
  Filled 2022-04-05: qty 0.15

## 2022-04-05 MED ORDER — ONDANSETRON HCL 4 MG/2ML IJ SOLN
INTRAMUSCULAR | Status: DC | PRN
  Administered 2022-04-05: 4 mg via INTRAVENOUS

## 2022-04-05 MED ORDER — SODIUM CHLORIDE 0.9 % IV SOLN
2.0000 g | Freq: Once | INTRAVENOUS | Status: DC
  Filled 2022-04-05: qty 2

## 2022-04-05 MED ORDER — FENTANYL CITRATE (PF) 100 MCG/2ML IJ SOLN: 25.0000 ug | INTRAMUSCULAR | Status: DC | PRN

## 2022-04-05 MED ORDER — EPHEDRINE SULFATE (PRESSORS) 50 MG/ML IJ SOLN
INTRAMUSCULAR | Status: DC | PRN
  Administered 2022-04-05 (×3): 5 mg via INTRAVENOUS

## 2022-04-05 MED ORDER — SODIUM CHLORIDE 0.9 % IV SOLN
INTRAVENOUS | Status: DC | PRN
  Administered 2022-04-05: 70 mL

## 2022-04-05 MED ORDER — INDOCYANINE GREEN 25 MG IV SOLR
5.0000 mg | Freq: Once | INTRAVENOUS | Status: DC
  Filled 2022-04-05: qty 10

## 2022-04-05 MED ORDER — ROCURONIUM BROMIDE 100 MG/10ML IV SOLN
INTRAVENOUS | Status: DC | PRN
  Administered 2022-04-05 (×2): 10 mg via INTRAVENOUS
  Administered 2022-04-05: 20 mg via INTRAVENOUS
  Administered 2022-04-05: 50 mg via INTRAVENOUS

## 2022-04-05 MED ORDER — TRAMADOL HCL 50 MG PO TABS: 50.0000 mg | ORAL_TABLET | Freq: Four times a day (QID) | ORAL | Status: DC | PRN

## 2022-04-05 MED ORDER — SUGAMMADEX SODIUM 200 MG/2ML IV SOLN
INTRAVENOUS | Status: DC | PRN
  Administered 2022-04-05: 200 mg via INTRAVENOUS

## 2022-04-05 MED ORDER — INSULIN GLARGINE-YFGN 100 UNIT/ML ~~LOC~~ SOLN
10.0000 [IU] | Freq: Every day | SUBCUTANEOUS | Status: DC
  Filled 2022-04-05: qty 0.1

## 2022-04-05 MED ORDER — IPRATROPIUM-ALBUTEROL 0.5-2.5 (3) MG/3ML IN SOLN
3.0000 mL | Freq: Once | RESPIRATORY_TRACT | Status: DC
  Administered 2022-04-05: 3 mL via RESPIRATORY_TRACT

## 2022-04-05 MED ORDER — BUPIVACAINE LIPOSOME 1.3 % IJ SUSP
INTRAMUSCULAR | Status: AC
  Filled 2022-04-05: qty 20

## 2022-04-05 MED ORDER — DEXAMETHASONE SODIUM PHOSPHATE 10 MG/ML IJ SOLN
INTRAMUSCULAR | Status: DC | PRN
  Administered 2022-04-05: 10 mg via INTRAVENOUS

## 2022-04-05 MED ORDER — CHLORHEXIDINE GLUCONATE CLOTH 2 % EX PADS
6.0000 | MEDICATED_PAD | Freq: Once | CUTANEOUS | Status: AC
  Administered 2022-04-05: 6 via TOPICAL

## 2022-04-05 MED ORDER — ONDANSETRON HCL 4 MG/2ML IJ SOLN: 4.0000 mg | Freq: Once | INTRAMUSCULAR | Status: DC | PRN

## 2022-04-05 MED ORDER — BUPIVACAINE-EPINEPHRINE (PF) 0.5% -1:200000 IJ SOLN
INTRAMUSCULAR | Status: AC
  Filled 2022-04-05: qty 30

## 2022-04-05 MED ORDER — CELECOXIB 200 MG PO CAPS
200.0000 mg | ORAL_CAPSULE | Freq: Two times a day (BID) | ORAL | Status: DC
  Administered 2022-04-06 – 2022-04-07 (×4): 200 mg via ORAL
  Filled 2022-04-05 (×6): qty 1

## 2022-04-05 MED ORDER — IPRATROPIUM-ALBUTEROL 0.5-2.5 (3) MG/3ML IN SOLN
RESPIRATORY_TRACT | Status: AC
  Filled 2022-04-05: qty 3

## 2022-04-05 MED ORDER — GABAPENTIN 300 MG PO CAPS
300.0000 mg | ORAL_CAPSULE | ORAL | Status: AC
  Administered 2022-04-05: 300 mg via ORAL
  Filled 2022-04-05: qty 1

## 2022-04-05 MED ORDER — LIDOCAINE HCL (PF) 2 % IJ SOLN
INTRAMUSCULAR | Status: AC
  Filled 2022-04-05: qty 5

## 2022-04-05 MED ORDER — FENTANYL CITRATE (PF) 100 MCG/2ML IJ SOLN
INTRAMUSCULAR | Status: AC
  Filled 2022-04-05: qty 2

## 2022-04-05 MED ORDER — GABAPENTIN 300 MG PO CAPS
300.0000 mg | ORAL_CAPSULE | Freq: Two times a day (BID) | ORAL | Status: DC
  Administered 2022-04-06 – 2022-04-08 (×5): 300 mg via ORAL
  Filled 2022-04-05 (×7): qty 1

## 2022-04-05 MED ORDER — PROPOFOL 10 MG/ML IV BOLUS
INTRAVENOUS | Status: DC | PRN
  Administered 2022-04-05: 80 mg via INTRAVENOUS

## 2022-04-05 SURGICAL SUPPLY — 80 items
APPLICATOR VISTASEAL 35 (MISCELLANEOUS) IMPLANT
BASIN KIT SINGLE STR (MISCELLANEOUS) ×1 IMPLANT
BLADE CLIPPER SURG (BLADE) ×1 IMPLANT
BLADE SURG SZ11 CARB STEEL (BLADE) ×1 IMPLANT
BULB RESERV EVAC DRAIN JP 100C (MISCELLANEOUS) IMPLANT
CANNULA REDUC XI 12-8 STAPL (CANNULA) ×1
CANNULA REDUCER 12-8 DVNC XI (CANNULA) ×1 IMPLANT
COVER TIP SHEARS 8 DVNC (MISCELLANEOUS) ×1 IMPLANT
COVER TIP SHEARS 8MM DA VINCI (MISCELLANEOUS) ×1
DRAIN CHANNEL JP 15F RND 16 (MISCELLANEOUS) IMPLANT
DRAPE ARM DVNC X/XI (DISPOSABLE) ×4 IMPLANT
DRAPE COLUMN DVNC XI (DISPOSABLE) ×1 IMPLANT
DRAPE DA VINCI XI ARM (DISPOSABLE) ×4
DRAPE DA VINCI XI COLUMN (DISPOSABLE) ×1
DRAPE LEGGINS SURG 28X43 STRL (DRAPES) ×1 IMPLANT
DRAPE UNDER BUTTOCK W/FLU (DRAPES) ×1 IMPLANT
DRSG OPSITE POSTOP 3X4 (GAUZE/BANDAGES/DRESSINGS) IMPLANT
DRSG OPSITE POSTOP 4X6 (GAUZE/BANDAGES/DRESSINGS) IMPLANT
ELECT REM PT RETURN 9FT ADLT (ELECTROSURGICAL) ×1
ELECTRODE REM PT RTRN 9FT ADLT (ELECTROSURGICAL) ×1 IMPLANT
GLOVE BIOGEL PI IND STRL 7.0 (GLOVE) ×3 IMPLANT
GLOVE SURG SYN 6.5 ES PF (GLOVE) ×4 IMPLANT
GLOVE SURG SYN 6.5 PF PI (GLOVE) ×3 IMPLANT
GOWN STRL REUS W/ TWL LRG LVL3 (GOWN DISPOSABLE) ×6 IMPLANT
GOWN STRL REUS W/TWL LRG LVL3 (GOWN DISPOSABLE) ×6
HANDLE YANKAUER SUCT BULB TIP (MISCELLANEOUS) ×1 IMPLANT
IRRIGATOR SUCT 8 DISP DVNC XI (IRRIGATION / IRRIGATOR) IMPLANT
IRRIGATOR SUCTION 8MM XI DISP (IRRIGATION / IRRIGATOR) ×2
IV NS 1000ML (IV SOLUTION) ×1
IV NS 1000ML BAXH (IV SOLUTION) IMPLANT
KIT TURNOVER CYSTO (KITS) ×1 IMPLANT
LABEL OR SOLS (LABEL) IMPLANT
MANIFOLD NEPTUNE II (INSTRUMENTS) ×1 IMPLANT
NDL INSUFFLATION 14GA 120MM (NEEDLE) ×1 IMPLANT
NEEDLE HYPO 22GX1.5 SAFETY (NEEDLE) ×1 IMPLANT
NEEDLE INSUFFLATION 14GA 120MM (NEEDLE) ×1 IMPLANT
OBTURATOR OPTICAL STANDARD 8MM (TROCAR) ×1
OBTURATOR OPTICAL STND 8 DVNC (TROCAR) ×1
OBTURATOR OPTICALSTD 8 DVNC (TROCAR) ×1 IMPLANT
PACK COLON CLEAN CLOSURE (MISCELLANEOUS) ×1 IMPLANT
PACK LAP CHOLECYSTECTOMY (MISCELLANEOUS) ×1 IMPLANT
PENCIL SMOKE EVACUATOR (MISCELLANEOUS) ×1 IMPLANT
PORT ACCESS TROCAR AIRSEAL 5 (TROCAR) ×1 IMPLANT
RELOAD STAPLE 60 3.5 BLU DVNC (STAPLE) IMPLANT
RELOAD STAPLER 3.5X60 BLU DVNC (STAPLE) ×5 IMPLANT
SCISSORS METZENBAUM CVD 33 (INSTRUMENTS) IMPLANT
SEAL CANN UNIV 5-8 DVNC XI (MISCELLANEOUS) ×3 IMPLANT
SEAL XI 5MM-8MM UNIVERSAL (MISCELLANEOUS) ×3
SET TRI-LUMEN FLTR TB AIRSEAL (TUBING) ×1 IMPLANT
SOL PREP PVP 2OZ (MISCELLANEOUS) ×1
SOLUTION ELECTROLUBE (MISCELLANEOUS) ×1 IMPLANT
SOLUTION PREP PVP 2OZ (MISCELLANEOUS) ×1 IMPLANT
SPONGE DRAIN TRACH 4X4 STRL 2S (GAUZE/BANDAGES/DRESSINGS) IMPLANT
SPONGE T-LAP 18X18 ~~LOC~~+RFID (SPONGE) ×1 IMPLANT
STAPLER 60 DA VINCI SURE FORM (STAPLE) ×1
STAPLER 60 SUREFORM DVNC (STAPLE) IMPLANT
STAPLER CANNULA SEAL DVNC XI (STAPLE) ×1 IMPLANT
STAPLER CANNULA SEAL XI (STAPLE) ×1
STAPLER CIRCULAR MANUAL XL 29 (STAPLE) IMPLANT
STAPLER RELOAD 3.5X60 BLU DVNC (STAPLE) ×5
STAPLER RELOAD 3.5X60 BLUE (STAPLE) ×5
STAPLER RELOADABLE 65 2-0 SUT (MISCELLANEOUS) IMPLANT
STAPLER SKIN PROX 35W (STAPLE) IMPLANT
STAPLER SYS INTERNAL RELOAD SS (MISCELLANEOUS) ×1 IMPLANT
SURGILUBE 2OZ TUBE FLIPTOP (MISCELLANEOUS) ×1 IMPLANT
SUT ETHILON 3-0 FS-10 30 BLK (SUTURE) ×2
SUT PDS AB 1 CT1 36 (SUTURE) ×2 IMPLANT
SUT VIC AB 3-0 SH 27 (SUTURE) ×2
SUT VIC AB 3-0 SH 27X BRD (SUTURE) ×1 IMPLANT
SUT VICRYL 0 AB UR-6 (SUTURE) IMPLANT
SUT VICRYL 0 UR6 27IN ABS (SUTURE) IMPLANT
SUTURE EHLN 3-0 FS-10 30 BLK (SUTURE) IMPLANT
SYR 30ML LL (SYRINGE) ×2 IMPLANT
SYS LAPSCP GELPORT 120MM (MISCELLANEOUS) ×1
SYS TROCAR 1.5-3 SLV ABD GEL (ENDOMECHANICALS) ×1
SYSTEM LAPSCP GELPORT 120MM (MISCELLANEOUS) IMPLANT
SYSTEM TROCR 1.5-3 SLV ABD GEL (ENDOMECHANICALS) ×1 IMPLANT
SYSTEM WECK SHIELD CLOSURE (TROCAR) IMPLANT
TRAY FOLEY MTR SLVR 16FR STAT (SET/KITS/TRAYS/PACK) ×1 IMPLANT
WATER STERILE IRR 500ML POUR (IV SOLUTION) ×1 IMPLANT

## 2022-04-05 NOTE — Progress Notes (Signed)
   04/05/22 0320  Provider Notification  Provider Name/Title NP [place]  Date Provider Notified 04/05/22  Time Provider Notified 0320  Method of Notification Page (Secure chat)  Notification Reason Other (Comment) (Hypoglycemia)  Provider response See new orders;Other (Comment) (Hypoglycemia protocol, decreased daily Lantus.)  Date of Provider Response 04/05/22  Time of Provider Response 0350   CBG rechecked, 115. NP updated/notified of recent CBG result.

## 2022-04-05 NOTE — Progress Notes (Signed)
Patient off the floor via bed for procedure.

## 2022-04-05 NOTE — Inpatient Diabetes Management (Signed)
Inpatient Diabetes Program Recommendations  AACE/ADA: New Consensus Statement on Inpatient Glycemic Control (2015)  Target Ranges:  Prepandial:   less than 140 mg/dL      Peak postprandial:   less than 180 mg/dL (1-2 hours)      Critically ill patients:  140 - 180 mg/dL   Lab Results  Component Value Date   GLUCAP 124 (H) 04/05/2022   HGBA1C 10.8 (H) 02/27/2022    Review of Glycemic Control  Latest Reference Range & Units 04/04/22 22:58 04/04/22 23:26 04/05/22 03:12 04/05/22 03:49 04/05/22 07:42 04/05/22 11:52 04/05/22 12:16  Glucose-Capillary 70 - 99 mg/dL 46 (L) 81 63 (L) 115 (H) 110 (H) 60 (L) 124 (H)  (L): Data is abnormally low (H): Data is abnormally high  Current orders for Inpatient glycemic control: Novolog 0-15 units Q4H Novolog 0-5 units QHS Semglee 8 units QD  Inpatient Diabetes Program Recommendations:    Please consider decreasing correction to: Novolog 0-9 units   Noted Semglee was decreased to 8 units QD due to hypoglycemia.    Will continue to follow while inpatient.  Thank you, Reche Dixon, MSN, [place] Diabetes Coordinator Inpatient Diabetes Program [phone] (team pager from 8a-5p)

## 2022-04-05 NOTE — Significant Event (Signed)
Brief note.  Full prog note to follow  Hospitalist consulted for medical management Noted hypoglycemia on 11/15 PM.  2/2 clear liquids most likely Will further decrease semglee to 10U.  Patient currently NPO for procedure Will continue q4h CBG  Ralene Muskrat MD

## 2022-04-05 NOTE — Progress Notes (Signed)
Hypoglycemic Event  CBG: 60  Treatment: 1/2 amp D 50  Symptoms: None  Follow-up CBG: Time:12:16 CBG Result:124  Possible Reasons for Event:  NPO for surgery  Comments/MD notified:    Valentine Barney,Erica W

## 2022-04-05 NOTE — Progress Notes (Signed)
PROGRESS NOTE    Micheal [name]  ZJI:[id number] DOB: [birth year] DOA: 04/02/2022 PCP: Howard Pouch, NP    Brief Narrative:  88 year old with history of COPD, CAD, CKD, DM 2, HTN, tubulovillous adenoma of colon tobacco use admitted for laparoscopic colon resection.  Medical team consulted for medical management especially with blood glucose.    Assessment & Plan:   Active Problems:   DM (diabetes mellitus), type 2, uncontrolled, with hyperosmolarity ([place])   Acute renal failure superimposed on stage 3b chronic kidney disease (HCC)   Thrombocytopenia (HCC)   CAD (coronary artery disease)   COPD (chronic obstructive pulmonary disease) (HCC)   Erosive gastritis   HTN (hypertension)   Alcohol abuse  DM (diabetes mellitus), type 2, uncontrolled, with hyperosmolarity ([place]) Poorly controlled last A1c is 10.8.   Patient's home medication Lantus 16 units daily, glipizide 2.5 mg twice daily, Trijardy. Sugars labile Hypoglycemia noted 11/15-16 Plan: Decrease semglee to 8U daily SSI q4h while NPO   Acute renal failure superimposed on stage 3b chronic kidney disease (HCC) Improving over interval Hold losartan Continue gentle IVF Repeat creat in AM Consider restarting ARB if Cr improved  COPD (chronic obstructive pulmonary disease) (HCC) Mild abnormal breath sounds Breath sounds improved.   Air movement coarse but no obvious wheezing.   Patient on room air.  Continue bronchodilator regimen.     Thrombocytopenia ([place]) Appears to be chronic.   No active signs of bleeding.  Continue to monitor.   Okay for chemo DVT prophylaxis if necessary postoperatively depending on surgical course   Colon mass, tubulovillous adenoma - Suspicion for malignancy.   Admitted under general surgery during this admission for laparoscopic resection.   Management per surgery   CAD (coronary artery disease) No chest pain.  On Toprol-XL     Erosive gastritis PPI twice daily   HTN  (hypertension) Continue Toprol-XL.  IV as needed ordered   Remote Hx of EtOH abuse, currently no signs of withdrawal.   Thank you for this consult.  [facility] hospitalist service to continue following patient with you.  Recommendations discussed with primary attending Dr. Lysle Pearl.  DVT prophylaxis: Per general surgery Code Status: Full Family Communication: None Disposition Plan: Per general surgery Level of care: Med-Surg  Consultants:  Hospitalist  Procedures:  None  Antimicrobials: Flagyl Erythromycin   Subjective: Seen and examined.  Sitting up in bed.  No apparent distress.  No complaints of pain.  Objective: Vitals:   04/04/22 1910 04/04/22 2305 04/05/22 0317 04/05/22 0745  BP: (!) 112/48 (!) 158/58 135/79 (!) 110/50  Pulse: 78 (!) 59 76 76  Resp: '20 16 16 18  '$ Temp: (!) 96.5 F (35.8 C) (!) 96.1 F (35.6 C) 97.6 F (36.4 C) (!) 97.5 F (36.4 C)  TempSrc: Axillary Axillary Oral Oral  SpO2: 100% 97% 99% 98%  Weight:      Height:        Intake/Output Summary (Last 24 hours) at 04/05/2022 1112 Last data filed at 04/05/2022 1015 Gross per 24 hour  Intake 3191.7 ml  Output --  Net 3191.7 ml   Filed Weights   04/02/22 2038  Weight: 60.1 kg    Examination:  General exam: NAD Respiratory system: Coarse breath sounds.  Air entry equal.  RA Cardiovascular system: S1-S2, RRR, no murmurs, no pedal edema Gastrointestinal system: Thin, soft, NT/ND, normal bowel sounds Central nervous system: Alert and oriented. No focal neurological deficits. Extremities: Symmetric 5 x 5 power. Skin: No rashes, lesions or ulcers Psychiatry: Judgement and  insight appear normal. Mood & affect appropriate.     Data Reviewed: I have personally reviewed following labs and imaging studies  CBC: Recent Labs  Lab 04/02/22 2022 04/03/22 0929  WBC 10.9* 8.8  HGB 11.2* 11.2*  HCT 35.1* 35.0*  MCV 97.0 97.0  PLT 116* 614*   Basic Metabolic Panel: Recent Labs  Lab 04/02/22 2022  04/03/22 0929 04/04/22 0433 04/05/22 0538  NA 139 139 136 140  K 4.6 4.5 4.2 4.4  CL 100 100 101 105  CO2 32 32 30 26  GLUCOSE 154* 246* 200* 115*  BUN 40* 35* 40* 30*  CREATININE 1.29* 1.33* 1.36* 1.17  CALCIUM 8.5* 8.8* 8.2* 8.5*  MG 1.7  --  1.8 2.3  PHOS 3.0  --   --   --    GFR: Estimated Creatinine Clearance: 37.8 mL/min (by C-G formula based on SCr of 1.17 mg/dL). Liver Function Tests: Recent Labs  Lab 04/02/22 2022  AST 17  ALT 15  ALKPHOS 61  BILITOT 0.6  PROT 6.6  ALBUMIN 3.2*   No results for input(s): "LIPASE", "AMYLASE" in the last 168 hours. No results for input(s): "AMMONIA" in the last 168 hours. Coagulation Profile: No results for input(s): "INR", "PROTIME" in the last 168 hours. Cardiac Enzymes: No results for input(s): "CKTOTAL", "CKMB", "CKMBINDEX", "TROPONINI" in the last 168 hours. BNP (last 3 results) No results for input(s): "PROBNP" in the last 8760 hours. HbA1C: No results for input(s): "HGBA1C" in the last 72 hours. CBG: Recent Labs  Lab 04/04/22 2258 04/04/22 2326 04/05/22 0312 04/05/22 0349 04/05/22 0742  GLUCAP 46* 81 63* 115* 110*   Lipid Profile: No results for input(s): "CHOL", "HDL", "LDLCALC", "TRIG", "CHOLHDL", "LDLDIRECT" in the last 72 hours. Thyroid Function Tests: No results for input(s): "TSH", "T4TOTAL", "FREET4", "T3FREE", "THYROIDAB" in the last 72 hours. Anemia Panel: No results for input(s): "VITAMINB12", "FOLATE", "FERRITIN", "TIBC", "IRON", "RETICCTPCT" in the last 72 hours. Sepsis Labs: No results for input(s): "PROCALCITON", "LATICACIDVEN" in the last 168 hours.  Recent Results (from the past 240 hour(s))  Surgical PCR screen     Status: None   Collection Time: 04/04/22 10:42 PM   Specimen: Nasal Mucosa; Nasal Swab  Result Value Ref Range Status   MRSA, PCR NEGATIVE NEGATIVE Final   Staphylococcus aureus NEGATIVE NEGATIVE Final    Comment: (NOTE) The Xpert SA Assay (FDA approved for NASAL specimens in  patients 101 years of age and older), is one component of a comprehensive surveillance program. It is not intended to diagnose infection nor to guide or monitor treatment. Performed at [facility], [address] [place], [place] [address]          Radiology Studies: No results found.      Scheduled Meds:  acetaminophen  1,000 mg Oral On Call to OR   celecoxib  200 mg Oral On Call to OR   dextrose  25 g Intravenous STAT   gabapentin  300 mg Oral On Call to OR   insulin aspart  0-15 Units Subcutaneous Q4H   insulin glargine-yfgn  8 Units Subcutaneous Daily   lipase/protease/amylase  36,000 Units Oral TID AC   metoprolol succinate  50 mg Oral Daily   multivitamin with minerals  1 tablet Oral Daily   nicotine  21 mg Transdermal Daily   pantoprazole  40 mg Oral BID AC   triamcinolone ointment   Topical BID   Continuous Infusions:  sodium chloride 60 mL/hr at 04/05/22 0101  cefoTEtan (CEFOTAN) IV       LOS: 3 days    Sidney Ace, MD [facility]   If 7PM-7AM, please contact night-coverage  04/05/2022, 11:12 AM

## 2022-04-05 NOTE — Interval H&P Note (Signed)
No complaints today. Glycemic control maximize during inpatient. Stay for the past couple days. All additional questions and concerns addressed. Patient and son at bedside voice understanding for the purpose of this procedure. Still wishes to proceed.

## 2022-04-05 NOTE — Anesthesia Preprocedure Evaluation (Addendum)
Anesthesia Evaluation  Patient identified by MRN, date of birth, ID band Patient awake    Reviewed: Allergy & Precautions, NPO status , Patient's Chart, lab work & pertinent test results  Airway Mallampati: II  TM Distance: >3 FB Neck ROM: full    Dental  (+) Edentulous Upper, Missing, Partial Lower   Pulmonary shortness of breath, COPD,  COPD inhaler, Patient abstained from smoking., former smoker   Pulmonary exam normal  + decreased breath sounds      Cardiovascular Exercise Tolerance: Poor hypertension, Pt. on medications + CAD, + Past MI and + Cardiac Stents  Normal cardiovascular exam Rhythm:Regular Rate:Normal     Neuro/Psych negative neurological ROS  negative psych ROS   GI/Hepatic negative GI ROS, Neg liver ROS, PUD,GERD  Medicated,,  Endo/Other  diabetes, Poorly Controlled, Type 2, Oral Hypoglycemic Agents    Renal/GU   negative genitourinary   Musculoskeletal negative musculoskeletal ROS (+)    Abdominal Normal abdominal exam  (+)   Peds negative pediatric ROS (+)  Hematology negative hematology ROS (+)   Anesthesia Other Findings Past Medical History: No date: Balance problem     Comment:  INTERMITTENT WITH NEGATIVE WORKUP No date: Chronic kidney disease, stage 3b (HCC) No date: Colonic mass No date: COPD (chronic obstructive pulmonary disease) (HCC) No date: Coronary artery disease No date: Diabetes mellitus without complication (HCC) No date: DM (diabetes mellitus), type 2, uncontrolled, with  hyperosmolarity (HCC) No date: Erosive gastritis No date: GERD (gastroesophageal reflux disease) No date: Hypercholesteremia No date: Hypertension No date: Myocardial infarction [facility])     Comment:  2005 No date: Pneumonia     Comment:  RECENT 5/17 No date: Protein-calorie malnutrition ([place]) No date: Shortness of breath dyspnea No date: Thrombocytopenia ([place]) No date: Wheezing  Past Surgical  History: No date: APPENDECTOMY No date: CARDIAC CATHETERIZATION 10/04/2015: CATARACT EXTRACTION W/PHACO; Right     Comment:  Procedure: CATARACT EXTRACTION PHACO AND INTRAOCULAR               LENS PLACEMENT (IOC);  Surgeon: [name], MD;                Location: [facility] ORS;  Service: Ophthalmology;  Laterality:              Right;  [place] 02:06 AP% 17.9 CDE 22.65 fluid pack lot #               [id number] 12/13/2015: CATARACT EXTRACTION W/PHACO; Left     Comment:  Procedure: CATARACT EXTRACTION PHACO AND INTRAOCULAR               LENS PLACEMENT (IOC);  Surgeon: [name], MD;                Location: [facility] ORS;  Service: Ophthalmology;  Laterality:              Left;  [place] 1.39 AP% 25.3 CDE 26.37 Fluid pack lot #               [id number] No date: COLONOSCOPY 03/01/2022: COLONOSCOPY WITH PROPOFOL; N/A     Comment:  Procedure: COLONOSCOPY WITH PROPOFOL;  Surgeon: Lin Landsman, MD;  Location: [facility] ENDOSCOPY;  Service:               Gastroenterology;  Laterality: N/A; No date: CORONARY ANGIOPLASTY     Comment:  Stent placement 03/01/2022: ESOPHAGOGASTRODUODENOSCOPY; N/A  Comment:  Procedure: ESOPHAGOGASTRODUODENOSCOPY (EGD);  Surgeon:               Lin Landsman, MD;  Location: [facility] ENDOSCOPY;                Service: Gastroenterology;  Laterality: N/A; 03/30/2019: LAPAROTOMY; N/A     Comment:  Procedure: EXPLORATORY LAPAROTOMY, POSSIBLE BOWEL               RESECTION,;  Surgeon: Olean Ree, MD;  Location: [facility]               ORS;  Service: General;  Laterality: N/A; 01/10/2018: ORIF ANKLE FRACTURE; Left     Comment:  Procedure: OPEN REDUCTION INTERNAL FIXATION (ORIF) ANKLE              FRACTURE;  Surgeon: Dereck Leep, MD;  Location: [facility]               ORS;  Service: Orthopedics;  Laterality: Left;  BMI    Body Mass Index: 20.74 kg/m      Reproductive/Obstetrics negative OB ROS                              Anesthesia  Physical Anesthesia Plan  ASA: 3  Anesthesia Plan: General   Post-op Pain Management:    Induction: Intravenous  PONV Risk Score and Plan: Ondansetron, Dexamethasone, Midazolam and Treatment may vary due to age or medical condition  Airway Management Planned: Oral ETT  Additional Equipment:   Intra-op Plan:   Post-operative Plan: Extubation in OR  Informed Consent: I have reviewed the patients History and Physical, chart, labs and discussed the procedure including the risks, benefits and alternatives for the proposed anesthesia with the patient or authorized representative who has indicated his/her understanding and acceptance.     Dental Advisory Given  Plan Discussed with: CRNA and Surgeon  Anesthesia Plan Comments:         Anesthesia Quick Evaluation

## 2022-04-05 NOTE — Care Management Important Message (Signed)
Important Message  Patient Details  Name: Micheal [name] MRN: [id number] Date of Birth: [birth year]   Medicare Important Message Given:  N/A - LOS <3 / Initial given by admissions     Dannette Barbara 04/05/2022, 12:14 PM

## 2022-04-05 NOTE — Progress Notes (Signed)
Report given PACU, advised writer to give scheduled metoprolol by mouth. Will administer tylenol '1000mg'$ , celecoxib '200mg'$  and gabapentin by mouth, on call to OR. Pacu you will administer cefotetan IVPB in OR. Consent for robot assisted sigmund not sign nursing staff attempted, however, patient stated he had questions, will send unsigned consent down to OR.

## 2022-04-05 NOTE — Transfer of Care (Signed)
Immediate Anesthesia Transfer of Care Note  Patient: Micheal [name]  Procedure(s) Performed: XI ROBOT ASSISTED SIGMOID COLECTOMY (Abdomen)  Patient Location: PACU  Anesthesia Type:General  Level of Consciousness: drowsy  Airway & Oxygen Therapy: Patient Spontanous Breathing and Patient connected to face mask oxygen  Post-op Assessment: Report given to RN  Post vital signs: stable  Last Vitals:  Vitals Value Taken Time  BP 131/58 04/05/22 2003  Temp    Pulse 72 04/05/22 2008  Resp 16 04/05/22 2008  SpO2 100 % 04/05/22 2008  Vitals shown include unvalidated device data.  Last Pain:  Vitals:   04/05/22 1219  TempSrc: Temporal  PainSc: 0-No pain         Complications: No notable events documented.

## 2022-04-05 NOTE — Progress Notes (Addendum)
       CROSS COVER NOTE  NAME: Micheal [name] MRN: [id number] DOB : 1932/06/05 ATTENDING PHYSICIAN: Micheal Sprague, DO    Date of Service   04/05/2022   HPI/Events of Note   Notified of two hypoglycemic events overnight. CBG 46 at 2258 and 63 at 0312. Micheal [name] has labile blood glucose readings.   Interventions   Assessment/Plan:  Treat per hypoglycemia protocol Decrease basal insulin by 25% while NPO-->15U Semglee daily Consider dextrose containing fluids while NPO if hypoglycemic at next CBG check    This document was prepared using Dragon voice recognition software and may include unintentional dictation errors.  Neomia Glass DNP, MBA, FNP-BC Nurse Practitioner [facility] Pager [phone]

## 2022-04-05 NOTE — Anesthesia Procedure Notes (Signed)
Procedure Name: Intubation Date/Time: 04/05/2022 1:22 PM  Performed by: Johnna Acosta, CRNAPre-anesthesia Checklist: Patient identified, Emergency Drugs available, Suction available, Patient being monitored and Timeout performed Patient Re-evaluated:Patient Re-evaluated prior to induction Oxygen Delivery Method: Circle system utilized Preoxygenation: Pre-oxygenation with 100% oxygen Induction Type: IV induction Ventilation: Mask ventilation without difficulty and Oral airway inserted - appropriate to patient size Laryngoscope Size: McGraph and 4 Grade View: Grade I Tube type: Oral Tube size: 7.0 mm Number of attempts: 1 Airway Equipment and Method: Stylet and Video-laryngoscopy Placement Confirmation: ETT inserted through vocal cords under direct vision, positive ETCO2 and breath sounds checked- equal and bilateral Secured at: 22 cm Tube secured with: Tape Dental Injury: Teeth and Oropharynx as per pre-operative assessment

## 2022-04-06 ENCOUNTER — Other Ambulatory Visit: Payer: Self-pay

## 2022-04-06 DIAGNOSIS — E118 Type 2 diabetes mellitus with unspecified complications: Secondary | ICD-10-CM

## 2022-04-06 DIAGNOSIS — Z794 Long term (current) use of insulin: Secondary | ICD-10-CM | POA: Diagnosis not present

## 2022-04-06 LAB — BASIC METABOLIC PANEL
Anion gap: 5 (ref 5–15)
Anion gap: 5 (ref 5–15)
Anion gap: 7 (ref 5–15)
BUN: 27 mg/dL — ABNORMAL HIGH (ref 8–23)
BUN: 33 mg/dL — ABNORMAL HIGH (ref 8–23)
BUN: 31 mg/dL — ABNORMAL HIGH (ref 8–23)
CO2: 29 mmol/L (ref 22–32)
CO2: 28 mmol/L (ref 22–32)
CO2: 24 mmol/L (ref 22–32)
Calcium: 7.9 mg/dL — ABNORMAL LOW (ref 8.9–10.3)
Calcium: 7.7 mg/dL — ABNORMAL LOW (ref 8.9–10.3)
Calcium: 7.4 mg/dL — ABNORMAL LOW (ref 8.9–10.3)
Chloride: 110 mmol/L (ref 98–111)
Chloride: 110 mmol/L (ref 98–111)
Chloride: 108 mmol/L (ref 98–111)
Creatinine, Ser: 1.42 mg/dL — ABNORMAL HIGH (ref 0.61–1.24)
Creatinine, Ser: 1.69 mg/dL — ABNORMAL HIGH (ref 0.61–1.24)
Creatinine, Ser: 1.7 mg/dL — ABNORMAL HIGH (ref 0.61–1.24)
GFR, Estimated: 48 mL/min — ABNORMAL LOW (ref >60)
GFR, Estimated: 39 mL/min — ABNORMAL LOW (ref >60)
GFR, Estimated: 39 mL/min — ABNORMAL LOW (ref >60)
Glucose, Bld: 133 mg/dL — ABNORMAL HIGH (ref 70–99)
Glucose, Bld: 95 mg/dL (ref 70–99)
Glucose, Bld: 203 mg/dL — ABNORMAL HIGH (ref 70–99)
Potassium: 4.9 mmol/L (ref 3.5–5.1)
Potassium: 6 mmol/L — ABNORMAL HIGH (ref 3.5–5.1)
Potassium: 4.8 mmol/L (ref 3.5–5.1)
Sodium: 144 mmol/L (ref 135–145)
Sodium: 143 mmol/L (ref 135–145)
Sodium: 139 mmol/L (ref 135–145)

## 2022-04-06 LAB — MAGNESIUM: Magnesium: 2.2 mg/dL (ref 1.7–2.4)

## 2022-04-06 LAB — CBC WITH DIFFERENTIAL/PLATELET
Abs Immature Granulocytes: 0.06 10*3/uL (ref 0.00–0.07)
Abs Immature Granulocytes: 0.15 10*3/uL — ABNORMAL HIGH (ref 0.00–0.07)
Basophils Absolute: 0.1 10*3/uL (ref 0.0–0.1)
Basophils Absolute: 0.1 10*3/uL (ref 0.0–0.1)
Basophils Relative: 0 %
Basophils Relative: 0 %
Eosinophils Absolute: 0 10*3/uL (ref 0.0–0.5)
Eosinophils Absolute: 0 10*3/uL (ref 0.0–0.5)
Eosinophils Relative: 0 %
Eosinophils Relative: 0 %
HCT: 33.7 % — ABNORMAL LOW (ref 39.0–52.0)
HCT: 34.2 % — ABNORMAL LOW (ref 39.0–52.0)
Hemoglobin: 10.4 g/dL — ABNORMAL LOW (ref 13.0–17.0)
Hemoglobin: 10.3 g/dL — ABNORMAL LOW (ref 13.0–17.0)
Immature Granulocytes: 0 %
Immature Granulocytes: 1 %
Lymphocytes Relative: 11 %
Lymphocytes Relative: 11 %
Lymphs Abs: 2.2 10*3/uL (ref 0.7–4.0)
Lymphs Abs: 2.2 10*3/uL (ref 0.7–4.0)
MCH: 31 pg (ref 26.0–34.0)
MCH: 30.5 pg (ref 26.0–34.0)
MCHC: 30.9 g/dL (ref 30.0–36.0)
MCHC: 30.1 g/dL (ref 30.0–36.0)
MCV: 100.6 fL — ABNORMAL HIGH (ref 80.0–100.0)
MCV: 101.2 fL — ABNORMAL HIGH (ref 80.0–100.0)
Monocytes Absolute: 1.1 10*3/uL — ABNORMAL HIGH (ref 0.1–1.0)
Monocytes Absolute: 1.3 10*3/uL — ABNORMAL HIGH (ref 0.1–1.0)
Monocytes Relative: 5 %
Monocytes Relative: 7 %
Neutro Abs: 17.1 10*3/uL — ABNORMAL HIGH (ref 1.7–7.7)
Neutro Abs: 16.7 10*3/uL — ABNORMAL HIGH (ref 1.7–7.7)
Neutrophils Relative %: 84 %
Neutrophils Relative %: 81 %
Platelets: 115 10*3/uL — ABNORMAL LOW (ref 150–400)
Platelets: 99 10*3/uL — ABNORMAL LOW (ref 150–400)
RBC: 3.35 MIL/uL — ABNORMAL LOW (ref 4.22–5.81)
RBC: 3.38 MIL/uL — ABNORMAL LOW (ref 4.22–5.81)
RDW: 16.9 % — ABNORMAL HIGH (ref 11.5–15.5)
RDW: 16.9 % — ABNORMAL HIGH (ref 11.5–15.5)
WBC: 20.5 10*3/uL — ABNORMAL HIGH (ref 4.0–10.5)
WBC: 20.4 10*3/uL — ABNORMAL HIGH (ref 4.0–10.5)
nRBC: 0 % (ref 0.0–0.2)
nRBC: 0 % (ref 0.0–0.2)

## 2022-04-06 LAB — CBC
HCT: 36.4 % — ABNORMAL LOW (ref 39.0–52.0)
Hemoglobin: 11.2 g/dL — ABNORMAL LOW (ref 13.0–17.0)
MCH: 30.7 pg (ref 26.0–34.0)
MCHC: 30.8 g/dL (ref 30.0–36.0)
MCV: 99.7 fL (ref 80.0–100.0)
Platelets: 96 10*3/uL — ABNORMAL LOW (ref 150–400)
RBC: 3.65 MIL/uL — ABNORMAL LOW (ref 4.22–5.81)
RDW: 16.8 % — ABNORMAL HIGH (ref 11.5–15.5)
WBC: 19.9 10*3/uL — ABNORMAL HIGH (ref 4.0–10.5)
nRBC: 0 % (ref 0.0–0.2)

## 2022-04-06 LAB — LACTIC ACID, PLASMA
Lactic Acid, Venous: 2.8 mmol/L (ref 0.5–1.9)
Lactic Acid, Venous: 2.5 mmol/L (ref 0.5–1.9)
Lactic Acid, Venous: 2 mmol/L (ref 0.5–1.9)

## 2022-04-06 LAB — GLUCOSE, CAPILLARY
Glucose-Capillary: 119 mg/dL — ABNORMAL HIGH (ref 70–99)
Glucose-Capillary: 119 mg/dL — ABNORMAL HIGH (ref 70–99)
Glucose-Capillary: 125 mg/dL — ABNORMAL HIGH (ref 70–99)
Glucose-Capillary: 141 mg/dL — ABNORMAL HIGH (ref 70–99)
Glucose-Capillary: 265 mg/dL — ABNORMAL HIGH (ref 70–99)

## 2022-04-06 MED ORDER — ASPIRIN 81 MG PO TBEC
81.0000 mg | DELAYED_RELEASE_TABLET | Freq: Every day | ORAL | Status: DC
  Administered 2022-04-07 – 2022-04-08 (×2): 81 mg via ORAL
  Filled 2022-04-06 (×2): qty 1

## 2022-04-06 MED ORDER — SODIUM ZIRCONIUM CYCLOSILICATE 10 G PO PACK
10.0000 g | PACK | Freq: Once | ORAL | Status: AC
  Administered 2022-04-06: 10 g via ORAL
  Filled 2022-04-06: qty 1

## 2022-04-06 MED ORDER — ATORVASTATIN CALCIUM 20 MG PO TABS
40.0000 mg | ORAL_TABLET | Freq: Every day | ORAL | Status: DC
  Administered 2022-04-07 – 2022-04-08 (×2): 40 mg via ORAL
  Filled 2022-04-06 (×2): qty 2

## 2022-04-06 MED ORDER — DEXTROSE 50 % IV SOLN
1.0000 | Freq: Once | INTRAVENOUS | Status: AC
  Administered 2022-04-06: 50 mL via INTRAVENOUS
  Filled 2022-04-06: qty 50

## 2022-04-06 MED ORDER — SODIUM CHLORIDE 0.9 % IV BOLUS
1000.0000 mL | Freq: Once | INTRAVENOUS | Status: AC
  Administered 2022-04-06: 1000 mL via INTRAVENOUS

## 2022-04-06 MED ORDER — INSULIN ASPART 100 UNIT/ML IJ SOLN
0.0000 [IU] | Freq: Three times a day (TID) | INTRAMUSCULAR | Status: DC
  Administered 2022-04-06 (×2): 1 [IU] via SUBCUTANEOUS
  Administered 2022-04-07 (×2): 2 [IU] via SUBCUTANEOUS
  Administered 2022-04-08: 3 [IU] via SUBCUTANEOUS
  Administered 2022-04-08: 2 [IU] via SUBCUTANEOUS
  Filled 2022-04-06 (×6): qty 1

## 2022-04-06 MED ORDER — INSULIN ASPART 100 UNIT/ML IV SOLN
10.0000 [IU] | Freq: Once | INTRAVENOUS | Status: AC
  Administered 2022-04-06: 10 [IU] via INTRAVENOUS
  Filled 2022-04-06: qty 0.1

## 2022-04-06 NOTE — Progress Notes (Signed)
PROGRESS NOTE    Micheal [name]  RKY:[id number] DOB: Aug 28, 1932 DOA: 04/02/2022 PCP: Howard Pouch, NP    Brief Narrative:  88 year old with history of COPD, CAD, CKD, DM 2, HTN, tubulovillous adenoma of colon tobacco use admitted for laparoscopic colon resection.  Medical team consulted for medical management especially with blood glucose.    Assessment & Plan:   Active Problems:   DM (diabetes mellitus), type 2, uncontrolled, with hyperosmolarity ([place])   Acute renal failure superimposed on stage 3b chronic kidney disease (HCC)   Thrombocytopenia (HCC)   CAD (coronary artery disease)   COPD (chronic obstructive pulmonary disease) (HCC)   Erosive gastritis   HTN (hypertension)   Alcohol abuse  DM (diabetes mellitus), type 2, uncontrolled, with hyperosmolarity ([place]) Poorly controlled last A1c is 10.8.   Patient's home medication Lantus 16 units daily, glipizide 2.5 mg twice daily, Trijardy. Sugars labile Hypoglycemia noted 11/15-16 S/p surgery on 11/16 Plan: Decrease semglee to 8U daily Sensitive SSI q4h for now Will need assessment of 24 insulin requirement prior to determining changes in home regimen before discharge Recs discussed with Dr. Lysle Pearl   Acute renal failure superimposed on stage 3b chronic kidney disease ([place]) Worse post operatively Hold losartan Continue gentle IVF Repeat creat in AM At this time, BP does not allow for restarting ARB  COPD (chronic obstructive pulmonary disease) (HCC) Mild abnormal breath sounds Breath sounds improved.   Air movement coarse but no obvious wheezing.   Patient on room air.  Continue prn bronchodilator regimen.     Thrombocytopenia ([place]) Appears to be chronic.   No active signs of bleeding.  Continue to monitor.   Okay for chemo DVT prophylaxis if necessary postoperatively depending on surgical course   Colon mass, tubulovillous adenoma - Suspicion for malignancy.   Admitted under general surgery during this  admission for laparoscopic resection.   Management per surgery Dw Dr. Lysle Pearl   CAD (coronary artery disease) No chest pain.  On Toprol-XL     Erosive gastritis PPI twice daily   HTN (hypertension) Continue Toprol-XL.  IV as needed ordered   Remote Hx of EtOH abuse, currently no signs of withdrawal.   Thank you for this consult.  [facility] hospitalist service to continue following patient with you.  Recommendations discussed with primary attending Dr. Lysle Pearl.  DVT prophylaxis: Per general surgery Code Status: Full Family Communication: None Disposition Plan: Per general surgery Level of care: Med-Surg  Consultants:  Hospitalist  Procedures:  None  Antimicrobials: Flagyl Erythromycin   Subjective: Seen and examined.  Sitting up in bed.  No apparent distress.  No complaints of pain.  Objective: Vitals:   04/05/22 2251 04/06/22 0337 04/06/22 0839 04/06/22 0900  BP: (!) 117/57 100/64 (!) 91/43 (!) 106/58  Pulse: 70 81 74   Resp: '20 16 18   '$ Temp:  (!) 97.1 F (36.2 C) 97.7 F (36.5 C)   TempSrc:  Axillary Oral   SpO2: 100% 93% 92%   Weight:      Height:        Intake/Output Summary (Last 24 hours) at 04/06/2022 1217 Last data filed at 04/06/2022 1000 Gross per 24 hour  Intake 2780 ml  Output 1382 ml  Net 1398 ml   Filed Weights   04/02/22 2038 04/05/22 1219  Weight: 60.1 kg 60.1 kg    Examination:  General exam: NAD.  Appears fatigued Respiratory system: Coarse breath sounds.  Air entry equal.  Room air Cardiovascular system: S1-S2, RRR, no murmurs, no pedal  edema Gastrointestinal system: Thin, soft, NT/ND, normal bowel sounds Central nervous system: Alert and oriented. No focal neurological deficits. Extremities: Symmetric 5 x 5 power. Skin: No rashes, lesions or ulcers Psychiatry: Judgement and insight appear normal. Mood & affect appropriate.     Data Reviewed: I have personally reviewed following labs and imaging studies  CBC: Recent Labs  Lab  04/02/22 2022 04/03/22 0929 04/06/22 0415  WBC 10.9* 8.8 19.9*  HGB 11.2* 11.2* 11.2*  HCT 35.1* 35.0* 36.4*  MCV 97.0 97.0 99.7  PLT 116* 113* 96*   Basic Metabolic Panel: Recent Labs  Lab 04/02/22 2022 04/03/22 0929 04/04/22 0433 04/05/22 0538 04/06/22 0415  NA 139 139 136 140 144  K 4.6 4.5 4.2 4.4 4.9  CL 100 100 101 105 110  CO2 32 32 '30 26 29  '$ GLUCOSE 154* 246* 200* 115* 133*  BUN 40* 35* 40* 30* 27*  CREATININE 1.29* 1.33* 1.36* 1.17 1.42*  CALCIUM 8.5* 8.8* 8.2* 8.5* 7.9*  MG 1.7  --  1.8 2.3 2.2  PHOS 3.0  --   --   --   --    GFR: Estimated Creatinine Clearance: 31.2 mL/min (A) (by C-G formula based on SCr of 1.42 mg/dL (H)). Liver Function Tests: Recent Labs  Lab 04/02/22 2022  AST 17  ALT 15  ALKPHOS 61  BILITOT 0.6  PROT 6.6  ALBUMIN 3.2*   No results for input(s): "LIPASE", "AMYLASE" in the last 168 hours. No results for input(s): "AMMONIA" in the last 168 hours. Coagulation Profile: No results for input(s): "INR", "PROTIME" in the last 168 hours. Cardiac Enzymes: No results for input(s): "CKTOTAL", "CKMB", "CKMBINDEX", "TROPONINI" in the last 168 hours. BNP (last 3 results) No results for input(s): "PROBNP" in the last 8760 hours. HbA1C: No results for input(s): "HGBA1C" in the last 72 hours. CBG: Recent Labs  Lab 04/05/22 2006 04/05/22 2314 04/06/22 0334 04/06/22 0805 04/06/22 1146  GLUCAP 94 120* 119* 119* 125*   Lipid Profile: No results for input(s): "CHOL", "HDL", "LDLCALC", "TRIG", "CHOLHDL", "LDLDIRECT" in the last 72 hours. Thyroid Function Tests: No results for input(s): "TSH", "T4TOTAL", "FREET4", "T3FREE", "THYROIDAB" in the last 72 hours. Anemia Panel: No results for input(s): "VITAMINB12", "FOLATE", "FERRITIN", "TIBC", "IRON", "RETICCTPCT" in the last 72 hours. Sepsis Labs: No results for input(s): "PROCALCITON", "LATICACIDVEN" in the last 168 hours.  Recent Results (from the past 240 hour(s))  Surgical PCR screen      Status: None   Collection Time: 04/04/22 10:42 PM   Specimen: Nasal Mucosa; Nasal Swab  Result Value Ref Range Status   MRSA, PCR NEGATIVE NEGATIVE Final   Staphylococcus aureus NEGATIVE NEGATIVE Final    Comment: (NOTE) The Xpert SA Assay (FDA approved for NASAL specimens in patients 73 years of age and older), is one component of a comprehensive surveillance program. It is not intended to diagnose infection nor to guide or monitor treatment. Performed at [facility], [address], [place], [place] [address]          Radiology Studies: No results found.      Scheduled Meds:  [START ON 04/07/2022] aspirin EC  81 mg Oral Daily   [START ON 04/07/2022] atorvastatin  40 mg Oral Daily   celecoxib  200 mg Oral BID   gabapentin  300 mg Oral BID   insulin aspart  0-9 Units Subcutaneous TID WC   insulin glargine-yfgn  8 Units Subcutaneous Daily   lipase/protease/amylase  36,000 Units Oral TID AC  metoprolol succinate  50 mg Oral Daily   multivitamin with minerals  1 tablet Oral Daily   nicotine  21 mg Transdermal Daily   pantoprazole  40 mg Oral BID AC   triamcinolone ointment   Topical BID   Continuous Infusions:  sodium chloride 60 mL/hr at 04/06/22 0758     LOS: 4 days    Sidney Ace, MD [facility]   If 7PM-7AM, please contact night-coverage  04/06/2022, 12:17 PM

## 2022-04-06 NOTE — Progress Notes (Signed)
Subjective:  CC: Micheal [name] is a 88 y.o. male  Hospital stay day 4, 1 Day Post-Op robotic lap sigmoidectomy for unresectable tubulovillous adenoma  HPI: No acute issues overnight. No reported flatus or BM  ROS:  General: Denies weight loss, weight gain, fatigue, fevers, chills, and night sweats. Heart: Denies chest pain, palpitations, racing heart, irregular heartbeat, leg pain or swelling, and decreased activity tolerance. Respiratory: Denies breathing difficulty, shortness of breath, wheezing, cough, and sputum. GI: Denies change in appetite, heartburn, nausea, vomiting, constipation, diarrhea, and blood in stool. GU: Denies difficulty urinating, pain with urinating, urgency, frequency, blood in urine.   Objective:   Temp:  [97.1 F (36.2 C)-98.1 F (36.7 C)] 97.1 F (36.2 C) (11/17 0337) Pulse Rate:  [70-81] 81 (11/17 0337) Resp:  [12-23] 16 (11/17 0337) BP: (100-131)/(44-81) 100/64 (11/17 0337) SpO2:  [93 %-100 %] 93 % (11/17 0337) Weight:  [60.1 kg] 60.1 kg (11/16 1219)     Height: '5\' 7"'$  (170.2 cm) Weight: 60.1 kg BMI (Calculated): 20.73   Intake/Output this shift:   Intake/Output Summary (Last 24 hours) at 04/06/2022 8850 Last data filed at 04/06/2022 0300 Gross per 24 hour  Intake 2975.1 ml  Output 1132 ml  Net 1843.1 ml    Constitutional :  alert, cooperative, appears stated age, and no distress  Respiratory:  clear to auscultation bilaterally  Cardiovascular:  regular rate and rhythm  Gastrointestinal: soft, non-tender; bowel sounds normal; no masses,  no organomegaly.   Skin: Cool and moist. Incision dressing c/d/I.  JP pulled, not holding suction  Psychiatric: Normal affect, non-agitated, not confused       LABS:     Latest Ref Rng & Units 04/06/2022    4:15 AM 04/05/2022    5:38 AM 04/04/2022    4:33 AM  CMP  Glucose 70 - 99 mg/dL 133  115  200   BUN 8 - 23 mg/dL 27  30  40   Creatinine 0.61 - 1.24 mg/dL 1.42  1.17  1.36   Sodium 135 - 145  mmol/L 144  140  136   Potassium 3.5 - 5.1 mmol/L 4.9  4.4  4.2   Chloride 98 - 111 mmol/L 110  105  101   CO2 22 - 32 mmol/L '29  26  30   '$ Calcium 8.9 - 10.3 mg/dL 7.9  8.5  8.2       Latest Ref Rng & Units 04/06/2022    4:15 AM 04/03/2022    9:29 AM 04/02/2022    8:22 PM  CBC  WBC 4.0 - 10.5 K/uL 19.9  8.8  10.9   Hemoglobin 13.0 - 17.0 g/dL 11.2  11.2  11.2   Hematocrit 39.0 - 52.0 % 36.4  35.0  35.1   Platelets 150 - 400 K/uL 96  113  116     RADS: N/a Assessment:   S/p robotic lap sigmoidectomy for unresectable tubulovillous adenoma.  Pain well controlled.  JP removed since no longer holding suction.  Area covered with gauze.  Will continue to monitor return of flatus or BM prior to resuming diet.  Ok to have sips and ice chips, gum and hard candy.  labs/images/medications/previous chart entries reviewed personally and relevant changes/updates noted above.

## 2022-04-06 NOTE — Op Note (Addendum)
Preoperative diagnosis: Unresectable colon polyp Postoperative diagnosis: Same  Procedure: Robotic assisted laparoscopic sigmoidectomy. Rigid sigmoidoscopy   Anesthesia: GETA   Surgeon: Benjamine Sprague Assistant: Piscoya for bedside assist for EEA anastomosis   Wound Classification: clean contaminated   Specimen: Sigmoid colon   Complications: None   Estimated Blood Loss: 100 mL  Indications:  Please see H&P for further details.     FIndings: 1.  Successful EEA anastomosis with no air leak  2.  Extensive interloop and abdominal wall adhesions secondary to previous surgery 3.  No visualization of inked portion of unresectable polyp.  Please see body for further details.  Description of procedure: The patient was placed on the operating table in the low lithotomy position, both arms tucked. General anesthesia was induced.  Foley placed. A time-out was completed verifying correct patient, procedure, site, positioning, and implant(s) and/or special equipment prior to beginning this procedure. The abdomen was prepped and draped in the usual sterile fashion.    Veress needle inserted at palmer's point and after two click and saline droptest, insufflation started up to 28m Hg without issue. Needle removed and 833mport placed through same spot via optiview technique.  No injuries noted during insertion.  4 additional ports were then placed along the right abdominal wall, one 127mort in RLQ, two 8 mm ports and 5mm50mrseal assist port.  Exparel infused as a tap block.     Inspection of the abdomen noted significant intraloop adhesions and adhesions to the left lateral abdominal wall from previous surgery.  Approximately 2 hours of the 5-hour surgery was contributed to extensive lysis of adhesions in order to visualize the target anatomy.  Care was noted to make sure no bowel injuries were present and thermal cautery was done away from visible bowel.    Eventually the lining flexure was visualized  and inspected but unfortunately the tattoo ink marking area of unresectable polyp was not able to be visualized through the serosa.  Lateral attachments were taken down using sharp dissection.  This was carried up to the splenic flexure and again no additional ink was noted.  Colonoscopy procedure note was reviewed Intra-Op to confirm polyp location documented at 35 cm from the anus within the sigmoid colon.  Pictures were noted within the procedure note to confirm location as well.  Additional dissection into the extraperitoneal rectum or splaying of the tinea was carried out, and again no ink was noted.  Decision was made at this point to proceed with a short segment sigmoid colon resection in the area where the adhesions were most dense to see if the adenoma can be found.   Area proximal to the area where there were dense adhesions chosen for anvil insertion site.  Vessel sealer then used to transect the mesentery at from this point down to the rectum.   ICG was infused and adequate circulation was noted at the planned staple lines.  60 mm blue load load stapler was used to transect proximal end.  Another 60 mm green load stapler was then used to transect the colon at the rectum.  Robot undocked and incision made through previous midline and dissection carried down and through fascia to enter abdominal cavity.  Wound protector placed and specimen removed from abdomen.  Specimen was opened outside of the sterile field to inspect the lumen and unfortunately no evidence of the circumferential mass was noted.  1 incidental polyp and what look like a lipoma was noted within the specimen.  This  was passed off field pending pathology.  Decision made at this point to proceed with a rigid sigmoidoscopy to see if the polyp can be identified.  Rigid sigmoidoscope measuring 20 cm was prepped and inserted into the rectum under direct visualization.  Insufflation started through the limited visualization of the rigid  sigmoidoscope, no obvious polyp was able to be identified.  Robot redocked and a second look inspection of the proximal colon up to the splenic flexure and visible distal transverse colon again performed and no evidence of ink was noted.  Reinspection of the rectal stump fortunately noted a pinpoint area of what looked like black ink on the posterior aspect of the dissected stump.  Approximately 5 additional centimeters of colon removed from the stump, passed off the sterile field.  Inspection of the lumen within the specimen noted portions of what looked like the tubulovillous friable mucosa towards the distal staple line, similar in appearance to the photo and the colonoscopy procedure note.  ICG infused to confirm adequate circulation at both staple ends prior to proceeding with anastomosis.  Robot undocked.  Ports removed.  I scrubbed back in and proximal colon pulled through incision and purse string device used to transect end, and 73m EEA anvil placed through hole and tied down.  1 last inspection of this proximal end did not note any obvious area of ink.  Edges cleaned and placed back in abdominal cavity.  EEA stapler placed through the rectal stump by Dr. PEula Flaxand guided to the distal staple line.  End to end anastomosis created, after confirming no twisting of the proximal mesentery and no tension noted on the proximal colon.  2 donuts within the stapler were intact.  Saline was infused into the pelvis, and a air leak test was performed and anastomosis did not show any leaks.  All saline was then suctioned out, anastomosis looked viable, and hemostasis was noted throughout the abdomen.  Vistaseal infused around the staple line for additional security.   14French round drain placed in the area due to the extensive adhesions that required dissection causing some bleeding throughout the procedure.  Wound protector removed.   Clean closure protocol initiated.  Midline incision site closed with 1 PDS  x2, both poseterior peritoneum and anterior fascia.  0vicryl used to close fascial defect at 168mRLQ port site under direct visualization.  All sites then closed with staples.  The patient tolerated the procedure well, awakened from anesthesia and was taken to the postanesthesia care unit in satisfactory condition with Foley in place.  Sponge count correct at end of procedure.

## 2022-04-06 NOTE — Significant Event (Addendum)
Notified by bedside RN regarding hypotension BP 82/47 Per RN abdominal incisions intact, no worsening abd pain  Labs: Worsening kidney function Hyperkalemia Elevated WBC, mild downtrend in Hb LA 2.8  Findings discussed with primary attending Dr. Lysle Pearl Expected slow downtrend in Hb after colon surgery  Recs: 1L NS bolus Frequent BP monitoring Increase maintenance fluids to 100cc/hr Recheck LA in 2-3 hours Repeat full labs in AM Low threshold for transfer to higher level of care  Ralene Muskrat MD  Update: Repeat LA downtrending, but still elevated at 2.5 Continue to trend LA q3h Increase rate of IVF to 125cc/hr Repeat CBC and BMP at 2000

## 2022-04-06 NOTE — Plan of Care (Signed)
  Problem: Clinical Measurements: Goal: Ability to maintain clinical measurements within normal limits will improve Outcome: Progressing   Problem: Clinical Measurements: Goal: Will remain free from infection Outcome: Progressing   Problem: Clinical Measurements: Goal: Diagnostic test results will improve Outcome: Progressing   Problem: Clinical Measurements: Goal: Respiratory complications will improve Outcome: Progressing   Problem: Activity: Goal: Risk for activity intolerance will decrease Outcome: Progressing   Problem: Nutrition: Goal: Adequate nutrition will be maintained Outcome: Progressing

## 2022-04-06 NOTE — Anesthesia Postprocedure Evaluation (Signed)
Anesthesia Post Note  Patient: NED KAKAR  Procedure(s) Performed: XI ROBOT ASSISTED SIGMOID COLECTOMY (Abdomen)  Patient location during evaluation: PACU Anesthesia Type: General Level of consciousness: awake and alert Pain management: pain level controlled Vital Signs Assessment: post-procedure vital signs reviewed and stable Respiratory status: spontaneous breathing, nonlabored ventilation, respiratory function stable and patient connected to nasal cannula oxygen Cardiovascular status: blood pressure returned to baseline and stable Postop Assessment: no apparent nausea or vomiting Anesthetic complications: no   No notable events documented.   Last Vitals:  Vitals:   04/05/22 2251 04/06/22 0337  BP: (!) 117/57 100/64  Pulse: 70 81  Resp: 20 16  Temp:  (!) 36.2 C  SpO2: 100% 93%    Last Pain:  Vitals:   04/06/22 0337  TempSrc: Axillary  PainSc:                  Arita Miss

## 2022-04-07 DIAGNOSIS — N179 Acute kidney failure, unspecified: Secondary | ICD-10-CM

## 2022-04-07 DIAGNOSIS — N1832 Chronic kidney disease, stage 3b: Secondary | ICD-10-CM

## 2022-04-07 DIAGNOSIS — I9589 Other hypotension: Secondary | ICD-10-CM | POA: Diagnosis not present

## 2022-04-07 DIAGNOSIS — D696 Thrombocytopenia, unspecified: Secondary | ICD-10-CM | POA: Diagnosis not present

## 2022-04-07 DIAGNOSIS — E1100 Type 2 diabetes mellitus with hyperosmolarity without nonketotic hyperglycemic-hyperosmolar coma (NKHHC): Secondary | ICD-10-CM | POA: Diagnosis not present

## 2022-04-07 LAB — BASIC METABOLIC PANEL
Anion gap: 5 (ref 5–15)
BUN: 34 mg/dL — ABNORMAL HIGH (ref 8–23)
CO2: 25 mmol/L (ref 22–32)
Calcium: 7.2 mg/dL — ABNORMAL LOW (ref 8.9–10.3)
Chloride: 110 mmol/L (ref 98–111)
Creatinine, Ser: 1.77 mg/dL — ABNORMAL HIGH (ref 0.61–1.24)
GFR, Estimated: 37 mL/min — ABNORMAL LOW (ref >60)
Glucose, Bld: 151 mg/dL — ABNORMAL HIGH (ref 70–99)
Potassium: 4.8 mmol/L (ref 3.5–5.1)
Sodium: 140 mmol/L (ref 135–145)

## 2022-04-07 LAB — CBC
HCT: 32.6 % — ABNORMAL LOW (ref 39.0–52.0)
Hemoglobin: 9.8 g/dL — ABNORMAL LOW (ref 13.0–17.0)
MCH: 30.7 pg (ref 26.0–34.0)
MCHC: 30.1 g/dL (ref 30.0–36.0)
MCV: 102.2 fL — ABNORMAL HIGH (ref 80.0–100.0)
Platelets: 96 10*3/uL — ABNORMAL LOW (ref 150–400)
RBC: 3.19 MIL/uL — ABNORMAL LOW (ref 4.22–5.81)
RDW: 16.9 % — ABNORMAL HIGH (ref 11.5–15.5)
WBC: 16.7 10*3/uL — ABNORMAL HIGH (ref 4.0–10.5)
nRBC: 0 % (ref 0.0–0.2)

## 2022-04-07 LAB — MAGNESIUM: Magnesium: 2 mg/dL (ref 1.7–2.4)

## 2022-04-07 LAB — GLUCOSE, CAPILLARY
Glucose-Capillary: 99 mg/dL (ref 70–99)
Glucose-Capillary: 151 mg/dL — ABNORMAL HIGH (ref 70–99)
Glucose-Capillary: 190 mg/dL — ABNORMAL HIGH (ref 70–99)
Glucose-Capillary: 124 mg/dL — ABNORMAL HIGH (ref 70–99)

## 2022-04-07 LAB — LACTIC ACID, PLASMA: Lactic Acid, Venous: 1.4 mmol/L (ref 0.5–1.9)

## 2022-04-07 LAB — PROCALCITONIN: Procalcitonin: 0.27 ng/mL

## 2022-04-07 MED ORDER — PIPERACILLIN-TAZOBACTAM 3.375 G IVPB
3.3750 g | Freq: Three times a day (TID) | INTRAVENOUS | Status: DC
  Administered 2022-04-07 – 2022-04-10 (×7): 3.375 g via INTRAVENOUS
  Filled 2022-04-07 (×7): qty 50

## 2022-04-07 MED ORDER — MIDODRINE HCL 5 MG PO TABS
5.0000 mg | ORAL_TABLET | Freq: Three times a day (TID) | ORAL | Status: DC
  Administered 2022-04-07 – 2022-04-08 (×3): 5 mg via ORAL
  Filled 2022-04-07 (×3): qty 1

## 2022-04-07 MED ORDER — SODIUM CHLORIDE 0.9 % IV BOLUS
500.0000 mL | Freq: Once | INTRAVENOUS | Status: AC
  Administered 2022-04-07: 500 mL via INTRAVENOUS

## 2022-04-07 NOTE — Progress Notes (Signed)
Subjective:  CC: Micheal [name] is a 88 y.o. 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  Recorded bowel movements yesterday as well.  Patient has minimal complaints of pain today.  However, has persistent borderline low blood pressure despite aggressive fluid resuscitation.  ROS:  General: Denies weight loss, weight gain, fatigue, fevers, chills, and night sweats. Heart: Denies chest pain, palpitations, racing heart, irregular heartbeat, leg pain or swelling, and decreased activity tolerance. Respiratory: Denies breathing difficulty, shortness of breath, wheezing, cough, and sputum. GI: Denies change in appetite, heartburn, nausea, vomiting, constipation, diarrhea, and blood in stool. GU: Denies difficulty urinating, pain with urinating, urgency, frequency, blood in urine.   Objective:   Temp:  [96.4 F (35.8 C)-98 F (36.7 C)] 97.7 F (36.5 C) (11/18 0751) Pulse Rate:  [61-73] 69 (11/18 0751) Resp:  [16-20] 20 (11/18 0751) BP: (82-96)/(40-58) 92/48 (11/18 1028) SpO2:  [93 %-98 %] 95 % (11/18 0751)     Height: '5\' 7"'$  (170.2 cm) Weight: 60.1 kg BMI (Calculated): 20.73   Intake/Output this shift:   Intake/Output Summary (Last 24 hours) at 04/07/2022 1322 Last data filed at 04/07/2022 3875 Gross per 24 hour  Intake 2333.79 ml  Output --  Net 2333.79 ml    Constitutional :  alert, cooperative, appears stated age, and no distress  Respiratory:  clear to auscultation bilaterally  Cardiovascular:  regular rate and rhythm  Gastrointestinal: Soft, no guarding, slightly increased distention very mild tenderness to palpation right along infraumbilical extraction site as expected .   Skin: Cool and moist. Incision dressing c/d/I.  JP pulled, not holding suction  Psychiatric: Normal affect, non-agitated, not confused       LABS:     Latest Ref Rng & Units 04/07/2022    4:14 AM  04/06/2022    7:39 PM 04/06/2022    2:44 PM  CMP  Glucose 70 - 99 mg/dL 151  203  95   BUN 8 - 23 mg/dL 34  31  33   Creatinine 0.61 - 1.24 mg/dL 1.77  1.70  1.69   Sodium 135 - 145 mmol/L 140  139  143   Potassium 3.5 - 5.1 mmol/L 4.8  4.8  6.0   Chloride 98 - 111 mmol/L 110  108  110   CO2 22 - 32 mmol/L '25  24  28   '$ Calcium 8.9 - 10.3 mg/dL 7.2  7.4  7.7       Latest Ref Rng & Units 04/07/2022    4:14 AM 04/06/2022    7:39 PM 04/06/2022    2:44 PM  CBC  WBC 4.0 - 10.5 K/uL 16.7  20.4  20.5   Hemoglobin 13.0 - 17.0 g/dL 9.8  10.3  10.4   Hematocrit 39.0 - 52.0 % 32.6  34.2  33.7   Platelets 150 - 400 K/uL 96  99  115     RADS: N/a Assessment:   S/p robotic lap sigmoidectomy for unresectable tubulovillous adenoma.  Tolerating clear liquid.  No complaints has persistent borderline hypotension.  Will stay on clears until hypotension resolves, appreciate hospitalist recs.  Hemoglobin slowly drifting but rate of decreased likely hemodilutional rather than true blood loss especially in the absence of any significant abdominal pain  labs/images/medications/previous chart entries reviewed personally and relevant changes/updates noted above.

## 2022-04-07 NOTE — Progress Notes (Signed)
Pharmacy Antibiotic Note  Micheal [name] is a 88 y.o. male admitted on 04/02/2022 with sepsis. Source unclear. Hypotension, elevated WBC. CrCl > 20 mL/min. Pharmacy has been consulted for Zosyn dosing.  Plan: Zosyn 3.375 grams every 8 hours (4 hour infusion)  Height: '5\' 7"'$  (170.2 cm) Weight: 60.1 kg (132 lb 6.4 oz) IBW/kg (Calculated) : 66.1  Temp (24hrs), Avg:97.5 F (36.4 C), Min:96.4 F (35.8 C), Max:97.8 F (36.6 C)  Recent Labs  Lab 04/03/22 0929 04/04/22 0433 04/05/22 0538 04/06/22 0415 04/06/22 1444 04/06/22 1719 04/06/22 1939 04/07/22 0414 04/07/22 0851  WBC 8.8  --   --  19.9* 20.5*  --  20.4* 16.7*  --   CREATININE 1.33*   < > 1.17 1.42* 1.69*  --  1.70* 1.77*  --   LATICACIDVEN  --   --   --   --  2.8* 2.5* 2.0*  --  1.4   < > = values in this interval not displayed.    Estimated Creatinine Clearance: 25 mL/min (A) (by C-G formula based on SCr of 1.77 mg/dL (H)).    No Known Allergies  Antimicrobials this admission: erythyromycin 11/15 >> 11/15 cefotetan 11/16 >> 11/16 Metronidazole 11/15 >> 11/15 Pip/tazo 11/18 >>  Dose adjustments this admission: N/a  Microbiology results: 11/18 BCx: ordered 11/18 MRSA PCR: negative  Thank you for allowing pharmacy to be a part of this patient's care.  Wynelle Cleveland 04/07/2022 4:58 PM

## 2022-04-07 NOTE — Progress Notes (Signed)
PROGRESS NOTE    LEGACY LACIVITA  OMV:[id number] DOB: [birth year] DOA: 04/02/2022 PCP: Howard Pouch, NP    Brief Narrative:  88 year old with history of COPD, CAD, CKD, DM 2, HTN, tubulovillous adenoma of colon tobacco use admitted on 11/13 for laparoscopic colon resection, which was done 11/16.  Medical team consulted for medical management especially with blood glucose.  Hospitalist complicated by hypotension on the evening of 11/17.  Treated with IV fluids with noted lactic acidosis, white blood cell count.  Hypotension persisting into 11/18 and mildly elevated procalcitonin and patient started on IV Zosyn 11/18    Assessment & Plan:   Active Problems:   DM (diabetes mellitus), type 2, uncontrolled, with hyperosmolarity ([place])   Acute renal failure superimposed on stage 3b chronic kidney disease (HCC)   Thrombocytopenia (HCC)   CAD (coronary artery disease)   COPD (chronic obstructive pulmonary disease) (HCC)   Erosive gastritis   HTN (hypertension)   Alcohol abuse  Infection causing hypotension Likely infectious based causing hypotension.  Sepsis has been ruled out since does not meet criteria with basic SIRS criteria.  Aggressively fluid resuscitating and have started Zosyn.  Blood cultures pending.  If patient does not have significant improvement, will check CT of abdomen pelvis tomorrow.  Currently using midodrine for blood pressure  DM (diabetes mellitus), type 2, uncontrolled, with hyperosmolarity (HCC) Poorly controlled last A1c is 10.8.   Patient's home medication Lantus 16 units daily, glipizide 2.5 mg twice daily, Trijardy. Sugars labile.  Some episodes of hypoglycemia.  With adjustments in long-acting insulin, CBGs are better.   Acute renal failure superimposed on stage 3b chronic kidney disease ([place]) Worsening creatinine likely due to hypotension and infection as above.  COPD (chronic obstructive pulmonary disease) (HCC) Mild abnormal breath sounds Breath sounds  improved.   Air movement coarse but no obvious wheezing.   Patient on room air.  Continue prn bronchodilator regimen.     Thrombocytopenia ([place]) Appears to be chronic.   No active signs of bleeding.  Continue to monitor.   Okay for chemo DVT prophylaxis if necessary postoperatively depending on surgical course   Colon mass, tubulovillous adenoma - Suspicion for malignancy.   Admitted under general surgery during this admission for laparoscopic resection.   Management per surgery Dw Dr. Lysle Pearl   CAD (coronary artery disease) No chest pain.  Normally on Toprol, held secondary to hypotension.     Erosive gastritis PPI twice daily   HTN (hypertension) Initially on Toprol, held secondary to hypotension   Remote Hx of EtOH abuse, currently no signs of withdrawal.   Thank you for this consult.  [facility] hospitalist service to continue following patient with you.  Recommendations discussed with primary attending Dr. Lysle Pearl.  DVT prophylaxis: Per general surgery Code Status: Full Family Communication: None Disposition Plan: Per general surgery Level of care: Med-Surg  Consultants:  Hospitalist  Procedures:  Status post laparoscopic colonic resection  Antimicrobials: Flagyl Erythromycin Now on Zosyn 11/18-present   Subjective: Resting comfortably, no acute distress  Objective: Vitals:   04/07/22 0441 04/07/22 0751 04/07/22 1028 04/07/22 1426  BP: (!) 91/45 (!) 92/47 (!) 92/48 (!) 99/49  Pulse: 66 69  71  Resp: '16 20  18  '$ Temp: 97.6 F (36.4 C) 97.7 F (36.5 C)  97.8 F (36.6 C)  TempSrc: Oral Oral    SpO2: 96% 95%  98%  Weight:      Height:        Intake/Output Summary (Last 24 hours) at 04/07/2022  [place] filed at 04/07/2022 1349 Gross per 24 hour  Intake 2305 ml  Output --  Net 2305 ml    Filed Weights   04/02/22 2038 04/05/22 1219  Weight: 60.1 kg 60.1 kg    Examination:  General exam: No acute distress Respiratory system: Clear to  auscultation bilaterally Cardiovascular system: Regular rate and rhythm, S1-S2 Gastrointestinal system: Status post colonic resection Central nervous system: Alert and oriented. No focal neurological deficits. Extremities: No clubbing or tendinosis or edema Skin: No rashes, lesions or ulcers Psychiatry: Appropriate, no evidence of psychoses    Data Reviewed: I have personally reviewed following labs and imaging studies Mildly elevated procalcitonin, worsening creatinine, lactic acid level 1.4, white blood cell count of 16.7 CBC: Recent Labs  Lab 04/03/22 0929 04/06/22 0415 04/06/22 1444 04/06/22 1939 04/07/22 0414  WBC 8.8 19.9* 20.5* 20.4* 16.7*  NEUTROABS  --   --  17.1* 16.7*  --   HGB 11.2* 11.2* 10.4* 10.3* 9.8*  HCT 35.0* 36.4* 33.7* 34.2* 32.6*  MCV 97.0 99.7 100.6* 101.2* 102.2*  PLT 113* 96* 115* 99* 96*    Basic Metabolic Panel: Recent Labs  Lab 04/02/22 2022 04/03/22 0929 04/04/22 0433 04/05/22 0538 04/06/22 0415 04/06/22 1444 04/06/22 1939 04/07/22 0414  NA 139   < > 136 140 144 143 139 140  K 4.6   < > 4.2 4.4 4.9 6.0* 4.8 4.8  CL 100   < > 101 105 110 110 108 110  CO2 32   < > '30 26 29 28 24 25  '$ GLUCOSE 154*   < > 200* 115* 133* 95 203* 151*  BUN 40*   < > 40* 30* 27* 33* 31* 34*  CREATININE 1.29*   < > 1.36* 1.17 1.42* 1.69* 1.70* 1.77*  CALCIUM 8.5*   < > 8.2* 8.5* 7.9* 7.7* 7.4* 7.2*  MG 1.7  --  1.8 2.3 2.2  --   --  2.0  PHOS 3.0  --   --   --   --   --   --   --    < > = values in this interval not displayed.    GFR: Estimated Creatinine Clearance: 25 mL/min (A) (by C-G formula based on SCr of 1.77 mg/dL (H)). Liver Function Tests: Recent Labs  Lab 04/02/22 2022  AST 17  ALT 15  ALKPHOS 61  BILITOT 0.6  PROT 6.6  ALBUMIN 3.2*    No results for input(s): "LIPASE", "AMYLASE" in the last 168 hours. No results for input(s): "AMMONIA" in the last 168 hours. Coagulation Profile: No results for input(s): "INR", "PROTIME" in the last  168 hours. Cardiac Enzymes: No results for input(s): "CKTOTAL", "CKMB", "CKMBINDEX", "TROPONINI" in the last 168 hours. BNP (last 3 results) No results for input(s): "PROBNP" in the last 8760 hours. HbA1C: No results for input(s): "HGBA1C" in the last 72 hours. CBG: Recent Labs  Lab 04/06/22 1601 04/06/22 2100 04/07/22 0745 04/07/22 1124 04/07/22 1608  GLUCAP 141* 265* 99 151* 190*    Lipid Profile: No results for input(s): "CHOL", "HDL", "LDLCALC", "TRIG", "CHOLHDL", "LDLDIRECT" in the last 72 hours. Thyroid Function Tests: No results for input(s): "TSH", "T4TOTAL", "FREET4", "T3FREE", "THYROIDAB" in the last 72 hours. Anemia Panel: No results for input(s): "VITAMINB12", "FOLATE", "FERRITIN", "TIBC", "IRON", "RETICCTPCT" in the last 72 hours. Sepsis Labs: Recent Labs  Lab 04/06/22 1444 04/06/22 1719 04/06/22 1939 04/07/22 0414 04/07/22 0851  PROCALCITON  --   --   --  0.27  --  LATICACIDVEN 2.8* 2.5* 2.0*  --  1.4    Recent Results (from the past 240 hour(s))  Surgical PCR screen     Status: None   Collection Time: 04/04/22 10:42 PM   Specimen: Nasal Mucosa; Nasal Swab  Result Value Ref Range Status   MRSA, PCR NEGATIVE NEGATIVE Final   Staphylococcus aureus NEGATIVE NEGATIVE Final    Comment: (NOTE) The Xpert SA Assay (FDA approved for NASAL specimens in patients 70 years of age and older), is one component of a comprehensive surveillance program. It is not intended to diagnose infection nor to guide or monitor treatment. Performed at [facility], [address], [place], [place] [address]          Radiology Studies: No results found.      Scheduled Meds:  aspirin EC  81 mg Oral Daily   atorvastatin  40 mg Oral Daily   celecoxib  200 mg Oral BID   gabapentin  300 mg Oral BID   insulin aspart  0-9 Units Subcutaneous TID WC   insulin glargine-yfgn  8 Units Subcutaneous Daily   lipase/protease/amylase  36,000 Units Oral TID AC    midodrine  5 mg Oral TID WC   multivitamin with minerals  1 tablet Oral Daily   nicotine  21 mg Transdermal Daily   pantoprazole  40 mg Oral BID AC   triamcinolone ointment   Topical BID   Continuous Infusions:  sodium chloride 125 mL/hr at 04/07/22 1020     LOS: 5 days    Annita Brod, MD [facility]   If 7PM-7AM, please contact night-coverage  04/07/2022, 4:46 PM

## 2022-04-08 ENCOUNTER — Inpatient Hospital Stay: Payer: Medicare PPO

## 2022-04-08 DIAGNOSIS — I959 Hypotension, unspecified: Secondary | ICD-10-CM | POA: Diagnosis not present

## 2022-04-08 DIAGNOSIS — E1100 Type 2 diabetes mellitus with hyperosmolarity without nonketotic hyperglycemic-hyperosmolar coma (NKHHC): Secondary | ICD-10-CM | POA: Diagnosis not present

## 2022-04-08 DIAGNOSIS — I509 Heart failure, unspecified: Secondary | ICD-10-CM | POA: Diagnosis not present

## 2022-04-08 DIAGNOSIS — N179 Acute kidney failure, unspecified: Secondary | ICD-10-CM | POA: Diagnosis not present

## 2022-04-08 LAB — CBC
HCT: 36.6 % — ABNORMAL LOW (ref 39.0–52.0)
Hemoglobin: 10.9 g/dL — ABNORMAL LOW (ref 13.0–17.0)
MCH: 30.6 pg (ref 26.0–34.0)
MCHC: 29.8 g/dL — ABNORMAL LOW (ref 30.0–36.0)
MCV: 102.8 fL — ABNORMAL HIGH (ref 80.0–100.0)
Platelets: 84 10*3/uL — ABNORMAL LOW (ref 150–400)
RBC: 3.56 MIL/uL — ABNORMAL LOW (ref 4.22–5.81)
RDW: 16.6 % — ABNORMAL HIGH (ref 11.5–15.5)
WBC: 14.5 10*3/uL — ABNORMAL HIGH (ref 4.0–10.5)
nRBC: 0 % (ref 0.0–0.2)

## 2022-04-08 LAB — COMPREHENSIVE METABOLIC PANEL
ALT: 10 U/L (ref 0–44)
AST: 13 U/L — ABNORMAL LOW (ref 15–41)
Albumin: 2.5 g/dL — ABNORMAL LOW (ref 3.5–5.0)
Alkaline Phosphatase: 54 U/L (ref 38–126)
Anion gap: 3 — ABNORMAL LOW (ref 5–15)
BUN: 32 mg/dL — ABNORMAL HIGH (ref 8–23)
CO2: 23 mmol/L (ref 22–32)
Calcium: 7.3 mg/dL — ABNORMAL LOW (ref 8.9–10.3)
Chloride: 110 mmol/L (ref 98–111)
Creatinine, Ser: 1.84 mg/dL — ABNORMAL HIGH (ref 0.61–1.24)
GFR, Estimated: 35 mL/min — ABNORMAL LOW (ref >60)
Glucose, Bld: 106 mg/dL — ABNORMAL HIGH (ref 70–99)
Potassium: 4.8 mmol/L (ref 3.5–5.1)
Sodium: 136 mmol/L (ref 135–145)
Total Bilirubin: 0.6 mg/dL (ref 0.3–1.2)
Total Protein: 5.5 g/dL — ABNORMAL LOW (ref 6.5–8.1)

## 2022-04-08 LAB — MAGNESIUM: Magnesium: 1.9 mg/dL (ref 1.7–2.4)

## 2022-04-08 LAB — BRAIN NATRIURETIC PEPTIDE: B Natriuretic Peptide: 944.5 pg/mL — ABNORMAL HIGH (ref 0.0–100.0)

## 2022-04-08 LAB — GLUCOSE, CAPILLARY
Glucose-Capillary: 106 mg/dL — ABNORMAL HIGH (ref 70–99)
Glucose-Capillary: 189 mg/dL — ABNORMAL HIGH (ref 70–99)
Glucose-Capillary: 226 mg/dL — ABNORMAL HIGH (ref 70–99)
Glucose-Capillary: 126 mg/dL — ABNORMAL HIGH (ref 70–99)

## 2022-04-08 LAB — PROCALCITONIN: Procalcitonin: 0.21 ng/mL

## 2022-04-08 MED ORDER — ENOXAPARIN SODIUM 30 MG/0.3ML IJ SOSY
30.0000 mg | PREFILLED_SYRINGE | INTRAMUSCULAR | Status: DC
  Administered 2022-04-08 – 2022-04-09 (×2): 30 mg via SUBCUTANEOUS
  Filled 2022-04-08 (×2): qty 0.3

## 2022-04-08 MED ORDER — NAPHAZOLINE-GLYCERIN 0.012-0.25 % OP SOLN
1.0000 [drp] | Freq: Four times a day (QID) | OPHTHALMIC | Status: DC | PRN
  Administered 2022-04-08: 2 [drp] via OPHTHALMIC
  Filled 2022-04-08: qty 15

## 2022-04-08 MED ORDER — MIDODRINE HCL 5 MG PO TABS
10.0000 mg | ORAL_TABLET | Freq: Three times a day (TID) | ORAL | Status: DC
  Administered 2022-04-08 (×2): 10 mg via ORAL
  Filled 2022-04-08 (×3): qty 2

## 2022-04-08 MED ORDER — FUROSEMIDE 10 MG/ML IJ SOLN
20.0000 mg | Freq: Two times a day (BID) | INTRAMUSCULAR | Status: DC
  Administered 2022-04-08 – 2022-04-09 (×3): 20 mg via INTRAVENOUS
  Filled 2022-04-08: qty 4
  Filled 2022-04-08: qty 2
  Filled 2022-04-08: qty 4

## 2022-04-08 MED ORDER — SODIUM CHLORIDE 0.9 % IV SOLN: INTRAVENOUS | Status: DC | PRN

## 2022-04-08 MED ORDER — ALBUMIN HUMAN 25 % IV SOLN
12.5000 g | Freq: Once | INTRAVENOUS | Status: AC
  Administered 2022-04-08: 12.5 g via INTRAVENOUS
  Filled 2022-04-08: qty 50

## 2022-04-08 NOTE — Progress Notes (Signed)
Subjective:  CC: Micheal [name] is a 88 y.o. male  Hospital stay day 6, 3 Days Post-Op robotic lap sigmoidectomy for unresectable tubulovillous adenoma  HPI: No acute complaints overnight.  Recorded bowel movements yesterday as well.  Patient has minimal complaints of pain today.  Blood pressure more normal advised past few measurements.  He did have issues of urinary retention status post In-N-Out cath x1.  Patient and nurse reports no additional voids afterwards.  ROS:  General: Denies weight loss, weight gain, fatigue, fevers, chills, and night sweats. Heart: Denies chest pain, palpitations, racing heart, irregular heartbeat, leg pain or swelling, and decreased activity tolerance. Respiratory: Denies breathing difficulty, shortness of breath, wheezing, cough, and sputum. GI: Denies change in appetite, heartburn, nausea, vomiting, constipation, diarrhea, and blood in stool. GU: Denies difficulty urinating, pain with urinating, urgency, frequency, blood in urine.   Objective:   Temp:  [97.5 F (36.4 C)-98.5 F (36.9 C)] 98.5 F (36.9 C) (11/19 0810) Pulse Rate:  [71-94] 94 (11/19 0810) Resp:  [16-20] 16 (11/19 0810) BP: (97-119)/(33-55) 109/54 (11/19 0810) SpO2:  [89 %-98 %] 89 % (11/19 0810)     Height: '5\' 7"'$  (170.2 cm) Weight: 60.1 kg BMI (Calculated): 20.73   Intake/Output this shift:   Intake/Output Summary (Last 24 hours) at 04/08/2022 1108 Last data filed at 04/08/2022 0537 Gross per 24 hour  Intake 3192.48 ml  Output 1197 ml  Net 1995.48 ml    Constitutional :  alert, cooperative, appears stated age, and no distress  Respiratory:  clear to auscultation bilaterally  Cardiovascular:  regular rate and rhythm  Gastrointestinal: Soft, no guarding, increased distention, mild tenderness to palpation right along infraumbilical extraction site as expected .   Skin: Cool and moist. Incision dressing c/d/I.  Peripheral edema noted especially in hands bilaterally.   Psychiatric: Normal affect, non-agitated, not confused       LABS:     Latest Ref Rng & Units 04/08/2022    4:08 AM 04/07/2022    4:14 AM 04/06/2022    7:39 PM  CMP  Glucose 70 - 99 mg/dL 106  151  203   BUN 8 - 23 mg/dL 32  34  31   Creatinine 0.61 - 1.24 mg/dL 1.84  1.77  1.70   Sodium 135 - 145 mmol/L 136  140  139   Potassium 3.5 - 5.1 mmol/L 4.8  4.8  4.8   Chloride 98 - 111 mmol/L 110  110  108   CO2 22 - 32 mmol/L '23  25  24   '$ Calcium 8.9 - 10.3 mg/dL 7.3  7.2  7.4   Total Protein 6.5 - 8.1 g/dL 5.5     Total Bilirubin 0.3 - 1.2 mg/dL 0.6     Alkaline Phos 38 - 126 U/L 54     AST 15 - 41 U/L 13     ALT 0 - 44 U/L 10         Latest Ref Rng & Units 04/08/2022    4:08 AM 04/07/2022    4:14 AM 04/06/2022    7:39 PM  CBC  WBC 4.0 - 10.5 K/uL 14.5  16.7  20.4   Hemoglobin 13.0 - 17.0 g/dL 10.9  9.8  10.3   Hematocrit 39.0 - 52.0 % 36.6  32.6  34.2   Platelets 150 - 400 K/uL 84  96  99     RADS: CLINICAL DATA:  Abdominal distension   EXAM: ABDOMEN - 1 VIEW   COMPARISON:  03/01/2022   FINDINGS: Generalized gaseous distension of large and small bowel. No gross free intraperitoneal air. Multiple surgical staples overlie the abdomen and pelvis. No radio-opaque calculi or other significant radiographic abnormality are seen.   IMPRESSION: Generalized gaseous distension of large and small bowel, which may reflect ileus.     Electronically Signed   By: Davina Poke D.O.   On: 04/08/2022 11:08 Assessment:   S/p robotic lap sigmoidectomy for unresectable tubulovillous adenoma.  Tolerating clear liquid.  With recorded bowel movement but noted to have slightly increased distention on abdominal exam today.  KUB as noted above.  We will continue 100 and clear liquid diets for possible developing ileus.  Blood pressure has now improved but patient is starting to show visible signs of peripheral edema.  Hospitalist concern for infection as cause of his previous  hypotensive episodes so started on antibiotics.   Pending bladder scan to see if he has persistent urinary retention.  Recommend Foley catheter if he has a low episode of retention.  Generalized edema noted with possible fluid overload.  Further care per hospitalist.  Hemoglobin has stabilized. okay to resume DVT prophylaxis with Lovenox.  PT eval and treat ordered.   labs/images/medications/previous chart entries reviewed personally and relevant changes/updates noted above.

## 2022-04-08 NOTE — Progress Notes (Signed)
PROGRESS NOTE    Micheal [name]  FXT:[id number] DOB: 04/13/1933 DOA: 04/02/2022 PCP: Howard Pouch, NP    Brief Narrative:  88 year old with history of COPD, CAD, CKD, DM 2, HTN, tubulovillous adenoma of colon tobacco use admitted on 11/13 for laparoscopic colon resection, which was done 11/16.  Medical team consulted for medical management especially with blood glucose.  Hospitalist complicated by hypotension on the evening of 11/17.  Treated with IV fluids with noted lactic acidosis, white blood cell count.  Hypotension persisting into 11/18 and mildly elevated procalcitonin and patient started on IV Zosyn 11/18.  By 11/19, hypotension better after patient on fluids plus midodrine.  White blood cell count trending downward.  Noted to have mild oxygen desaturation and BNP checked and found to be elevated at 945.  No previous history of heart failure, so fluids discontinued, started on gentle IV Lasix and echocardiogram ordered.    Assessment & Plan:   Active Problems:   DM (diabetes mellitus), type 2, uncontrolled, with hyperosmolarity ([place])   Acute renal failure superimposed on stage 3b chronic kidney disease (HCC)   Thrombocytopenia (HCC)   CAD (coronary artery disease)   COPD (chronic obstructive pulmonary disease) (HCC)   Erosive gastritis   HTN (hypertension)   Alcohol abuse  Infection causing hypotension Likely infectious based causing hypotension.  Sepsis has been ruled out since does not meet criteria with basic SIRS criteria.  Responding to Zosyn.  Have stopped IV fluids secondary to heart failure.  See below.  Continue midodrine.  Acute heart failure unclassified: No previous history of heart failure.  BNP elevated at 945, following aggressive fluid resuscitation.  Have stopped IV fluids and started IV Lasix.  Echocardiogram ordered.  DM (diabetes mellitus), type 2, uncontrolled, with hyperosmolarity ([place]) Poorly controlled last A1c is 10.8.   Patient's home medication  Lantus 16 units daily, glipizide 2.5 mg twice daily, Trijardy. Sugars labile.  Some episodes of hypoglycemia.  With adjustments in long-acting insulin, CBGs are better.   Acute renal failure superimposed on stage 3b chronic kidney disease ([place]) Worsening creatinine likely due to hypotension and infection as above.  Creatinine continue to worsen, possibly from decompensated heart failure.  As above.  COPD (chronic obstructive pulmonary disease) (HCC) Mild abnormal breath sounds Breath sounds improved.   Air movement coarse but no obvious wheezing.   Patient on room air.  Continue prn bronchodilator regimen.     Thrombocytopenia ([place]) Appears to be chronic.   No active signs of bleeding.  Continue to monitor.   Okay for chemo DVT prophylaxis if necessary postoperatively depending on surgical course   Colon mass, tubulovillous adenoma - Suspicion for malignancy.   Admitted under general surgery during this admission for laparoscopic resection.   Management per surgery Dw Dr. Lysle Pearl   CAD (coronary artery disease) No chest pain.  Normally on Toprol, held secondary to hypotension.     Erosive gastritis PPI twice daily   HTN (hypertension) Initially on Toprol, held secondary to hypotension   Remote Hx of EtOH abuse, currently no signs of withdrawal.   Thank you for this consult.  [facility] hospitalist service to continue following patient with you.  Recommendations discussed with primary attending Dr. Lysle Pearl.  DVT prophylaxis: Per general surgery Code Status: Full Family Communication: None Disposition Plan: Per general surgery Level of care: Med-Surg  Consultants:  Hospitalist  Procedures:  Status post laparoscopic colonic resection Echocardiogram pending  Antimicrobials: Flagyl Erythromycin Now on Zosyn 11/18-present   Subjective: Complains of some shortness  of breath  Objective: Vitals:   04/07/22 1951 04/08/22 0513 04/08/22 0810 04/08/22 1152  BP: (!) 97/33 (!)  119/55 (!) 109/54 (!) 108/56  Pulse: 77 89 94 87  Resp: '20 18 16 19  '$ Temp: (!) 97.5 F (36.4 C) (!) 97.5 F (36.4 C) 98.5 F (36.9 C) 97.6 F (36.4 C)  TempSrc: Oral Oral  Oral  SpO2: 93% 90% (!) 89% 96%  Weight:      Height:        Intake/Output Summary (Last 24 hours) at 04/08/2022 1325 Last data filed at 04/08/2022 1142 Gross per 24 hour  Intake 3192.48 ml  Output 1677 ml  Net 1515.48 ml    Filed Weights   04/02/22 2038 04/05/22 1219  Weight: 60.1 kg 60.1 kg    Examination:  General exam: Looks uncomfortable HEENT: Normocephalic, atraumatic.  Sclera bilaterally injected Respiratory system: Decreased breath sounds bibasilar Cardiovascular system: Regular rate and rhythm, S1-S2 Gastrointestinal system: Status post colonic resection Central nervous system: Alert and oriented. No focal neurological deficits. Extremities: No clubbing or cyanosis, trace pitting edema Skin: No rashes, lesions or ulcers Psychiatry: Appropriate, no evidence of psychoses    Data Reviewed: I have personally reviewed following labs and imaging studies Improving procalcitonin and white blood cell count.  BNP of 945, increasing creatinine CBC: Recent Labs  Lab 04/06/22 0415 04/06/22 1444 04/06/22 1939 04/07/22 0414 04/08/22 0408  WBC 19.9* 20.5* 20.4* 16.7* 14.5*  NEUTROABS  --  17.1* 16.7*  --   --   HGB 11.2* 10.4* 10.3* 9.8* 10.9*  HCT 36.4* 33.7* 34.2* 32.6* 36.6*  MCV 99.7 100.6* 101.2* 102.2* 102.8*  PLT 96* 115* 99* 96* 84*    Basic Metabolic Panel: Recent Labs  Lab 04/02/22 2022 04/03/22 0929 04/04/22 0433 04/05/22 0538 04/06/22 0415 04/06/22 1444 04/06/22 1939 04/07/22 0414 04/08/22 0408  NA 139   < > 136 140 144 143 139 140 136  K 4.6   < > 4.2 4.4 4.9 6.0* 4.8 4.8 4.8  CL 100   < > 101 105 110 110 108 110 110  CO2 32   < > '30 26 29 28 24 25 23  '$ GLUCOSE 154*   < > 200* 115* 133* 95 203* 151* 106*  BUN 40*   < > 40* 30* 27* 33* 31* 34* 32*  CREATININE 1.29*    < > 1.36* 1.17 1.42* 1.69* 1.70* 1.77* 1.84*  CALCIUM 8.5*   < > 8.2* 8.5* 7.9* 7.7* 7.4* 7.2* 7.3*  MG 1.7  --  1.8 2.3 2.2  --   --  2.0 1.9  PHOS 3.0  --   --   --   --   --   --   --   --    < > = values in this interval not displayed.    GFR: Estimated Creatinine Clearance: 24 mL/min (A) (by C-G formula based on SCr of 1.84 mg/dL (H)). Liver Function Tests: Recent Labs  Lab 04/02/22 2022 04/08/22 0408  AST 17 13*  ALT 15 10  ALKPHOS 61 54  BILITOT 0.6 0.6  PROT 6.6 5.5*  ALBUMIN 3.2* 2.5*    No results for input(s): "LIPASE", "AMYLASE" in the last 168 hours. No results for input(s): "AMMONIA" in the last 168 hours. Coagulation Profile: No results for input(s): "INR", "PROTIME" in the last 168 hours. Cardiac Enzymes: No results for input(s): "CKTOTAL", "CKMB", "CKMBINDEX", "TROPONINI" in the last 168 hours. BNP (last 3 results) No results for input(s): "PROBNP"  in the last 8760 hours. HbA1C: No results for input(s): "HGBA1C" in the last 72 hours. CBG: Recent Labs  Lab 04/07/22 1124 04/07/22 1608 04/07/22 2107 04/08/22 0818 04/08/22 1154  GLUCAP 151* 190* 124* 106* 189*    Lipid Profile: No results for input(s): "CHOL", "HDL", "LDLCALC", "TRIG", "CHOLHDL", "LDLDIRECT" in the last 72 hours. Thyroid Function Tests: No results for input(s): "TSH", "T4TOTAL", "FREET4", "T3FREE", "THYROIDAB" in the last 72 hours. Anemia Panel: No results for input(s): "VITAMINB12", "FOLATE", "FERRITIN", "TIBC", "IRON", "RETICCTPCT" in the last 72 hours. Sepsis Labs: Recent Labs  Lab 04/06/22 1444 04/06/22 1719 04/06/22 1939 04/07/22 0414 04/07/22 0851 04/08/22 0408  PROCALCITON  --   --   --  0.27  --  0.21  LATICACIDVEN 2.8* 2.5* 2.0*  --  1.4  --      Recent Results (from the past 240 hour(s))  Surgical PCR screen     Status: None   Collection Time: 04/04/22 10:42 PM   Specimen: Nasal Mucosa; Nasal Swab  Result Value Ref Range Status   MRSA, PCR NEGATIVE NEGATIVE  Final   Staphylococcus aureus NEGATIVE NEGATIVE Final    Comment: (NOTE) The Xpert SA Assay (FDA approved for NASAL specimens in patients 69 years of age and older), is one component of a comprehensive surveillance program. It is not intended to diagnose infection nor to guide or monitor treatment. Performed at [facility], [address] [place], [place] [address]   Culture, blood (Routine X 2) w Reflex to ID Panel     Status: None (Preliminary result)   Collection Time: 04/07/22  6:30 PM   Specimen: BLOOD  Result Value Ref Range Status   Specimen Description BLOOD BLOOD LEFT HAND  Final   Special Requests   Final    BOTTLES DRAWN AEROBIC AND ANAEROBIC Blood Culture adequate volume   Culture   Final    NO GROWTH < 12 HOURS Performed at [facility], [address], [place], [place] [address]    Report Status PENDING  Incomplete  Culture, blood (Routine X 2) w Reflex to ID Panel     Status: None (Preliminary result)   Collection Time: 04/07/22  6:36 PM   Specimen: BLOOD  Result Value Ref Range Status   Specimen Description BLOOD BLOOD RIGHT HAND  Final   Special Requests   Final    BOTTLES DRAWN AEROBIC AND ANAEROBIC Blood Culture adequate volume   Culture   Final    NO GROWTH < 12 HOURS Performed at [facility], [address] [place], [place] [address]    Report Status PENDING  Incomplete         Radiology Studies: DG Abd 1 View  Result Date: 04/08/2022 CLINICAL DATA:  Abdominal distension EXAM: ABDOMEN - 1 VIEW COMPARISON:  03/01/2022 FINDINGS: Generalized gaseous distension of large and small bowel. No gross free intraperitoneal air. Multiple surgical staples overlie the abdomen and pelvis. No radio-opaque calculi or other significant radiographic abnormality are seen. IMPRESSION: Generalized gaseous distension of large and small bowel, which may reflect ileus. Electronically Signed   By: Davina Poke D.O.   On: 04/08/2022 11:08         Scheduled Meds:  aspirin EC  81 mg Oral Daily   atorvastatin  40 mg Oral Daily   enoxaparin (LOVENOX) injection  30 mg Subcutaneous Q24H   furosemide  20 mg Intravenous BID   gabapentin  300 mg Oral BID   insulin aspart  0-9 Units Subcutaneous  TID WC   insulin glargine-yfgn  8 Units Subcutaneous Daily   lipase/protease/amylase  36,000 Units Oral TID AC   midodrine  10 mg Oral TID WC   multivitamin with minerals  1 tablet Oral Daily   nicotine  21 mg Transdermal Daily   pantoprazole  40 mg Oral BID AC   triamcinolone ointment   Topical BID   Continuous Infusions:  albumin human     piperacillin-tazobactam (ZOSYN)  IV 3.375 g (04/08/22 0509)     LOS: 6 days    Annita Brod, MD [facility]   If 7PM-7AM, please contact night-coverage  04/08/2022, 1:25 PM

## 2022-04-08 NOTE — Progress Notes (Signed)
PHARMACIST - PHYSICIAN COMMUNICATION  CONCERNING:  Enoxaparin (Lovenox) for DVT Prophylaxis    RECOMMENDATION: Patient was prescribed enoxaprin '40mg'$  q24 hours for VTE prophylaxis.   Filed Weights   04/02/22 2038 04/05/22 1219  Weight: 60.1 kg (132 lb 6.4 oz) 60.1 kg (132 lb 6.4 oz)    Body mass index is 20.74 kg/m.  Estimated Creatinine Clearance: 24 mL/min (A) (by C-G formula based on SCr of 1.84 mg/dL (H)).   Patient is candidate for enoxaparin '30mg'$  every 24 hours based on CrCl <53m/min or Weight <45kg  DESCRIPTION: [facility] [place] [place]  Patient is now receiving enoxaparin 30 mg every 24 hours    CWynelle Cleveland PharmD Clinical Pharmacist  04/08/2022 11:15 AM

## 2022-04-08 NOTE — Evaluation (Addendum)
Physical Therapy Evaluation Patient Details Name: Micheal [name] MRN: [id number] DOB: July 16, 1932 Today's Date: 04/08/2022  History of Present Illness  Pt is an 88 y/o M admitted on 04/02/22 for laparoscopic colon resection, which was done 11/16. Medical team was consulted for management, especially blood glucose & pt with hypotension on the evening of 11/17. Hypotension is persisting & thought to be caused by infection. PMH: COPD, CAD, CKD, DM2, HTN, tubulovillous adenoma of colon  Clinical Impression  Pt seen for PT evaluation with pt received in bed with step daughter present in room voicing concerns about pt being unable to hold cell phone with either BUE. PT did not note any neurological symptoms & nurse called to room & cleared pt for participation. Prior to admission pt was staying with his step daughter, ambulating with SPC. On this date pt is AxOx4 & demonstrates global weakness with tremors noted in all extremities. Pt requires mod assist with HOB fully elevated & use of bed rails to complete bed mobility, MAX assist for STS & step pivot bed>recliner. Pt with significant trembling & buckling in BLE & it was unsafe to attempt ambulation without +2 assist. Educated pt on need to sit in recliner for meals. Pt engaged in BLE strengthening exercises with cuing for technique. Pt is unsafe to d/c home at this time & would benefit from STR upon d/c to maximize independence with functional mobility & reduce fall risk prior to return home. Will continue to follow pt acutely to address strengthening, balance, and gait with LRAD.   Addendum: MD notified of pt's step daughter's concerns.   Recommendations for follow up therapy are one component of a multi-disciplinary discharge planning process, led by the attending physician.  Recommendations may be updated based on patient status, additional functional criteria and insurance authorization.  Follow Up Recommendations Skilled nursing-short term rehab (<3  hours/day) Can patient physically be transported by private vehicle: No    Assistance Recommended at Discharge Frequent or constant Supervision/Assistance  Patient can return home with the following  Two people to help with walking and/or transfers;Two people to help with bathing/dressing/bathroom;Help with stairs or ramp for entrance;Assistance with feeding;Assist for transportation;Assistance with cooking/housework;Direct supervision/assist for financial management    Equipment Recommendations  (TBD in next venue)  Recommendations for Other Services  OT consult    Functional Status Assessment Patient has had a recent decline in their functional status and demonstrates the ability to make significant improvements in function in a reasonable and predictable amount of time.     Precautions / Restrictions Precautions Precautions: Fall Restrictions Weight Bearing Restrictions: No      Mobility  Bed Mobility Overal bed mobility: Needs Assistance Bed Mobility: Supine to Sit     Supine to sit: Mod assist, HOB elevated     General bed mobility comments: HOB significantly elevated, use of bed rails, extra time    Transfers Overall transfer level: Needs assistance Equipment used: Rolling walker (2 wheels) Transfers: Sit to/from Stand, Bed to chair/wheelchair/BSC Sit to Stand: Max assist, From elevated surface   Step pivot transfers: Max assist       General transfer comment: Educational cuing re: safe hand placement during STS, assistance to power up to standing & for eccentric lowering with stand>sit.    Ambulation/Gait                  Stairs            Wheelchair Mobility    Modified Rankin (Stroke Patients  Only)       Balance Overall balance assessment: Needs assistance Sitting-balance support: Feet unsupported, Bilateral upper extremity supported Sitting balance-Leahy Scale: Fair Sitting balance - Comments: very close supervision<>CGA for static  sitting EOB   Standing balance support: During functional activity, Reliant on assistive device for balance, Bilateral upper extremity supported Standing balance-Leahy Scale: Poor                               Pertinent Vitals/Pain Pain Assessment Pain Assessment: No/denies pain    Home Living Family/patient expects to be discharged to:: Private residence Living Arrangements:  (step daughter) Available Help at Discharge: Family;Available PRN/intermittently Type of Home: House Home Access: Stairs to enter Entrance Stairs-Rails: Right;Left Entrance Stairs-Number of Steps: 3-4   Home Layout: One level Home Equipment: Conservation officer, nature (2 wheels);Shower seat;Rollator (4 wheels);Cane - single point Additional Comments: Home set up information provided above is reflective of pt's step-daughter's house. Pt has been staying with her 2/2 marital issues but his home is 1 level with ramped entrance.    Prior Function Prior Level of Function : Independent/Modified Independent;Driving             Mobility Comments: Pt was mod I with SPC, driving.       Hand Dominance        Extremity/Trunk Assessment   Upper Extremity Assessment Upper Extremity Assessment: Generalized weakness (BUE edema, R>L)    Lower Extremity Assessment Lower Extremity Assessment: Generalized weakness       Communication   Communication: No difficulties  Cognition Arousal/Alertness: Awake/alert Behavior During Therapy: WFL for tasks assessed/performed Overall Cognitive Status: Within Functional Limits for tasks assessed                                 General Comments: AxOx4, follows simple commands fairly well during session        General Comments General comments (skin integrity, edema, etc.): Pt on 1L/min via nasal cannula throughout session. LUE IV appears to have come out -- nurse notified & asked to assess. Pt's step daughter requesting incentive spirometer & asked MD  for order.    Exercises General Exercises - Lower Extremity Long Arc Quad: AROM, Strengthening, Both, 10 reps, Seated Hip ABduction/ADduction: AROM, Strengthening, Both, Seated, 10 reps (hip adduction pillow squeezes) Hip Flexion/Marching: AROM, Seated, Strengthening, Both, 10 reps   Assessment/Plan    PT Assessment Patient needs continued PT services  PT Problem List Decreased strength;Cardiopulmonary status limiting activity;Decreased coordination;Decreased activity tolerance;Decreased balance;Decreased knowledge of use of DME;Decreased mobility;Decreased range of motion;Decreased safety awareness;Decreased skin integrity       PT Treatment Interventions DME instruction;Therapeutic exercise;Gait training;Balance training;Stair training;Neuromuscular re-education;Functional mobility training;Therapeutic activities;Patient/family education;Modalities;Manual techniques    PT Goals (Current goals can be found in the Care Plan section)  Acute Rehab PT Goals Patient Stated Goal: get better PT Goal Formulation: With patient/family Time For Goal Achievement: 04/22/22 Potential to Achieve Goals: Fair    Frequency Min 2X/week     Co-evaluation               AM-PAC PT "6 Clicks" Mobility  Outcome Measure Help needed turning from your back to your side while in a flat bed without using bedrails?: A Little Help needed moving from lying on your back to sitting on the side of a flat bed without using bedrails?: A Lot Help needed moving  to and from a bed to a chair (including a wheelchair)?: Total Help needed standing up from a chair using your arms (e.g., wheelchair or bedside chair)?: Total Help needed to walk in hospital room?: Total Help needed climbing 3-5 steps with a railing? : Total 6 Click Score: 9    End of Session Equipment Utilized During Treatment: Oxygen Activity Tolerance: Patient tolerated treatment well Patient left: in chair;with chair alarm set;with call  bell/phone within reach;with family/visitor present Nurse Communication: Mobility status PT Visit Diagnosis: Unsteadiness on feet (R26.81);Muscle weakness (generalized) (M62.81);Difficulty in walking, not elsewhere classified (R26.2);Other abnormalities of gait and mobility (R26.89)    Time: 9811-9147 PT Time Calculation (min) (ACUTE ONLY): 25 min   Charges:   PT Evaluation $PT Eval Moderate Complexity: 1 Mod PT Treatments $Therapeutic Activity: 8-22 mins        Lavone Nian, PT, DPT 04/08/22, 2:21 PM   Waunita Schooner 04/08/2022, 2:18 PM

## 2022-04-09 ENCOUNTER — Inpatient Hospital Stay (HOSPITAL_COMMUNITY)
Admission: RE | Admit: 2022-04-09 | Discharge: 2022-04-09 | Disposition: A | Discharge status: Home / Self Care | Payer: Medicare PPO | Source: Ambulatory Visit | Attending: Internal Medicine | Admitting: Internal Medicine

## 2022-04-09 ENCOUNTER — Inpatient Hospital Stay: Payer: Medicare PPO

## 2022-04-09 DIAGNOSIS — I5031 Acute diastolic (congestive) heart failure: Secondary | ICD-10-CM

## 2022-04-09 DIAGNOSIS — A419 Sepsis, unspecified organism: Secondary | ICD-10-CM

## 2022-04-09 DIAGNOSIS — Z515 Encounter for palliative care: Secondary | ICD-10-CM | POA: Diagnosis not present

## 2022-04-09 DIAGNOSIS — E1100 Type 2 diabetes mellitus with hyperosmolarity without nonketotic hyperglycemic-hyperosmolar coma (NKHHC): Secondary | ICD-10-CM | POA: Diagnosis not present

## 2022-04-09 DIAGNOSIS — R6520 Severe sepsis without septic shock: Secondary | ICD-10-CM

## 2022-04-09 DIAGNOSIS — I10 Essential (primary) hypertension: Secondary | ICD-10-CM | POA: Diagnosis not present

## 2022-04-09 DIAGNOSIS — N179 Acute kidney failure, unspecified: Secondary | ICD-10-CM | POA: Diagnosis not present

## 2022-04-09 LAB — CBC
HCT: 32.6 % — ABNORMAL LOW (ref 39.0–52.0)
Hemoglobin: 9.8 g/dL — ABNORMAL LOW (ref 13.0–17.0)
MCH: 30 pg (ref 26.0–34.0)
MCHC: 30.1 g/dL (ref 30.0–36.0)
MCV: 99.7 fL (ref 80.0–100.0)
Platelets: 86 10*3/uL — ABNORMAL LOW (ref 150–400)
RBC: 3.27 MIL/uL — ABNORMAL LOW (ref 4.22–5.81)
RDW: 16.4 % — ABNORMAL HIGH (ref 11.5–15.5)
WBC: 15.7 10*3/uL — ABNORMAL HIGH (ref 4.0–10.5)
nRBC: 0 % (ref 0.0–0.2)

## 2022-04-09 LAB — ECHOCARDIOGRAM COMPLETE
AR max vel: 2.08 cm2
AV Area VTI: 2.58 cm2
AV Area mean vel: 2.23 cm2
AV Mean grad: 2 mmHg
AV Peak grad: 4.4 mmHg
Ao pk vel: 1.05 m/s
Area-P 1/2: 3.46 cm2
Height: 67 in
S' Lateral: 2.4 cm
Weight: 2118.39 oz

## 2022-04-09 LAB — BASIC METABOLIC PANEL
Anion gap: 4 — ABNORMAL LOW (ref 5–15)
BUN: 36 mg/dL — ABNORMAL HIGH (ref 8–23)
CO2: 27 mmol/L (ref 22–32)
Calcium: 7.3 mg/dL — ABNORMAL LOW (ref 8.9–10.3)
Chloride: 110 mmol/L (ref 98–111)
Creatinine, Ser: 2.23 mg/dL — ABNORMAL HIGH (ref 0.61–1.24)
GFR, Estimated: 28 mL/min — ABNORMAL LOW (ref >60)
Glucose, Bld: 92 mg/dL (ref 70–99)
Potassium: 4.4 mmol/L (ref 3.5–5.1)
Sodium: 141 mmol/L (ref 135–145)

## 2022-04-09 LAB — MAGNESIUM: Magnesium: 1.8 mg/dL (ref 1.7–2.4)

## 2022-04-09 LAB — SURGICAL PATHOLOGY

## 2022-04-09 LAB — GLUCOSE, CAPILLARY
Glucose-Capillary: 87 mg/dL (ref 70–99)
Glucose-Capillary: 88 mg/dL (ref 70–99)
Glucose-Capillary: 91 mg/dL (ref 70–99)
Glucose-Capillary: 82 mg/dL (ref 70–99)
Glucose-Capillary: 91 mg/dL (ref 70–99)

## 2022-04-09 MED ORDER — ARTIFICIAL TEARS OPHTHALMIC OINT
1.0000 | TOPICAL_OINTMENT | OPHTHALMIC | Status: DC | PRN
  Administered 2022-04-10: 1 via OPHTHALMIC
  Filled 2022-04-09 (×2): qty 1

## 2022-04-09 MED ORDER — MORPHINE SULFATE (PF) 2 MG/ML IV SOLN
2.0000 mg | INTRAVENOUS | Status: DC | PRN
  Administered 2022-04-10 (×2): 2 mg via INTRAVENOUS
  Filled 2022-04-09 (×2): qty 1

## 2022-04-09 NOTE — Progress Notes (Signed)
Chaplain visited to follow up on consult entered. Pt family had already left and pt was with care team. Followed up with nurse, nurse shared their is a complex family dynamic, and only one Son has power over healthcare. Family is looking to make some important decisions in the next day or so and would appreciate chaplain support. Nurse shared she will page chaplain phone when pt family returns. Pager number # [phone]

## 2022-04-09 NOTE — Evaluation (Signed)
Occupational Therapy Evaluation Patient Details Name: Micheal [name] MRN: [id number] DOB: 02-11-1933 Today's Date: 04/09/2022   History of Present Illness Pt is an 88 y/o M admitted on 04/02/22 for laparoscopic colon resection, which was done 11/16. Medical team was consulted for management, especially blood glucose & pt with hypotension on the evening of 11/17. Hypotension is persisting & thought to be caused by infection. PMH: COPD, CAD, CKD, DM2, HTN, tubulovillous adenoma of colon   Clinical Impression   Patient presenting with deficits in self care, ROM, strength, balance, functional mobility/transfers, and endurance. Prior to admission, pt was Mod I for ADLs, Mod I for functional mobility using a SPC, and was driving. During evaluation, pt required Max A for supine<>sit, Min-Mod A for static sitting balance, Max A for LB dressing, and Mod A for grooming tasks. Pt with significant jerking in BUEs impacting ability to complete self-feeding and grooming tasks. Pt also demonstrated difficulty with visual tracking. Pt will benefit from acute OT to increase overall independence in the areas of ADLs and functional mobility in order to safely discharge to next venue of care. Upon hospital discharge, recommend STR to maximize pt safety and return to PLOF.       Recommendations for follow up therapy are one component of a multi-disciplinary discharge planning process, led by the attending physician.  Recommendations may be updated based on patient status, additional functional criteria and insurance authorization.   Follow Up Recommendations  Skilled nursing-short term rehab (<3 hours/day)     Assistance Recommended at Discharge Frequent or constant Supervision/Assistance  Patient can return home with the following Two people to help with walking and/or transfers;Two people to help with bathing/dressing/bathroom;Assist for transportation;Assistance with cooking/housework;Help with stairs or ramp for  entrance;Assistance with feeding;Direct supervision/assist for medications management;Direct supervision/assist for financial management    Functional Status Assessment  Patient has had a recent decline in their functional status and demonstrates the ability to make significant improvements in function in a reasonable and predictable amount of time.  Equipment Recommendations  Other (comment) (defer to next venue of care)    Recommendations for Other Services       Precautions / Restrictions Precautions Precautions: Fall Restrictions Weight Bearing Restrictions: No      Mobility Bed Mobility Overal bed mobility: Needs Assistance Bed Mobility: Supine to Sit, Sit to Supine, Rolling Rolling: Max assist   Supine to sit: Max assist Sit to supine: Max assist   General bed mobility comments: +2 to scoot up towards [facility]    Transfers                          Balance Overall balance assessment: Needs assistance Sitting-balance support: Feet unsupported, Bilateral upper extremity supported Sitting balance-Leahy Scale: Fair Sitting balance - Comments: Min-Mod A for static sitting balance, VC to support self using BUE, UE jerking noted causing pt to lean forward at times                                   ADL either performed or assessed with clinical judgement   ADL Overall ADL's : Needs assistance/impaired   Eating/Feeding Details (indicate cue type and reason): difficulty holding onto water cup even with therapist positioning in hands 2/2 jerking Grooming: Wash/dry face;Bed level;Moderate assistance Grooming Details (indicate cue type and reason): difficulty holding onto washcloth and reaching all areas of face  Lower Body Dressing: Maximal assistance;Bed level                       Vision Patient Visual Report: Blurring of vision Vision Assessment?: Yes Eye Alignment: Within Functional Limits Ocular Range of Motion: Within  Functional Limits Alignment/Gaze Preference: Within Defined Limits Tracking/Visual Pursuits: Requires cues, head turns, or add eye shifts to track;Left eye does not track laterally;Decreased smoothness of vertical tracking;Decreased smoothness of horizontal tracking Additional Comments: Pt denied diplopia but reported some blurry vision. Unable to track to L side, required Max cues to track down and laterally with R eye. RN checking with MD about eye drops (very red and has drainage)     Perception     Praxis      Pertinent Vitals/Pain Pain Assessment Pain Assessment: Faces Faces Pain Scale: Hurts a little bit Pain Location: abdomen (incision site), BUE Pain Descriptors / Indicators: Grimacing, Guarding, Discomfort Pain Intervention(s): Limited activity within patient's tolerance, Monitored during session, Repositioned     Hand Dominance Right   Extremity/Trunk Assessment Upper Extremity Assessment Upper Extremity Assessment: Generalized weakness;RUE deficits/detail;LUE deficits/detail (Significant jerking in BUEs) RUE Deficits / Details: grip strength fair, shoulder flexion ~90 deg, full elbow ROM, full composite fist LUE Deficits / Details: grip strength poor, elbow flexion 1/2 full ROM, shoulder flexion 1/4 full ROM, full composite fist   Lower Extremity Assessment Lower Extremity Assessment: Generalized weakness       Communication Communication Communication: No difficulties   Cognition Arousal/Alertness: Awake/alert Behavior During Therapy: Flat affect Overall Cognitive Status: No family/caregiver present to determine baseline cognitive functioning                                 General Comments: A&Ox4, delayed processing, perseverating at end of session about pillow being under R hip to off load buttocks to prevent pressure injury despite education     General Comments       Exercises Other Exercises Other Exercises: OT provided education re: role  of OT, OT POC, post acute recs, sitting up for all meals, EOB/OOB mobility with assistance, home/fall safety.     Shoulder Instructions      Home Living Family/patient expects to be discharged to:: Private residence Living Arrangements:  (step daughter) Available Help at Discharge: Family;Available PRN/intermittently Type of Home: House Home Access: Stairs to enter [employer] of Steps: 3-4 Entrance Stairs-Rails: Right;Left Home Layout: One level               Home Equipment: Conservation officer, nature (2 wheels);Shower seat;Rollator (4 wheels);Cane - single point   Additional Comments: Home set up information provided above is reflective of pt's step-daughter's house. Pt has been staying with her 2/2 marital issues but his home is 1 level with ramped entrance.      Prior Functioning/Environment Prior Level of Function : Independent/Modified Independent;Driving             Mobility Comments: Pt was mod I with SPC, driving. ADLs Comments: drives, mod I in ADL, uses shower chair, does not cook- goes out to eat        OT Problem List: Decreased range of motion;Decreased strength;Decreased coordination;Decreased activity tolerance;Impaired balance (sitting and/or standing);Impaired vision/perception;Impaired UE functional use;Decreased cognition;Pain;Decreased knowledge of use of DME or AE;Cardiopulmonary status limiting activity      OT Treatment/Interventions: Self-care/ADL training;Therapeutic exercise;Therapeutic activities;Cognitive remediation/compensation;DME and/or AE instruction;Balance training;Patient/family education    OT Goals(Current  goals can be found in the care plan section) Acute Rehab OT Goals Patient Stated Goal: reduce pain, get stronger OT Goal Formulation: With patient Time For Goal Achievement: 04/23/22 Potential to Achieve Goals: Fair   OT Frequency: Min 2X/week    Co-evaluation              AM-PAC OT "6 Clicks" Daily Activity      Outcome Measure Help from another person eating meals?: A Lot Help from another person taking care of personal grooming?: A Lot Help from another person toileting, which includes using toliet, bedpan, or urinal?: A Lot Help from another person bathing (including washing, rinsing, drying)?: A Lot Help from another person to put on and taking off regular upper body clothing?: A Lot Help from another person to put on and taking off regular lower body clothing?: A Lot 6 Click Score: 12   End of Session Equipment Utilized During Treatment: Oxygen (3L O2 via [place]) Nurse Communication: Mobility status  Activity Tolerance: Patient limited by fatigue;Patient tolerated treatment well Patient left: in bed;with call bell/phone within reach;with bed alarm set;with nursing/sitter in room  OT Visit Diagnosis: Other abnormalities of gait and mobility (R26.89);Muscle weakness (generalized) (M62.81);Pain                Time: 7494-4967 OT Time Calculation (min): 36 min Charges:  OT General Charges $OT Visit: 1 Visit OT Evaluation $OT Eval Moderate Complexity: [place] MS, OTR/L ascom [phone]  04/09/22, 6:03 PM

## 2022-04-09 NOTE — Progress Notes (Signed)
Subjective:  CC: Micheal [name] is a 88 y.o. male  Hospital stay day 7, 4 Days Post-Op robotic lap sigmoidectomy for unresectable tubulovillous adenoma  HPI: Decreased mentation this AM.  Patient does not report any increase in pain but looks uncomfortable.  Main complaint is increasing weakness of bilateral upper extremities.  ROS:  General: Denies weight loss, weight gain, fatigue, fevers, chills, and night sweats. Heart: Denies chest pain, palpitations, racing heart, irregular heartbeat, leg pain or swelling, and decreased activity tolerance. Respiratory: Denies breathing difficulty, shortness of breath, wheezing, cough, and sputum. GI: Denies change in appetite, heartburn, nausea, vomiting, constipation, diarrhea, and blood in stool. GU: Denies difficulty urinating, pain with urinating, urgency, frequency, blood in urine.   Objective:   Temp:  [97.8 F (36.6 C)-99.2 F (37.3 C)] 97.8 F (36.6 C) (11/20 1428) Pulse Rate:  [75-110] 101 (11/20 1428) Resp:  [18-20] 20 (11/20 1428) BP: (92-171)/(40-94) 114/51 (11/20 1428) SpO2:  [79 %-99 %] 93 % (11/20 1428)     Height: '5\' 7"'$  (170.2 cm) Weight: 60.1 kg BMI (Calculated): 20.73   Intake/Output this shift:   Intake/Output Summary (Last 24 hours) at 04/09/2022 1457 Last data filed at 04/09/2022 0603 Gross per 24 hour  Intake 176.76 ml  Output 2040 ml  Net -1863.24 ml    Constitutional :  alert, cooperative, appears stated age, and no distress  Respiratory:  clear to auscultation bilaterally  Cardiovascular:  regular rate and rhythm  Gastrointestinal: Soft, no guarding, no change in distention, increasing tenderness now diffuse \ .   Skin: Cool and moist. Incision dressing c/d/I.  Peripheral edema noted especially in hands bilaterally.  Psychiatric: Normal affect, non-agitated, not confused       LABS:     Latest Ref Rng & Units 04/09/2022    4:25 AM 04/08/2022    4:08 AM 04/07/2022    4:14 AM  CMP  Glucose 70 - 99 mg/dL  92  106  151   BUN 8 - 23 mg/dL 36  32  34   Creatinine 0.61 - 1.24 mg/dL 2.23  1.84  1.77   Sodium 135 - 145 mmol/L 141  136  140   Potassium 3.5 - 5.1 mmol/L 4.4  4.8  4.8   Chloride 98 - 111 mmol/L 110  110  110   CO2 22 - 32 mmol/L '27  23  25   '$ Calcium 8.9 - 10.3 mg/dL 7.3  7.3  7.2   Total Protein 6.5 - 8.1 g/dL  5.5    Total Bilirubin 0.3 - 1.2 mg/dL  0.6    Alkaline Phos 38 - 126 U/L  54    AST 15 - 41 U/L  13    ALT 0 - 44 U/L  10        Latest Ref Rng & Units 04/09/2022    4:25 AM 04/08/2022    4:08 AM 04/07/2022    4:14 AM  CBC  WBC 4.0 - 10.5 K/uL 15.7  14.5  16.7   Hemoglobin 13.0 - 17.0 g/dL 9.8  10.9  9.8   Hematocrit 39.0 - 52.0 % 32.6  36.6  32.6   Platelets 150 - 400 K/uL 86  84  96     RADS: CLINICAL DATA:  Abdominal pain. Lethargy. Recent laparoscopic colon resection.   EXAM: CT ABDOMEN AND PELVIS WITHOUT CONTRAST   TECHNIQUE: Multidetector CT imaging of the abdomen and pelvis was performed following the standard protocol without IV contrast.   RADIATION DOSE  REDUCTION: This exam was performed according to the departmental dose-optimization program which includes automated exposure control, adjustment of the mA and/or kV according to patient size and/or use of iterative reconstruction technique.   COMPARISON:  03/01/2022   FINDINGS: Lower chest: Interval progression of bibasilar collapse/consolidation with progression of bilateral pleural effusions now moderate bilaterally.   Hepatobiliary: No suspicious focal abnormality in the liver on this study without intravenous contrast. There is no evidence for gallstones, gallbladder wall thickening, or pericholecystic fluid. No intrahepatic or extrahepatic biliary dilation.   Pancreas: No focal mass lesion. No dilatation of the main duct. No intraparenchymal cyst. No peripancreatic edema.   Spleen: No splenomegaly. No focal mass lesion.   Adrenals/Urinary Tract: No adrenal nodule or mass. No  evidence for hydroureter. Bladder is distended despite the presence of a Foley catheter.   Stomach/Bowel: Stomach is nondistended. Duodenum is normally positioned as is the ligament of Treitz. No small bowel wall thickening. No small bowel dilatation. The terminal ileum is normal. The appendix is not well visualized, but there is no edema or inflammation in the region of the cecum. Anastomosis noted in the region of the rectosigmoid junction. Edema in the pelvic floor around the rectum is presumably postsurgical.   Vascular/Lymphatic: There is moderate atherosclerotic calcification of the abdominal aorta without aneurysm. There is no gastrohepatic or hepatoduodenal ligament lymphadenopathy. No retroperitoneal or mesenteric lymphadenopathy. No pelvic sidewall lymphadenopathy.   Reproductive: The prostate gland and seminal vesicles are unremarkable.   Other: Small volume free fluid is seen in paracolic gutters bilaterally. Moderate volume intraperitoneal free air is associated with subcutaneous gas in the anterior abdominal wall, left greater than right with some edema in the left lateral abdominal wall and subcutaneous edema in the pelvis bilaterally. Extraperitoneal gas is seen in the pelvis.   Musculoskeletal: No worrisome lytic or sclerotic osseous abnormality.   IMPRESSION: 1. Moderate volume intraperitoneal free air is associated with subcutaneous gas in the anterior abdominal wall, left greater than right with subcutaneous edema in the abdominal wall of the lower abdomen and pelvis bilaterally. Extraperitoneal gas is seen in the pelvis. These findings are compatible with recent surgery is the patient is status post sigmoidectomy on 04/05/2022. 2. Interval progression of bibasilar collapse/consolidation with progression of bilateral pleural effusions now moderate bilaterally. 3. Bladder is distended despite the presence of a Foley catheter. Correlation for Foley catheter  dysfunction recommended. 4. Small volume free fluid in the paracolic gutters bilaterally. Likely postsurgical. 5.  Aortic Atherosclerosis (ICD10-I70.0).     Electronically Signed   By: Misty Stanley M.D.   On: 04/09/2022 10:39   Assessment:   S/p robotic lap sigmoidectomy for unresectable tubulovillous adenoma.  Blood pressure started to drop again overnight and with the worsening mentation and increased leukocytosis, stat CT scan was ordered with the results noted above.  Although the report notes the intraperitoneal air may be related to the recent surgery, overall clinical picture is extremely concerning for a anastomotic leak.  Due to his lung findings as noted above along with his baseline status at this point, return to the OR will be extremely morbid, potentially fatal.  Even if short-term he is able to survive the operation, long-term quality of life will likely be severely diminished due to the prolonged hospitalization required afterwards.  Had an extensive discussion with the patient, daughter, and son who is listed as the power of attorney.  Patient eventually verbalized that he will like to not be in pain, and focus solely on  being comfortable rather than pursuing aggressive measures with small hopes of complete recovery.  We will proceed with palliative care consult at this point.  Continue current management with IV medications, okay to continue with clear liquid diet for now.  Daughter, and son, along with patient all verbalized undrestanding  labs/images/medications/previous chart entries reviewed personally and relevant changes/updates noted above.

## 2022-04-09 NOTE — Care Management Important Message (Signed)
Important Message  Patient Details  Name: Micheal [name] MRN: [id number] Date of Birth: 03-Apr-1933   Medicare Important Message Given:  Yes     Dannette Barbara 04/09/2022, 10:59 AM

## 2022-04-09 NOTE — Progress Notes (Signed)
Pt HR runs from 118-125 RR 28. Pt turns from yellow to red mews. NP Foust made aware and ICU charge nurse came assessed. Will continue to monitor.

## 2022-04-09 NOTE — Progress Notes (Signed)
PROGRESS NOTE    Micheal [name]  WVP:[id number] DOB: 1933-04-27 DOA: 04/02/2022 PCP: Howard Pouch, NP    Brief Narrative:  88 year old with history of COPD, CAD, CKD, DM 2, HTN, tubulovillous adenoma of colon tobacco use admitted on 11/13 for laparoscopic colon resection, which was done 11/16.  Medical team consulted for medical management especially with blood glucose.  Hospitalist complicated by hypotension on the evening of 11/17.  Treated with IV fluids with noted lactic acidosis, white blood cell count.  Hypotension persisting into 11/18 and mildly elevated procalcitonin and patient started on IV Zosyn 11/18.  By 11/19, hypotension better after patient on fluids plus midodrine.  White blood cell count trending downward.  Noted to have mild oxygen desaturation and BNP checked and found to be elevated at 945.  No previous history of heart failure, so fluids discontinued, started on gentle IV Lasix and echocardiogram done noting preserved ejection fraction and indeterminate diastolic function.  On 11/20, patient noted to be more somnolent with worsening renal function and creatinine of 2.23 and white blood cell count at 15.7.  CT scan of abdomen and pelvis ordered by general surgery notes worsening anastomotic leak.  (Also noted to have distended bladder and after surgery repositioned Foley catheter, large amount of urine presented.)  General surgery spoke with patient's children about concerns that patient would not survive further surgery and plan is for palliative care, with possible transition to comfort care.    Assessment & Plan:   Active Problems:   DM (diabetes mellitus), type 2, uncontrolled, with hyperosmolarity ([place])   Acute renal failure superimposed on stage 3b chronic kidney disease (HCC)   Thrombocytopenia (HCC)   CAD (coronary artery disease)   COPD (chronic obstructive pulmonary disease) (HCC)   Erosive gastritis   HTN (hypertension)   Alcohol abuse Colon mass,  tubulovillous adenoma, course now complicated for anastomotic leak - Suspicion for malignancy.   Initially admitted under general surgery during this admission for laparoscopic resection.   Management per surgery Now with signs consistent with new leak.  Very high mortality risk for surgical correction.  Case discussed with patient's children and no plans for surgery.  At this time, palliative care and transition to possible comfort care.  Severe sepsis Meets criteria for severe sepsis given anastomotic leak as source with hypotension, leukocytosis and tachycardia.  Put on Zosyn although unfortunately, with leak persisting, he likely will not recover from this.  Acute diastolic heart failure unclassified: No previous history of heart failure.  BNP elevated at 945, following aggressive fluid resuscitation.  Have stopped IV fluids and started IV Lasix.  Echocardiogram notes indeterminate ejection fraction.  See below.  DM (diabetes mellitus), type 2, uncontrolled, with hyperosmolarity ([place]) Poorly controlled last A1c is 10.8.   Patient's home medication Lantus 16 units daily, glipizide 2.5 mg twice daily, Trijardy. Sugars labile.  Some episodes of hypoglycemia.  With adjustments in long-acting insulin, CBGs are better.   Acute renal failure superimposed on stage 3b chronic kidney disease ([place]) Worsening creatinine likely due to hypotension and infection as above.  Creatinine continue to worsen, likely multifactorial from bladder obstruction, decompensated heart failure and anastomotic leak.  Foley adjusted and large amount of urine expressed.  Continue diuresis although that may be changed for goals of care.  COPD (chronic obstructive pulmonary disease) (HCC) Mild abnormal breath sounds Breath sounds improved.   Air movement coarse but no obvious wheezing.   Patient on room air.  Continue prn bronchodilator regimen.   Thrombocytopenia ([place])  Appears to be chronic.   No active signs of  bleeding.  Continue to monitor.   Okay for chemo DVT prophylaxis if necessary postoperatively depending on surgical course      CAD (coronary artery disease) No chest pain.  Normally on Toprol, held secondary to hypotension.     Erosive gastritis PPI twice daily   HTN (hypertension) Initially on Toprol, held secondary to hypotension   Remote Hx of EtOH abuse, currently no signs of withdrawal.   Thank you for this consult.  [facility] hospitalist service to continue following patient with you.  Recommendations discussed with primary attending Dr. Lysle Pearl.  DVT prophylaxis: Per general surgery Code Status: Full Family Communication: None Disposition Plan: Per general surgery Level of care: Progressive  Consultants:  Hospitalist  Procedures:  Status post laparoscopic colonic resection Echocardiogram with preserved ejection fraction, indeterminate diastolic function  Antimicrobials: Flagyl Erythromycin Now on Zosyn 11/18-present   Subjective: Somnolent  Objective: Vitals:   04/09/22 1000 04/09/22 1050 04/09/22 1218 04/09/22 1428  BP: (!) 93/42 (!) 102/46 (!) 117/51 (!) 114/51  Pulse: 92 95 97 (!) 101  Resp:    20  Temp:    97.8 F (36.6 C)  TempSrc:    Oral  SpO2: 99% 99% 99% 93%  Weight:      Height:        Intake/Output Summary (Last 24 hours) at 04/09/2022 1541 Last data filed at 04/09/2022 0603 Gross per 24 hour  Intake 176.76 ml  Output 1200 ml  Net -1023.24 ml    Filed Weights   04/02/22 2038 04/05/22 1219  Weight: 60.1 kg 60.1 kg    Examination:  General exam: More somnolent HEENT: Normocephalic, atraumatic.  Sclera bilaterally injected Respiratory system: Decreased breath sounds bibasilar Cardiovascular system: Regular rate and rhythm, S1-S2 Gastrointestinal system: Status post colonic resection Central nervous system: Alert and oriented. No focal neurological deficits. Extremities: No clubbing or cyanosis, trace pitting edema Skin: No rashes,  lesions or ulcers Psychiatry: Drowsy    Data Reviewed: I have personally reviewed following labs and imaging studies Increasing white blood cell count and worsening renal function CBC: Recent Labs  Lab 04/06/22 1444 04/06/22 1939 04/07/22 0414 04/08/22 0408 04/09/22 0425  WBC 20.5* 20.4* 16.7* 14.5* 15.7*  NEUTROABS 17.1* 16.7*  --   --   --   HGB 10.4* 10.3* 9.8* 10.9* 9.8*  HCT 33.7* 34.2* 32.6* 36.6* 32.6*  MCV 100.6* 101.2* 102.2* 102.8* 99.7  PLT 115* 99* 96* 84* 86*    Basic Metabolic Panel: Recent Labs  Lab 04/02/22 2022 04/03/22 0929 04/05/22 0538 04/06/22 0415 04/06/22 1444 04/06/22 1939 04/07/22 0414 04/08/22 0408 04/09/22 0425  NA 139   < > 140 144 143 139 140 136 141  K 4.6   < > 4.4 4.9 6.0* 4.8 4.8 4.8 4.4  CL 100   < > 105 110 110 108 110 110 110  CO2 32   < > '26 29 28 24 25 23 27  '$ GLUCOSE 154*   < > 115* 133* 95 203* 151* 106* 92  BUN 40*   < > 30* 27* 33* 31* 34* 32* 36*  CREATININE 1.29*   < > 1.17 1.42* 1.69* 1.70* 1.77* 1.84* 2.23*  CALCIUM 8.5*   < > 8.5* 7.9* 7.7* 7.4* 7.2* 7.3* 7.3*  MG 1.7   < > 2.3 2.2  --   --  2.0 1.9 1.8  PHOS 3.0  --   --   --   --   --   --   --   --    < > =  values in this interval not displayed.    GFR: Estimated Creatinine Clearance: 19.8 mL/min (A) (by C-G formula based on SCr of 2.23 mg/dL (H)). Liver Function Tests: Recent Labs  Lab 04/02/22 2022 04/08/22 0408  AST 17 13*  ALT 15 10  ALKPHOS 61 54  BILITOT 0.6 0.6  PROT 6.6 5.5*  ALBUMIN 3.2* 2.5*    No results for input(s): "LIPASE", "AMYLASE" in the last 168 hours. No results for input(s): "AMMONIA" in the last 168 hours. Coagulation Profile: No results for input(s): "INR", "PROTIME" in the last 168 hours. Cardiac Enzymes: No results for input(s): "CKTOTAL", "CKMB", "CKMBINDEX", "TROPONINI" in the last 168 hours. BNP (last 3 results) No results for input(s): "PROBNP" in the last 8760 hours. HbA1C: No results for input(s): "HGBA1C" in the  last 72 hours. CBG: Recent Labs  Lab 04/08/22 1621 04/08/22 2139 04/09/22 0805 04/09/22 0907 04/09/22 1332  GLUCAP 226* 126* 87 88 91    Lipid Profile: No results for input(s): "CHOL", "HDL", "LDLCALC", "TRIG", "CHOLHDL", "LDLDIRECT" in the last 72 hours. Thyroid Function Tests: No results for input(s): "TSH", "T4TOTAL", "FREET4", "T3FREE", "THYROIDAB" in the last 72 hours. Anemia Panel: No results for input(s): "VITAMINB12", "FOLATE", "FERRITIN", "TIBC", "IRON", "RETICCTPCT" in the last 72 hours. Sepsis Labs: Recent Labs  Lab 04/06/22 1444 04/06/22 1719 04/06/22 1939 04/07/22 0414 04/07/22 0851 04/08/22 0408  PROCALCITON  --   --   --  0.27  --  0.21  LATICACIDVEN 2.8* 2.5* 2.0*  --  1.4  --      Recent Results (from the past 240 hour(s))  Surgical PCR screen     Status: None   Collection Time: 04/04/22 10:42 PM   Specimen: Nasal Mucosa; Nasal Swab  Result Value Ref Range Status   MRSA, PCR NEGATIVE NEGATIVE Final   Staphylococcus aureus NEGATIVE NEGATIVE Final    Comment: (NOTE) The Xpert SA Assay (FDA approved for NASAL specimens in patients 23 years of age and older), is one component of a comprehensive surveillance program. It is not intended to diagnose infection nor to guide or monitor treatment. Performed at [facility], [address] [place], [place] [address]   Culture, blood (Routine X 2) w Reflex to ID Panel     Status: None (Preliminary result)   Collection Time: 04/07/22  6:30 PM   Specimen: BLOOD  Result Value Ref Range Status   Specimen Description BLOOD BLOOD LEFT HAND  Final   Special Requests   Final    BOTTLES DRAWN AEROBIC AND ANAEROBIC Blood Culture adequate volume   Culture   Final    NO GROWTH 2 DAYS Performed at [facility], [address], [place], [place] [address]    Report Status PENDING  Incomplete  Culture, blood (Routine X 2) w Reflex to ID Panel     Status: None (Preliminary result)   Collection Time:  04/07/22  6:36 PM   Specimen: BLOOD  Result Value Ref Range Status   Specimen Description BLOOD BLOOD RIGHT HAND  Final   Special Requests   Final    BOTTLES DRAWN AEROBIC AND ANAEROBIC Blood Culture adequate volume   Culture   Final    NO GROWTH 2 DAYS Performed at [facility], [address] [place], [place] [address]    Report Status PENDING  Incomplete         Radiology Studies: ECHOCARDIOGRAM COMPLETE  Result Date: 04/09/2022    ECHOCARDIOGRAM REPORT   Patient Name:   Micheal [name]  Yontz Date of Exam: 04/09/2022 Medical Rec #:  [id number]       Height:       67.0 in Accession #:    [id number]      Weight:       132.4 lb Date of Birth:  1933/01/18       BSA:          1.697 m Patient Age:    55 years        BP:           96/54 mmHg Patient Gender: M               HR:           100 bpm. Exam Location:  [facility] Procedure: 2D Echo, Cardiac Doppler and Color Doppler Indications:     CHF-acute diastolic N39.76  History:         Patient has no prior history of Echocardiogram examinations.                  COPD; Risk Factors:Diabetes and Hypertension.  Sonographer:     Sherrie Sport Referring Phys:  [place] Diagnosing Phys: Kathlyn Sacramento MD  Sonographer Comments: Suboptimal apical window and suboptimal subcostal window. Image acquisition challenging due to COPD. IMPRESSIONS  1. Left ventricular ejection fraction, by estimation, is 55 to 60%. The left ventricle has normal function. The left ventricle has no regional wall motion abnormalities. There is mild left ventricular hypertrophy. Left ventricular diastolic parameters are indeterminate.  2. Right ventricular systolic function is normal. The right ventricular size is normal. Tricuspid regurgitation signal is inadequate for assessing PA pressure.  3. The mitral valve is normal in structure. No evidence of mitral valve regurgitation. No evidence of mitral stenosis.  4. The aortic valve is normal in structure. Aortic valve  regurgitation is not visualized. No aortic stenosis is present.  5. The inferior vena cava is normal in size with greater than 50% respiratory variability, suggesting right atrial pressure of 3 mmHg. FINDINGS  Left Ventricle: Left ventricular ejection fraction, by estimation, is 55 to 60%. The left ventricle has normal function. The left ventricle has no regional wall motion abnormalities. The left ventricular internal cavity size was normal in size. There is  mild left ventricular hypertrophy. Left ventricular diastolic parameters are indeterminate. Right Ventricle: The right ventricular size is normal. No increase in right ventricular wall thickness. Right ventricular systolic function is normal. Tricuspid regurgitation signal is inadequate for assessing PA pressure. The tricuspid regurgitant velocity is 2.37 m/s, and with an assumed right atrial pressure of 3 mmHg, the estimated right ventricular systolic pressure is 73.4 mmHg. Left Atrium: Left atrial size was normal in size. Right Atrium: Right atrial size was normal in size. Pericardium: There is no evidence of pericardial effusion. Mitral Valve: The mitral valve is normal in structure. No evidence of mitral valve regurgitation. No evidence of mitral valve stenosis. Tricuspid Valve: The tricuspid valve is normal in structure. Tricuspid valve regurgitation is not demonstrated. No evidence of tricuspid stenosis. Aortic Valve: The aortic valve is normal in structure. Aortic valve regurgitation is not visualized. No aortic stenosis is present. Aortic valve mean gradient measures 2.0 mmHg. Aortic valve peak gradient measures 4.4 mmHg. Aortic valve area, by VTI measures 2.58 cm. Pulmonic Valve: The pulmonic valve was normal in structure. Pulmonic valve regurgitation is not visualized. No evidence of pulmonic stenosis. Aorta: The aortic root is normal in size and structure. Venous: The inferior vena cava  is normal in size with greater than 50% respiratory  variability, suggesting right atrial pressure of 3 mmHg. IAS/Shunts: No atrial level shunt detected by color flow Doppler. Additional Comments: There is a small pleural effusion in the left lateral region.  LEFT VENTRICLE PLAX 2D LVIDd:         3.30 cm   Diastology LVIDs:         2.40 cm   LV e' medial:    5.87 cm/s LV PW:         1.30 cm   LV E/e' medial:  16.4 LV IVS:        1.30 cm   LV e' lateral:   6.31 cm/s LVOT diam:     2.00 cm   LV E/e' lateral: 15.3 LV SV:         48 LV SV Index:   28 LVOT Area:     3.14 cm  RIGHT VENTRICLE RV Basal diam:  3.40 cm RV Mid diam:    3.20 cm RV S prime:     14.60 cm/s TAPSE (M-mode): 2.5 cm LEFT ATRIUM           Index        RIGHT ATRIUM           Index LA diam:      3.30 cm 1.94 cm/m   RA Area:     15.90 cm LA Vol (A4C): 47.0 ml 27.69 ml/m  RA Volume:   39.80 ml  23.45 ml/m  AORTIC VALVE AV Area (Vmax):    2.08 cm AV Area (Vmean):   2.23 cm AV Area (VTI):     2.58 cm AV Vmax:           105.00 cm/s AV Vmean:          71.100 cm/s AV VTI:            0.186 m AV Peak Grad:      4.4 mmHg AV Mean Grad:      2.0 mmHg LVOT Vmax:         69.50 cm/s LVOT Vmean:        50.500 cm/s LVOT VTI:          0.153 m LVOT/AV VTI ratio: 0.82  AORTA Ao Root diam: 3.43 cm MITRAL VALVE                TRICUSPID VALVE MV Area (PHT): 3.46 cm     TR Peak grad:   22.5 mmHg MV Decel Time: 219 msec     TR Vmax:        237.00 cm/s MV E velocity: 96.40 cm/s MV A velocity: 121.00 cm/s  SHUNTS MV E/A ratio:  0.80         Systemic VTI:  0.15 m                             Systemic Diam: 2.00 cm Kathlyn Sacramento MD Electronically signed by Kathlyn Sacramento MD Signature Date/Time: 04/09/2022/11:18:15 AM    Final    CT ABDOMEN PELVIS WO CONTRAST  Result Date: 04/09/2022 CLINICAL DATA:  Abdominal pain. Lethargy. Recent laparoscopic colon resection. EXAM: CT ABDOMEN AND PELVIS WITHOUT CONTRAST TECHNIQUE: Multidetector CT imaging of the abdomen and pelvis was performed following the standard protocol without  IV contrast. RADIATION DOSE REDUCTION: This exam was performed according to the departmental dose-optimization program which includes automated exposure control, adjustment of the mA  and/or kV according to patient size and/or use of iterative reconstruction technique. COMPARISON:  03/01/2022 FINDINGS: Lower chest: Interval progression of bibasilar collapse/consolidation with progression of bilateral pleural effusions now moderate bilaterally. Hepatobiliary: No suspicious focal abnormality in the liver on this study without intravenous contrast. There is no evidence for gallstones, gallbladder wall thickening, or pericholecystic fluid. No intrahepatic or extrahepatic biliary dilation. Pancreas: No focal mass lesion. No dilatation of the main duct. No intraparenchymal cyst. No peripancreatic edema. Spleen: No splenomegaly. No focal mass lesion. Adrenals/Urinary Tract: No adrenal nodule or mass. No evidence for hydroureter. Bladder is distended despite the presence of a Foley catheter. Stomach/Bowel: Stomach is nondistended. Duodenum is normally positioned as is the ligament of Treitz. No small bowel wall thickening. No small bowel dilatation. The terminal ileum is normal. The appendix is not well visualized, but there is no edema or inflammation in the region of the cecum. Anastomosis noted in the region of the rectosigmoid junction. Edema in the pelvic floor around the rectum is presumably postsurgical. Vascular/Lymphatic: There is moderate atherosclerotic calcification of the abdominal aorta without aneurysm. There is no gastrohepatic or hepatoduodenal ligament lymphadenopathy. No retroperitoneal or mesenteric lymphadenopathy. No pelvic sidewall lymphadenopathy. Reproductive: The prostate gland and seminal vesicles are unremarkable. Other: Small volume free fluid is seen in paracolic gutters bilaterally. Moderate volume intraperitoneal free air is associated with subcutaneous gas in the anterior abdominal wall, left  greater than right with some edema in the left lateral abdominal wall and subcutaneous edema in the pelvis bilaterally. Extraperitoneal gas is seen in the pelvis. Musculoskeletal: No worrisome lytic or sclerotic osseous abnormality. IMPRESSION: 1. Moderate volume intraperitoneal free air is associated with subcutaneous gas in the anterior abdominal wall, left greater than right with subcutaneous edema in the abdominal wall of the lower abdomen and pelvis bilaterally. Extraperitoneal gas is seen in the pelvis. These findings are compatible with recent surgery is the patient is status post sigmoidectomy on 04/05/2022. 2. Interval progression of bibasilar collapse/consolidation with progression of bilateral pleural effusions now moderate bilaterally. 3. Bladder is distended despite the presence of a Foley catheter. Correlation for Foley catheter dysfunction recommended. 4. Small volume free fluid in the paracolic gutters bilaterally. Likely postsurgical. 5.  Aortic Atherosclerosis (ICD10-I70.0). Electronically Signed   By: Misty Stanley M.D.   On: 04/09/2022 10:39   DG Abd 1 View  Result Date: 04/08/2022 CLINICAL DATA:  Abdominal distension EXAM: ABDOMEN - 1 VIEW COMPARISON:  03/01/2022 FINDINGS: Generalized gaseous distension of large and small bowel. No gross free intraperitoneal air. Multiple surgical staples overlie the abdomen and pelvis. No radio-opaque calculi or other significant radiographic abnormality are seen. IMPRESSION: Generalized gaseous distension of large and small bowel, which may reflect ileus. Electronically Signed   By: Davina Poke D.O.   On: 04/08/2022 11:08        Scheduled Meds:  aspirin EC  81 mg Oral Daily   atorvastatin  40 mg Oral Daily   enoxaparin (LOVENOX) injection  30 mg Subcutaneous Q24H   furosemide  20 mg Intravenous BID   gabapentin  300 mg Oral BID   insulin aspart  0-9 Units Subcutaneous TID WC   insulin glargine-yfgn  8 Units Subcutaneous Daily    lipase/protease/amylase  36,000 Units Oral TID AC   midodrine  10 mg Oral TID WC   multivitamin with minerals  1 tablet Oral Daily   nicotine  21 mg Transdermal Daily   pantoprazole  40 mg Oral BID AC   triamcinolone ointment  Topical BID   Continuous Infusions:  sodium chloride Stopped (04/09/22 0517)   piperacillin-tazobactam (ZOSYN)  IV 12.5 mL/hr at 04/09/22 0603     LOS: 7 days    Annita Brod, MD [facility]   If 7PM-7AM, please contact night-coverage  04/09/2022, 3:41 PM

## 2022-04-09 NOTE — Progress Notes (Signed)
Report called to Anderson Malta, RN on 2A. Awaiting bed closer to nursing station on 2A. RN will call back once bed is available.

## 2022-04-09 NOTE — Progress Notes (Addendum)
OT Cancellation Note  Patient Details Name: Micheal [name] MRN: [id number] DOB: [birth year]   Cancelled Treatment:    Reason Eval/Treat Not Completed: Other (comment). OT orders  received, chart reviewed. Speaking with RN to get clearance to work with pt. RN reported change in pt 's cognition/alertness this morning. Pt is lethargic, unable to follow commands, or participate in activity at this time. RN reports MD is aware. Will re-attempt evaluation as able.  Doneta Public 04/09/2022, 9:08 AM

## 2022-04-09 NOTE — Progress Notes (Signed)
*  PRELIMINARY RESULTS* Echocardiogram 2D Echocardiogram has been performed.  Sherrie Sport 04/09/2022, 7:49 AM

## 2022-04-10 DIAGNOSIS — A419 Sepsis, unspecified organism: Secondary | ICD-10-CM | POA: Diagnosis not present

## 2022-04-10 DIAGNOSIS — R6520 Severe sepsis without septic shock: Secondary | ICD-10-CM | POA: Diagnosis not present

## 2022-04-10 DIAGNOSIS — I10 Essential (primary) hypertension: Secondary | ICD-10-CM | POA: Diagnosis not present

## 2022-04-10 DIAGNOSIS — Z515 Encounter for palliative care: Secondary | ICD-10-CM | POA: Diagnosis not present

## 2022-04-10 LAB — LACTIC ACID, PLASMA: Lactic Acid, Venous: 1.5 mmol/L (ref 0.5–1.9)

## 2022-04-10 LAB — BLOOD GAS, ARTERIAL
Acid-Base Excess: 1.2 mmol/L (ref 0.0–2.0)
Bicarbonate: 28.7 mmol/L — ABNORMAL HIGH (ref 20.0–28.0)
O2 Content: 3 L/min
O2 Saturation: 97.5 %
Patient temperature: 37
pCO2 arterial: 57 mmHg — ABNORMAL HIGH (ref 32–48)
pH, Arterial: 7.31 — ABNORMAL LOW (ref 7.35–7.45)
pO2, Arterial: 80 mmHg — ABNORMAL LOW (ref 83–108)

## 2022-04-10 LAB — CBC
HCT: 34.3 % — ABNORMAL LOW (ref 39.0–52.0)
Hemoglobin: 10.8 g/dL — ABNORMAL LOW (ref 13.0–17.0)
MCH: 30.7 pg (ref 26.0–34.0)
MCHC: 31.5 g/dL (ref 30.0–36.0)
MCV: 97.4 fL (ref 80.0–100.0)
Platelets: 101 10*3/uL — ABNORMAL LOW (ref 150–400)
RBC: 3.52 MIL/uL — ABNORMAL LOW (ref 4.22–5.81)
RDW: 16.2 % — ABNORMAL HIGH (ref 11.5–15.5)
WBC: 17.5 10*3/uL — ABNORMAL HIGH (ref 4.0–10.5)
nRBC: 0 % (ref 0.0–0.2)

## 2022-04-10 LAB — BASIC METABOLIC PANEL
Anion gap: 11 (ref 5–15)
BUN: 38 mg/dL — ABNORMAL HIGH (ref 8–23)
CO2: 24 mmol/L (ref 22–32)
Calcium: 7.6 mg/dL — ABNORMAL LOW (ref 8.9–10.3)
Chloride: 107 mmol/L (ref 98–111)
Creatinine, Ser: 2.08 mg/dL — ABNORMAL HIGH (ref 0.61–1.24)
GFR, Estimated: 30 mL/min — ABNORMAL LOW (ref >60)
Glucose, Bld: 107 mg/dL — ABNORMAL HIGH (ref 70–99)
Potassium: 4.3 mmol/L (ref 3.5–5.1)
Sodium: 142 mmol/L (ref 135–145)

## 2022-04-10 LAB — MAGNESIUM: Magnesium: 1.9 mg/dL (ref 1.7–2.4)

## 2022-04-10 LAB — PROCALCITONIN: Procalcitonin: 7.98 ng/mL

## 2022-04-10 MED ORDER — HYDROMORPHONE HCL 1 MG/ML IJ SOLN
2.0000 mg | INTRAMUSCULAR | Status: DC | PRN
  Filled 2022-04-10: qty 2

## 2022-04-10 MED ORDER — HYDROMORPHONE HCL 1 MG/ML IJ SOLN: 2.0000 mg | INTRAMUSCULAR | Status: DC | PRN

## 2022-04-10 MED ORDER — ACETAMINOPHEN 650 MG RE SUPP: 650.0000 mg | Freq: Four times a day (QID) | RECTAL | Status: DC | PRN

## 2022-04-10 MED ORDER — GLYCOPYRROLATE 0.2 MG/ML IJ SOLN
0.2000 mg | INTRAMUSCULAR | Status: DC | PRN
  Administered 2022-04-11: 0.2 mg via SUBCUTANEOUS
  Filled 2022-04-10: qty 1

## 2022-04-10 MED ORDER — CHLORHEXIDINE GLUCONATE CLOTH 2 % EX PADS: 6.0000 | MEDICATED_PAD | Freq: Every day | CUTANEOUS | Status: DC

## 2022-04-10 MED ORDER — METOPROLOL TARTRATE 5 MG/5ML IV SOLN
5.0000 mg | Freq: Four times a day (QID) | INTRAVENOUS | Status: DC | PRN
  Administered 2022-04-10: 5 mg via INTRAVENOUS
  Filled 2022-04-10: qty 5

## 2022-04-10 MED ORDER — GLYCOPYRROLATE 0.2 MG/ML IJ SOLN
0.2000 mg | INTRAMUSCULAR | Status: DC | PRN
  Administered 2022-04-12: 0.2 mg via INTRAVENOUS
  Filled 2022-04-10 (×2): qty 1

## 2022-04-10 MED ORDER — GLYCOPYRROLATE 1 MG PO TABS
1.0000 mg | ORAL_TABLET | ORAL | Status: DC | PRN
  Filled 2022-04-10: qty 1

## 2022-04-10 MED ORDER — OXYCODONE HCL 5 MG PO TABS
5.0000 mg | ORAL_TABLET | ORAL | Status: DC | PRN
  Administered 2022-04-10 – 2022-04-11 (×2): 5 mg via ORAL
  Filled 2022-04-10 (×2): qty 1

## 2022-04-10 MED ORDER — ACETAMINOPHEN 325 MG PO TABS: 650.0000 mg | ORAL_TABLET | Freq: Four times a day (QID) | ORAL | Status: DC | PRN

## 2022-04-10 MED ORDER — FUROSEMIDE 10 MG/ML IJ SOLN
20.0000 mg | Freq: Two times a day (BID) | INTRAMUSCULAR | Status: DC
  Administered 2022-04-10 – 2022-04-11 (×3): 20 mg via INTRAVENOUS
  Filled 2022-04-10 (×2): qty 2
  Filled 2022-04-10: qty 4

## 2022-04-10 MED ORDER — HYDROMORPHONE HCL 1 MG/ML IJ SOLN
1.0000 mg | INTRAMUSCULAR | Status: DC | PRN
  Administered 2022-04-10: 1 mg via INTRAVENOUS

## 2022-04-10 MED ORDER — ORAL CARE MOUTH RINSE: 15.0000 mL | OROMUCOSAL | Status: DC | PRN

## 2022-04-10 NOTE — Progress Notes (Signed)
       CROSS COVER NOTE  NAME: Micheal [name] MRN: [id number] DOB : [birth year] ATTENDING PHYSICIAN: Benjamine Sprague, DO    Date of Service   04/10/2022   HPI/Events of Note   Paged by Sheryle Spray RN regarding General Surgery patient that [facility] is consulted on for Red MEWS. Per today's notes there are ongoing goals of care conversations and this patient may be close to transitioning to comfort care. Recommended RN reach out to patient's primary team first. Happy to assist as needed.  Interventions   Assessment/Plan:  Per surgery team     This document was prepared using Dragon voice recognition software and may include unintentional dictation errors.  Neomia Glass DNP, MBA, FNP-BC Nurse Practitioner [facility] Pager [phone]

## 2022-04-10 NOTE — Progress Notes (Signed)
  Chaplain On-Call responded to Trail Order from Gevena Barre, MD, for support of the patient's family regarding comfort care decisions.  Chaplain met the patient with family at the bedside, as they were receiving information from RN Brittney about comfort care decisions the patient is considering.  Chaplain provided brief spiritual and emotional support, and then took leave to respond to a Code Blue in another area.  Chaplain assured patient and family of further availability as needed.  Chaplain Pollyann Samples M.Div., [facility]

## 2022-04-10 NOTE — Plan of Care (Signed)
°  Problem: Clinical Measurements: °Goal: Respiratory complications will improve °Outcome: Progressing °  °Problem: Clinical Measurements: °Goal: Cardiovascular complication will be avoided °Outcome: Progressing °  °Problem: Pain Managment: °Goal: General experience of comfort will improve °Outcome: Progressing °  °Problem: Safety: °Goal: Ability to remain free from injury will improve °Outcome: Progressing °  °

## 2022-04-10 NOTE — Progress Notes (Signed)
Subjective:  CC: Micheal [name] is a 88 y.o. male  Hospital stay day 8, 5 Days Post-Op robotic lap sigmoidectomy for unresectable tubulovillous adenoma  HPI: More alert this am.  Complains of abdominal pain.  ROS:  General: Denies weight loss, weight gain, fatigue, fevers, chills, and night sweats. Heart: Denies chest pain, palpitations, racing heart, irregular heartbeat, leg pain or swelling, and decreased activity tolerance. Respiratory: Denies breathing difficulty, shortness of breath, wheezing, cough, and sputum. GI: Denies change in appetite, heartburn, nausea, vomiting, constipation, diarrhea, and blood in stool. GU: Denies difficulty urinating, pain with urinating, urgency, frequency, blood in urine.   Objective:   Temp:  [97.8 F (36.6 C)-99.9 F (37.7 C)] 98.5 F (36.9 C) (11/21 0342) Pulse Rate:  [89-120] 101 (11/21 0342) Resp:  [14-33] 29 (11/21 0342) BP: (92-132)/(40-60) 132/56 (11/21 0342) SpO2:  [93 %-100 %] 99 % (11/21 0342) Weight:  [66.4 kg] 66.4 kg (11/21 0352)     Height: '5\' 7"'$  (170.2 cm) Weight: 66.4 kg (1 pillow present) BMI (Calculated): 22.92   Intake/Output this shift:   Intake/Output Summary (Last 24 hours) at 04/10/2022 0735 Last data filed at 04/10/2022 0530 Gross per 24 hour  Intake 240 ml  Output 1550 ml  Net -1310 ml    Constitutional :  alert, cooperative, appears stated age, and no distress  Respiratory:  clear to auscultation bilaterally  Cardiovascular:  regular rate and rhythm  Gastrointestinal: Abdomen with guarding, TTP, diffuse, consistent with peritonitis.   Skin: Cool and moist. Incision dressing c/d/I.  Peripheral edema noted especially in hands bilaterally.  Psychiatric: Normal affect, non-agitated, not confused       LABS:     Latest Ref Rng & Units 04/10/2022    5:09 AM 04/09/2022    4:25 AM 04/08/2022    4:08 AM  CMP  Glucose 70 - 99 mg/dL 107  92  106   BUN 8 - 23 mg/dL 38  36  32   Creatinine 0.61 - 1.24 mg/dL 2.08   2.23  1.84   Sodium 135 - 145 mmol/L 142  141  136   Potassium 3.5 - 5.1 mmol/L 4.3  4.4  4.8   Chloride 98 - 111 mmol/L 107  110  110   CO2 22 - 32 mmol/L '24  27  23   '$ Calcium 8.9 - 10.3 mg/dL 7.6  7.3  7.3   Total Protein 6.5 - 8.1 g/dL   5.5   Total Bilirubin 0.3 - 1.2 mg/dL   0.6   Alkaline Phos 38 - 126 U/L   54   AST 15 - 41 U/L   13   ALT 0 - 44 U/L   10       Latest Ref Rng & Units 04/10/2022    5:09 AM 04/09/2022    4:25 AM 04/08/2022    4:08 AM  CBC  WBC 4.0 - 10.5 K/uL 17.5  15.7  14.5   Hemoglobin 13.0 - 17.0 g/dL 10.8  9.8  10.9   Hematocrit 39.0 - 52.0 % 34.3  32.6  36.6   Platelets 150 - 400 K/uL 101  86  84     RADS: N/a  Assessment:   S/p robotic lap sigmoidectomy for unresectable tubulovillous adenoma.  Confirmed with patient again his goals of care.  He specifically stated "no" to surgery and antibiotics, any additional intervention to prolong life.    I explained DNR status and he also specifically said "ok" to DNR status. He  was as awake and aware of situation he has been for couple days now.   Notified daughter Micheal [name], and son Micheal [name]., changes in vital signs and withdrawal of care.  Notified them patient likely will not survive next couple days.  They verbalized understanding as well.  labs/images/medications/previous chart entries reviewed personally and relevant changes/updates noted above.

## 2022-04-10 NOTE — Progress Notes (Signed)
Attempted to give patient something to drink. Noted patient has a weak swallow and unable to recognise the need to clear his throat.  Gurgling heard after drinking while he is breathing/talking.   Paged MD.  Concern for aspiration risk.  Order for speech therapy placed and patient made NPO for now

## 2022-04-10 NOTE — Progress Notes (Signed)
PROGRESS NOTE    Micheal [name]  EXB:[id number] DOB: 1932/07/23 DOA: 04/02/2022 PCP: Howard Pouch, NP    Brief Narrative:  88 year old with history of COPD, CAD, CKD, DM 2, HTN, tubulovillous adenoma of colon tobacco use admitted on 11/13 for laparoscopic colon resection, which was done 11/16.  Medical team consulted for medical management especially with blood glucose.  Hospitalist complicated by hypotension on the evening of 11/17.  Treated with IV fluids with noted lactic acidosis, white blood cell count.  Hypotension persisting into 11/18 and mildly elevated procalcitonin and patient started on IV Zosyn 11/18.  By 11/19, hypotension better after patient on fluids plus midodrine.  White blood cell count trending downward.  Noted to have mild oxygen desaturation and BNP checked and found to be elevated at 945.  No previous history of heart failure, so fluids discontinued, started on gentle IV Lasix and echocardiogram done noting preserved ejection fraction and indeterminate diastolic function.  On 11/20, patient noted to be more somnolent with worsening renal function and creatinine of 2.23 and white blood cell count at 15.7.  CT scan of abdomen and pelvis ordered by general surgery notes worsening anastomotic leak.  (Also noted to have distended bladder and after surgery repositioned Foley catheter, large amount of urine presented.)  General surgery spoke with patient's children about concerns that patient would not survive further surgery and by morning of 11/21, patient made full comfort care.  Hospitalist became primary attending.  Assessment & Plan:   Active Problems:   DM (diabetes mellitus), type 2, uncontrolled, with hyperosmolarity ([place])   Acute renal failure superimposed on stage 3b chronic kidney disease (HCC)   Thrombocytopenia (HCC)   CAD (coronary artery disease)   COPD (chronic obstructive pulmonary disease) (HCC)   Erosive gastritis   HTN (hypertension)   Alcohol  abuse Colon mass, tubulovillous adenoma, course now complicated for anastomotic leak - Suspicion for malignancy.   Initially admitted under general surgery during this admission for laparoscopic resection.   Management per surgery Now with signs consistent with new leak.  Very high mortality risk for surgical correction.  Case discussed with patient's children and no plans for surgery.  He is now full comfort care.  Severe sepsis Meets criteria for severe sepsis given anastomotic leak as source with hypotension, leukocytosis and tachycardia.  Put on Zosyn although unfortunately, with leak persisting, he likely will not recover from this.  Acute diastolic heart failure unclassified: No previous history of heart failure.  BNP elevated at 945, following aggressive fluid resuscitation.  Have stopped IV fluids and started IV Lasix.  Echocardiogram notes indeterminate ejection fraction.  See below.  DM (diabetes mellitus), type 2, uncontrolled, with hyperosmolarity ([place]) Poorly controlled last A1c is 10.8.   Patient's home medication Lantus 16 units daily, glipizide 2.5 mg twice daily, Trijardy. Sugars labile.  Some episodes of hypoglycemia.  Now comfort care.  Acute renal failure superimposed on stage 3b chronic kidney disease ([place]) Worsening creatinine likely due to hypotension and infection as above.  Creatinine continue to worsen, likely multifactorial from bladder obstruction, decompensated heart failure and anastomotic leak.  Foley adjusted and large amount of urine expressed.    COPD (chronic obstructive pulmonary disease) (HCC) Mild abnormal breath sounds Breath sounds improved.   Air movement coarse but no obvious wheezing.   Patient on room air.  Continue prn bronchodilator regimen.   Thrombocytopenia ([place]) Appears to be chronic.   No active signs of bleeding.  Continue to monitor.  CAD (coronary artery disease) No chest pain.  Normally on Toprol, held secondary to  hypotension, discontinued after patient was made comfort care     Erosive gastritis PPI twice daily, discontinued after comfort care   HTN (hypertension) Initially on Toprol, held secondary to hypotension, now discontinued   Remote Hx of EtOH abuse, currently no signs of withdrawal.     DVT prophylaxis: Discontinued now that he is comfort care Code Status: Comfort care Family Communication: Left message for children Disposition Plan: Anticipate patient will pass away in the hospital Level of care: Transfer to [place]  Consultants:  Hospitalist became primary attending. General surgery  Procedures:  Status post laparoscopic colonic resection Echocardiogram with preserved ejection fraction, indeterminate diastolic function  Antimicrobials: Flagyl Erythromycin Zosyn 11/18-11/21   Subjective: Somnolent  Objective: Vitals:   04/10/22 0242 04/10/22 0342 04/10/22 0352 04/10/22 0815  BP: (!) 115/49 (!) 132/56  (!) 94/50  Pulse: (!) 101 (!) 101  99  Resp: (!) 30 (!) 29  (!) 30  Temp: 99.4 F (37.4 C) 98.5 F (36.9 C)  98.1 F (36.7 C)  TempSrc: Oral Oral  Oral  SpO2: 100% 99%  98%  Weight:   66.4 kg   Height:        Intake/Output Summary (Last 24 hours) at 04/10/2022 1527 Last data filed at 04/10/2022 1105 Gross per 24 hour  Intake 240 ml  Output 2375 ml  Net -2135 ml    Filed Weights   04/02/22 2038 04/05/22 1219 04/10/22 0352  Weight: 60.1 kg 60.1 kg 66.4 kg    Examination:  General exam: Somewhat somnolent. Respiratory system: Decreased breath sounds bibasilar Cardiovascular system: Regular rate and rhythm, S1-S2 Gastrointestinal system: Status post colonic resection Extremities: No clubbing or cyanosis, trace pitting edema Psychiatry: Drowsy    Data Reviewed: No further labs, patient comfort care LOS: 8 days    Annita Brod, MD [facility]   If 7PM-7AM, please contact night-coverage  04/10/2022, 3:27 PM

## 2022-04-10 NOTE — Progress Notes (Signed)
Nutrition Brief Note  Chart reviewed. Pt now transitioning to comfort care.  No further nutrition interventions planned at this time.  Please re-consult as needed.   Vergene Marland W, RD, LDN, CDCES Registered Dietitian II Certified Diabetes Care and Education Specialist Please refer to [facility] for RD and/or RD on-call/weekend/after hours pager   

## 2022-04-10 NOTE — Progress Notes (Signed)
   04/09/22 2052  Assess: MEWS Score  Temp 99.9 F (37.7 C)  BP (!) 122/48  MAP (mmHg) 70  Pulse Rate (!) 113  Resp 20  SpO2 95 %  O2 Device Nasal Cannula  O2 Flow Rate (L/min) 3 L/min  Assess: MEWS Score  MEWS Temp 0  MEWS Systolic 0  MEWS Pulse 2  MEWS RR 0  MEWS LOC 0  MEWS Score 2  MEWS Score Color Yellow  Assess: if the MEWS score is Yellow or Red  Were vital signs taken at a resting state? Yes  Focused Assessment Change from prior assessment (see assessment flowsheet)  Does the patient meet 2 or more of the SIRS criteria? Yes  Does the patient have a confirmed or suspected source of infection? No  MEWS guidelines implemented *See Row Information* Yes  Treat  MEWS Interventions Administered scheduled meds/treatments  Pain Score 0  Take Vital Signs  Increase Vital Sign Frequency  Yellow: Q 2hr X 2 then Q 4hr X 2, if remains yellow, continue Q 4hrs  Escalate  MEWS: Escalate Yellow: discuss with charge nurse/RN and consider discussing with provider and RRT  Notify: Charge Nurse/RN  Name of Charge Nurse/RN Notified Myrtis Hopping, RN  Date Charge Nurse/RN Notified 04/09/22  Time Charge Nurse/RN Notified 2100  Provider Notification  Provider Name/Title Neomia Glass NP  Date Provider Notified 04/10/22  Time Provider Notified 2358  Method of Notification Page  Notification Reason Change in status (from yellow to red mews now)  Provider response Other (Comment) (awaiting response)  Assess: SIRS CRITERIA  SIRS Temperature  0  SIRS Pulse 1  SIRS Respirations  0  SIRS WBC 1  SIRS Score Sum  2

## 2022-04-10 NOTE — Progress Notes (Signed)
   04/09/22 2342  Assess: MEWS Score  Temp 99.2 F (37.3 C)  BP 123/60  MAP (mmHg) 74  Pulse Rate (!) 120  Resp (!) 28  SpO2 97 %  O2 Device Nasal Cannula  O2 Flow Rate (L/min) 3 L/min  Assess: MEWS Score  MEWS Temp 0  MEWS Systolic 0  MEWS Pulse 2  MEWS RR 2  MEWS LOC 0  MEWS Score 4  MEWS Score Color Red  Assess: if the MEWS score is Yellow or Red  Were vital signs taken at a resting state? Yes  Focused Assessment Change from prior assessment (see assessment flowsheet)  Does the patient meet 2 or more of the SIRS criteria? Yes  Does the patient have a confirmed or suspected source of infection? No  MEWS guidelines implemented *See Row Information* No, previously red, continue vital signs every 4 hours  Treat  MEWS Interventions Administered prn meds/treatments;Other (Comment) (MD Piscoya paged)  Notify: Charge Nurse/RN  Name of Charge Nurse/RN Notified Myrtis Hopping  Date Charge Nurse/RN Notified 04/10/22  Time Charge Nurse/RN Notified 0000  Provider Notification  Provider Name/Title MD Piscoya  Date Provider Notified 04/10/22  Time Provider Notified 0030  Method of Notification Page  Notification Reason Change in status  Provider response See new orders  Date of Provider Response 04/10/22  Time of Provider Response 0036  Assess: SIRS CRITERIA  SIRS Temperature  0  SIRS Pulse 1  SIRS Respirations  1  SIRS WBC 1  SIRS Score Sum  3

## 2022-04-11 DIAGNOSIS — Z515 Encounter for palliative care: Secondary | ICD-10-CM | POA: Diagnosis not present

## 2022-04-11 MED ORDER — LORAZEPAM 1 MG PO TABS
1.0000 mg | ORAL_TABLET | ORAL | Status: DC | PRN
  Administered 2022-04-11: 1 mg via ORAL
  Filled 2022-04-11: qty 1

## 2022-04-11 MED ORDER — LORAZEPAM 2 MG/ML IJ SOLN
1.0000 mg | INTRAMUSCULAR | Status: DC | PRN
  Filled 2022-04-11: qty 1

## 2022-04-11 MED ORDER — LORAZEPAM 2 MG/ML PO CONC: 1.0000 mg | ORAL | Status: DC | PRN

## 2022-04-11 NOTE — Care Management Important Message (Signed)
Important Message  Patient Details  Name: Micheal [name] MRN: [id number] Date of Birth: 07/28/1932   Medicare Important Message Given:  Other (see comment)  On comfort care measures.  Medicare IM withheld at this time out of respect for patient and family.    Dannette Barbara 04/11/2022, 8:49 AM

## 2022-04-11 NOTE — Progress Notes (Signed)
PROGRESS NOTE    Micheal [name]  NAT:[id number] DOB: 1933/03/30 DOA: 04/02/2022 PCP: Howard Pouch, NP    Brief Narrative:  88 year old with history of COPD, CAD, CKD, DM 2, HTN, tubulovillous adenoma of colon tobacco use admitted on 11/13 for laparoscopic colon resection, which was done 11/16.  Medical team consulted for medical management especially with blood glucose.  Hospitalist complicated by hypotension on the evening of 11/17.  Treated with IV fluids with noted lactic acidosis, white blood cell count.  Hypotension persisting into 11/18 and mildly elevated procalcitonin and patient started on IV Zosyn 11/18.   By 11/19, hypotension better after patient on fluids plus midodrine.  White blood cell count trending downward.  Noted to have mild oxygen desaturation and BNP checked and found to be elevated at 945.  No previous history of heart failure, so fluids discontinued, started on gentle IV Lasix and echocardiogram done noting preserved ejection fraction and indeterminate diastolic function.   On 11/20, patient noted to be more somnolent with worsening renal function and creatinine of 2.23 and white blood cell count at 15.7.  CT scan of abdomen and pelvis ordered by general surgery notes worsening anastomotic leak.  (Also noted to have distended bladder and after surgery repositioned Foley catheter, large amount of urine presented.)  General surgery spoke with patient's children about concerns that patient would not survive further surgery and by morning of 11/21, patient made full comfort care.  Hospitalist became primary attending.   Assessment & Plan:   Active Problems:   DM (diabetes mellitus), type 2, uncontrolled, with hyperosmolarity ([place])   Acute renal failure superimposed on stage 3b chronic kidney disease (HCC)   Thrombocytopenia (HCC)   CAD (coronary artery disease)   COPD (chronic obstructive pulmonary disease) (HCC)   Erosive gastritis   HTN (hypertension)   Alcohol  abuse  Colon mass, tubulovillous adenoma, course now complicated for anastomotic leak - Suspicion for malignancy.   Initially admitted under general surgery during this admission for laparoscopic resection.   Management per surgery Now with signs consistent with new leak.  Very high mortality risk for surgical correction.  Case discussed with patient's children and no plans for surgery.  He is now full comfort care. All meds focused on patient comfort   Severe sepsis Meets criteria for severe sepsis given anastomotic leak as source with hypotension, leukocytosis and tachycardia.  Put on Zosyn although unfortunately, with leak persisting, he likely will not recover from this.  Abx stopped   Acute diastolic heart failure unclassified: No previous history of heart failure.  BNP elevated at 945, following aggressive fluid resuscitation.      DM (diabetes mellitus), type 2, uncontrolled, with hyperosmolarity (HCC) Stopped CBG.  Liberalize diet   Acute renal failure superimposed on stage 3b chronic kidney disease ([place]) Stop checking labs   COPD (chronic obstructive pulmonary disease) (HCC) Mild abnormal breath sounds Coarse breath sounds.  PRN lasix and robinul   Thrombocytopenia (HCC) Appears to be chronic.       CAD (coronary artery disease) No chest pain.  Normally on Toprol, held secondary to hypotension, discontinued after patient was made comfort care     Erosive gastritis PPI twice daily, discontinued after comfort care   HTN (hypertension) Initially on Toprol, held secondary to hypotension, now discontinued    DVT prophylaxis: Comfort Code Status: DNR Family Communication:Son via phone 11/22 Disposition Plan:  Level of care: Med-Surg  Consultants:  None  Procedures:  None  Antimicrobials: None  Subjective: Seen and examined. Appears frail.  Weak voice  Objective: Vitals:   04/10/22 0815 04/10/22 1645 04/10/22 1933 04/11/22 0941  BP: (!) 94/50 122/62  (!) 126/53 133/66  Pulse: 99 (!) 107 (!) 107 (!) 107  Resp: (!) 30 (!) 24 17   Temp: 98.1 F (36.7 C) 98.4 F (36.9 C) 97.9 F (36.6 C) 98.4 F (36.9 C)  TempSrc: Oral Oral  Oral  SpO2: 98% 95% 92% 97%  Weight:      Height:        Intake/Output Summary (Last 24 hours) at 04/11/2022 1523 Last data filed at 04/11/2022 1400 Gross per 24 hour  Intake 240 ml  Output 700 ml  Net -460 ml   Filed Weights   04/02/22 2038 04/05/22 1219 04/10/22 0352  Weight: 60.1 kg 60.1 kg 66.4 kg    Examination:  Limited exam due to comfort measures status  General exam: Ill appearing Respiratory system: Coarse bl Cardiovascular system: S1S2, RRR, no murmur    Data Reviewed: I have personally reviewed following labs and imaging studies  CBC: Recent Labs  Lab 04/06/22 1444 04/06/22 1939 04/07/22 0414 04/08/22 0408 04/09/22 0425 04/10/22 0509  WBC 20.5* 20.4* 16.7* 14.5* 15.7* 17.5*  NEUTROABS 17.1* 16.7*  --   --   --   --   HGB 10.4* 10.3* 9.8* 10.9* 9.8* 10.8*  HCT 33.7* 34.2* 32.6* 36.6* 32.6* 34.3*  MCV 100.6* 101.2* 102.2* 102.8* 99.7 97.4  PLT 115* 99* 96* 84* 86* 765*   Basic Metabolic Panel: Recent Labs  Lab 04/06/22 0415 04/06/22 1444 04/06/22 1939 04/07/22 0414 04/08/22 0408 04/09/22 0425 04/10/22 0509  NA 144   < > 139 140 136 141 142  K 4.9   < > 4.8 4.8 4.8 4.4 4.3  CL 110   < > 108 110 110 110 107  CO2 29   < > '24 25 23 27 24  '$ GLUCOSE 133*   < > 203* 151* 106* 92 107*  BUN 27*   < > 31* 34* 32* 36* 38*  CREATININE 1.42*   < > 1.70* 1.77* 1.84* 2.23* 2.08*  CALCIUM 7.9*   < > 7.4* 7.2* 7.3* 7.3* 7.6*  MG 2.2  --   --  2.0 1.9 1.8 1.9   < > = values in this interval not displayed.   GFR: Estimated Creatinine Clearance: 23.4 mL/min (A) (by C-G formula based on SCr of 2.08 mg/dL (H)). Liver Function Tests: Recent Labs  Lab 04/08/22 0408  AST 13*  ALT 10  ALKPHOS 54  BILITOT 0.6  PROT 5.5*  ALBUMIN 2.5*   No results for input(s): "LIPASE",  "AMYLASE" in the last 168 hours. No results for input(s): "AMMONIA" in the last 168 hours. Coagulation Profile: No results for input(s): "INR", "PROTIME" in the last 168 hours. Cardiac Enzymes: No results for input(s): "CKTOTAL", "CKMB", "CKMBINDEX", "TROPONINI" in the last 168 hours. BNP (last 3 results) No results for input(s): "PROBNP" in the last 8760 hours. HbA1C: No results for input(s): "HGBA1C" in the last 72 hours. CBG: Recent Labs  Lab 04/09/22 0805 04/09/22 0907 04/09/22 1332 04/09/22 1546 04/09/22 2202  GLUCAP 87 88 91 82 91   Lipid Profile: No results for input(s): "CHOL", "HDL", "LDLCALC", "TRIG", "CHOLHDL", "LDLDIRECT" in the last 72 hours. Thyroid Function Tests: No results for input(s): "TSH", "T4TOTAL", "FREET4", "T3FREE", "THYROIDAB" in the last 72 hours. Anemia Panel: No results for input(s): "VITAMINB12", "FOLATE", "FERRITIN", "TIBC", "IRON", "RETICCTPCT" in the last 72 hours. Sepsis  Labs: Recent Labs  Lab 04/06/22 1719 04/06/22 1939 04/07/22 0414 04/07/22 0851 04/08/22 0408 04/10/22 0509  PROCALCITON  --   --  0.27  --  0.21 7.98  LATICACIDVEN 2.5* 2.0*  --  1.4  --  1.5    Recent Results (from the past 240 hour(s))  Surgical PCR screen     Status: None   Collection Time: 04/04/22 10:42 PM   Specimen: Nasal Mucosa; Nasal Swab  Result Value Ref Range Status   MRSA, PCR NEGATIVE NEGATIVE Final   Staphylococcus aureus NEGATIVE NEGATIVE Final    Comment: (NOTE) The Xpert SA Assay (FDA approved for NASAL specimens in patients 4 years of age and older), is one component of a comprehensive surveillance program. It is not intended to diagnose infection nor to guide or monitor treatment. Performed at [facility], [address] [place], [place] [address]   Culture, blood (Routine X 2) w Reflex to ID Panel     Status: None (Preliminary result)   Collection Time: 04/07/22  6:30 PM   Specimen: BLOOD  Result Value Ref Range Status    Specimen Description BLOOD BLOOD LEFT HAND  Final   Special Requests   Final    BOTTLES DRAWN AEROBIC AND ANAEROBIC Blood Culture adequate volume   Culture   Final    NO GROWTH 4 DAYS Performed at [facility], [address] [place], [place] [address]    Report Status PENDING  Incomplete  Culture, blood (Routine X 2) w Reflex to ID Panel     Status: None (Preliminary result)   Collection Time: 04/07/22  6:36 PM   Specimen: BLOOD  Result Value Ref Range Status   Specimen Description BLOOD BLOOD RIGHT HAND  Final   Special Requests   Final    BOTTLES DRAWN AEROBIC AND ANAEROBIC Blood Culture adequate volume   Culture   Final    NO GROWTH 4 DAYS Performed at [facility], [address], [place], [place] [address]    Report Status PENDING  Incomplete         Radiology Studies: No results found.      Scheduled Meds:  furosemide  20 mg Intravenous BID   nicotine  21 mg Transdermal Daily   Continuous Infusions:  sodium chloride Stopped (04/09/22 0517)     LOS: 9 days      Sidney Ace, MD [facility]   If 7PM-7AM, please contact night-coverage  04/11/2022, 3:23 PM

## 2022-04-11 NOTE — Progress Notes (Addendum)
71 - received report 2007 - pt denies needs 2232 - pt resting in bed with eyes closed, respirations even and unlabored, no signs of distress or discomfort noted 2350 - pt asking for cola 0020 - pt resting in bed with eyes closed, respirations even and unlabored, no signs of distress noted 0225 - pt resting in bed with eyes closed, respirations even and unlabored, no signs of distress noted 0558 - pt resting in bed reporting "my throat is dry". Fresh ice water provided per pt's request. Pt sipped water.

## 2022-04-11 NOTE — TOC Progression Note (Addendum)
Transition of Care [facility]) - Progression Note    Patient Details  Name: Micheal [name] MRN: [id number] Date of Birth: 1933-02-03  Transition of Care [facility] CM/SW [place], [place] Phone Number: 04/11/2022, 10:53 AM  Clinical Narrative:     Update 11:12 am: CSW and RN met with patient at bedside as he is awake and alert now, he reports that his first point of contact and decision maker is Deanine and that second contact is Magazine features editor. MD updated. Chart updated.     CSW met with patient in room, patient too ill to communicate in meaningful discussion of needs/wishes at this time.  CSW has reached out to son Garry Nicolini at [phone] who reports he is main point of contact and decision maker. MD to call son to discuss care plan.   TOC will follow for needs.   Kelby Fam, [place], MSW, [place]       Expected Discharge Plan and Services                                                 Social Determinants of Health (SDOH) Interventions    Readmission Risk Interventions     No data to display

## 2022-04-12 DIAGNOSIS — Z515 Encounter for palliative care: Secondary | ICD-10-CM | POA: Diagnosis not present

## 2022-04-12 LAB — CULTURE, BLOOD (ROUTINE X 2)
Culture: NO GROWTH
Culture: NO GROWTH
Special Requests: ADEQUATE
Special Requests: ADEQUATE

## 2022-04-12 MED ORDER — MORPHINE SULFATE (CONCENTRATE) 10 MG/0.5ML PO SOLN
5.0000 mg | ORAL | Status: DC | PRN
  Administered 2022-04-13 (×3): 5 mg via ORAL
  Filled 2022-04-12 (×3): qty 0.5

## 2022-04-12 MED ORDER — MORPHINE SULFATE (CONCENTRATE) 10 MG/0.5ML PO SOLN
5.0000 mg | ORAL | Status: DC | PRN
  Administered 2022-04-12 (×3): 5 mg via SUBLINGUAL
  Filled 2022-04-12 (×3): qty 0.5

## 2022-04-12 NOTE — Progress Notes (Signed)
Patient transferred to room 118. Son, Reiley Keisler, was notified and given and update and informed that the patient was transferred

## 2022-04-12 NOTE — Progress Notes (Signed)
PROGRESS NOTE    Micheal [name]  GOT:[id number] DOB: 08/31/1932 DOA: 04/02/2022 PCP: Howard Pouch, NP    Brief Narrative:  88 year old with history of COPD, CAD, CKD, DM 2, HTN, tubulovillous adenoma of colon tobacco use admitted on 11/13 for laparoscopic colon resection, which was done 11/16.  Medical team consulted for medical management especially with blood glucose.  Hospitalist complicated by hypotension on the evening of 11/17.  Treated with IV fluids with noted lactic acidosis, white blood cell count.  Hypotension persisting into 11/18 and mildly elevated procalcitonin and patient started on IV Zosyn 11/18.   By 11/19, hypotension better after patient on fluids plus midodrine.  White blood cell count trending downward.  Noted to have mild oxygen desaturation and BNP checked and found to be elevated at 945.  No previous history of heart failure, so fluids discontinued, started on gentle IV Lasix and echocardiogram done noting preserved ejection fraction and indeterminate diastolic function.   On 11/20, patient noted to be more somnolent with worsening renal function and creatinine of 2.23 and white blood cell count at 15.7.  CT scan of abdomen and pelvis ordered by general surgery notes worsening anastomotic leak.  (Also noted to have distended bladder and after surgery repositioned Foley catheter, large amount of urine presented.)  General surgery spoke with patient's children about concerns that patient would not survive further surgery and by morning of 11/21, patient made full comfort care.  Hospitalist became primary attending.   Assessment & Plan:   Active Problems:   DM (diabetes mellitus), type 2, uncontrolled, with hyperosmolarity ([place])   Acute renal failure superimposed on stage 3b chronic kidney disease (HCC)   Thrombocytopenia (HCC)   CAD (coronary artery disease)   COPD (chronic obstructive pulmonary disease) (HCC)   Erosive gastritis   HTN (hypertension)   Alcohol  abuse  Colon mass, tubulovillous adenoma, course now complicated for anastomotic leak - Suspicion for malignancy.   Initially admitted under general surgery during this admission for laparoscopic resection.   Management per surgery Now with signs consistent with new leak.  Very high mortality risk for surgical correction.  Case discussed with patient's children and no plans for surgery.  He is now full comfort care. All meds focused on patient comfort.  PO morphine added 11/23   Severe sepsis Meets criteria for severe sepsis given anastomotic leak as source with hypotension, leukocytosis and tachycardia.  Put on Zosyn although unfortunately, with leak persisting, he likely will not recover from this.  Abx discontinued   Acute diastolic heart failure unclassified: No previous history of heart failure.  BNP elevated at 945, following aggressive fluid resuscitation.      DM (diabetes mellitus), type 2, uncontrolled, with hyperosmolarity (HCC) Stopped CBG.  Liberalize diet   Acute renal failure superimposed on stage 3b chronic kidney disease ([place]) Stop checking labs   COPD (chronic obstructive pulmonary disease) (HCC) Mild abnormal breath sounds Coarse breath sounds.  PRN lasix and robinul   Thrombocytopenia (HCC) Appears to be chronic.       CAD (coronary artery disease) No chest pain.  Normally on Toprol, held secondary to hypotension, discontinued after patient was made comfort care     Erosive gastritis PPI twice daily, discontinued after comfort care   HTN (hypertension) Initially on Toprol, held secondary to hypotension, now discontinued    DVT prophylaxis: Comfort Code Status: DNR Family Communication:Son via phone 11/22 Disposition Plan: Status is: Inpatient Remains inpatient appropriate because: Comfort measures.  Anticipate in hospital death  Level of care: Med-Surg  Consultants:  None  Procedures:  None  Antimicrobials: None    Subjective: Seen and  examined. Very sleepy this AM.  In no visible distress  Objective: Vitals:   04/11/22 0941 04/11/22 1751 04/11/22 1943 04/12/22 0850  BP: 133/66 (!) 115/48 (!) 140/71 137/64  Pulse: (!) 107 (!) 110 (!) 116 (!) 116  Resp:  17 (!) 28 20  Temp: 98.4 F (36.9 C) 98.4 F (36.9 C) 98.6 F (37 C) 99.5 F (37.5 C)  TempSrc: Oral Oral Oral   SpO2: 97% 98% 95% 97%  Weight:      Height:        Intake/Output Summary (Last 24 hours) at 04/12/2022 1035 Last data filed at 04/12/2022 0645 Gross per 24 hour  Intake 240 ml  Output 3118 ml  Net -2878 ml   Filed Weights   04/02/22 2038 04/05/22 1219 04/10/22 0352  Weight: 60.1 kg 60.1 kg 66.4 kg    Examination:  Limited exam due to comfort measures status  General exam: Ill appearing Respiratory system: Coarse breath sounds bl, normal WOB Cardiovascular system: S1S2, RRR, no murmur    Data Reviewed: I have personally reviewed following labs and imaging studies  CBC: Recent Labs  Lab 04/06/22 1444 04/06/22 1939 04/07/22 0414 04/08/22 0408 04/09/22 0425 04/10/22 0509  WBC 20.5* 20.4* 16.7* 14.5* 15.7* 17.5*  NEUTROABS 17.1* 16.7*  --   --   --   --   HGB 10.4* 10.3* 9.8* 10.9* 9.8* 10.8*  HCT 33.7* 34.2* 32.6* 36.6* 32.6* 34.3*  MCV 100.6* 101.2* 102.2* 102.8* 99.7 97.4  PLT 115* 99* 96* 84* 86* 268*   Basic Metabolic Panel: Recent Labs  Lab 04/06/22 0415 04/06/22 1444 04/06/22 1939 04/07/22 0414 04/08/22 0408 04/09/22 0425 04/10/22 0509  NA 144   < > 139 140 136 141 142  K 4.9   < > 4.8 4.8 4.8 4.4 4.3  CL 110   < > 108 110 110 110 107  CO2 29   < > '24 25 23 27 24  '$ GLUCOSE 133*   < > 203* 151* 106* 92 107*  BUN 27*   < > 31* 34* 32* 36* 38*  CREATININE 1.42*   < > 1.70* 1.77* 1.84* 2.23* 2.08*  CALCIUM 7.9*   < > 7.4* 7.2* 7.3* 7.3* 7.6*  MG 2.2  --   --  2.0 1.9 1.8 1.9   < > = values in this interval not displayed.   GFR: Estimated Creatinine Clearance: 23.4 mL/min (A) (by C-G formula based on SCr of 2.08  mg/dL (H)). Liver Function Tests: Recent Labs  Lab 04/08/22 0408  AST 13*  ALT 10  ALKPHOS 54  BILITOT 0.6  PROT 5.5*  ALBUMIN 2.5*   No results for input(s): "LIPASE", "AMYLASE" in the last 168 hours. No results for input(s): "AMMONIA" in the last 168 hours. Coagulation Profile: No results for input(s): "INR", "PROTIME" in the last 168 hours. Cardiac Enzymes: No results for input(s): "CKTOTAL", "CKMB", "CKMBINDEX", "TROPONINI" in the last 168 hours. BNP (last 3 results) No results for input(s): "PROBNP" in the last 8760 hours. HbA1C: No results for input(s): "HGBA1C" in the last 72 hours. CBG: Recent Labs  Lab 04/09/22 0805 04/09/22 0907 04/09/22 1332 04/09/22 1546 04/09/22 2202  GLUCAP 87 88 91 82 91   Lipid Profile: No results for input(s): "CHOL", "HDL", "LDLCALC", "TRIG", "CHOLHDL", "LDLDIRECT" in the last 72 hours. Thyroid Function Tests: No results for input(s): "TSH", "T4TOTAL", "  FREET4", "T3FREE", "THYROIDAB" in the last 72 hours. Anemia Panel: No results for input(s): "VITAMINB12", "FOLATE", "FERRITIN", "TIBC", "IRON", "RETICCTPCT" in the last 72 hours. Sepsis Labs: Recent Labs  Lab 04/06/22 1719 04/06/22 1939 04/07/22 0414 04/07/22 0851 04/08/22 0408 04/10/22 0509  PROCALCITON  --   --  0.27  --  0.21 7.98  LATICACIDVEN 2.5* 2.0*  --  1.4  --  1.5    Recent Results (from the past 240 hour(s))  Surgical PCR screen     Status: None   Collection Time: 04/04/22 10:42 PM   Specimen: Nasal Mucosa; Nasal Swab  Result Value Ref Range Status   MRSA, PCR NEGATIVE NEGATIVE Final   Staphylococcus aureus NEGATIVE NEGATIVE Final    Comment: (NOTE) The Xpert SA Assay (FDA approved for NASAL specimens in patients 38 years of age and older), is one component of a comprehensive surveillance program. It is not intended to diagnose infection nor to guide or monitor treatment. Performed at [facility], [address] [place], [place] [address]    Culture, blood (Routine X 2) w Reflex to ID Panel     Status: None   Collection Time: 04/07/22  6:30 PM   Specimen: BLOOD  Result Value Ref Range Status   Specimen Description BLOOD BLOOD LEFT HAND  Final   Special Requests   Final    BOTTLES DRAWN AEROBIC AND ANAEROBIC Blood Culture adequate volume   Culture   Final    NO GROWTH 5 DAYS Performed at [facility], [address] [place], [place] [address]    Report Status 04/12/2022 FINAL  Final  Culture, blood (Routine X 2) w Reflex to ID Panel     Status: None   Collection Time: 04/07/22  6:36 PM   Specimen: BLOOD  Result Value Ref Range Status   Specimen Description BLOOD BLOOD RIGHT HAND  Final   Special Requests   Final    BOTTLES DRAWN AEROBIC AND ANAEROBIC Blood Culture adequate volume   Culture   Final    NO GROWTH 5 DAYS Performed at [facility], [address], [place], [place] [address]    Report Status 04/12/2022 FINAL  Final         Radiology Studies: No results found.      Scheduled Meds:  nicotine  21 mg Transdermal Daily   Continuous Infusions:  sodium chloride Stopped (04/09/22 0517)     LOS: 10 days      Sidney Ace, MD [facility]   If 7PM-7AM, please contact night-coverage  04/12/2022, 10:35 AM

## 2022-04-13 DIAGNOSIS — Z515 Encounter for palliative care: Secondary | ICD-10-CM | POA: Diagnosis not present

## 2022-04-13 MED ORDER — ARTIFICIAL TEARS OPHTHALMIC OINT: 1.0000 | TOPICAL_OINTMENT | OPHTHALMIC | Status: AC | PRN

## 2022-04-13 MED ORDER — MORPHINE SULFATE (CONCENTRATE) 10 MG/0.5ML PO SOLN: 5.0000 mg | ORAL | 0 refills | Status: AC | PRN

## 2022-04-13 MED ORDER — LORAZEPAM 1 MG PO TABS: 1.0000 mg | ORAL_TABLET | ORAL | 0 refills | Status: AC | PRN

## 2022-04-13 NOTE — Progress Notes (Signed)
PROGRESS NOTE    OTHER ATIENZA  PZW:[id number] DOB: [birth year] DOA: 04/02/2022 PCP: Howard Pouch, NP    Brief Narrative:  88 year old with history of COPD, CAD, CKD, DM 2, HTN, tubulovillous adenoma of colon tobacco use admitted on 11/13 for laparoscopic colon resection, which was done 11/16.  Medical team consulted for medical management especially with blood glucose.  Hospitalist complicated by hypotension on the evening of 11/17.  Treated with IV fluids with noted lactic acidosis, white blood cell count.  Hypotension persisting into 11/18 and mildly elevated procalcitonin and patient started on IV Zosyn 11/18.   By 11/19, hypotension better after patient on fluids plus midodrine.  White blood cell count trending downward.  Noted to have mild oxygen desaturation and BNP checked and found to be elevated at 945.  No previous history of heart failure, so fluids discontinued, started on gentle IV Lasix and echocardiogram done noting preserved ejection fraction and indeterminate diastolic function.   On 11/20, patient noted to be more somnolent with worsening renal function and creatinine of 2.23 and white blood cell count at 15.7.  CT scan of abdomen and pelvis ordered by general surgery notes worsening anastomotic leak.  (Also noted to have distended bladder and after surgery repositioned Foley catheter, large amount of urine presented.)  General surgery spoke with patient's children about concerns that patient would not survive further surgery and by morning of 11/21, patient made full comfort care.  Hospitalist became primary attending.   Assessment & Plan:   Active Problems:   DM (diabetes mellitus), type 2, uncontrolled, with hyperosmolarity ([place])   Acute renal failure superimposed on stage 3b chronic kidney disease (HCC)   Thrombocytopenia (HCC)   CAD (coronary artery disease)   COPD (chronic obstructive pulmonary disease) (HCC)   Erosive gastritis   HTN (hypertension)   Alcohol  abuse  Colon mass, tubulovillous adenoma, course now complicated for anastomotic leak - Suspicion for malignancy.   Initially admitted under general surgery during this admission for laparoscopic resection.   Management per surgery Now with signs consistent with new leak.  Very high mortality risk for surgical correction.  Case discussed with patient's children and no plans for surgery.  He is now full comfort care. All meds focused on patient comfort.  PO morphine added 11/23.  Hospice liaison will be engaged on 11/24   Severe sepsis Meets criteria for severe sepsis given anastomotic leak as source with hypotension, leukocytosis and tachycardia.  Put on Zosyn although unfortunately, with leak persisting, he likely will not recover from this.  Abx discontinued   Acute diastolic heart failure unclassified: No previous history of heart failure.  BNP elevated at 945, following aggressive fluid resuscitation.      DM (diabetes mellitus), type 2, uncontrolled, with hyperosmolarity (HCC) Stopped CBG.  Liberalize diet   Acute renal failure superimposed on stage 3b chronic kidney disease ([place]) Stop checking labs   COPD (chronic obstructive pulmonary disease) (HCC) Mild abnormal breath sounds Coarse breath sounds.  PRN lasix and robinul   Thrombocytopenia (HCC) Appears to be chronic.       CAD (coronary artery disease) No chest pain.  Normally on Toprol, held secondary to hypotension, discontinued after patient was made comfort care     Erosive gastritis PPI twice daily, discontinued after comfort care   HTN (hypertension) Initially on Toprol, held secondary to hypotension, now discontinued    DVT prophylaxis: Comfort Code Status: DNR Family Communication:Son via phone 11/22, 11/24 Disposition Plan: Status is: Inpatient Remains inpatient  appropriate because: Comfort measures.  Hospice liaison engaged today   Level of care: Med-Surg  Consultants:  None  Procedures:   None  Antimicrobials: None    Subjective: Seen and examined. Endorses thirst  Objective: Vitals:   04/11/22 1751 04/11/22 1943 04/12/22 0850 04/12/22 2013  BP: (!) 115/48 (!) 140/71 137/64 120/61  Pulse: (!) 110 (!) 116 (!) 116 93  Resp: 17 (!) '28 20 18  '$ Temp: 98.4 F (36.9 C) 98.6 F (37 C) 99.5 F (37.5 C) 98.2 F (36.8 C)  TempSrc: Oral Oral  Oral  SpO2: 98% 95% 97% (!) 87%  Weight:      Height:        Intake/Output Summary (Last 24 hours) at 04/13/2022 1044 Last data filed at 04/13/2022 3154 Gross per 24 hour  Intake --  Output 900 ml  Net -900 ml   Filed Weights   04/02/22 2038 04/05/22 1219 04/10/22 0352  Weight: 60.1 kg 60.1 kg 66.4 kg    Examination:  Limited exam due to comfort measures status  General exam: Ill appearing Respiratory system: Coarse breath sounds bl, normal WOB Cardiovascular system: S1S2, RRR, no murmur    Data Reviewed: I have personally reviewed following labs and imaging studies  CBC: Recent Labs  Lab 04/06/22 1444 04/06/22 1939 04/07/22 0414 04/08/22 0408 04/09/22 0425 04/10/22 0509  WBC 20.5* 20.4* 16.7* 14.5* 15.7* 17.5*  NEUTROABS 17.1* 16.7*  --   --   --   --   HGB 10.4* 10.3* 9.8* 10.9* 9.8* 10.8*  HCT 33.7* 34.2* 32.6* 36.6* 32.6* 34.3*  MCV 100.6* 101.2* 102.2* 102.8* 99.7 97.4  PLT 115* 99* 96* 84* 86* 008*   Basic Metabolic Panel: Recent Labs  Lab 04/06/22 1939 04/07/22 0414 04/08/22 0408 04/09/22 0425 04/10/22 0509  NA 139 140 136 141 142  K 4.8 4.8 4.8 4.4 4.3  CL 108 110 110 110 107  CO2 '24 25 23 27 24  '$ GLUCOSE 203* 151* 106* 92 107*  BUN 31* 34* 32* 36* 38*  CREATININE 1.70* 1.77* 1.84* 2.23* 2.08*  CALCIUM 7.4* 7.2* 7.3* 7.3* 7.6*  MG  --  2.0 1.9 1.8 1.9   GFR: Estimated Creatinine Clearance: 23.4 mL/min (A) (by C-G formula based on SCr of 2.08 mg/dL (H)). Liver Function Tests: Recent Labs  Lab 04/08/22 0408  AST 13*  ALT 10  ALKPHOS 54  BILITOT 0.6  PROT 5.5*  ALBUMIN 2.5*    No results for input(s): "LIPASE", "AMYLASE" in the last 168 hours. No results for input(s): "AMMONIA" in the last 168 hours. Coagulation Profile: No results for input(s): "INR", "PROTIME" in the last 168 hours. Cardiac Enzymes: No results for input(s): "CKTOTAL", "CKMB", "CKMBINDEX", "TROPONINI" in the last 168 hours. BNP (last 3 results) No results for input(s): "PROBNP" in the last 8760 hours. HbA1C: No results for input(s): "HGBA1C" in the last 72 hours. CBG: Recent Labs  Lab 04/09/22 0805 04/09/22 0907 04/09/22 1332 04/09/22 1546 04/09/22 2202  GLUCAP 87 88 91 82 91   Lipid Profile: No results for input(s): "CHOL", "HDL", "LDLCALC", "TRIG", "CHOLHDL", "LDLDIRECT" in the last 72 hours. Thyroid Function Tests: No results for input(s): "TSH", "T4TOTAL", "FREET4", "T3FREE", "THYROIDAB" in the last 72 hours. Anemia Panel: No results for input(s): "VITAMINB12", "FOLATE", "FERRITIN", "TIBC", "IRON", "RETICCTPCT" in the last 72 hours. Sepsis Labs: Recent Labs  Lab 04/06/22 1719 04/06/22 1939 04/07/22 0414 04/07/22 0851 04/08/22 0408 04/10/22 0509  PROCALCITON  --   --  0.27  --  0.21  7.98  LATICACIDVEN 2.5* 2.0*  --  1.4  --  1.5    Recent Results (from the past 240 hour(s))  Surgical PCR screen     Status: None   Collection Time: 04/04/22 10:42 PM   Specimen: Nasal Mucosa; Nasal Swab  Result Value Ref Range Status   MRSA, PCR NEGATIVE NEGATIVE Final   Staphylococcus aureus NEGATIVE NEGATIVE Final    Comment: (NOTE) The Xpert SA Assay (FDA approved for NASAL specimens in patients 72 years of age and older), is one component of a comprehensive surveillance program. It is not intended to diagnose infection nor to guide or monitor treatment. Performed at [facility], [address] [place], [place] [address]   Culture, blood (Routine X 2) w Reflex to ID Panel     Status: None   Collection Time: 04/07/22  6:30 PM   Specimen: BLOOD  Result Value Ref  Range Status   Specimen Description BLOOD BLOOD LEFT HAND  Final   Special Requests   Final    BOTTLES DRAWN AEROBIC AND ANAEROBIC Blood Culture adequate volume   Culture   Final    NO GROWTH 5 DAYS Performed at [facility], [address], [place], [place] [address]    Report Status 04/12/2022 FINAL  Final  Culture, blood (Routine X 2) w Reflex to ID Panel     Status: None   Collection Time: 04/07/22  6:36 PM   Specimen: BLOOD  Result Value Ref Range Status   Specimen Description BLOOD BLOOD RIGHT HAND  Final   Special Requests   Final    BOTTLES DRAWN AEROBIC AND ANAEROBIC Blood Culture adequate volume   Culture   Final    NO GROWTH 5 DAYS Performed at [facility], [address] [place], [place] [address]    Report Status 04/12/2022 FINAL  Final         Radiology Studies: No results found.      Scheduled Meds:  nicotine  21 mg Transdermal Daily   Continuous Infusions:  sodium chloride Stopped (04/09/22 0517)     LOS: 11 days      Sidney Ace, MD [facility]   If 7PM-7AM, please contact night-coverage  04/13/2022, 10:44 AM

## 2022-04-13 NOTE — Discharge Summary (Signed)
Physician Discharge Summary  Micheal [name] OEV:[id number] DOB: August 10, 1932 DOA: 04/02/2022  PCP: Howard Pouch, NP  Admit date: 04/02/2022 Discharge date: 04/13/2022  Admitted From: Home Disposition:  Inpatient hospice facility  Recommendations for Outpatient Follow-up:  NA   Home Health:NA Equipment/Devices:NA   Discharge Condition:Hospice/comfort  CODE STATUS:DNR  Diet recommendation: Comfort feeds  Brief/Interim Summary: 88 year old with history of COPD, CAD, CKD, DM 2, HTN, tubulovillous adenoma of colon tobacco use admitted on 11/13 for laparoscopic colon resection, which was done 11/16.  Medical team consulted for medical management especially with blood glucose.  Hospitalist complicated by hypotension on the evening of 11/17.  Treated with IV fluids with noted lactic acidosis, white blood cell count.  Hypotension persisting into 11/18 and mildly elevated procalcitonin and patient started on IV Zosyn 11/18.   By 11/19, hypotension better after patient on fluids plus midodrine.  White blood cell count trending downward.  Noted to have mild oxygen desaturation and BNP checked and found to be elevated at 945.  No previous history of heart failure, so fluids discontinued, started on gentle IV Lasix and echocardiogram done noting preserved ejection fraction and indeterminate diastolic function.   On 11/20, patient noted to be more somnolent with worsening renal function and creatinine of 2.23 and white blood cell count at 15.7.  CT scan of abdomen and pelvis ordered by general surgery notes worsening anastomotic leak.  (Also noted to have distended bladder and after surgery repositioned Foley catheter, large amount of urine presented.)  General surgery spoke with patient's children about concerns that patient would not survive further surgery and by morning of 11/21, patient made full comfort care.  Hospitalist became primary attending.    Discharge Diagnoses:  Active Problems:    DM (diabetes mellitus), type 2, uncontrolled, with hyperosmolarity ([place])   Acute renal failure superimposed on stage 3b chronic kidney disease (HCC)   Thrombocytopenia (HCC)   CAD (coronary artery disease)   COPD (chronic obstructive pulmonary disease) (HCC)   Erosive gastritis   HTN (hypertension)   Alcohol abuse  Colon mass, tubulovillous adenoma, course now complicated for anastomotic leak - Suspicion for malignancy.   Initially admitted under general surgery during this admission for laparoscopic resection.   Management per surgery Now with signs consistent with new leak.  Very high mortality risk for surgical correction.  Case discussed with patient's children and no plans for surgery.  He is now full comfort care. All meds focused on patient comfort.  PO morphine added 11/23.  Hospice liaison will be engaged on 11/24   Severe sepsis Meets criteria for severe sepsis given anastomotic leak as source with hypotension, leukocytosis and tachycardia.  Put on Zosyn although unfortunately, with leak persisting, he likely will not recover from this.  Abx discontinued   Acute diastolic heart failure unclassified: No previous history of heart failure.  BNP elevated at 945, following aggressive fluid resuscitation.      DM (diabetes mellitus), type 2, uncontrolled, with hyperosmolarity (HCC) Stopped CBG.  Liberalize diet   Acute renal failure superimposed on stage 3b chronic kidney disease ([place]) Stop checking labs   COPD (chronic obstructive pulmonary disease) (HCC) Mild abnormal breath sounds Coarse breath sounds.  PRN lasix and robinul   Thrombocytopenia (HCC) Appears to be chronic.       CAD (coronary artery disease) No chest pain.  Normally on Toprol, held secondary to hypotension, discontinued after patient was made comfort care     Erosive gastritis PPI twice daily, discontinued after comfort care  HTN (hypertension) Initially on Toprol, held secondary to hypotension, now  discontinued  Discharge Instructions  Discharge Instructions     Diet - low sodium heart healthy   Complete by: As directed    Increase activity slowly   Complete by: As directed    No wound care   Complete by: As directed       Allergies as of 04/13/2022   No Known Allergies      Medication List     STOP taking these medications    albuterol (2.5 MG/3ML) 0.083% nebulizer solution Commonly known as: PROVENTIL   albuterol 108 (90 Base) MCG/ACT inhaler Commonly known as: VENTOLIN HFA   aspirin EC 81 MG tablet   atorvastatin 40 MG tablet Commonly known as: LIPITOR   blood glucose meter kit and supplies   folic acid 1 MG tablet Commonly known as: FOLVITE   glipiZIDE 5 MG tablet Commonly known as: Glucotrol   Insulin Pen Needle 32G X 4 MM Misc   ipratropium-albuterol 0.5-2.5 (3) MG/3ML Soln Commonly known as: DUONEB   Lantus SoloStar 100 UNIT/ML Solostar Pen Generic drug: insulin glargine   lipase/protease/amylase 36000 UNITS Cpep capsule Commonly known as: Creon   losartan 50 MG tablet Commonly known as: COZAAR   metoprolol succinate 50 MG 24 hr tablet Commonly known as: TOPROL-XL   multivitamin with minerals Tabs tablet   nicotine 21 mg/24hr patch Commonly known as: NICODERM CQ - dosed in mg/24 hours   pantoprazole 40 MG tablet Commonly known as: PROTONIX   thiamine 100 MG tablet Commonly known as: VITAMIN B1   triamcinolone ointment 0.1 % Commonly known as: KENALOG   Trijardy XR 25-09-998 MG Tb24 Generic drug: Empagliflozin-Linaglip-Metform   vitamin D3 25 MCG tablet Commonly known as: CHOLECALCIFEROL       TAKE these medications    artificial tears Oint ophthalmic ointment Commonly known as: LACRILUBE Place 1 Application into both eyes every 4 (four) hours as needed for dry eyes.   LORazepam 1 MG tablet Commonly known as: ATIVAN Take 1 tablet (1 mg total) by mouth every 4 (four) hours as needed for anxiety.   morphine  CONCENTRATE 10 MG/0.5ML Soln concentrated solution Take 0.25 mLs (5 mg total) by mouth every 2 (two) hours as needed for moderate pain (or dyspnea).        No Known Allergies  Consultations: Surgery   Procedures/Studies: ECHOCARDIOGRAM COMPLETE  Result Date: 04/09/2022    ECHOCARDIOGRAM REPORT   Patient Name:   BINYOMIN BRANN Date of Exam: 04/09/2022 Medical Rec #:  [id number]       Height:       67.0 in Accession #:    [id number]      Weight:       132.4 lb Date of Birth:  09/28/1932       BSA:          1.697 m Patient Age:    63 years        BP:           96/54 mmHg Patient Gender: M               HR:           100 bpm. Exam Location:  [facility] Procedure: 2D Echo, Cardiac Doppler and Color Doppler Indications:     CHF-acute diastolic B93.90  History:         Patient has no prior history of Echocardiogram examinations.  COPD; Risk Factors:Diabetes and Hypertension.  Sonographer:     Sherrie Sport Referring Phys:  [place] Diagnosing Phys: Kathlyn Sacramento MD  Sonographer Comments: Suboptimal apical window and suboptimal subcostal window. Image acquisition challenging due to COPD. IMPRESSIONS  1. Left ventricular ejection fraction, by estimation, is 55 to 60%. The left ventricle has normal function. The left ventricle has no regional wall motion abnormalities. There is mild left ventricular hypertrophy. Left ventricular diastolic parameters are indeterminate.  2. Right ventricular systolic function is normal. The right ventricular size is normal. Tricuspid regurgitation signal is inadequate for assessing PA pressure.  3. The mitral valve is normal in structure. No evidence of mitral valve regurgitation. No evidence of mitral stenosis.  4. The aortic valve is normal in structure. Aortic valve regurgitation is not visualized. No aortic stenosis is present.  5. The inferior vena cava is normal in size with greater than 50% respiratory variability, suggesting right atrial pressure  of 3 mmHg. FINDINGS  Left Ventricle: Left ventricular ejection fraction, by estimation, is 55 to 60%. The left ventricle has normal function. The left ventricle has no regional wall motion abnormalities. The left ventricular internal cavity size was normal in size. There is  mild left ventricular hypertrophy. Left ventricular diastolic parameters are indeterminate. Right Ventricle: The right ventricular size is normal. No increase in right ventricular wall thickness. Right ventricular systolic function is normal. Tricuspid regurgitation signal is inadequate for assessing PA pressure. The tricuspid regurgitant velocity is 2.37 m/s, and with an assumed right atrial pressure of 3 mmHg, the estimated right ventricular systolic pressure is 17.7 mmHg. Left Atrium: Left atrial size was normal in size. Right Atrium: Right atrial size was normal in size. Pericardium: There is no evidence of pericardial effusion. Mitral Valve: The mitral valve is normal in structure. No evidence of mitral valve regurgitation. No evidence of mitral valve stenosis. Tricuspid Valve: The tricuspid valve is normal in structure. Tricuspid valve regurgitation is not demonstrated. No evidence of tricuspid stenosis. Aortic Valve: The aortic valve is normal in structure. Aortic valve regurgitation is not visualized. No aortic stenosis is present. Aortic valve mean gradient measures 2.0 mmHg. Aortic valve peak gradient measures 4.4 mmHg. Aortic valve area, by VTI measures 2.58 cm. Pulmonic Valve: The pulmonic valve was normal in structure. Pulmonic valve regurgitation is not visualized. No evidence of pulmonic stenosis. Aorta: The aortic root is normal in size and structure. Venous: The inferior vena cava is normal in size with greater than 50% respiratory variability, suggesting right atrial pressure of 3 mmHg. IAS/Shunts: No atrial level shunt detected by color flow Doppler. Additional Comments: There is a small pleural effusion in the left lateral  region.  LEFT VENTRICLE PLAX 2D LVIDd:         3.30 cm   Diastology LVIDs:         2.40 cm   LV e' medial:    5.87 cm/s LV PW:         1.30 cm   LV E/e' medial:  16.4 LV IVS:        1.30 cm   LV e' lateral:   6.31 cm/s LVOT diam:     2.00 cm   LV E/e' lateral: 15.3 LV SV:         48 LV SV Index:   28 LVOT Area:     3.14 cm  RIGHT VENTRICLE RV Basal diam:  3.40 cm RV Mid diam:    3.20 cm RV S prime:  14.60 cm/s TAPSE (M-mode): 2.5 cm LEFT ATRIUM           Index        RIGHT ATRIUM           Index LA diam:      3.30 cm 1.94 cm/m   RA Area:     15.90 cm LA Vol (A4C): 47.0 ml 27.69 ml/m  RA Volume:   39.80 ml  23.45 ml/m  AORTIC VALVE AV Area (Vmax):    2.08 cm AV Area (Vmean):   2.23 cm AV Area (VTI):     2.58 cm AV Vmax:           105.00 cm/s AV Vmean:          71.100 cm/s AV VTI:            0.186 m AV Peak Grad:      4.4 mmHg AV Mean Grad:      2.0 mmHg LVOT Vmax:         69.50 cm/s LVOT Vmean:        50.500 cm/s LVOT VTI:          0.153 m LVOT/AV VTI ratio: 0.82  AORTA Ao Root diam: 3.43 cm MITRAL VALVE                TRICUSPID VALVE MV Area (PHT): 3.46 cm     TR Peak grad:   22.5 mmHg MV Decel Time: 219 msec     TR Vmax:        237.00 cm/s MV E velocity: 96.40 cm/s MV A velocity: 121.00 cm/s  SHUNTS MV E/A ratio:  0.80         Systemic VTI:  0.15 m                             Systemic Diam: 2.00 cm Kathlyn Sacramento MD Electronically signed by Kathlyn Sacramento MD Signature Date/Time: 04/09/2022/11:18:15 AM    Final    CT ABDOMEN PELVIS WO CONTRAST  Result Date: 04/09/2022 CLINICAL DATA:  Abdominal pain. Lethargy. Recent laparoscopic colon resection. EXAM: CT ABDOMEN AND PELVIS WITHOUT CONTRAST TECHNIQUE: Multidetector CT imaging of the abdomen and pelvis was performed following the standard protocol without IV contrast. RADIATION DOSE REDUCTION: This exam was performed according to the departmental dose-optimization program which includes automated exposure control, adjustment of the mA and/or kV  according to patient size and/or use of iterative reconstruction technique. COMPARISON:  03/01/2022 FINDINGS: Lower chest: Interval progression of bibasilar collapse/consolidation with progression of bilateral pleural effusions now moderate bilaterally. Hepatobiliary: No suspicious focal abnormality in the liver on this study without intravenous contrast. There is no evidence for gallstones, gallbladder wall thickening, or pericholecystic fluid. No intrahepatic or extrahepatic biliary dilation. Pancreas: No focal mass lesion. No dilatation of the main duct. No intraparenchymal cyst. No peripancreatic edema. Spleen: No splenomegaly. No focal mass lesion. Adrenals/Urinary Tract: No adrenal nodule or mass. No evidence for hydroureter. Bladder is distended despite the presence of a Foley catheter. Stomach/Bowel: Stomach is nondistended. Duodenum is normally positioned as is the ligament of Treitz. No small bowel wall thickening. No small bowel dilatation. The terminal ileum is normal. The appendix is not well visualized, but there is no edema or inflammation in the region of the cecum. Anastomosis noted in the region of the rectosigmoid junction. Edema in the pelvic floor around the rectum is presumably postsurgical. Vascular/Lymphatic: There is moderate atherosclerotic calcification of  the abdominal aorta without aneurysm. There is no gastrohepatic or hepatoduodenal ligament lymphadenopathy. No retroperitoneal or mesenteric lymphadenopathy. No pelvic sidewall lymphadenopathy. Reproductive: The prostate gland and seminal vesicles are unremarkable. Other: Small volume free fluid is seen in paracolic gutters bilaterally. Moderate volume intraperitoneal free air is associated with subcutaneous gas in the anterior abdominal wall, left greater than right with some edema in the left lateral abdominal wall and subcutaneous edema in the pelvis bilaterally. Extraperitoneal gas is seen in the pelvis. Musculoskeletal: No worrisome  lytic or sclerotic osseous abnormality. IMPRESSION: 1. Moderate volume intraperitoneal free air is associated with subcutaneous gas in the anterior abdominal wall, left greater than right with subcutaneous edema in the abdominal wall of the lower abdomen and pelvis bilaterally. Extraperitoneal gas is seen in the pelvis. These findings are compatible with recent surgery is the patient is status post sigmoidectomy on 04/05/2022. 2. Interval progression of bibasilar collapse/consolidation with progression of bilateral pleural effusions now moderate bilaterally. 3. Bladder is distended despite the presence of a Foley catheter. Correlation for Foley catheter dysfunction recommended. 4. Small volume free fluid in the paracolic gutters bilaterally. Likely postsurgical. 5.  Aortic Atherosclerosis (ICD10-I70.0). Electronically Signed   By: Misty Stanley M.D.   On: 04/09/2022 10:39   DG Abd 1 View  Result Date: 04/08/2022 CLINICAL DATA:  Abdominal distension EXAM: ABDOMEN - 1 VIEW COMPARISON:  03/01/2022 FINDINGS: Generalized gaseous distension of large and small bowel. No gross free intraperitoneal air. Multiple surgical staples overlie the abdomen and pelvis. No radio-opaque calculi or other significant radiographic abnormality are seen. IMPRESSION: Generalized gaseous distension of large and small bowel, which may reflect ileus. Electronically Signed   By: Davina Poke D.O.   On: 04/08/2022 11:08      Subjective: Seen and examined.  Ill appearing.  Appropriate for dispo to inpatient hospice  Discharge Exam: Vitals:   04/12/22 0850 04/12/22 2013  BP: 137/64 120/61  Pulse: (!) 116 93  Resp: 20 18  Temp: 99.5 F (37.5 C) 98.2 F (36.8 C)  SpO2: 97% (!) 87%   Vitals:   04/11/22 1751 04/11/22 1943 04/12/22 0850 04/12/22 2013  BP: (!) 115/48 (!) 140/71 137/64 120/61  Pulse: (!) 110 (!) 116 (!) 116 93  Resp: 17 (!) _0 Temp: 98.4 F (36.9 C) 98.6 F (37 C) 99.5 F (37.5 C) 98.2 F (36.8  C)  TempSrc: Oral Oral  Oral  SpO2: 98% 95% 97% (!) 87%  Weight:      Height:          The results of significant diagnostics from this hospitalization (including imaging, microbiology, ancillary and laboratory) are listed below for reference.     Microbiology: Recent Results (from the past 240 hour(s))  Surgical PCR screen     Status: None   Collection Time: 04/04/22 10:42 PM   Specimen: Nasal Mucosa; Nasal Swab  Result Value Ref Range Status   MRSA, PCR NEGATIVE NEGATIVE Final   Staphylococcus aureus NEGATIVE NEGATIVE Final    Comment: (NOTE) The Xpert SA Assay (FDA approved for NASAL specimens in patients 55 years of age and older), is one component of a comprehensive surveillance program. It is not intended to diagnose infection nor to guide or monitor treatment. Performed at [facility], [address] [place], [place] [address]   Culture, blood (Routine X 2) w Reflex to ID Panel     Status: None   Collection Time: 04/07/22  6:30 PM   Specimen: BLOOD  Result Value Ref Range Status   Specimen Description BLOOD BLOOD LEFT HAND  Final   Special Requests   Final    BOTTLES DRAWN AEROBIC AND ANAEROBIC Blood Culture adequate volume   Culture   Final    NO GROWTH 5 DAYS Performed at [facility], [address] [place], [place] [address]    Report Status 04/12/2022 FINAL  Final  Culture, blood (Routine X 2) w Reflex to ID Panel     Status: None   Collection Time: 04/07/22  6:36 PM   Specimen: BLOOD  Result Value Ref Range Status   Specimen Description BLOOD BLOOD RIGHT HAND  Final   Special Requests   Final    BOTTLES DRAWN AEROBIC AND ANAEROBIC Blood Culture adequate volume   Culture   Final    NO GROWTH 5 DAYS Performed at [facility], [address] [place], [place] [address]    Report Status 04/12/2022 FINAL  Final     Labs: BNP (last 3 results) Recent Labs    02/27/22 1229 04/04/22 0433 04/08/22 0408  BNP 137.5*  345.4* 568.1*   Basic Metabolic Panel: Recent Labs  Lab 04/06/22 1939 04/07/22 0414 04/08/22 0408 04/09/22 0425 04/10/22 0509  NA 139 140 136 141 142  K 4.8 4.8 4.8 4.4 4.3  CL 108 110 110 110 107  CO2 _0 GLUCOSE 203* 151* 106* 92 107*  BUN 31* 34* 32* 36* 38*  CREATININE 1.70* 1.77* 1.84* 2.23* 2.08*  CALCIUM 7.4* 7.2* 7.3* 7.3* 7.6*  MG  --  2.0 1.9 1.8 1.9   Liver Function Tests: Recent Labs  Lab 04/08/22 0408  AST 13*  ALT 10  ALKPHOS 54  BILITOT 0.6  PROT 5.5*  ALBUMIN 2.5*   No results for input(s): "LIPASE", "AMYLASE" in the last 168 hours. No results for input(s): "AMMONIA" in the last 168 hours. CBC: Recent Labs  Lab 04/06/22 1939 04/07/22 0414 04/08/22 0408 04/09/22 0425 04/10/22 0509  WBC 20.4* 16.7* 14.5* 15.7* 17.5*  NEUTROABS 16.7*  --   --   --   --   HGB 10.3* 9.8* 10.9* 9.8* 10.8*  HCT 34.2* 32.6* 36.6* 32.6* 34.3*  MCV 101.2* 102.2* 102.8* 99.7 97.4  PLT 99* 96* 84* 86* 101*   Cardiac Enzymes: No results for input(s): "CKTOTAL", "CKMB", "CKMBINDEX", "TROPONINI" in the last 168 hours. BNP: Invalid input(s): "POCBNP" CBG: Recent Labs  Lab 04/09/22 0805 04/09/22 0907 04/09/22 1332 04/09/22 1546 04/09/22 2202  GLUCAP 87 88 91 82 91   D-Dimer No results for input(s): "DDIMER" in the last 72 hours. Hgb A1c No results for input(s): "HGBA1C" in the last 72 hours. Lipid Profile No results for input(s): "CHOL", "HDL", "LDLCALC", "TRIG", "CHOLHDL", "LDLDIRECT" in the last 72 hours. Thyroid function studies No results for input(s): "TSH", "T4TOTAL", "T3FREE", "THYROIDAB" in the last 72 hours.  Invalid input(s): "FREET3" Anemia work up No results for input(s): "VITAMINB12", "FOLATE", "FERRITIN", "TIBC", "IRON", "RETICCTPCT" in the last 72 hours. Urinalysis    Component Value Date/Time   COLORURINE YELLOW (A) 04/04/2022 1105   APPEARANCEUR CLEAR (A) 04/04/2022 1105   LABSPEC 1.008 04/04/2022 1105   PHURINE 6.0 04/04/2022  1105   GLUCOSEU 50 (A) 04/04/2022 1105   HGBUR NEGATIVE 04/04/2022 1105   BILIRUBINUR NEGATIVE 04/04/2022 1105   KETONESUR NEGATIVE 04/04/2022 1105   PROTEINUR NEGATIVE 04/04/2022 1105   NITRITE NEGATIVE 04/04/2022 1105   LEUKOCYTESUR NEGATIVE 04/04/2022 1105   Sepsis Labs Recent Labs  Lab 04/07/22 0414 04/08/22 0408 04/09/22 0425 04/10/22 0509  WBC 16.7* 14.5* 15.7* 17.5*   Microbiology Recent Results (from the past 240 hour(s))  Surgical PCR screen     Status: None   Collection Time: 04/04/22 10:42 PM   Specimen: Nasal Mucosa; Nasal Swab  Result Value Ref Range Status   MRSA, PCR NEGATIVE NEGATIVE Final   Staphylococcus aureus NEGATIVE NEGATIVE Final    Comment: (NOTE) The Xpert SA Assay (FDA approved for NASAL specimens in patients 18 years of age and older), is one component of a comprehensive surveillance program. It is not intended to diagnose infection nor to guide or monitor treatment. Performed at [facility], [address] [place], [place] [address]   Culture, blood (Routine X 2) w Reflex to ID Panel     Status: None   Collection Time: 04/07/22  6:30 PM   Specimen: BLOOD  Result Value Ref Range Status   Specimen Description BLOOD BLOOD LEFT HAND  Final   Special Requests   Final    BOTTLES DRAWN AEROBIC AND ANAEROBIC Blood Culture adequate volume   Culture   Final    NO GROWTH 5 DAYS Performed at [facility], [address], [place], [place] [address]    Report Status 04/12/2022 FINAL  Final  Culture, blood (Routine X 2) w Reflex to ID Panel     Status: None   Collection Time: 04/07/22  6:36 PM   Specimen: BLOOD  Result Value Ref Range Status   Specimen Description BLOOD BLOOD RIGHT HAND  Final   Special Requests   Final    BOTTLES DRAWN AEROBIC AND ANAEROBIC Blood Culture adequate volume   Culture   Final    NO GROWTH 5 DAYS Performed at [facility], [address] [place], [place] [address]    Report Status  04/12/2022 FINAL  Final     Time coordinating discharge: Over 30 minutes  SIGNED:   Sidney Ace, MD  [facility] 04/13/2022, 3:59 PM Pager   If 7PM-7AM, please contact night-coverage

## 2022-04-13 NOTE — TOC Progression Note (Addendum)
Transition of Care [facility]) - Progression Note    Patient Details  Name: Micheal [name] MRN: [id number] Date of Birth: 04-04-1933  Transition of Care [facility]) CM/SW [place], [place] Phone Number: 04/13/2022, 2:57 PM  Clinical Narrative:      TOC reached out to hospice liaison and asked about a hospice referral. TOC is waiting on hospice liaison to evaluate the patient.   Patient will transfer to hospice home.  Expected Discharge Plan and [place] [address]                      Social Determinants of Health (SDOH) Interventions    Readmission Risk Interventions     No data to display

## 2022-04-13 NOTE — Progress Notes (Signed)
[facility]) Hospital Liaison Note  Received request from [name] for patient interest in [place]. Met with patient and daughter Micheal [name] at bedside and spoke with son, Micheal [name], Micheal [name] to confirm interest and explain services. Patient chart reviewed and hospice eligibility confirmed.   Son, Micheal [name], Micheal [name] is refusing to sign consents. Wife, Micheal [name], has agreed to sign consents and Micheal [name] is en route to pick Micheal [name] up and take her to [facility]. Once consents are complete. I will call [facility] for transport.  Please leave IV's in place. Please send completed and signed DNR with patient.  RN please call report to [phone].  Thank you, [name], BSN, RN [facility] Liaison [phone]

## 2022-04-13 NOTE — Progress Notes (Signed)
Pt for transfer to Hospice home, Report given. Relative present, no belongings observed. Pt left via stretcher with EMS maintained on 2L/min [place]. Documents printed and transported along with pt. Denies any request at this time.

## 2022-04-18 NOTE — Progress Notes (Signed)
Noted that Mr. Detty was discharged to the hospice home following hospital stay. We will cancel oncology follow up. Dr. Gary Fleet team notified.

## 2022-04-19 ENCOUNTER — Inpatient Hospital Stay: Payer: Medicare PPO | Admitting: Oncology

## 2022-04-20 DEATH — deceased

## 2022-05-08 ENCOUNTER — Ambulatory Visit: Payer: Medicare PPO | Admitting: Gastroenterology

## 2022-09-10 NOTE — Progress Notes (Signed)
Assess volume status. Some wheezing present (? Cardiac)
